# Patient Record
Sex: Female | Born: 1945 | Race: White | Hispanic: No | Marital: Married | Smoking: Former smoker
Health system: Southern US, Community
[De-identification: ages and names within clinical notes are randomized; demographics above are authoritative.]

## PROBLEM LIST (undated history)

## (undated) DIAGNOSIS — A499 Bacterial infection, unspecified: Secondary | ICD-10-CM

## (undated) DIAGNOSIS — K759 Inflammatory liver disease, unspecified: Secondary | ICD-10-CM

## (undated) DIAGNOSIS — Z8619 Personal history of other infectious and parasitic diseases: Secondary | ICD-10-CM

## (undated) DIAGNOSIS — N390 Urinary tract infection, site not specified: Secondary | ICD-10-CM

## (undated) DIAGNOSIS — R519 Headache, unspecified: Secondary | ICD-10-CM

## (undated) DIAGNOSIS — K219 Gastro-esophageal reflux disease without esophagitis: Secondary | ICD-10-CM

## (undated) DIAGNOSIS — J40 Bronchitis, not specified as acute or chronic: Secondary | ICD-10-CM

## (undated) DIAGNOSIS — A389 Scarlet fever, uncomplicated: Secondary | ICD-10-CM

## (undated) DIAGNOSIS — H269 Unspecified cataract: Secondary | ICD-10-CM

## (undated) DIAGNOSIS — Z8719 Personal history of other diseases of the digestive system: Secondary | ICD-10-CM

## (undated) DIAGNOSIS — I10 Essential (primary) hypertension: Secondary | ICD-10-CM

## (undated) DIAGNOSIS — R42 Dizziness and giddiness: Secondary | ICD-10-CM

## (undated) DIAGNOSIS — R51 Headache: Secondary | ICD-10-CM

## (undated) DIAGNOSIS — E785 Hyperlipidemia, unspecified: Secondary | ICD-10-CM

## (undated) DIAGNOSIS — C801 Malignant (primary) neoplasm, unspecified: Secondary | ICD-10-CM

## (undated) DIAGNOSIS — K5792 Diverticulitis of intestine, part unspecified, without perforation or abscess without bleeding: Secondary | ICD-10-CM

## (undated) DIAGNOSIS — N809 Endometriosis, unspecified: Secondary | ICD-10-CM

## (undated) DIAGNOSIS — M1990 Unspecified osteoarthritis, unspecified site: Secondary | ICD-10-CM

## (undated) DIAGNOSIS — H9202 Otalgia, left ear: Secondary | ICD-10-CM

## (undated) DIAGNOSIS — B029 Zoster without complications: Secondary | ICD-10-CM

## (undated) DIAGNOSIS — M25561 Pain in right knee: Secondary | ICD-10-CM

## (undated) DIAGNOSIS — R202 Paresthesia of skin: Secondary | ICD-10-CM

## (undated) DIAGNOSIS — R200 Anesthesia of skin: Secondary | ICD-10-CM

## (undated) DIAGNOSIS — G43909 Migraine, unspecified, not intractable, without status migrainosus: Secondary | ICD-10-CM

## (undated) HISTORY — DX: Personal history of other infectious and parasitic diseases: Z86.19

## (undated) HISTORY — PX: COLON SURGERY: SHX602

## (undated) HISTORY — DX: Unspecified osteoarthritis, unspecified site: M19.90

## (undated) HISTORY — DX: Malignant (primary) neoplasm, unspecified: C80.1

## (undated) HISTORY — DX: Hyperlipidemia, unspecified: E78.5

## (undated) HISTORY — PX: EYE SURGERY: SHX253

## (undated) HISTORY — DX: Diverticulitis of intestine, part unspecified, without perforation or abscess without bleeding: K57.92

## (undated) HISTORY — PX: DIAGNOSTIC LAPAROSCOPY: SUR761

## (undated) HISTORY — PX: TONSILLECTOMY: SHX5217

## (undated) HISTORY — DX: Gastro-esophageal reflux disease without esophagitis: K21.9

## (undated) HISTORY — DX: Endometriosis, unspecified: N80.9

## (undated) HISTORY — PX: APPENDECTOMY: SHX54

## (undated) HISTORY — DX: Zoster without complications: B02.9

## (undated) HISTORY — DX: Pain in right knee: M25.561

## (undated) HISTORY — DX: Dizziness and giddiness: R42

## (undated) HISTORY — PX: CATARACT EXTRACTION W/ INTRAOCULAR LENS IMPLANT: SHX1309

## (undated) HISTORY — PX: OOPHORECTOMY: SHX86

## (undated) HISTORY — PX: TONSILLECTOMY: SUR1361

## (undated) HISTORY — DX: Otalgia, left ear: H92.02

## (undated) HISTORY — DX: Essential (primary) hypertension: I10

---

## 1992-08-25 DIAGNOSIS — N809 Endometriosis, unspecified: Secondary | ICD-10-CM

## 1992-08-25 HISTORY — DX: Endometriosis, unspecified: N80.9

## 1992-08-25 HISTORY — PX: ABDOMINAL HYSTERECTOMY: SHX81

## 1994-08-25 HISTORY — PX: LUNG BIOPSY: SHX232

## 1999-06-17 ENCOUNTER — Other Ambulatory Visit: Admission: RE | Admit: 1999-06-17 | Discharge: 1999-06-17 | Payer: Self-pay | Admitting: Obstetrics & Gynecology

## 2000-11-04 ENCOUNTER — Other Ambulatory Visit: Admission: RE | Admit: 2000-11-04 | Discharge: 2000-11-04 | Payer: Self-pay | Admitting: Obstetrics & Gynecology

## 2001-12-17 ENCOUNTER — Other Ambulatory Visit: Admission: RE | Admit: 2001-12-17 | Discharge: 2001-12-17 | Payer: Self-pay | Admitting: Obstetrics & Gynecology

## 2002-10-25 ENCOUNTER — Encounter: Admission: RE | Admit: 2002-10-25 | Discharge: 2002-10-25 | Payer: Self-pay | Admitting: Family Medicine

## 2002-10-25 ENCOUNTER — Encounter: Payer: Self-pay | Admitting: Family Medicine

## 2003-01-10 ENCOUNTER — Other Ambulatory Visit: Admission: RE | Admit: 2003-01-10 | Discharge: 2003-01-10 | Payer: Self-pay | Admitting: Obstetrics & Gynecology

## 2004-02-01 ENCOUNTER — Other Ambulatory Visit: Admission: RE | Admit: 2004-02-01 | Discharge: 2004-02-01 | Payer: Self-pay | Admitting: Obstetrics & Gynecology

## 2004-08-25 LAB — HM COLONOSCOPY

## 2004-09-19 ENCOUNTER — Ambulatory Visit: Payer: Self-pay | Admitting: Internal Medicine

## 2004-10-29 ENCOUNTER — Ambulatory Visit: Payer: Self-pay | Admitting: Internal Medicine

## 2004-11-13 ENCOUNTER — Ambulatory Visit: Payer: Self-pay | Admitting: Gastroenterology

## 2004-11-23 DIAGNOSIS — K5792 Diverticulitis of intestine, part unspecified, without perforation or abscess without bleeding: Secondary | ICD-10-CM

## 2004-11-23 HISTORY — DX: Diverticulitis of intestine, part unspecified, without perforation or abscess without bleeding: K57.92

## 2004-11-25 ENCOUNTER — Ambulatory Visit: Payer: Self-pay | Admitting: Gastroenterology

## 2004-11-25 ENCOUNTER — Encounter: Payer: Self-pay | Admitting: Internal Medicine

## 2005-12-30 ENCOUNTER — Ambulatory Visit: Payer: Self-pay | Admitting: Internal Medicine

## 2006-02-10 ENCOUNTER — Ambulatory Visit: Payer: Self-pay | Admitting: Internal Medicine

## 2006-04-14 ENCOUNTER — Encounter: Admission: RE | Admit: 2006-04-14 | Discharge: 2006-04-14 | Payer: Self-pay | Admitting: Obstetrics and Gynecology

## 2006-10-20 ENCOUNTER — Ambulatory Visit: Payer: Self-pay | Admitting: Internal Medicine

## 2007-05-05 ENCOUNTER — Encounter: Admission: RE | Admit: 2007-05-05 | Discharge: 2007-05-05 | Payer: Self-pay | Admitting: Obstetrics and Gynecology

## 2007-09-13 ENCOUNTER — Ambulatory Visit: Payer: Self-pay | Admitting: Internal Medicine

## 2007-09-27 ENCOUNTER — Telehealth (INDEPENDENT_AMBULATORY_CARE_PROVIDER_SITE_OTHER): Payer: Self-pay | Admitting: *Deleted

## 2007-09-30 ENCOUNTER — Ambulatory Visit: Payer: Self-pay | Admitting: Cardiology

## 2007-10-04 ENCOUNTER — Telehealth: Payer: Self-pay | Admitting: Internal Medicine

## 2008-05-16 ENCOUNTER — Encounter: Admission: RE | Admit: 2008-05-16 | Discharge: 2008-05-16 | Payer: Self-pay | Admitting: Obstetrics and Gynecology

## 2008-05-18 ENCOUNTER — Other Ambulatory Visit: Admission: RE | Admit: 2008-05-18 | Discharge: 2008-05-18 | Payer: Self-pay | Admitting: Obstetrics and Gynecology

## 2008-12-13 ENCOUNTER — Ambulatory Visit: Payer: Self-pay | Admitting: Family Medicine

## 2009-01-11 ENCOUNTER — Ambulatory Visit: Payer: Self-pay | Admitting: Internal Medicine

## 2009-01-12 ENCOUNTER — Encounter: Payer: Self-pay | Admitting: Internal Medicine

## 2009-01-12 ENCOUNTER — Telehealth: Payer: Self-pay | Admitting: Internal Medicine

## 2009-01-12 LAB — CONVERTED CEMR LAB
Albumin: 4 g/dL (ref 3.5–5.2)
Alkaline Phosphatase: 68 units/L (ref 39–117)
Basophils Absolute: 0 10*3/uL (ref 0.0–0.1)
Basophils Relative: 0.6 % (ref 0.0–3.0)
Bilirubin, Direct: 0.1 mg/dL (ref 0.0–0.3)
Calcium: 9.2 mg/dL (ref 8.4–10.5)
Chloride: 103 meq/L (ref 96–112)
Cholesterol: 214 mg/dL — ABNORMAL HIGH (ref 0–200)
Creatinine, Ser: 0.8 mg/dL (ref 0.4–1.2)
Direct LDL: 160.8 mg/dL
Eosinophils Relative: 1.4 % (ref 0.0–5.0)
GFR calc non Af Amer: 77.04 mL/min (ref >60)
HCT: 40.4 % (ref 36.0–46.0)
MCHC: 34 g/dL (ref 30.0–36.0)
MCV: 94.6 fL (ref 78.0–100.0)
Neutro Abs: 5 10*3/uL (ref 1.4–7.7)
Neutrophils Relative %: 60.8 % (ref 43.0–77.0)
Potassium: 3.9 meq/L (ref 3.5–5.1)
RBC: 4.27 M/uL (ref 3.87–5.11)
Sodium: 141 meq/L (ref 135–145)
Total Bilirubin: 0.7 mg/dL (ref 0.3–1.2)
Total CHOL/HDL Ratio: 6
VLDL: 29.8 mg/dL (ref 0.0–40.0)
WBC: 8.2 10*3/uL (ref 4.5–10.5)

## 2009-01-17 ENCOUNTER — Ambulatory Visit: Payer: Self-pay | Admitting: Gastroenterology

## 2009-01-18 ENCOUNTER — Ambulatory Visit: Payer: Self-pay | Admitting: Internal Medicine

## 2009-01-19 ENCOUNTER — Encounter: Admission: RE | Admit: 2009-01-19 | Discharge: 2009-01-19 | Payer: Self-pay | Admitting: Internal Medicine

## 2009-01-26 ENCOUNTER — Encounter: Admission: RE | Admit: 2009-01-26 | Discharge: 2009-01-26 | Payer: Self-pay | Admitting: Internal Medicine

## 2009-01-30 ENCOUNTER — Ambulatory Visit: Payer: Self-pay | Admitting: Internal Medicine

## 2009-02-01 ENCOUNTER — Telehealth: Payer: Self-pay | Admitting: Internal Medicine

## 2009-02-20 ENCOUNTER — Encounter: Payer: Self-pay | Admitting: Internal Medicine

## 2009-02-22 ENCOUNTER — Encounter: Payer: Self-pay | Admitting: Gastroenterology

## 2009-03-06 ENCOUNTER — Encounter: Payer: Self-pay | Admitting: Internal Medicine

## 2009-04-11 ENCOUNTER — Ambulatory Visit (HOSPITAL_COMMUNITY): Admission: RE | Admit: 2009-04-11 | Discharge: 2009-04-11 | Payer: Self-pay | Admitting: Urology

## 2009-04-12 ENCOUNTER — Encounter: Payer: Self-pay | Admitting: Internal Medicine

## 2009-04-16 ENCOUNTER — Encounter: Payer: Self-pay | Admitting: Gastroenterology

## 2009-04-16 ENCOUNTER — Inpatient Hospital Stay (HOSPITAL_COMMUNITY): Admission: RE | Admit: 2009-04-16 | Discharge: 2009-04-19 | Payer: Self-pay | Admitting: Urology

## 2009-04-16 ENCOUNTER — Encounter: Payer: Self-pay | Admitting: Urology

## 2009-04-16 DIAGNOSIS — C801 Malignant (primary) neoplasm, unspecified: Secondary | ICD-10-CM

## 2009-04-16 HISTORY — PX: NEPHRECTOMY: SHX65

## 2009-04-16 HISTORY — DX: Malignant (primary) neoplasm, unspecified: C80.1

## 2009-04-18 ENCOUNTER — Encounter (INDEPENDENT_AMBULATORY_CARE_PROVIDER_SITE_OTHER): Payer: Self-pay | Admitting: *Deleted

## 2009-04-24 ENCOUNTER — Encounter (INDEPENDENT_AMBULATORY_CARE_PROVIDER_SITE_OTHER): Payer: Self-pay | Admitting: *Deleted

## 2009-04-26 ENCOUNTER — Encounter: Payer: Self-pay | Admitting: Internal Medicine

## 2009-05-03 ENCOUNTER — Encounter: Payer: Self-pay | Admitting: Internal Medicine

## 2009-05-03 ENCOUNTER — Ambulatory Visit (HOSPITAL_COMMUNITY): Admission: RE | Admit: 2009-05-03 | Discharge: 2009-05-03 | Payer: Self-pay | Admitting: Urology

## 2009-05-15 ENCOUNTER — Encounter: Payer: Self-pay | Admitting: Internal Medicine

## 2009-05-25 ENCOUNTER — Encounter: Payer: Self-pay | Admitting: Internal Medicine

## 2009-05-25 ENCOUNTER — Encounter: Payer: Self-pay | Admitting: Gastroenterology

## 2009-06-21 ENCOUNTER — Ambulatory Visit: Payer: Self-pay | Admitting: Gastroenterology

## 2009-06-21 LAB — CONVERTED CEMR LAB
A-1 Antitrypsin, Ser: 176 mg/dL (ref 83–200)
ALT: 276 units/L — ABNORMAL HIGH (ref 0–35)
ANA Titer 1: 1:640 {titer} — ABNORMAL HIGH
AST: 635 units/L — ABNORMAL HIGH (ref 0–37)
Anti Nuclear Antibody(ANA): POSITIVE — AB
Hep B S Ab: NEGATIVE
Hepatitis B Surface Ag: NEGATIVE
Total Bilirubin: 0.8 mg/dL (ref 0.3–1.2)
Total Protein: 8.9 g/dL — ABNORMAL HIGH (ref 6.0–8.3)
Transferrin: 358.2 mg/dL (ref 212.0–360.0)

## 2009-06-25 ENCOUNTER — Encounter: Payer: Self-pay | Admitting: Gastroenterology

## 2009-06-25 LAB — CONVERTED CEMR LAB
Albumin ELP: 43.7 % — ABNORMAL LOW (ref 55.8–66.1)
Alpha-1-Globulin: 6.6 % — ABNORMAL HIGH (ref 2.9–4.9)
Alpha-2-Globulin: 8.8 % (ref 7.1–11.8)
IgG (Immunoglobin G), Serum: 3250 mg/dL — ABNORMAL HIGH (ref 694–1618)

## 2009-06-28 ENCOUNTER — Ambulatory Visit: Payer: Self-pay | Admitting: Gastroenterology

## 2009-06-28 LAB — CONVERTED CEMR LAB
INR: 1.2 — ABNORMAL HIGH (ref 0.8–1.0)
Prothrombin Time: 12.8 s — ABNORMAL HIGH (ref 9.1–11.7)

## 2009-07-05 ENCOUNTER — Ambulatory Visit: Payer: Self-pay | Admitting: Gastroenterology

## 2009-07-26 ENCOUNTER — Encounter (INDEPENDENT_AMBULATORY_CARE_PROVIDER_SITE_OTHER): Payer: Self-pay | Admitting: *Deleted

## 2009-09-13 ENCOUNTER — Telehealth: Payer: Self-pay | Admitting: Gastroenterology

## 2009-09-14 ENCOUNTER — Encounter: Payer: Self-pay | Admitting: Gastroenterology

## 2009-09-18 ENCOUNTER — Telehealth: Payer: Self-pay | Admitting: Gastroenterology

## 2009-10-12 ENCOUNTER — Telehealth: Payer: Self-pay | Admitting: Gastroenterology

## 2009-10-22 ENCOUNTER — Ambulatory Visit: Payer: Self-pay | Admitting: Internal Medicine

## 2009-10-23 LAB — CONVERTED CEMR LAB
AST: 79 units/L — ABNORMAL HIGH (ref 0–37)
Basophils Relative: 0.1 % (ref 0.0–3.0)
Bilirubin, Direct: 0.2 mg/dL (ref 0.0–0.3)
Eosinophils Absolute: 0.1 10*3/uL (ref 0.0–0.7)
Eosinophils Relative: 0.7 % (ref 0.0–5.0)
HCT: 36.2 % (ref 36.0–46.0)
Hemoglobin: 12.1 g/dL (ref 12.0–15.0)
Lymphocytes Relative: 14.3 % (ref 12.0–46.0)
Lymphs Abs: 1.4 10*3/uL (ref 0.7–4.0)
MCHC: 33.4 g/dL (ref 30.0–36.0)
Monocytes Absolute: 1 10*3/uL (ref 0.1–1.0)
Monocytes Relative: 10.6 % (ref 3.0–12.0)
Neutrophils Relative %: 74.3 % (ref 43.0–77.0)
Total Protein: 8 g/dL (ref 6.0–8.3)

## 2009-11-08 ENCOUNTER — Encounter: Admission: RE | Admit: 2009-11-08 | Discharge: 2009-11-08 | Payer: Self-pay | Admitting: Obstetrics and Gynecology

## 2009-11-22 ENCOUNTER — Encounter: Admission: RE | Admit: 2009-11-22 | Discharge: 2009-11-22 | Payer: Self-pay | Admitting: Obstetrics and Gynecology

## 2009-11-23 ENCOUNTER — Ambulatory Visit: Payer: Self-pay | Admitting: Gastroenterology

## 2009-11-23 ENCOUNTER — Ambulatory Visit (HOSPITAL_COMMUNITY): Admission: RE | Admit: 2009-11-23 | Discharge: 2009-11-23 | Payer: Self-pay | Admitting: Gastroenterology

## 2009-11-27 ENCOUNTER — Telehealth: Payer: Self-pay | Admitting: Gastroenterology

## 2009-12-03 ENCOUNTER — Ambulatory Visit: Payer: Self-pay | Admitting: Gastroenterology

## 2009-12-04 ENCOUNTER — Encounter: Payer: Self-pay | Admitting: Internal Medicine

## 2009-12-04 ENCOUNTER — Ambulatory Visit (HOSPITAL_COMMUNITY): Admission: RE | Admit: 2009-12-04 | Discharge: 2009-12-04 | Payer: Self-pay | Admitting: Urology

## 2010-01-11 ENCOUNTER — Encounter: Payer: Self-pay | Admitting: Internal Medicine

## 2010-01-23 ENCOUNTER — Encounter: Payer: Self-pay | Admitting: Gastroenterology

## 2010-03-14 ENCOUNTER — Encounter: Payer: Self-pay | Admitting: Internal Medicine

## 2010-03-15 ENCOUNTER — Ambulatory Visit: Payer: Self-pay | Admitting: Internal Medicine

## 2010-04-17 ENCOUNTER — Encounter: Payer: Self-pay | Admitting: Internal Medicine

## 2010-05-27 ENCOUNTER — Telehealth: Payer: Self-pay | Admitting: Gastroenterology

## 2010-06-03 ENCOUNTER — Encounter: Payer: Self-pay | Admitting: Gastroenterology

## 2010-06-04 ENCOUNTER — Encounter: Payer: Self-pay | Admitting: Internal Medicine

## 2010-06-07 ENCOUNTER — Telehealth: Payer: Self-pay | Admitting: Gastroenterology

## 2010-06-11 ENCOUNTER — Telehealth: Payer: Self-pay | Admitting: Gastroenterology

## 2010-06-13 ENCOUNTER — Ambulatory Visit: Payer: Self-pay | Admitting: Gastroenterology

## 2010-06-13 LAB — CONVERTED CEMR LAB
Basophils Relative: 0.5 % (ref 0.0–3.0)
CO2: 29 meq/L (ref 19–32)
Calcium: 8.8 mg/dL (ref 8.4–10.5)
Creatinine, Ser: 0.7 mg/dL (ref 0.4–1.2)
HCT: 38.7 % (ref 36.0–46.0)
Hemoglobin: 13.1 g/dL (ref 12.0–15.0)
INR: 1 (ref 0.8–1.0)
Lymphocytes Relative: 28.5 % (ref 12.0–46.0)
Lymphs Abs: 2.3 10*3/uL (ref 0.7–4.0)
Monocytes Absolute: 0.9 10*3/uL (ref 0.1–1.0)
Monocytes Relative: 11.1 % (ref 3.0–12.0)
Neutro Abs: 4.7 10*3/uL (ref 1.4–7.7)
Potassium: 4.7 meq/L (ref 3.5–5.1)
RBC: 4.13 M/uL (ref 3.87–5.11)
Sodium: 140 meq/L (ref 135–145)
Total Bilirubin: 0.7 mg/dL (ref 0.3–1.2)
Total Protein, Serum Electrophoresis: 7.7 g/dL (ref 6.0–8.3)
Total Protein: 7.4 g/dL (ref 6.0–8.3)
WBC: 7.9 10*3/uL (ref 4.5–10.5)
aPTT: 31 s — ABNORMAL HIGH

## 2010-06-19 ENCOUNTER — Telehealth: Payer: Self-pay | Admitting: Gastroenterology

## 2010-06-26 ENCOUNTER — Ambulatory Visit: Payer: Self-pay | Admitting: Internal Medicine

## 2010-06-26 ENCOUNTER — Telehealth: Payer: Self-pay | Admitting: Gastroenterology

## 2010-07-30 ENCOUNTER — Telehealth: Payer: Self-pay | Admitting: Gastroenterology

## 2010-08-06 ENCOUNTER — Encounter: Payer: Self-pay | Admitting: Gastroenterology

## 2010-08-06 ENCOUNTER — Encounter: Payer: Self-pay | Admitting: Internal Medicine

## 2010-08-29 ENCOUNTER — Encounter: Payer: Self-pay | Admitting: Internal Medicine

## 2010-09-15 ENCOUNTER — Encounter: Payer: Self-pay | Admitting: Obstetrics and Gynecology

## 2010-09-15 ENCOUNTER — Encounter: Payer: Self-pay | Admitting: Gastroenterology

## 2010-09-19 ENCOUNTER — Telehealth: Payer: Self-pay | Admitting: Gastroenterology

## 2010-09-19 ENCOUNTER — Ambulatory Visit (HOSPITAL_COMMUNITY)
Admission: RE | Admit: 2010-09-19 | Discharge: 2010-09-19 | Payer: Self-pay | Source: Home / Self Care | Attending: Urology | Admitting: Urology

## 2010-09-24 ENCOUNTER — Encounter: Payer: Self-pay | Admitting: Gastroenterology

## 2010-09-26 NOTE — Progress Notes (Signed)
Summary: Pt. Rescheduled Liver Biopsy   Phone Note Call from Patient Call back at [phone]   Caller: Patient Call For: Dr. Arlyce Dice Reason for Call: Talk to Nurse Summary of Call: Pt. has an appt. for a liver biopsy at [facility] on Tuesday and has the flu. She said she may need to r/s appt. Initial call taken by: Karna Christmas,  October 12, 2009 10:42 AM  Follow-up for Phone Call        Pt. rescheudled Liver Bx. to 11-14-09 at 7:45am at [facility]. All instructions updated with her by phone. Pt. instructed to call back as needed.  Follow-up by: Laureen Ochs LPN,  October 12, 2009 11:11 AM  Additional Follow-up for Phone Call Additional follow up Details #1::        ok Additional Follow-up by: Louis Meckel MD,  October 12, 2009 3:26 PM

## 2010-09-26 NOTE — Progress Notes (Signed)
Summary: Lab work   Phone Note Call from Patient Call back at Work Phone [phone]   Caller: Patient Call For: Dr. Arlyce Dice Reason for Call: Talk to Nurse Summary of Call: Asking to speak w/CMA about Lab work Initial call taken by: Karna Christmas,  June 11, 2010 2:36 PM  Follow-up for Phone Call        Tried to return pts call, No answer Follow-up by: Merri Ray CMA Duncan Dull),  June 11, 2010 3:10 PM  Additional Follow-up for Phone Call Additional follow up Details #1::        Returned pts call, and also give her the information that Dr Arlyce Dice advised. She stated she already had all those labs drawn and would bring a copy to her appointment. Scheduled her to see Dr Arlyce Dice, tomorrow on 06/13/2010 at 10:15am. Has alot of questions about her test results.  Will talk with Dr Arlyce Dice tomorrow Additional Follow-up by: Merri Ray CMA Duncan Dull),  June 12, 2010 9:41 AM    Additional Follow-up for Phone Call Additional follow up Details #2::    Dr Arlyce Dice, Lorain Childes Follow-up by: Merri Ray CMA Duncan Dull),  June 12, 2010 9:41 AM  Additional Follow-up for Phone Call Additional follow up Details #3:: Details for Additional Follow-up Action Taken: ok Additional Follow-up by: Louis Meckel MD,  June 12, 2010 11:13 AM

## 2010-09-26 NOTE — Progress Notes (Signed)
Summary: Biopsy results   Phone Note Call from Patient Call back at Work Phone [phone]   Caller: Patient Call For: Dr. Arlyce Dice Reason for Call: Lab or Test Results Summary of Call: calling about biopsy results Initial call taken by: Karna Christmas,  November 27, 2009 4:26 PM  Follow-up for Phone Call        Pt. made aware biopsy not back yet.  Teryl Lucy RN  November 27, 2009 4:38 PM   Pt. is aware her Liver bx. report isn't complete, I did call Pathology to verify. Pt. will see Dr.Kaplan on 12-03-09 at 8:30am, report will be reviewed with pt. at that time. Pt. instructed to call back as needed.  Follow-up by: Laureen Ochs LPN,  November 28, 2009 10:13 AM

## 2010-09-26 NOTE — Progress Notes (Signed)
Summary: Nexium  Medications Added NEXIUM 40 MG CPDR (ESOMEPRAZOLE MAGNESIUM) 1 by mouth once daily       Phone Note Call from Patient Call back at [phone]  (cell)   Caller: Patient Call For: Dr. Arlyce Dice Reason for Call: Talk to Nurse Summary of Call: pt would like rx for Nexium... samples have helped... [facility] [address] Initial call taken by: Vallarie Mare,  September 18, 2009 1:16 PM  Follow-up for Phone Call        called pt to inform sent in Nexium, no answer, [facility] Follow-up by: Merri Ray CMA Duncan Dull),  September 18, 2009 1:35 PM    New/Updated Medications: NEXIUM 40 MG CPDR (ESOMEPRAZOLE MAGNESIUM) 1 by mouth once daily Prescriptions: NEXIUM 40 MG CPDR (ESOMEPRAZOLE MAGNESIUM) 1 by mouth once daily  #30 x 6   Entered by:   Merri Ray CMA (AAMA)   Authorized by:   Louis Meckel MD   Signed by:   Merri Ray CMA (AAMA) on 09/18/2009   Method used:   [facility]  [address] [phone][facility] (retail)       [address]       [place], [place]  [id number]       Ph: [phone] or [phone]       Fax: [phone]   RxID:   [id number]

## 2010-09-26 NOTE — Letter (Signed)
Summary: [facility]   Imported By: Sherian Rein 05/03/2010 13:54:29  _____________________________________________________________________  External Attachment:    Type:   Image     Comment:   External Document

## 2010-09-26 NOTE — Progress Notes (Signed)
Summary: Liver Biopsy Scheduled   Phone Note Call from Patient Call back at [phone]   Caller: Patient Call For: Dr. Arlyce Dice Reason for Call: Talk to Nurse Summary of Call: pt says Dr. Arlyce Dice instructed her to call [facility] Pound to sch a liver biopsy Initial call taken by: Vallarie Mare,  September 13, 2009 3:24 PM  Follow-up for Phone Call        Pt. has scheduled the Liver biopsy for 10-16-09 at 7:45am at [facility]. All instructions reviewed with her by phone and mailed to her. Pt. instructed to call back as needed.  Follow-up by: Laureen Ochs LPN,  September 13, 2009 3:52 PM

## 2010-09-26 NOTE — Medication Information (Signed)
Summary: Approved/medco  Approved/[facility]   Imported By: Lester Hilltop 01/29/2010 08:03:48  _____________________________________________________________________  External Attachment:    Type:   Image     Comment:   External Document

## 2010-09-26 NOTE — Progress Notes (Signed)
Summary: Questions   Phone Note Call from Patient Call back at Work Phone [phone]   Call For: Dr Arlyce Dice Reason for Call: Talk to Nurse Summary of Call: Has some questions bout her referral to [facility].  Initial call taken by: Leanor Kail [facility],  July 30, 2010 9:17 AM  Follow-up for Phone Call        Spoke with patient, she wanted to make sure that her records had been sent to [facility]. Informed patient that her records had been faxed to [facility] for her appointment. Follow-up by: Selinda Michaels RN,  July 30, 2010 9:33 AM

## 2010-09-26 NOTE — Letter (Signed)
Summary: [facility]  [facility]   Imported By: Maryln Gottron 02/06/2010 14:55:33  _____________________________________________________________________  External Attachment:    Type:   Image     Comment:   External Document

## 2010-09-26 NOTE — Assessment & Plan Note (Signed)
Summary: F/U ON LIVER BIOPSY               Brenda [name]    History of Present Illness Visit Type: Follow-up Visit Primary GI MD: Melvia Heaps MD [facility] Primary Provider: Birdie Sons MD Requesting Provider: n/a Chief Complaint: Liver biopsy History of Present Illness:   Brenda [name] has returned following percutaneous liver biopsy.  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  There is no scarring or other permanent changes. She has no complaints.   GI Review of Systems      Denies abdominal pain, acid reflux, belching, bloating, chest pain, dysphagia with liquids, dysphagia with solids, heartburn, loss of appetite, nausea, vomiting, vomiting blood, weight loss, and  weight gain.        Denies anal fissure, black tarry stools, change in bowel habit, constipation, diarrhea, diverticulosis, fecal incontinence, heme positive stool, hemorrhoids, irritable bowel syndrome, jaundice, light color stool, liver problems, rectal bleeding, and  rectal pain.    Current Medications (verified): 1)  Vivelle-Dot 0.075 Mg/24hr Pttw (Estradiol) .... As Directed 2)  Vitamin D (Ergocalciferol) 50000 Unit Caps (Ergocalciferol) .Marland Kitchen.. 1 Per Week 3)  Aspirin Adult Low Strength 81 Mg Tbec (Aspirin) .... One Tab Two Times A Day 4)  Gnp One Daily Womens 50+  Tabs (Multiple Vitamins-Minerals) .... Once Daily 5)  Oscal 500/200 D-3 500-200 Mg-Unit Tabs (Calcium-Vitamin D) .... Once Daily 6)  Vagifem 10 Mcg Tabs (Estradiol) .... As Directed 7)  Nexium 40 Mg Cpdr (Esomeprazole Magnesium) .Marland Kitchen.. 1 By Mouth Once Daily  Allergies (verified): 1)  ! Macrobid 2)  ! Codeine  Past History:  Past Medical History: Reviewed history from 01/17/2009 and no changes required. arthritis Bursitis Hyperlipidemia  Past Surgical History: Reviewed history from 06/21/2009 and no changes required. Hysterectomy Oophorectomy Tonsillectomy Nephrectomy -1/3 removed  Family History: Reviewed history from 07/05/2009  and no changes required. mother--90 living (hx of thyroid ca) father---deceased 79 yo "blood cancer" brother deceased MVA No FH of Colon Cancer:  Social History: Reviewed history from 06/21/2009 and no changes required. Former Smoker-quit 1986 Alcohol Use - yes rare Illicit Drug Use - no Patient gets regular exercise. Occupation: Retired  Review of Systems       The patient complains of allergy/sinus, arthritis/joint pain, and sore throat.  The patient denies anemia, anxiety-new, back pain, blood in urine, breast changes/lumps, change in vision, confusion, cough, coughing up blood, depression-new, fainting, fatigue, fever, headaches-new, hearing problems, heart murmur, heart rhythm changes, itching, menstrual pain, muscle pains/cramps, night sweats, nosebleeds, pregnancy symptoms, shortness of breath, skin rash, sleeping problems, swelling of feet/legs, swollen lymph glands, thirst - excessive , urination - excessive , urination changes/pain, urine leakage, vision changes, and voice change.    Vital Signs:  Patient profile:   65 year old female Height:      63 inches Weight:      157 pounds BMI:     27.91 Pulse rate:   72 / minute Pulse rhythm:   regular BP sitting:   116 / 70  (left arm) Cuff size:   regular  Vitals Entered By: June McMurray CMA Duncan Dull) (December 03, 2009 8:30 AM)   Impression & Recommendations:  Problem # 1:  NONSPECIFIC ABNORMAL RESULTS LIVR FUNCTION STUDY (ICD-794.8) She has a nonspecific mild chronic hepatitis.  Etiology has not been determined.  Most importantly, it  is not causing permanent liver damage.  No further workup is required.  Recommendations #1 repeat LFTs in 6 and 12 months.  She will  do that at Dr. Cato Mulligan office  Patient Instructions: 1)  You are due for follow up Liver Function Test in 6 Months, we will call you to remind you. 2)  Copy sent to : Birdie Sons MD 3)  The medication list was reviewed and reconciled.  All changed / newly  prescribed medications were explained.  A complete medication list was provided to the patient / caregiver.

## 2010-09-26 NOTE — Letter (Signed)
Summary: [facility]  [facility]   Imported By: Maryln Gottron 12/12/2009 13:14:42  _____________________________________________________________________  External Attachment:    Type:   Image     Comment:   External Document

## 2010-09-26 NOTE — Assessment & Plan Note (Signed)
Summary: acute per PA urology/et   Vital Signs:  Patient profile:   65 year old female Weight:      163 pounds Temp:     98.2 degrees F oral Pulse rate:   84 / minute Pulse rhythm:   regular Resp:     14 per minute BP sitting:   136 / 84  (left arm) Cuff size:   regular  Vitals Entered By: Gladis Riffle, RN (March 15, 2010 8:21 AM) CC: sent by urology PA--was on sulfa for UTI but got joint pain--changed to cephalexin and joint pain some better but began swelling feet and ankles 03/01/10 Is Patient Diabetic? No Comments urine clear yesterday, some labs done   Primary Care Provider:  Birdie Sons MD  CC:  sent by urology PA--was on sulfa for UTI but got joint pain--changed to cephalexin and joint pain some better but began swelling feet and ankles 03/01/10.  History of Present Illness: complicated interesting story as follows: 08/25/09 developed chills. temp 102.6 the following day---thought to be kidney infection. GYN called in septra. 2-3 days later developed joints and muscle aches. stopped septra, started keflex 2 days later feet swelling (no pain). Joint pains 9wrist, knees, hands are better but she continues to have hand pain to th epoint that making a fist is difficult.  saw a urologist 03/14/10, labs drawn---see results. 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 As Directed 2)  Vitamin D  (Ergocalciferol) 50000 Unit Caps (Ergocalciferol) .Marland Kitchen.. 1 Per Month 3)  Aspirin Adult Low Strength 81 Mg Tbec (Aspirin) .... Once Daily 4)  Gnp One Daily Womens 50+  Tabs (Multiple Vitamins-Minerals) .... Once Daily 5)  Oscal 500/200 D-3 500-200 Mg-Unit Tabs (Calcium-Vitamin D) .... Once Daily 6)  Vagifem 10 Mcg Tabs (Estradiol) .... As Directed 7)  Nexium 40 Mg Cpdr (Esomeprazole Magnesium) .Marland Kitchen.. 1 By Mouth Once Daily  Allergies: 1)  ! Macrobid 2)  ! Codeine  Past History:  Past Medical History: Last updated: 01/17/2009 arthritis Bursitis Hyperlipidemia  Past Surgical History: Last updated: 06/21/2009 Hysterectomy Oophorectomy Tonsillectomy Nephrectomy -1/3 removed  Family History: Last updated: 07/05/2009 mother--90 living (hx of thyroid ca) father---deceased 75 yo "blood cancer" brother deceased MVA No FH of Colon Cancer:  Social History: Last updated: 06/21/2009 Former Smoker-quit 1986 Alcohol Use - yes rare Illicit Drug Use - no Patient gets regular exercise. Occupation: Retired  Risk Factors: Exercise: yes (01/17/2009)  Risk Factors: Smoking Status: quit > 6 months (03/15/2010) Packs/Day: 1.0 (03/15/2010)  Physical Exam  General:  Well-developed,well-nourished,in no acute distress; alert,appropriate and cooperative throughout examination Head:  normocephalic and atraumatic.   Eyes:  pupils equal and pupils round.   Ears:  R ear normal and L ear normal.   Neck:  supple and full ROM.   Lungs:  normal respiratory effort and no intercostal retractions.   Heart:  normal rate and regular rhythm.   Msk:  FROM wrists,  knees, elbows Extremities:  1 + edema of feet bilaterally Neurologic:  alert & oriented X3 and cranial nerves II-XII intact.     Impression & Recommendations:  Problem # 1:  MYALGIA (ICD-729.1) interesting story of fever followed by muscle and joint aches now with some swelling of feet  i suspect all related to original febrile illness and  expect sxs to resolve sxs could be related to antibiotic reaction---but i doubt it. we will call her Wed to check on her  labs reviewed Her updated medication list for this problem includes:    Aspirin Adult Low Strength 81 Mg Tbec (Aspirin) ..... Once daily  Complete Medication List: 1)  Vivelle-dot 0.075 Mg/24hr Pttw (Estradiol) .... As directed 2)  Vitamin D (ergocalciferol) 50000 Unit Caps (Ergocalciferol) .Marland Kitchen.. 1 per month 3)  Aspirin Adult Low Strength 81 Mg Tbec (Aspirin) .... Once daily 4)  Gnp One Daily Womens 50+ Tabs (Multiple vitamins-minerals) .... Once daily 5)  Oscal 500/200 D-3 500-200 Mg-unit Tabs (Calcium-vitamin d) .... Once daily 6)  Vagifem 10 Mcg Tabs (Estradiol) .... As directed 7)  Nexium 40 Mg Cpdr (Esomeprazole magnesium) .Marland Kitchen.. 1 by mouth once daily 8)  Lidocaine Hcl 2 % Gel (Lidocaine hcl) .... Apply to hands two times a day as needed hand pain Prescriptions: LIDOCAINE HCL 2 % GEL (LIDOCAINE HCL) apply to hands two times a day as needed hand pain  #60 grams x 0   Entered and Authorized by:   Birdie Sons MD   Signed by:   Birdie Sons MD on 03/15/2010   Method used:   [facility]  [address] [phone][facility] (retail)       [address]       [place], [place]  [id number]       Ph: [phone] or [phone]       Fax: [phone]   RxID:   [id number]

## 2010-09-26 NOTE — Letter (Signed)
Summary: Lab Reminder  [facility]  [address] [place], [place] [address]   Phone: [phone]  Fax: [phone]        June 03, 2010 MRN: [id number]    [facility] [address] [place], [place]  [address]    Dear [name],   We have been unable to reach you by phone,You need to follow up with   your labwork that is now due. The orders are already in our system at   the lab which is located at [address] In our basement.  If you have any questions please contact me at [phone].       Thank You,    Dionisio Paschal AAMA

## 2010-09-26 NOTE — Letter (Signed)
Summary: EGD Instructions  [facility]  [address] [place], [place] [address]   Phone: [phone]  Fax: [phone]       Brenda [name]    29-May-1946    MRN: [id number]       Procedure Day /Date:   Tuesday February 22nd, 2011     Arrival Time:  6:45am     Procedure Time: 7:45am     Location of Procedure:   [facility] ( Outpatient Registration)    PREPARATION FOR LIVER BIOPSY   ON THE DAY OF THE PROCEDURE:     Tuesday February 22nd, 2011  1.   No solid foods, milk or milk products are allowed after midnight the night before your procedure.  2.   Do not drink anything colored red or purple.  Avoid juices with pulp.  No orange juice.  3.  You may drink clear liquids until 3:45am, which is 4 hours before your procedure.                                                                                                CLEAR LIQUIDS INCLUDE: Water Jello Ice Popsicles Tea (sugar ok, no milk/cream) Powdered fruit flavored drinks Coffee (sugar ok, no milk/cream) Gatorade Juice: apple, white grape, white cranberry  Lemonade Clear bullion, consomm, broth Carbonated beverages (any kind) Strained chicken noodle soup Hard Candy   MEDICATION INSTRUCTIONS  Unless otherwise instructed, you should take regular prescription medications with a small sip of water as early as possible the morning of your procedure.  **No aspirin or Ibuprofen products after 10-02-2009. Tylenol products are O.K.             OTHER INSTRUCTIONS  You will need a responsible adult at least 65 years of age to accompany you and drive you home.   This person must remain in the waiting room during your procedure. They can leave after that and return later in the day to take you home.  Wear loose fitting clothing that is easily removed.  Leave jewelry and other valuables at home.  However, you may wish to bring a book to read or an iPod/MP3 player to listen to music as you wait  for your procedure to start.  Remove all body piercing jewelry and leave at home.  Total time from sign-in until discharge is approximately 6-8 hours.  You should go home directly after your procedure and rest.  You can resume normal activities the day after your procedure.  The day of your procedure you should not:   Drive   Make legal decisions   Operate machinery   Drink alcohol   Return to work  You will receive specific instructions about eating, activities and medications before you leave.    The above instructions have been reviewed and explained to Brenda [name] by phone and mailed to her by Laureen Ochs LPN  September 14, 2009 12:20 PM      Appended Document: EGD Instructions Letter mailed to patient.

## 2010-09-26 NOTE — Medication Information (Signed)
Summary: Prior Auth/[employer]  Prior Auth/[employer]   Imported By: Lester Vadito 01/29/2010 08:05:24  _____________________________________________________________________  External Attachment:    Type:   Image     Comment:   External Document

## 2010-09-26 NOTE — Letter (Signed)
Summary: [facility]  [facility]   Imported By: Maryln Gottron 04/04/2010 10:43:02  _____________________________________________________________________  External Attachment:    Type:   Image     Comment:   External Document

## 2010-09-26 NOTE — Assessment & Plan Note (Signed)
Summary: ? BRONCHITIS//CCM   Vital Signs:  Patient profile:   65 year old female Temp:     98.4 degrees F oral Pulse rate:   88 / minute Pulse rhythm:   regular Resp:     14 per minute BP sitting:   116 / 78  (left arm)  Vitals Entered By: Gladis Riffle, RN (October 22, 2009 11:23 AM) CC: c/o cough--began with chills, fever, aches last Wed 2/16--got better but cough has returned with facial pain Is Patient Diabetic? No   Primary Care Provider:  Birdie Sons MD  CC:  c/o cough--began with chills, fever, and aches last Wed 2/16--got better but cough has returned with facial pain.  History of Present Illness: URI sxs cough is the main symptom for 12 days initially started with chills and then developed cough after temporary reprieve of sxs cough productive of yellow mucous cough can cause some chest pain  All other systems reviewed and were negative   Preventive Screening-Counseling & Management  Alcohol-Tobacco     Smoking Status: quit > 6 months     Packs/Day: 1.0  Current Problems (verified): 1)  Carcinoma, Renal Cell  (ICD-189.0) 2)  Dysphagia Unspecified  (ICD-787.20) 3)  Diverticulosis of Colon  (ICD-562.10) 4)  Nonspecific Abnormal Results Livr Function Study  (ICD-794.8) 5)  Preventive Health Care  (ICD-V70.0) 6)  Gerd  (ICD-530.81)  Current Medications (verified): 1)  Vivelle-Dot 0.075 Mg/24hr Pttw (Estradiol) .... As Directed 2)  Vitamin D (Ergocalciferol) 50000 Unit Caps (Ergocalciferol) .Marland Kitchen.. 1 Per Week 3)  Aspirin Adult Low Strength 81 Mg Tbec (Aspirin) .... One Tab Two Times A Day 4)  Gnp One Daily Womens 50+  Tabs (Multiple Vitamins-Minerals) .... Once Daily 5)  Oscal 500/200 D-3 500-200 Mg-Unit Tabs (Calcium-Vitamin D) .... Once Daily 6)  Vagifem 10 Mcg Tabs (Estradiol) .... As Directed 7)  Nexium 40 Mg Cpdr (Esomeprazole Magnesium) .Marland Kitchen.. 1 By Mouth Once Daily  Allergies: 1)  ! Macrobid 2)  ! Codeine  Past History:  Past Medical History: Last  updated: 01/17/2009 arthritis Bursitis Hyperlipidemia  Past Surgical History: Last updated: 06/21/2009 Hysterectomy Oophorectomy Tonsillectomy Nephrectomy -1/3 removed  Family History: Last updated: 07/05/2009 mother--90 living (hx of thyroid ca) father---deceased 86 yo "blood cancer" brother deceased MVA No FH of Colon Cancer:  Social History: Last updated: 06/21/2009 Former Smoker-quit 1986 Alcohol Use - yes rare Illicit Drug Use - no Patient gets regular exercise. Occupation: Retired  Risk Factors: Exercise: yes (01/17/2009)  Risk Factors: Smoking Status: quit > 6 months (10/22/2009) Packs/Day: 1.0 (10/22/2009)  Physical Exam  General:  Well-developed,well-nourished,in no acute distress; alert,appropriate and cooperative throughout examination Head:  normocephalic and atraumatic.   Eyes:  pupils equal and pupils round.   Neck:  supple and full ROM.   Chest Wall:  No deformities, masses, or tenderness noted. Lungs:  normal respiratory effort and no intercostal retractions.   Heart:  normal rate and regular rhythm.   Abdomen:  soft and non-tender.   Msk:  No deformity or scoliosis noted of thoracic or lumbar spine.   FROM both hips and knees , no pain to palpation Neurologic:  cranial nerves II-XII intact and gait normal.     Impression & Recommendations:  Problem # 1:  ACUTE BRONCHITIS (ICD-466.0)  trial ABX side effects discussed call for any other worsening.  The following medications were removed from the medication list:    Trimethoprim 100 Mg Tabs (Trimethoprim) .Marland Kitchen... As needed Her updated medication list for this problem includes:  Doxycycline Hyclate 100 Mg Caps (Doxycycline hyclate) .Marland Kitchen... Take 1 tab twice a day  Orders: Venipuncture ([address]) TLB-CBC Platelet - w/Differential (85025-CBCD) TLB-Hepatic/Liver Function Pnl (80076-HEPATIC) TLB-CK Total Only(Creatine Kinase/CPK) (82550-CK) Sedimentation Rate, non-automated ([address])  Problem # 2:   LEG PAIN, BILATERAL (ICD-729.5) she complains of nocturanal hip pain--- unclear etiology has seen dr Darrelyn Hillock  I don't think this is OA could be soft tissue inflammation given ongoing evaluation of elevated lfts I have cautioned her against tylenol and NSAIDs will check labs  Complete Medication List: 1)  Vivelle-dot 0.075 Mg/24hr Pttw (Estradiol) .... As directed 2)  Vitamin D (ergocalciferol) 50000 Unit Caps (Ergocalciferol) .Marland Kitchen.. 1 per week 3)  Aspirin Adult Low Strength 81 Mg Tbec (Aspirin) .... One tab two times a day 4)  Gnp One Daily Womens 50+ Tabs (Multiple vitamins-minerals) .... Once daily 5)  Oscal 500/200 D-3 500-200 Mg-unit Tabs (Calcium-vitamin d) .... Once daily 6)  Vagifem 10 Mcg Tabs (Estradiol) .... As directed 7)  Nexium 40 Mg Cpdr (Esomeprazole magnesium) .Marland Kitchen.. 1 by mouth once daily 8)  Doxycycline Hyclate 100 Mg Caps (Doxycycline hyclate) .... Take 1 tab twice a day Prescriptions: DOXYCYCLINE HYCLATE 100 MG CAPS (DOXYCYCLINE HYCLATE) Take 1 tab twice a day  #20 x 0   Entered and Authorized by:   Birdie Sons MD   Signed by:   Birdie Sons MD on 10/22/2009   Method used:   [facility]  [address] [phone][facility] (retail[facility]       [address]       [place], [place]  [id number]       Ph: [phone] or [phone]       Fax: [phone]   RxID:   [id number]   Appended Document: Orders Update     Clinical Lists Changes  Observations: Added new observation of COMMENTS2: Wynona Canes, CMA  October 22, 2009 12:54 PM  (10/22/2009 12:54)      Laboratory Results   Blood Tests   Date/Time Recieved: October 22, 2009 12:54 PM  Date/Time Reported: October 22, 2009 12:54 PM   SED rate: 58  Comments: Wynona Canes, CMA  October 22, 2009 12:54 PM     Appended Document: ? BRONCHITIS//CCM call patient. muscle lab work ok she does have one marker of inflammation that is mildly-moderately  elevated I'd like to repeat in 3 weeks ESR and CRP in 3 weeks  Appended Document: ? BRONCHITIS//CCM Patient notified. Lab appt made for 3/25

## 2010-09-26 NOTE — Progress Notes (Signed)
Summary: Lab results   Phone Note Call from Patient Call back at Work Phone [phone]   Caller: Patient Call For: Dr. Arlyce Dice Reason for Call: Lab or Test Results Summary of Call: Calling about Lab results Initial call taken by: Karna Christmas,  June 19, 2010 2:30 PM  Follow-up for Phone Call        Dr Arlyce Dice please advise on her labs Follow-up by: Merri Ray CMA Duncan Dull),  June 19, 2010 2:48 PM  Additional Follow-up for Phone Call Additional follow up Details #1::        minimal liver enzyme elevation, otherwise unremarkable Additional Follow-up by: Louis Meckel MD,  June 19, 2010 4:37 PM    Additional Follow-up for Phone Call Additional follow up Details #2::    pt aware Follow-up by: Chales Abrahams CMA Duncan Dull),  June 19, 2010 4:50 PM

## 2010-09-26 NOTE — Progress Notes (Signed)
Summary: Pt ready for referral to [facility]  Medications Added NEXIUM 40 MG CPDR (ESOMEPRAZOLE MAGNESIUM) 1 by mouth once daily       Phone Note Call from Patient Call back at Work Phone [phone]   Caller: Patient Call For: Dr. Arlyce Dice Reason for Call: Talk to Nurse Summary of Call: pt would like to sch an appt with [facility] and specifically asked to speak with Robin Initial call taken by: Vallarie Mare,  June 26, 2010 3:33 PM  Follow-up for Phone Call        Pt had decided to let us do the referral to Corey Skains at [facility]. I explained to pt I would fax over all her information and [facility] would be contacting her to scheduled her appointment, Pt also stated she needs more refills on Nexium.  Follow-up by: Merri Ray CMA Duncan Dull),  June 26, 2010 3:52 PM  Additional Follow-up for Phone Call Additional follow up Details #1::        Contacted Dr Gaynelle Arabian office this [phone] Additional Follow-up by: Merri Ray CMA Duncan Dull),  June 27, 2010 8:34 AM    Additional Follow-up for Phone Call Additional follow up Details #2::    Faxed over all records this morning Follow-up by: Merri Ray CMA Duncan Dull),  June 27, 2010 10:59 AM  Additional Follow-up for Phone Call Additional follow up Details #3:: Details for Additional Follow-up Action Taken: Appointment has been scheduled for 07/07/2010 at 9:30am with Dr Corey Skains. Pt aware Additional Follow-up by: Merri Ray CMA Duncan Dull),  July 02, 2010 9:38 AM  New/Updated Medications: NEXIUM 40 MG CPDR (ESOMEPRAZOLE MAGNESIUM) 1 by mouth once daily Prescriptions: NEXIUM 40 MG CPDR (ESOMEPRAZOLE MAGNESIUM) 1 by mouth once daily  #30 x 11   Entered by:   Merri Ray CMA (AAMA)   Authorized by:   Louis Meckel MD   Signed by:   Merri Ray CMA (AAMA) on 06/26/2010   Method used:   [facility]  [address] [phone][facility] (retail)       [address]       [place], [place]  [id number]       Ph: [phone] or [phone]       Fax: [phone]   RxID:   [id number]   Appended Document: Orders Update    Clinical Lists Changes  Orders: Added new Test order of [facility], maintenance (IT)) - Signed

## 2010-09-26 NOTE — Assessment & Plan Note (Signed)
Summary: PT TO FOLLOW UP FROM CT PER DR Rashiya Lofland   History of Present Illness Visit Type: Follow-up Visit Primary GI MD: Melvia Heaps MD [facility] Primary Provider: Birdie Sons MD Requesting Provider: n/a Chief Complaint: pt denies any problems..states she was told to have labs and to come in to see [place] History of Present Illness:   Brenda [name] has returned for followup of her liver test abnormalities.  She underwent percutaneous liver biopsy in April, 2011 because of abnormal liver tests and positive ANA of 1:640.  In October, 2010 her AST was 635 ALT 276 phosphatase 157.  Repeat  liver tests in February, 2011 demonstrated an  AST of 79 and normal ALT and alkaline phosphatase .  Her serum IgG level was normal.  Biopsy demonstrated a minimally active chronic hepatitis  (grade 1);,no steatosis was seen and trichrome stain was negative.  She continues to feel well and has no GI complaints.  She has a history of a renal cell carcinoma and has been followed by serial CT scans because of a stable lung nodule.  Incidentally noted on at least 2 scans was a lobular contour of the liver suggesting cirrhosis,  and hepatic steatosis.   GI Review of Systems      Denies abdominal pain, acid reflux, belching, bloating, chest pain, dysphagia with liquids, dysphagia with solids, heartburn, loss of appetite, nausea, vomiting, vomiting blood, weight loss, and  weight gain.        Denies anal fissure, black tarry stools, change in bowel habit, constipation, diarrhea, diverticulosis, fecal incontinence, heme positive stool, hemorrhoids, irritable bowel syndrome, jaundice, light color stool, liver problems, rectal bleeding, and  rectal pain.    Current Medications (verified): 1)  Vivelle-Dot 0.075 Mg/24hr Pttw (Estradiol) .... As Directed 2)  Vitamin D (Ergocalciferol) 50000 Unit Caps (Ergocalciferol) .Marland Kitchen.. 1 Per Month 3)  Aspirin Adult Low Strength 81 Mg Tbec (Aspirin) .... Once Daily 4)  Gnp One Daily Womens 50+   Tabs (Multiple Vitamins-Minerals) .... Once Daily 5)  Oscal 500/200 D-3 500-200 Mg-Unit Tabs (Calcium-Vitamin D) .... Once Daily 6)  Vagifem 10 Mcg Tabs (Estradiol) .... As Directed 7)  Nexium 40 Mg Cpdr (Esomeprazole Magnesium) .Marland Kitchen.. 1 By Mouth Once Daily  Allergies (verified): 1)  ! Macrobid 2)  ! Codeine  Past History:  Past Medical History: arthritis Bursitis Hyperlipidemia Hiatal Hernia GERD  Past Surgical History: Reviewed history from 06/21/2009 and no changes required. Hysterectomy Oophorectomy Tonsillectomy Nephrectomy -1/3 removed  Family History: Reviewed history from 07/05/2009 and no changes required. mother--90 living (hx of thyroid ca) father---deceased 74 yo "blood cancer" brother deceased MVA No FH of Colon Cancer:  Social History: Reviewed history from 06/21/2009 and no changes required. Former Smoker-quit 1986 Alcohol Use - yes rare Illicit Drug Use - no Patient gets regular exercise. Occupation: Retired  Review of Systems  The patient denies allergy/sinus, anemia, anxiety-new, arthritis/joint pain, back pain, blood in urine, breast changes/lumps, confusion, cough, coughing up blood, depression-new, fainting, fatigue, fever, headaches-new, hearing problems, heart murmur, heart rhythm changes, itching, menstrual pain, muscle pains/cramps, night sweats, nosebleeds, pregnancy symptoms, shortness of breath, skin rash, sleeping problems, sore throat, swelling of feet/legs, swollen lymph glands, thirst - excessive, urination - excessive, urination changes/pain, urine leakage, vision changes, and voice change.    Vital Signs:  Patient profile:   65 year old female Height:      63 inches Weight:      163 pounds BMI:     28.98 Pulse rate:   82 /  minute Pulse rhythm:   regular BP sitting:   140 / 82  (left arm) Cuff size:   regular  Vitals Entered By: Francee Piccolo CMA Duncan Dull) (June 13, 2010 10:26 AM)  Physical Exam  Additional Exam:  On  physical exam she is well-developed well-nourished female  skin: anicteric HEENT: normocephalic; PEERLA; no nasal or pharyngeal abnormalities neck: supple nodes: no cervical lymphadenopathy chest: clear to ausculatation and percussion heart: no murmurs, gallops, or rubs abd: soft, nontender; BS normoactive; no abdominal masses, tenderness, organomegaly rectal: deferred ext: no cynanosis, clubbing, edema skeletal: no deformities neuro: oriented x 3; no focal abnormalities    Impression & Recommendations:  Problem # 1:  UNSPECIFIED CHRONIC HEPATITIS (ICD-571.40) 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  While pacified by her benign-appearing liver biopsy I am concerned that the persistent lobular appearance of the  liver reflects early cirrhosis.  Her liver biopsy may have been a sampling error. Although repeat liver tests have been requested they have not yet been obtained.  She could have active hepatitis from autoimmune hepatitis or, perhaps, [place].  Recommendations #1 repeat LFTs, serum protein electrophoresis #2 I will refer her to Dr. Iva Boop for a second opinion regarding her liver disease.  Repeat liver biopsy may be an option. Orders: TLB-CBC Platelet - w/Differential (85025-CBCD) TLB-CMP (Comprehensive Metabolic Pnl) (80053-COMP) TLB-PT (Protime) (85610-PTP) TLB-PTT (85730-PTTL) T-Serum Protein Electrophoresis ([address])  Problem # 2:  CARCINOMA, RENAL CELL (ICD-189.0) status post partial nephrectomy.  She has an ill-defined lung lesion that could represent a regressing inflammatory or metastatic nodule.  Patient Instructions: 1)  Copy sent to : Birdie Sons MD, Garnetta Buddy MD, Iva Boop MD 2)  Go to the basement for labs 3)  You want to discuss with PCP about you visit with Dr Arlyce Dice 4)  Call us when you are ready for the referral with Dr Corey Skains at [facility] 5)  The medication list was reviewed and  reconciled.  All changed / newly prescribed medications were explained.  A complete medication list was provided to the patient / caregiver.

## 2010-09-26 NOTE — Progress Notes (Signed)
Summary: Fax CT report   ---- Converted from flag ---- ---- 06/06/2010 11:54 AM, Louis Meckel MD wrote: SEnd CT report to Dr. Cato Mulligan ------------------------------  Done    sent report down to be scanned

## 2010-09-26 NOTE — Consult Note (Addendum)
Summary: [facility]  [facility]   Imported By: Maryln Gottron 08/30/2010 15:52:33  _____________________________________________________________________  External Attachment:    Type:   Image     Comment:   External Document

## 2010-09-26 NOTE — Letter (Signed)
Summary: [facility]  [facility]   Imported By: Maryln Gottron 09/09/2010 15:07:26  _____________________________________________________________________  External Attachment:    Type:   Image     Comment:   External Document

## 2010-09-26 NOTE — Progress Notes (Signed)
Summary: Lab reminder   Phone Note Outgoing Call Call back at [facility] Phone [phone]   Call placed by: Merri Ray CMA Duncan Dull),  May 27, 2010 8:24 AM Summary of Call: Called pt to inform that labs are due. L/M for pt and to call if she has any questions, Initial call taken by: Merri Ray CMA (AAMA),  May 27, 2010 8:25 AM

## 2010-09-26 NOTE — Procedures (Signed)
Summary: Liver Biopsy  Patient: Brenda [name] Note: All result statuses are Final unless otherwise noted.  Tests: (1) Liver Biopsy (LIV)   LIV Liver Biopsy          DONE     [facility]     [address] [place], [place]  [address]           OPERATIVE PROCEDURE REPORT           PATIENT:  [name], [name]  MR#:  [id number]     BIRTHDATE:  Apr 03, 1946  GENDER:  female           PHYSICIAN:  Barbette Hair. Arlyce Dice, MD     ASSISTANT:           PROCEDURE DATE:  11/23/2009     PROCEDURE:  Percutaneous Liver Biopsy with ultrasound           INDICATIONS:  suspicion of cirrhosis           MEDICATIONS:   Fentanyl 25 mcg IV, Versed 2 mg IV           DESCRIPTION OF PROCEDURE:  After the risks benefits and     alternatives of the procedure were thoroughly explained, informed     consent was obtained.  The patient was placed in the supine     position with the right side of the abdomen exposed.  The span of     the liver was verified under ultrasound.  The area was sterilely     prepped.  Under Betadine prep and local Xylocaine 1% asesthesia, a     percutaneous liver biopsy was performed using the left lateral     intercostal approach.  The Microvasive ASAP 15 gauge biopsy needle     was introduced and a a single-passage liver biopsy specimen was     obtained in the usual fashion.    The specimen was submitted for     histopathology.           COMPLICATIONS:  None           IMPRESSION:     1) S/p percutaneous liver biopsy     RECOMMENDATIONS:     1) await biopsy results     2) Call office to schedule visit in 1-2 weeks           ______________________________     Barbette Hair. Arlyce Dice, MD           CC:  Birdie Sons, M.D.           n.     eSIGNED:   Barbette Hair. Dougles Kimmey at 11/23/2009 09:28 AM           [place]ham Jordan, [id number]  Note: An exclamation mark (!) indicates a result that was not dispersed into the flowsheet. Document Creation Date: 11/23/2009 9:29  AM _______________________________________________________________________  (1) Order result status: Final Collection or observation date-time: 11/23/2009 09:25 Requested date-time:  Receipt date-time:  Reported date-time:  Referring Physician:   Ordering Physician: Melvia Heaps [phone]) Specimen Source:  Source: Launa Grill Order Number: [id number] Lab site:

## 2010-09-26 NOTE — Assessment & Plan Note (Signed)
Summary: discuss ct scan and lab results re: liver/cjr   Vital Signs:  Patient profile:   65 year old female Weight:      163 pounds Temp:     98.0 degrees F oral Pulse rate:   84 / minute BP sitting:   112 / 72  (left arm) Cuff size:   regular  Vitals Entered By: Alfred Levins, CMA (June 26, 2010 11:40 AM) CC: discuss CT and lab results   CC:  discuss CT and lab results.  Current Medications (verified): 1)  Vivelle-Dot 0.075 Mg/24hr Pttw (Estradiol) .... As Directed 2)  Vitamin D (Ergocalciferol) 50000 Unit Caps (Ergocalciferol) .Marland Kitchen.. 1 Per Month 3)  Aspirin Adult Low Strength 81 Mg Tbec (Aspirin) .... Once Daily 4)  Gnp One Daily Womens 50+  Tabs (Multiple Vitamins-Minerals) .... Once Daily 5)  Oscal 500/200 D-3 500-200 Mg-Unit Tabs (Calcium-Vitamin D) .... Once Daily 6)  Vagifem 10 Mcg Tabs (Estradiol) .... As Directed 7)  Nexium 40 Mg Cpdr (Esomeprazole Magnesium) .Marland Kitchen.. 1 By Mouth Once Daily  Allergies (verified): 1)  ! Macrobid 2)  ! Codeine  Past History:  Past Medical History: Last updated: 06/13/2010 arthritis Bursitis Hyperlipidemia Hiatal Hernia GERD  Past Surgical History: Last updated: 06/21/2009 Hysterectomy Oophorectomy Tonsillectomy Nephrectomy -1/3 removed  Family History: Last updated: 07/05/2009 mother--90 living (hx of thyroid ca) father---deceased 10 yo "blood cancer" brother deceased MVA No FH of Colon Cancer:  Social History: Last updated: 06/21/2009 Former Smoker-quit 1986 Alcohol Use - yes rare Illicit Drug Use - no Patient gets regular exercise. Occupation: Retired  Risk Factors: Exercise: yes (01/17/2009)  Risk Factors: Smoking Status: quit > 6 months (03/15/2010) Packs/Day: 1.0 (03/15/2010)  Physical Exam  General:  well-developed well-nourished female in no acute distress. HEENT exam atraumatic, normocephalic symmetric her muscles are intact. Neck is supple. Chest clear to auscultation cardiac exam S1-S2 are regular.  Abdominal exam active bowel sounds, soft, nontender. I'm unable to palpate the liver or spleen. Extremities there is no clubbing cyanosis or edema. I exam there's no icterus skin exam there is no jaundice   Impression & Recommendations:  Problem # 1:  UNSPECIFIED CHRONIC HEPATITIS (ICD-571.40) I have reviewed Dr. Marzetta Board note. I agree with referral. I've encouraged the patient to followup with referral. Long-term untreated hepatitis can result in severe liver disease and even death. Patient understands that.  Problem # 2:  CARCINOMA, RENAL CELL (ICD-189.0) no known recurrence.  Problem # 3:  MYALGIA (ICD-729.1) chronic problem. Could be fibromyalgia. Could be related to her autoimmune disease if that is indeed what she has. No specific treatment at this time. Her updated medication list for this problem includes:    Aspirin Adult Low Strength 81 Mg Tbec (Aspirin) ..... Once daily  Complete Medication List: 1)  Vivelle-dot 0.075 Mg/24hr Pttw (Estradiol) .... As directed 2)  Vitamin D (ergocalciferol) 50000 Unit Caps (Ergocalciferol) .Marland Kitchen.. 1 per month 3)  Aspirin Adult Low Strength 81 Mg Tbec (Aspirin) .... Once daily 4)  Gnp One Daily Womens 50+ Tabs (Multiple vitamins-minerals) .... Once daily 5)  Oscal 500/200 D-3 500-200 Mg-unit Tabs (Calcium-vitamin d) .... Once daily 6)  Vagifem 10 Mcg Tabs (Estradiol) .... As directed 7)  Nexium 40 Mg Cpdr (Esomeprazole magnesium) .Marland Kitchen.. 1 by mouth once daily   Orders Added: 1)  Est. Patient Level IV [[address]]

## 2010-10-02 NOTE — Letter (Signed)
Summary: Appt Reminder 2  [facility]  [address] [place], [place] [address]   Phone: [phone]  Fax: [phone]        September 24, 2010 MRN: [id number]    [facility] [address] [place], [place]  [address]    Dear Ms. Alvan Dame,   You have a return appointment with Dr. Arlyce Dice on 10/22/10 at 2:30pm.  Please remember to bring a complete list of the medicines you are taking, your insurance card and your co-pay.  If you have to cancel or reschedule this appointment, please call before 5:00 pm the evening before to avoid a cancellation fee.  If you have any questions or concerns, please call [phone].    Sincerely,    Selinda Michaels RN  Appended Document: Appt Reminder 2 Letter is mailed to the patient's home address

## 2010-10-02 NOTE — Progress Notes (Signed)
Summary: Results from Consult at [facility]   Phone Note Call from Patient Call back at Work Phone [phone]   Call For: Dr Arlyce Dice Reason for Call: Talk to Nurse Summary of Call: Upset she has not heard back from her [facility] test she had done. Initial call taken by: Leanor Kail [facility],  September 19, 2010 11:44 AM  Follow-up for Phone Call        Left message to call back Follow-up by: Selinda Michaels RN,  September 19, 2010 2:41 PM  Additional Follow-up for Phone Call Additional follow up Details #1::        Spoke with patient and she states that the physician from [facility] called her Saturday evening. She states that she is just supposed to have lab work done with Dr. Arlyce Dice in May and periodically. Offered to make a return OV with Dr. Arlyce Dice and she states that she only needs labs in May. Dr. Arlyce Dice is this correct. Additional Follow-up by: Selinda Michaels RN,  September 23, 2010 11:27 AM    Additional Follow-up for Phone Call Additional follow up Details #2::    I think she needs an OV  to discuss findings and for plans for Rx Follow-up by: Louis Meckel MD,  September 24, 2010 8:44 AM  Additional Follow-up for Phone Call Additional follow up Details #3:: Details for Additional Follow-up Action Taken: Patient given appointment for 10/22/10 @ 2:30pm. Patient verbalized understanding appointment date and time. Additional Follow-up by: Selinda Michaels RN,  September 24, 2010 4:27 PM

## 2010-10-04 ENCOUNTER — Encounter: Payer: Self-pay | Admitting: Internal Medicine

## 2010-10-04 ENCOUNTER — Ambulatory Visit (INDEPENDENT_AMBULATORY_CARE_PROVIDER_SITE_OTHER): Payer: BC Managed Care – PPO | Admitting: Internal Medicine

## 2010-10-04 VITALS — BP 130/80 | HR 89 | Temp 97.9°F | Ht 63.25 in | Wt 164.0 lb

## 2010-10-04 DIAGNOSIS — B029 Zoster without complications: Secondary | ICD-10-CM

## 2010-10-04 MED ORDER — TRIAMCINOLONE ACETONIDE 0.025 % EX OINT: TOPICAL_OINTMENT | Freq: Two times a day (BID) | CUTANEOUS | Status: DC

## 2010-10-04 NOTE — Assessment & Plan Note (Signed)
Rash distribution and neuropathic features c/w shingles. No secondary infxn evident. Outside recommended window for tx with prednisone/anti-viral. Begin topical steroid. Followup if no improvement or worsening.

## 2010-10-04 NOTE — Progress Notes (Signed)
  Subjective:    Patient ID: Brenda [name], female    DOB: Oct 23, 1945, 65 y.o.   MRN: [id number]  HPI Pt presents to clinic for evaluation of rash. Notes 3 wks ago developed erythematous rash along left ant abdomen now resolved. Now with one week h/o rash (erythematous papules) located anterior abdomen just left of midline. Does not cross the midline. Feels "nerve" discomfort along the rash and laterally. Discomfort worse with touch. No spread of rash and no other areas of body involved. Attempted no medications for this problem. No other exacerbating or alleviating factors. No known immunosuppression.  Reviewed PMH, medications, and allergies.    Review of Systems  Constitutional: Negative for fever, chills and fatigue.  Skin: Positive for color change and rash. Negative for pallor and wound.       Objective:   Physical Exam  Constitutional: She appears well-developed and well-nourished. No distress.  HENT:  Head: Normocephalic and atraumatic.  Skin: Skin is warm and dry. Rash noted. She is not diaphoretic. There is erythema.          Erythematous papules ?vesicles. Non draining. Tender to touch.          Assessment & Plan:

## 2010-10-22 ENCOUNTER — Ambulatory Visit (INDEPENDENT_AMBULATORY_CARE_PROVIDER_SITE_OTHER): Payer: BC Managed Care – PPO | Admitting: Gastroenterology

## 2010-10-22 ENCOUNTER — Encounter: Payer: Self-pay | Admitting: Gastroenterology

## 2010-10-22 DIAGNOSIS — K7460 Unspecified cirrhosis of liver: Secondary | ICD-10-CM

## 2010-10-31 NOTE — Assessment & Plan Note (Signed)
Summary: Follow up [facility] appt/sheri    History of Present Illness Visit Type: Follow-up Visit Primary GI MD: Melvia Heaps MD [facility] Primary Provider: Birdie Sons MD Requesting Provider: n/a Chief Complaint: F/u from visit with [facility].  Pt states that she is feeling good and denies any GI complaints  History of Present Illness:   Brenda [name]  returns for followup of her liver abnormality. She was seen at [facility] by Dr. 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  In the past she apparently was jaundiced after taking macrodantin, which raises a question of a drug-induced hepatitis. At any rate, she is feeling well and has no GI complaints. Last endoscopy in November, 2010  did not demonstrate varices. A distal esophageal stricture was dilated.   GI Review of Systems      Denies abdominal pain, acid reflux, belching, bloating, chest pain, dysphagia with liquids, dysphagia with solids, heartburn, loss of appetite, nausea, vomiting, vomiting blood, weight loss, and  weight gain.      Reports liver problems.     Denies anal fissure, black tarry stools, change in bowel habit, constipation, diarrhea, diverticulosis, fecal incontinence, heme positive stool, hemorrhoids, irritable bowel syndrome, jaundice, light color stool, rectal bleeding, and  rectal pain.    Current Medications (verified): 1)  Vivelle-Dot 0.075 Mg/24hr Pttw (Estradiol) .... As Directed 2)  Vitamin D (Ergocalciferol) 50000 Unit Caps (Ergocalciferol) .Marland Kitchen.. 1 Per Month 3)  Aspirin Adult Low Strength 81 Mg Tbec (Aspirin) .... Once Daily 4)  Gnp One Daily Womens 50+  Tabs (Multiple Vitamins-Minerals) .... Once Daily 5)  Oscal 500/200 D-3 500-200 Mg-Unit Tabs (Calcium-Vitamin D) .... Once Daily 6)  Vagifem 10 Mcg Tabs (Estradiol) .... As Directed 7)  Nexium 40 Mg Cpdr (Esomeprazole Magnesium) .Marland Kitchen.. 1 By Mouth Once Daily 8)  Milk Thistle 500 Mg Caps (Milk Thistle) .... Two-Three Capsule By  Mouth Once Daily  Allergies (verified): 1)  ! Macrobid 2)  ! Codeine  Past History:  Past Medical History: Arthritis Bursitis Hyperlipidemia Hiatal Hernia GERD Unspecified Chronic Hepatitis  Past Surgical History: Reviewed history from 06/21/2009 and no changes required. Hysterectomy Oophorectomy Tonsillectomy Nephrectomy -1/3 removed  Family History: Reviewed history from 07/05/2009 and no changes required. mother--90 living (hx of thyroid ca) father---deceased 10 yo "blood cancer" brother deceased MVA No FH of Colon Cancer:  Social History: Reviewed history from 06/21/2009 and no changes required. Former Smoker-quit 1986 Alcohol Use - yes rare Illicit Drug Use - no Patient gets regular exercise. Occupation: Retired  Review of Systems  The patient denies allergy/sinus, anemia, anxiety-new, arthritis/joint pain, back pain, blood in urine, breast changes/lumps, change in vision, confusion, cough, coughing up blood, depression-new, fainting, fatigue, fever, headaches-new, hearing problems, heart murmur, heart rhythm changes, itching, menstrual pain, muscle pains/cramps, night sweats, nosebleeds, pregnancy symptoms, shortness of breath, skin rash, sleeping problems, sore throat, swelling of feet/legs, swollen lymph glands, thirst - excessive , urination - excessive , urination changes/pain, urine leakage, vision changes, and voice change.    Vital Signs:  Patient profile:   65 year old female Height:      63 inches Weight:      164 pounds BMI:     29.16 BSA:     1.78 Pulse rate:   88 / minute Pulse rhythm:   regular BP sitting:   120 / 76  (left arm) Cuff size:   regular  Vitals Entered By: Ok Anis CMA (October 22, 2010 2:45 PM)  Impression & Recommendations:  Problem # 1:  UNSPECIFIED CHRONIC HEPATITIS (ICD-571.40)  She has early cirrhosis but functionally is  child class  A.   No specific therapy will be undertaken per hepatology note from [facility].    Recommendations #1 surveillance endoscopy yearly for varices  Patient Instructions: 1)  Copy sent to : Birdie Sons MD 2)  Please contact the office when you are ready to schedule your Upper Endoscopy 3)  The medication list was reviewed and reconciled.  All changed / newly prescribed medications were explained.  A complete medication list was provided to the patient / caregiver.

## 2010-11-30 LAB — BASIC METABOLIC PANEL
CO2: 28 mEq/L (ref 19–32)
Calcium: 8.1 mg/dL — ABNORMAL LOW (ref 8.4–10.5)
Calcium: 8.4 mg/dL (ref 8.4–10.5)
Chloride: 103 mEq/L (ref 96–112)
Creatinine, Ser: 0.85 mg/dL (ref 0.4–1.2)
GFR calc Af Amer: >60 mL/min (ref >60)
GFR calc Af Amer: >60 mL/min (ref >60)
GFR calc Af Amer: >60 mL/min (ref >60)
GFR calc non Af Amer: >60 mL/min (ref >60)
GFR calc non Af Amer: >60 mL/min (ref >60)
GFR calc non Af Amer: >60 mL/min (ref >60)
Glucose, Bld: 137 mg/dL — ABNORMAL HIGH (ref 70–99)
Glucose, Bld: 163 mg/dL — ABNORMAL HIGH (ref 70–99)
Potassium: 4 mEq/L (ref 3.5–5.1)
Sodium: 133 mEq/L — ABNORMAL LOW (ref 135–145)
Sodium: 136 mEq/L (ref 135–145)
Sodium: 136 mEq/L (ref 135–145)

## 2010-11-30 LAB — HEMOGLOBIN AND HEMATOCRIT, BLOOD
HCT: 38.5 % (ref 36.0–46.0)
Hemoglobin: 13 g/dL (ref 12.0–15.0)

## 2010-11-30 LAB — CBC
HCT: 30.5 % — ABNORMAL LOW (ref 36.0–46.0)
HCT: 31 % — ABNORMAL LOW (ref 36.0–46.0)
Hemoglobin: 10.2 g/dL — ABNORMAL LOW (ref 12.0–15.0)
Hemoglobin: 10.3 g/dL — ABNORMAL LOW (ref 12.0–15.0)
Hemoglobin: 11.4 g/dL — ABNORMAL LOW (ref 12.0–15.0)
MCHC: 33.4 g/dL (ref 30.0–36.0)
MCHC: 33.5 g/dL (ref 30.0–36.0)
MCHC: 33.5 g/dL (ref 30.0–36.0)
MCV: 93 fL (ref 78.0–100.0)
MCV: 93.5 fL (ref 78.0–100.0)
MCV: 93.9 fL (ref 78.0–100.0)
Platelets: 153 10*3/uL (ref 150–400)
RBC: 3.24 MIL/uL — ABNORMAL LOW (ref 3.87–5.11)
RBC: 3.31 MIL/uL — ABNORMAL LOW (ref 3.87–5.11)
RDW: 15.5 % (ref 11.5–15.5)
RDW: 15.7 % — ABNORMAL HIGH (ref 11.5–15.5)
WBC: 11.3 10*3/uL — ABNORMAL HIGH (ref 4.0–10.5)
WBC: 7.3 10*3/uL (ref 4.0–10.5)

## 2010-11-30 LAB — DIFFERENTIAL
Basophils Absolute: 0 10*3/uL (ref 0.0–0.1)
Basophils Relative: 0 % (ref 0–1)
Lymphocytes Relative: 9 % — ABNORMAL LOW (ref 12–46)
Monocytes Absolute: 1.7 10*3/uL — ABNORMAL HIGH (ref 0.1–1.0)
Neutro Abs: 15.7 10*3/uL — ABNORMAL HIGH (ref 1.7–7.7)
Neutrophils Relative %: 82 % — ABNORMAL HIGH (ref 43–77)

## 2010-11-30 LAB — TYPE AND SCREEN: Antibody Screen: NEGATIVE

## 2010-11-30 LAB — COMPREHENSIVE METABOLIC PANEL
Albumin: 3.5 g/dL (ref 3.5–5.2)
BUN: 9 mg/dL (ref 6–23)
Creatinine, Ser: 0.68 mg/dL (ref 0.4–1.2)
Total Bilirubin: 1.2 mg/dL (ref 0.3–1.2)
Total Protein: 8.6 g/dL — ABNORMAL HIGH (ref 6.0–8.3)

## 2010-12-12 ENCOUNTER — Telehealth: Payer: Self-pay | Admitting: *Deleted

## 2010-12-12 NOTE — Telephone Encounter (Signed)
CALLED PT TO INFORM THAT REPEAT LFT'S ARE DUE AT THIS TIME, L/M FOR PT TO R/C

## 2010-12-18 ENCOUNTER — Encounter: Payer: Self-pay | Admitting: *Deleted

## 2010-12-23 ENCOUNTER — Telehealth: Payer: Self-pay | Admitting: Gastroenterology

## 2010-12-23 DIAGNOSIS — K759 Inflammatory liver disease, unspecified: Secondary | ICD-10-CM

## 2010-12-23 NOTE — Telephone Encounter (Signed)
Pt instructed that she was supposed to have LFT's drawn. Order placed in Epic, pt states she will have labs drawn tomorrow.

## 2010-12-24 ENCOUNTER — Other Ambulatory Visit (INDEPENDENT_AMBULATORY_CARE_PROVIDER_SITE_OTHER): Payer: BC Managed Care – PPO

## 2010-12-24 DIAGNOSIS — B029 Zoster without complications: Secondary | ICD-10-CM

## 2010-12-24 LAB — HEPATIC FUNCTION PANEL
ALT: 15 U/L (ref 0–35)
AST: 32 U/L (ref 0–37)
Total Bilirubin: 0.3 mg/dL (ref 0.3–1.2)
Total Protein: 6.9 g/dL (ref 6.0–8.3)

## 2011-01-07 NOTE — Discharge Summary (Signed)
[name], [name]               ACCOUNT NO.:  [id number]   MEDICAL RECORD NO.:  [id number]          PATIENT TYPE:  INP   LOCATION:  1419                         FACILITY:  [facility]   PHYSICIAN:  Bertram Millard. Dahlstedt, M.D.DATE OF BIRTH:  [birth year]   DATE OF ADMISSION:  04/16/2009  DATE OF DISCHARGE:  04/19/2009                               DISCHARGE SUMMARY   ADMISSION DIAGNOSIS:  Right renal mass.   DISCHARGE DIAGNOSIS:  Renal cell carcinoma, clear cell type, nuclear  grade 3, PT1A, PNX, PMX.   PROCEDURES:  Laparoscopic right partial nephrectomy.   HISTORY AND PHYSICAL:  For full details, please see admission history  and physical.  Briefly, Brenda [name] is 65 year old female who was  incidentally found to have a right renal mass recently after having MRI  for elevated liver function tests.  There is a history of hepatitis.  MRI findings were normal except for a 5 cm right lower pole renal mass.  No other abnormalities intra-abdominally were noted.  The patient has  undergone metastatic evaluation, which proved negative.  She does  continue to have persistent elevation in her LFTs, however.  After  discussion regarding mane, treatment, she elected to proceed with  surgical therapy and a right laparoscopic assisted partial nephrectomy.   HOSPITAL COURSE:  On April 16, 2009, she was taken to the operating  room where she underwent the above named procedure, which she tolerated  well, without complications.  Postoperatively, she was monitored  overnight and remained hemodynamically stable.  Her postoperative  hematocrit was 36.5 and again, recheck on postoperative day #1, was  found to be stable at 33.9.  Her hematocrit did somewhat decrease on  postoperative day #2 to 30.5, although by postoperative day #3, upon  discharge, had stabilized at 31.2.  Her serum creatinine was found to be  stable throughout the complete hospitalization as noted of 0.83 and  found on discharge to be  0.72.  She was able to begin a clear liquid  diet on postoperative day #1, which she tolerated without difficulty.  She was also able to be transitioned to a regular diet over the next 24-  48 hours.  She began ambulating after bed-rest for 24 hours, on  postoperative day #1, with no difficulty.  She was also transitioned to  oral pain medications at that time.  On postoperative day #1, her blood  pressures were found to be slightly lower than usual, therefore,  bolus  of fluid were given and her fluids were increased overnight.  She did  maintain excellent urine output throughout that time.  On postoperative  day #2, her blood pressure had normalized and was maintained throughout  the rest of hospitalization.  On postoperative day #2, she maintained  excellent urine output and had minimal output from her perinephric  drain, therefore, it was sent for creatinine level and found to be  consistent with serum at 0.8.  On postoperative day #3, she was  tolerating her regular diet, ambulating without difficulty and her pain  was manageable.  She was, therefore, able to be discharged home in  excellent condition.   DISPOSITION:  Home.   DISCHARGE MEDICATIONS:  She was instructed to resume her regular home  medications consisting of:  1. Omeprazole.  2. Meloxicam.  3. Vivelle.  4. Diazepam.  5. Trimethoprim.  6. Vitamin D3.  7. Multivitamin.  8. Calcium plus vitamin D.  9. Aspirin.  In addition, she was provided a prescription for Vicodin to use as  needed for pain and told to use Colace as a stool softener.  She was  also provided a prescription for Ambien to use as needed at bedtime for  rest.   DISCHARGE INSTRUCTIONS:  She was instructed to be ambulatory but  specifically told to refrain from any heavy lifting, strenuous activity  or driving.  She was instructed to resume her regular diet.   DISCHARGE FOLLOWUP:  She will return in 10-14 days for further  postoperative  evaluation.   PATHOLOGY:  Her pathology did return as a renal cell carcinoma, clear  cell type, nuclear grade 3, PT1A, PNX, PMX      Brenda Chimes, NP      Bertram Millard. Dahlstedt, M.D.  Electronically Signed    MA/MEDQ  D:  04/24/2009  T:  04/24/2009  Job:  [id number]

## 2011-01-07 NOTE — Op Note (Signed)
Brenda [name], Brenda [name]               ACCOUNT NO.:  [id number]   MEDICAL RECORD NO.:  [id number]          PATIENT TYPE:  INP   LOCATION:  0003                         FACILITY:  [facility]   PHYSICIAN:  Bertram Millard. Dahlstedt, M.D.DATE OF BIRTH:  17-May-1946   DATE OF PROCEDURE:  04/16/2009  DATE OF DISCHARGE:                               OPERATIVE REPORT   PREOPERATIVE DIAGNOSIS:  Right renal mass.   POSTOPERATIVE DIAGNOSIS:  Right renal mass.   PRINCIPAL PROCEDURE:  Laparoscopic right partial nephrectomy.   SURGEON:  Bertram Millard. Dahlstedt, M.D.   FIRST ASSISTANT:  Heloise Purpura, MD.   SECOND ASSISTANT:  Delia Chimes, NP.   ANESTHESIA:  General endotracheal.   SPECIMEN:  Right renal mass, frozen section x3.   DRAINS:  Right upper quadrant 10 mm flat Blake drain.   ESTIMATED BLOOD LOSS:  250 mL.   BLOOD REPLACEMENT:  None.   BRIEF HISTORY:  This 65 year old female presents at this time for  attempted right laparoscopic nephrectomy.  She was originally diagnosed  with a right renal mass recently, having had a MRI for elevating LFTs.  There is a history of hepatitis.  MRI findings were normal, except for a  5 cm right lower pole renal mass.  No other abnormalities intra-  abdominally were noted.  The patient has undergone metastatic survey  which was negative.  She has persistent elevation in her LFTs, however.  She was referred to me within the office by Courtney Paris, M.D.  I have talked with the patient twice regarding her right renal mass.  It  is quite exophytic.  Seeing that there is no evidence of metastatic  disease, right partial nephrectomy is the procedure of choice.  She  would like to perform this laparoscopically.  The risks and  complications of the procedure have been discussed with the patient at  length.  She understands these and desires to proceed.   DESCRIPTION OF PROCEDURE:  The patient was identified in the holding  area and the surgical side marked.   She received preoperative IV  antibiotics.  She was taken to the operating room where general  endotracheal anesthetic was established.  She was placed in the right  flank up position, the table flexed, and a beanbag was used to secure in  place.  All extremities were padded appropriately, an axillary roll was  placed.  Her bladder was decompressed with Foley catheter.  Her entire  abdomen and right flank were prepped and draped.  A 15 mm incision was  made underneath her umbilicus and carried down to fascia with  electrocautery.  The fascia was divided in the midline.  I then gained  access to the  peritoneum.  A Hasson cannula was placed, and sutured in  place with 2-0 Vicryl sutures.  Pneumoperitoneum was established.  Three  other ports were placed - a 5 mm port in the upper midline just beneath  the xiphisternum, and a 5 mm port for liver retraction at the right  anterior axillary line underneath the costal margin.  A 12 mm port was  placed in  the right lower quadrant, approximately 6- 7 cm lateral to the  umbilicus.  It was evident that the liver was quite enlarged.  We then,  used the right upper quadrant port to retract the liver.  The colon was  mobilized using sharp dissection.  Additionally, the duodenum was  mobilized/reflected medially.  We then identified the vena cava.  Dissection was carried distally in the vena cava.  The gonadal vein and  ureter were identified, and dissection was carried out posterior to  these, we then easily gained access to the psoas muscle.  Dissection was  carried out posteriorly and superiorly along the psoas.  Dissection  superiorly along the vena cava was quite tedious, as there was  significant fibrous tissue, and the right renal vein was then identified  finally.  The single renal artery was lateral and superior to the renal  vein, which made dissection somewhat difficult.  Additionally, the liver  being enlarged made dissection somewhat  difficult through the upper  midline incision/trocar.  However, the renal artery was skeletonized.  At this point, we dissected down to the renal mass.  The perinephric fat  was quite thick and fibrous.  We finally dissected down to the capsule  circumferentially to the lower part of the kidney.  Care was taken to  make sure that the ureter and gonadal vein were well medial to this  dissection.  Once the lower margin of the normal kidney was identified  circumferentially, we then gave 12.5 mg of mannitol intravenously.  A  bulldog was then placed on the artery.  Scissors were then used to  separate the renal mass from the lower pole of the kidney.  There was  just a mild amount of bleeding during this dissection.  Once the mass  had been detached from the kidney, on the posterior margin, we sent a  biopsy for frozen.  This revealed that the margin was positive.  Two  more deeper margins, one the Lapra-Ty, and the deeper margin from that  were sent.  The deeper margin returned no evidence of cancer.  The renal  bed, where the mass had been resected, was then cauterized with the  argon beam coagulator.  We then placed a 2-0 Vicryl through the edges of  the kidney, incorporating a very large Surgicel pledget.  Before it was  tied into place and cinched down, FloSeal was placed underneath the  pledgets.  At this point, the suture was cinched down and Lapra-Ty was  used to secure this.  After the deep margin frozen section had been  established is negative, we then took the bulldog off of the renal  artery.  Total clamp time was just over an hour.  The 12.5 grams of  mannitol was given at this point again.  Inspection of the dissected  area revealed no bleeding.  A 10 mm flat Blake drain was placed into the  perinephric space and brought out through the right upper quadrant  trocar site.  A zero Vicryl suture was placed in the right lower  quadrant to close the port site using a suture passer.   This was then  tied.  The specimen was brought out through LapSac that  was placed  through the Hasson cannula.  The midline incision was opened up somewhat  to allow removal of this.  The midline incision was then closed with a  running zero Vicryl suture.  Prior to removal of all the trocars, the  trocar  sites were inspected.  There was no bleeding from these.  The  pneumoperitoneum was released.  Staples were used to reapproximate the  skin edges.  Dry sterile dressings were then placed.   The patient tolerated the procedure well.  She is awakened and taken to  PACU in stable condition.  Estimated blood loss 250 mL.      Bertram Millard. Dahlstedt, M.D.  Electronically Signed     SMD/MEDQ  D:  04/16/2009  T:  04/16/2009  Job:  [id number]   cc:   Valetta Mole. Swords, MD  [address] [place]  [place] [address]

## 2011-01-10 ENCOUNTER — Ambulatory Visit (HOSPITAL_COMMUNITY)
Admission: RE | Admit: 2011-01-10 | Discharge: 2011-01-10 | Disposition: A | Discharge status: Home / Self Care | Payer: BC Managed Care – PPO | Source: Ambulatory Visit | Attending: Urology | Admitting: Urology

## 2011-01-10 ENCOUNTER — Other Ambulatory Visit: Payer: Self-pay | Admitting: Urology

## 2011-01-10 DIAGNOSIS — C649 Malignant neoplasm of unspecified kidney, except renal pelvis: Secondary | ICD-10-CM

## 2011-01-14 ENCOUNTER — Other Ambulatory Visit: Payer: Self-pay | Admitting: Obstetrics and Gynecology

## 2011-01-14 DIAGNOSIS — Z1231 Encounter for screening mammogram for malignant neoplasm of breast: Secondary | ICD-10-CM

## 2011-01-17 ENCOUNTER — Ambulatory Visit
Admission: RE | Admit: 2011-01-17 | Discharge: 2011-01-17 | Disposition: A | Discharge status: Home / Self Care | Payer: BC Managed Care – PPO | Source: Ambulatory Visit | Attending: Obstetrics and Gynecology | Admitting: Obstetrics and Gynecology

## 2011-01-17 DIAGNOSIS — Z1231 Encounter for screening mammogram for malignant neoplasm of breast: Secondary | ICD-10-CM

## 2011-03-18 ENCOUNTER — Telehealth: Payer: Self-pay | Admitting: *Deleted

## 2011-03-24 ENCOUNTER — Telehealth: Payer: Self-pay | Admitting: *Deleted

## 2011-03-25 NOTE — Telephone Encounter (Signed)
Faxed paperwork  For authorization

## 2011-03-28 NOTE — Telephone Encounter (Signed)
Pt approved for her medication through [facility]

## 2011-03-28 NOTE — Telephone Encounter (Signed)
Pt approved for her medication

## 2011-05-19 ENCOUNTER — Telehealth: Payer: Self-pay | Admitting: *Deleted

## 2011-05-19 NOTE — Telephone Encounter (Signed)
Message copied by Marlowe Kays on Mon May 19, 2011  3:15 PM ------      Message from: Melvia Heaps MD D      Created: Wed May 14, 2011  3:59 PM       Please schedule patient for an office visit

## 2011-05-21 NOTE — Telephone Encounter (Signed)
Pt scheduled to see dr Arlyce Dice on 06/05/2011 at 9:45am.

## 2011-05-30 ENCOUNTER — Telehealth: Payer: Self-pay | Admitting: Internal Medicine

## 2011-05-30 ENCOUNTER — Ambulatory Visit: Payer: BC Managed Care – PPO | Admitting: *Deleted

## 2011-05-30 MED ORDER — ZOSTER VACCINE LIVE 19400 UNT/0.65ML ~~LOC~~ SOLR: 0.6500 mL | Freq: Once | SUBCUTANEOUS | Status: DC

## 2011-05-30 NOTE — Telephone Encounter (Signed)
rx sent in electronically 

## 2011-05-30 NOTE — Telephone Encounter (Signed)
Patient cancelled her Shingles vax appt with Arline Asp today. Her insurance company suggested going to [facility] at [address] to have it done. Patient would like for [facility] to sent a rx for the Shingles vax to her pharmacy today. Thanks.

## 2011-06-05 ENCOUNTER — Ambulatory Visit (INDEPENDENT_AMBULATORY_CARE_PROVIDER_SITE_OTHER): Payer: Medicare Other | Admitting: Gastroenterology

## 2011-06-05 ENCOUNTER — Other Ambulatory Visit (INDEPENDENT_AMBULATORY_CARE_PROVIDER_SITE_OTHER): Payer: Medicare Other

## 2011-06-05 ENCOUNTER — Encounter: Payer: Self-pay | Admitting: Gastroenterology

## 2011-06-05 VITALS — BP 120/74 | HR 72 | Ht 63.0 in | Wt 166.6 lb

## 2011-06-05 DIAGNOSIS — C649 Malignant neoplasm of unspecified kidney, except renal pelvis: Secondary | ICD-10-CM

## 2011-06-05 DIAGNOSIS — R9389 Abnormal findings on diagnostic imaging of other specified body structures: Secondary | ICD-10-CM

## 2011-06-05 DIAGNOSIS — K739 Chronic hepatitis, unspecified: Secondary | ICD-10-CM

## 2011-06-05 DIAGNOSIS — R933 Abnormal findings on diagnostic imaging of other parts of digestive tract: Secondary | ICD-10-CM

## 2011-06-05 LAB — PROTIME-INR
INR: 1 ratio (ref 0.8–1.0)
Prothrombin Time: 11.1 s (ref 10.2–12.4)

## 2011-06-05 LAB — HEPATIC FUNCTION PANEL
AST: 28 U/L (ref 0–37)
Albumin: 4.3 g/dL (ref 3.5–5.2)
Total Bilirubin: 0.6 mg/dL (ref 0.3–1.2)

## 2011-06-05 LAB — AFP TUMOR MARKER: AFP-Tumor Marker: 6.5 ng/mL (ref 0.0–8.0)

## 2011-06-05 NOTE — Progress Notes (Signed)
History of Present Illness:  Brenda [name] has returned for evaluation of an abnormal CT. She has a history of renal cell carcinoma and underwent a routine CT scan. This exam, which I reviewed, demonstrated a 14 mm nodule in the right lobe of the liver that is suspicious for a hepatoma. The patient feels well and has no GI complaints including pain, jaundice or pruritus.    Review of Systems: Pertinent positive and negative review of systems were noted in the above HPI section. All other review of systems were otherwise negative.    Current Medications, Allergies, Past Medical History, Past Surgical History, Family History and Social History were reviewed in [facility] electronic medical record  Vital signs were reviewed in today's medical record. Physical Exam: General: Well developed , well nourished, no acute distress Head: Normocephalic and atraumatic Eyes:  sclerae anicteric, EOMI Ears: Normal auditory acuity Mouth: No deformity or lesions Lungs: Clear throughout to auscultation Heart: Regular rate and rhythm; no murmurs, rubs or bruits Abdomen: Soft, non tender and non distended. No masses, hepatosplenomegaly or hernias noted. Normal Bowel sounds Rectal:deferred Musculoskeletal: Symmetrical with no gross deformities  Pulses:  Normal pulses noted Extremities: No clubbing, cyanosis, edema or deformities noted Neurological: Alert oriented x 4, grossly nonfocal Psychological:  Alert and cooperative. Normal mood and affect

## 2011-06-05 NOTE — Assessment & Plan Note (Addendum)
CT scan raises the question of a hepatoma in the right lobe of the liver.  Recommendations and #1 check LFTs and alpha-fetoprotein #2 to consider percutaneous biopsy. I will discuss this with radiology  CT scan was reviewed with Dr. Miles Costain who is not convinced that the lesion described is suspicious for a hepatoma. Alpha-fetoprotein is normal. His recommendation was to repeat the scan or get an MRI in 6 months. This will be scheduled.

## 2011-06-05 NOTE — Assessment & Plan Note (Signed)
She has stable hepatic function but early cirrhosis by CT criteria  Recommendations  #1 patient is due for surveillance endoscopy for varices; however this until workup of her liver nodule is completed.

## 2011-06-05 NOTE — Patient Instructions (Signed)
You will go to the basement for labs today  

## 2011-06-16 ENCOUNTER — Telehealth: Payer: Self-pay | Admitting: *Deleted

## 2011-06-16 DIAGNOSIS — R9389 Abnormal findings on diagnostic imaging of other specified body structures: Secondary | ICD-10-CM

## 2011-06-16 NOTE — Telephone Encounter (Signed)
Call pt to inform MRI is due in another 6 months

## 2011-06-16 NOTE — Telephone Encounter (Signed)
Message copied by Marlowe Kays on Mon Jun 16, 2011 10:51 AM ------      Message from: Melvia Heaps D      Created: Fri Jun 06, 2011  3:27 PM       Please schedule a MRI of the abdomen in 6 months. Tell the patient that the CT scan was reviewed by myself with the radiologist who felt that the area suspicion is actually very low and that the recommendation is to simply get a repeat scan in 6 months.

## 2011-06-20 NOTE — Telephone Encounter (Signed)
LM for pt to returncall

## 2011-06-23 ENCOUNTER — Telehealth: Payer: Self-pay | Admitting: Gastroenterology

## 2011-06-23 NOTE — Telephone Encounter (Signed)
Spoke with pt and she is aware that she needs an mri of abdomen in 6 months.

## 2011-07-04 NOTE — Telephone Encounter (Signed)
Orders for pt to have MRI are in the system  No answer

## 2011-07-16 ENCOUNTER — Telehealth: Payer: Self-pay | Admitting: Internal Medicine

## 2011-07-16 MED ORDER — LISINOPRIL 10 MG PO TABS: 10.0000 mg | ORAL_TABLET | Freq: Every day | ORAL | Status: DC

## 2011-07-16 NOTE — Telephone Encounter (Signed)
Pt has sch a mcr cpx for 09/09/11. Pt is having elevated bp readings, of 154/78, 161/82, 154/80. Pt doesn't know if she should be concerned or not. Pls call back asap.

## 2011-07-16 NOTE — Telephone Encounter (Signed)
BPs seem to be elevated Start lisinopril 10 mg po qd. See me as scheduled.

## 2011-07-23 ENCOUNTER — Telehealth: Payer: Self-pay | Admitting: Internal Medicine

## 2011-07-23 NOTE — Telephone Encounter (Signed)
Called pt and sch her for work in cpx for 08/20/11 at 9:45am as noted.

## 2011-07-23 NOTE — Telephone Encounter (Signed)
We will work in

## 2011-07-23 NOTE — Telephone Encounter (Signed)
Pt called and said that her insurance requires that she gets her mcr cpx before end of year, in order for insurance to cover. Pt had sch cpx for janaury, but has to be this year. Pls advise.

## 2011-08-20 ENCOUNTER — Ambulatory Visit (INDEPENDENT_AMBULATORY_CARE_PROVIDER_SITE_OTHER): Payer: Medicare Other | Admitting: Internal Medicine

## 2011-08-20 ENCOUNTER — Encounter: Payer: Self-pay | Admitting: Internal Medicine

## 2011-08-20 VITALS — BP 142/82 | HR 80 | Temp 98.4°F | Ht 63.5 in | Wt 167.0 lb

## 2011-08-20 DIAGNOSIS — Z23 Encounter for immunization: Secondary | ICD-10-CM

## 2011-08-20 DIAGNOSIS — I10 Essential (primary) hypertension: Secondary | ICD-10-CM

## 2011-08-20 DIAGNOSIS — C649 Malignant neoplasm of unspecified kidney, except renal pelvis: Secondary | ICD-10-CM

## 2011-08-20 DIAGNOSIS — Z0000 Encounter for general adult medical examination without abnormal findings: Secondary | ICD-10-CM

## 2011-08-20 DIAGNOSIS — K739 Chronic hepatitis, unspecified: Secondary | ICD-10-CM

## 2011-08-20 LAB — BASIC METABOLIC PANEL
BUN: 18 mg/dL (ref 6–23)
Creatinine, Ser: 0.9 mg/dL (ref 0.4–1.2)
GFR: 65.03 mL/min (ref >60.00)
Potassium: 3.8 mEq/L (ref 3.5–5.1)

## 2011-08-20 LAB — LIPID PANEL
Cholesterol: 248 mg/dL — ABNORMAL HIGH (ref 0–200)
Triglycerides: 180 mg/dL — ABNORMAL HIGH (ref 0.0–149.0)

## 2011-08-20 LAB — CBC WITH DIFFERENTIAL/PLATELET
Basophils Absolute: 0 10*3/uL (ref 0.0–0.1)
Eosinophils Absolute: 0.1 10*3/uL (ref 0.0–0.7)
Lymphocytes Relative: 27.3 % (ref 12.0–46.0)
MCHC: 33.8 g/dL (ref 30.0–36.0)
Neutrophils Relative %: 61.4 % (ref 43.0–77.0)
Platelets: 206 10*3/uL (ref 150.0–400.0)
RDW: 14.2 % (ref 11.5–14.6)

## 2011-08-20 LAB — HEPATIC FUNCTION PANEL
AST: 21 U/L (ref 0–37)
Albumin: 4 g/dL (ref 3.5–5.2)
Alkaline Phosphatase: 57 U/L (ref 39–117)
Total Protein: 7.7 g/dL (ref 6.0–8.3)

## 2011-08-20 LAB — TSH: TSH: 0.64 u[IU]/mL (ref 0.35–5.50)

## 2011-08-20 NOTE — Assessment & Plan Note (Signed)
No recurrence Sees urology every 6 months

## 2011-08-20 NOTE — Progress Notes (Signed)
  Subjective:    Patient ID: Brenda [name], female    DOB: April 21, 1946, 65 y.o.   MRN: [id number]  HPI Here for medicare exam   Review of Systems     Objective:   Physical Exam        Assessment & Plan:   Subjective:    Brenda [name] is a 65 y.o. female who presents for a welcome to Medicare exam.   Cardiac risk factors: advanced age (older than 49 for men, 33 for women) and hypertension.  Activities of Daily Living  In your present state of health, do you have any difficulty performing the following activities?:  Preparing food and eating?: No Bathing yourself: No  Current exercise habits: The patient does not participate in regular exercise at present.   Dietary issues discussed: no problems   Depression Screen (Note: if answer to either of the following is "Yes", then a more complete depression screening is indicated)  Q1: Over the past two weeks, have you felt down, depressed or hopeless?no  The following portions of the patient's history were reviewed and updated as appropriate: allergies, current medications, past family history, past medical history, past social history, past surgical history and problem list. Review of Systems   patient denies chest pain, shortness of breath, orthopnea. Denies lower extremity edema, abdominal pain, change in appetite, change in bowel movements. Patient denies rashes, musculoskeletal complaints. No other specific complaints in a complete review of systems.    Objective:       Blood pressure 142/82, pulse 80, temperature 98.4 F (36.9 C), temperature source Oral, height 5' 3.5" (1.613 m), weight 167 lb (75.751 kg). Body mass index is 29.12 kg/(m^2).  Well-developed well-nourished female in no acute distress. HEENT exam atraumatic, normocephalic, extraocular muscles are intact. Neck is supple. No jugular venous distention no thyromegaly. Chest clear to auscultation without increased work of breathing. Cardiac exam S1 and S2  are regular. Abdominal exam active bowel sounds, soft, nontender. Extremities no edema. Neurologic exam she is alert without any motor sensory deficits. Gait is normal.   Assessment:    Well visit- health maint UTD     Plan:     During the course of the visit the patient was educated and counseled about appropriate screening and preventive services including:   Pneumococcal vaccine   Influenza vaccine  Td vaccine  Diabetes screening  Patient Instructions (the written plan) was given to the patient.

## 2011-09-04 ENCOUNTER — Telehealth: Payer: Self-pay | Admitting: *Deleted

## 2011-09-04 NOTE — Telephone Encounter (Signed)
Pt is requesting lab results from 08/20/11.

## 2011-09-09 ENCOUNTER — Telehealth: Payer: Self-pay | Admitting: *Deleted

## 2011-09-09 ENCOUNTER — Encounter: Payer: Medicare Other | Admitting: Internal Medicine

## 2011-09-09 NOTE — Telephone Encounter (Signed)
Do you want to increase losartan from 10 to 20 mg?  Pt states this is what you told her but I don't see it on med list

## 2011-09-10 MED ORDER — LISINOPRIL 20 MG PO TABS: 20.0000 mg | ORAL_TABLET | Freq: Every day | ORAL | Status: DC

## 2011-09-10 NOTE — Telephone Encounter (Signed)
Increase lisinopril to 20 mg po qd

## 2011-09-10 NOTE — Telephone Encounter (Signed)
Notified pt. 

## 2011-11-18 ENCOUNTER — Other Ambulatory Visit: Payer: Self-pay | Admitting: Gastroenterology

## 2011-11-18 ENCOUNTER — Telehealth: Payer: Self-pay

## 2011-11-18 DIAGNOSIS — K769 Liver disease, unspecified: Secondary | ICD-10-CM

## 2011-11-18 NOTE — Telephone Encounter (Signed)
Pt scheduled for CT of abdomen and pelvis at [facility]  11/25/11 arrival time 2pm, scan at 3pm. Pt to have labs drawn prior to CT scan. Order in the computer for labs. Pt to pick up contrast and instructions from [facility]. Pt aware of appt date and time.

## 2011-11-18 NOTE — Telephone Encounter (Signed)
Message copied by Chrystie Nose on Tue Nov 18, 2011 10:13 AM ------      Message from: [place], [place] R      Created: Mon Jun 23, 2011  3:21 PM      Regarding: mri       Pt needs MRI of abdomen in ........around April 2013.

## 2011-11-18 NOTE — Telephone Encounter (Signed)
Pt scheduled for repeat MRI of abdomen. Pt states she has a hard time lying on her back due to such bad indigestion and she cannot stand being in that "tube." Pt wants to know if there is anything else she can have done instead of the MRI. Dr. Arlyce Dice please advise.

## 2011-11-18 NOTE — Telephone Encounter (Signed)
OK to repeat CT scan instead

## 2011-11-25 ENCOUNTER — Telehealth: Payer: Self-pay | Admitting: *Deleted

## 2011-11-25 ENCOUNTER — Ambulatory Visit (HOSPITAL_COMMUNITY)
Admission: RE | Admit: 2011-11-25 | Discharge: 2011-11-25 | Disposition: A | Discharge status: Home / Self Care | Payer: Medicare Other | Source: Ambulatory Visit | Attending: Gastroenterology | Admitting: Gastroenterology

## 2011-11-25 DIAGNOSIS — Z85528 Personal history of other malignant neoplasm of kidney: Secondary | ICD-10-CM | POA: Diagnosis not present

## 2011-11-25 DIAGNOSIS — K5730 Diverticulosis of large intestine without perforation or abscess without bleeding: Secondary | ICD-10-CM | POA: Insufficient documentation

## 2011-11-25 DIAGNOSIS — K449 Diaphragmatic hernia without obstruction or gangrene: Secondary | ICD-10-CM | POA: Insufficient documentation

## 2011-11-25 DIAGNOSIS — N281 Cyst of kidney, acquired: Secondary | ICD-10-CM | POA: Diagnosis not present

## 2011-11-25 DIAGNOSIS — K7689 Other specified diseases of liver: Secondary | ICD-10-CM | POA: Insufficient documentation

## 2011-11-25 DIAGNOSIS — R911 Solitary pulmonary nodule: Secondary | ICD-10-CM | POA: Diagnosis not present

## 2011-11-25 DIAGNOSIS — K769 Liver disease, unspecified: Secondary | ICD-10-CM

## 2011-11-25 LAB — CREATININE, SERUM
GFR calc Af Amer: 75 mL/min — ABNORMAL LOW (ref >90)
GFR calc non Af Amer: 65 mL/min — ABNORMAL LOW (ref >90)

## 2011-11-25 MED ORDER — IOHEXOL 300 MG/ML  SOLN
100.0000 mL | Freq: Once | INTRAMUSCULAR | Status: AC | PRN
  Administered 2011-11-25: 100 mL via INTRAVENOUS

## 2011-11-25 NOTE — Telephone Encounter (Signed)
Received a call from radiology that labs are held and she cannot release it.

## 2011-11-27 ENCOUNTER — Ambulatory Visit (HOSPITAL_COMMUNITY)
Admission: RE | Admit: 2011-11-27 | Discharge: 2011-11-27 | Disposition: A | Discharge status: Home / Self Care | Payer: Medicare Other | Source: Ambulatory Visit | Attending: Diagnostic Radiology | Admitting: Diagnostic Radiology

## 2011-11-27 ENCOUNTER — Other Ambulatory Visit (HOSPITAL_COMMUNITY): Payer: Self-pay | Admitting: Diagnostic Radiology

## 2011-11-27 DIAGNOSIS — K769 Liver disease, unspecified: Secondary | ICD-10-CM

## 2011-11-27 DIAGNOSIS — K449 Diaphragmatic hernia without obstruction or gangrene: Secondary | ICD-10-CM | POA: Diagnosis not present

## 2011-11-27 DIAGNOSIS — K7689 Other specified diseases of liver: Secondary | ICD-10-CM | POA: Diagnosis not present

## 2011-11-27 DIAGNOSIS — K7460 Unspecified cirrhosis of liver: Secondary | ICD-10-CM | POA: Diagnosis not present

## 2011-11-27 MED ORDER — IOHEXOL 300 MG/ML  SOLN
80.0000 mL | Freq: Once | INTRAMUSCULAR | Status: AC | PRN
  Administered 2011-11-27: 80 mL via INTRAVENOUS

## 2011-11-28 ENCOUNTER — Other Ambulatory Visit (HOSPITAL_COMMUNITY): Payer: BC Managed Care – PPO

## 2011-12-09 ENCOUNTER — Telehealth: Payer: Self-pay | Admitting: Gastroenterology

## 2011-12-09 NOTE — Telephone Encounter (Signed)
Pt is calling requesting her CT scan results. Please advise.

## 2011-12-10 NOTE — Telephone Encounter (Signed)
Spoke with pt and she is aware.

## 2011-12-10 NOTE — Telephone Encounter (Signed)
CT looks great.  Lesion no longer seen.  No f/u required.

## 2011-12-12 ENCOUNTER — Telehealth: Payer: Self-pay | Admitting: Gastroenterology

## 2011-12-12 NOTE — Telephone Encounter (Signed)
Pt states that ever since she drank the contrast for the CT scan she has had diarrhea. States she has to take Imodium when she leaves the house to keep from having diarrhea. Pt scheduled to see Willette Cluster 12/16/11@2 :30pm. Pt aware of appt date and time.

## 2011-12-12 NOTE — Telephone Encounter (Deleted)
Dr. Brodie will you accept this pt? 

## 2011-12-16 ENCOUNTER — Ambulatory Visit: Payer: BC Managed Care – PPO | Admitting: Nurse Practitioner

## 2011-12-24 ENCOUNTER — Telehealth: Payer: Self-pay | Admitting: *Deleted

## 2011-12-24 NOTE — Telephone Encounter (Signed)
Message copied by Marlowe Kays on Wed Dec 24, 2011  8:32 AM ------      Message from: Marlowe Kays      Created: Mon Sep 29, 2011  2:18 PM      Regarding: FW: MRI 6 months                   ----- Message -----         From: Merri Ray, CMA         Sent: 07/04/2011   2:29 PM           To: Merri Ray, CMA      Subject: MRI 6 months                                             Make sure pt has a follow up MRI in 6  months

## 2011-12-30 ENCOUNTER — Encounter: Payer: Self-pay | Admitting: Gastroenterology

## 2011-12-30 NOTE — Telephone Encounter (Signed)
Dr Arlyce Dice, I had a reminder for this patietn to have a MRI in 6 months She had a CT awhile ago. You stated no further workup.... So no MRI no longer needed ?.Marland KitchenMarland KitchenMarland KitchenMarland Kitchenjust double checking

## 2011-12-30 NOTE — Telephone Encounter (Signed)
No need for further imaging.  Please inform pt that scan looks fine and does not require any followup.

## 2011-12-30 NOTE — Progress Notes (Signed)
Repeat CT does not demonstrate and enhancing lesion.  No evidence for hepatoma.  No further w/u required.

## 2011-12-30 NOTE — Telephone Encounter (Signed)
This information has already been given to patient by Bonita Quin

## 2012-02-19 ENCOUNTER — Other Ambulatory Visit: Payer: Self-pay | Admitting: Obstetrics and Gynecology

## 2012-02-19 DIAGNOSIS — Z1231 Encounter for screening mammogram for malignant neoplasm of breast: Secondary | ICD-10-CM

## 2012-02-20 ENCOUNTER — Ambulatory Visit: Payer: BC Managed Care – PPO

## 2012-02-20 ENCOUNTER — Ambulatory Visit
Admission: RE | Admit: 2012-02-20 | Discharge: 2012-02-20 | Disposition: A | Discharge status: Home / Self Care | Payer: Medicare Other | Source: Ambulatory Visit | Attending: Obstetrics and Gynecology | Admitting: Obstetrics and Gynecology

## 2012-02-20 DIAGNOSIS — Z1231 Encounter for screening mammogram for malignant neoplasm of breast: Secondary | ICD-10-CM | POA: Diagnosis not present

## 2012-02-27 ENCOUNTER — Ambulatory Visit: Payer: BC Managed Care – PPO

## 2012-03-16 ENCOUNTER — Encounter: Payer: Self-pay | Admitting: Family

## 2012-03-16 ENCOUNTER — Ambulatory Visit (INDEPENDENT_AMBULATORY_CARE_PROVIDER_SITE_OTHER): Payer: Medicare Other | Admitting: Family

## 2012-03-16 VITALS — BP 116/70 | HR 86 | Temp 98.6°F | Wt 165.0 lb

## 2012-03-16 DIAGNOSIS — R42 Dizziness and giddiness: Secondary | ICD-10-CM | POA: Diagnosis not present

## 2012-03-16 DIAGNOSIS — R5383 Other fatigue: Secondary | ICD-10-CM | POA: Diagnosis not present

## 2012-03-16 DIAGNOSIS — R5381 Other malaise: Secondary | ICD-10-CM

## 2012-03-16 LAB — CBC WITH DIFFERENTIAL/PLATELET
Basophils Absolute: 0 10*3/uL (ref 0.0–0.1)
Basophils Relative: 0.7 % (ref 0.0–3.0)
Eosinophils Relative: 1.8 % (ref 0.0–5.0)
Hemoglobin: 11 g/dL — ABNORMAL LOW (ref 12.0–15.0)
Lymphocytes Relative: 27.8 % (ref 12.0–46.0)
Monocytes Relative: 12.9 % — ABNORMAL HIGH (ref 3.0–12.0)
Neutro Abs: 3.1 10*3/uL (ref 1.4–7.7)
RBC: 3.76 Mil/uL — ABNORMAL LOW (ref 3.87–5.11)
RDW: 17.6 % — ABNORMAL HIGH (ref 11.5–14.6)
WBC: 5.5 10*3/uL (ref 4.5–10.5)

## 2012-03-16 MED ORDER — MECLIZINE HCL 12.5 MG PO TABS: 12.5000 mg | ORAL_TABLET | Freq: Three times a day (TID) | ORAL | Status: AC | PRN

## 2012-03-16 NOTE — Patient Instructions (Addendum)
Vertigo Vertigo means you feel like you or your surroundings are moving when they are not. Vertigo can be dangerous if it occurs when you are at work, driving, or performing difficult activities.  CAUSES  Vertigo occurs when there is a conflict of signals sent to your brain from the visual and sensory systems in your body. There are many different causes of vertigo, including:  Infections, especially in the inner ear.   A bad reaction to a drug or misuse of alcohol and medicines.   Withdrawal from drugs or alcohol.   Rapidly changing positions, such as lying down or rolling over in bed.   A migraine headache.   Decreased blood flow to the brain.   Increased pressure in the brain from a head injury, infection, tumor, or bleeding.  SYMPTOMS  You may feel as though the world is spinning around or you are falling to the ground. Because your balance is upset, vertigo can cause nausea and vomiting. You may have involuntary eye movements (nystagmus). DIAGNOSIS  Vertigo is usually diagnosed by physical exam. If the cause of your vertigo is unknown, your caregiver may perform imaging tests, such as an MRI scan (magnetic resonance imaging). TREATMENT  Most cases of vertigo resolve on their own, without treatment. Depending on the cause, your caregiver may prescribe certain medicines. If your vertigo is related to body position issues, your caregiver may recommend movements or procedures to correct the problem. In rare cases, if your vertigo is caused by certain inner ear problems, you may need surgery. HOME CARE INSTRUCTIONS   Follow your caregiver's instructions.   Avoid driving.   Avoid operating heavy machinery.   Avoid performing any tasks that would be dangerous to you or others during a vertigo episode.   Tell your caregiver if you notice that certain medicines seem to be causing your vertigo. Some of the medicines used to treat vertigo episodes can actually make them worse in some  people.  SEEK IMMEDIATE MEDICAL CARE IF:   Your medicines do not relieve your vertigo or are making it worse.   You develop problems with talking, walking, weakness, or using your arms, hands, or legs.   You develop severe headaches.   Your nausea or vomiting continues or gets worse.   You develop visual changes.   A family member notices behavioral changes.   Your condition gets worse.  MAKE SURE YOU:  Understand these instructions.   Will watch your condition.   Will get help right away if you are not doing well or get worse.  Document Released: 05/21/2005 Document Revised: 07/31/2011 Document Reviewed: 02/27/2011 ExitCare Patient Information 2012 [employer]. 

## 2012-03-17 ENCOUNTER — Encounter: Payer: Self-pay | Admitting: Family

## 2012-03-17 NOTE — Progress Notes (Signed)
Subjective:    Patient ID: Brenda [name], female    DOB: 07-07-1946, 66 y.o.   MRN: [id number]  HPI 66 year old white female, nonsmoker, patient of Dr. Cato [name] is in today with complaints of feeling dizzy and fatigue x3 days. Reports pressure in her for a. Denies any sneezing, coughing, or congestion. She's had similar symptoms of vertigo in the past related to an iron deficiency. Denies any   blurred vision, double vision, shortness of breath, chest pain.   Review of Systems  Constitutional: Positive for fatigue. Negative for fever.  HENT: Negative.   Respiratory: Negative.   Cardiovascular: Negative.   Gastrointestinal: Negative.   Musculoskeletal: Negative.   Skin: Negative.   Neurological: Positive for dizziness and light-headedness. Negative for weakness.  Hematological: Negative.   Psychiatric/Behavioral: Negative.    Past Medical History  Diagnosis Date  . Arthritis   . GERD (gastroesophageal reflux disease)   . Hyperlipidemia   . Hiatal hernia   . Bursitis     History   Social History  . Marital Status: Married    Spouse Name: N/A    Number of Children: 0  . Years of Education: N/A   Occupational History  . Retired    Social History Main Topics  . Smoking status: Former Smoker    Quit date: 08/25/1984  . Smokeless tobacco: Never Used  . Alcohol Use: Yes     Rare  . Drug Use: No  . Sexually Active: Not on file   Other Topics Concern  . Not on file   Social History Narrative  . No narrative on file    Past Surgical History  Procedure Date  . Abdominal hysterectomy   . Oophorectomy   . Tonsillectomy   . Nephrectomy     Family History  Problem Relation Age of Onset  . Leukemia Father   . Colon cancer Neg Hx   . Thyroid cancer Mother     Allergies  Allergen Reactions  . Codeine     REACTION: nausea---CNS side effects  . Nitrofurantoin     REACTION: sick--? hepatitis    Current Outpatient Prescriptions on File Prior to Visit    Medication Sig Dispense Refill  . aspirin 81 MG tablet Take 81 mg by mouth daily.        . calcium-vitamin D (OSCAL WITH D 500-200) 500-200 MG-UNIT per tablet Take 1 tablet by mouth daily.        . ergocalciferol (VITAMIN D2) 50000 UNITS capsule Take 50,000 Units by mouth every 30 (thirty) days.        Marland Kitchen estradiol (VAGIFEM) 25 MCG vaginal tablet Place 25 mcg vaginally 2 (two) times a week.        . estradiol (VIVELLE-DOT) 0.075 MG/24HR Place 1 patch onto the skin as directed.        . Multiple Vitamin (MULTIVITAMIN) tablet Take 1 tablet by mouth daily.        Marland Kitchen omeprazole (PRILOSEC) 40 MG capsule Take 40 mg by mouth daily.        Marland Kitchen PATADAY 0.2 % SOLN Place 1 drop into both eyes as needed.      Marland Kitchen lisinopril (PRINIVIL,ZESTRIL) 20 MG tablet Take 1 tablet (20 mg total) by mouth daily.  90 tablet  3  . trimethoprim (TRIMPEX) 100 MG tablet Take 1 tablet by mouth daily.        BP 116/70  Pulse 86  Temp 98.6 F (37 C) (Oral)  Wt 165 lb (74.844 kg)  SpO2 98%chart    Objective:   Physical Exam  Constitutional: She is oriented to person, place, and time. She appears well-developed and well-nourished.  HENT:  Right Ear: External ear normal.  Left Ear: External ear normal.  Nose: Nose normal.  Mouth/Throat: Oropharynx is clear and moist.  Neck: Normal range of motion. Neck supple.  Cardiovascular: Normal rate, regular rhythm and normal heart sounds.   Pulmonary/Chest: Effort normal and breath sounds normal.  Musculoskeletal: Normal range of motion.  Neurological: She is alert and oriented to person, place, and time. She has normal reflexes. She displays normal reflexes. A cranial nerve deficit is present. Coordination normal.  Skin: Skin is warm and dry.  Psychiatric: She has a normal mood and affect.          Assessment & Plan:   Assessment: Fatigue, vertigo, iron deficiency anemia  Plan: Lab sent to include BMP and CBC will notify patient pending results. Antivert faxed to the  pharmacy. Call the office symptoms worsen or persist. Recheck a schedule, and when necessary.

## 2012-05-18 DIAGNOSIS — Z23 Encounter for immunization: Secondary | ICD-10-CM | POA: Diagnosis not present

## 2012-06-03 DIAGNOSIS — Z01419 Encounter for gynecological examination (general) (routine) without abnormal findings: Secondary | ICD-10-CM | POA: Diagnosis not present

## 2012-06-03 DIAGNOSIS — Z124 Encounter for screening for malignant neoplasm of cervix: Secondary | ICD-10-CM | POA: Diagnosis not present

## 2012-06-03 DIAGNOSIS — Z0000 Encounter for general adult medical examination without abnormal findings: Secondary | ICD-10-CM | POA: Diagnosis not present

## 2012-06-11 DIAGNOSIS — C649 Malignant neoplasm of unspecified kidney, except renal pelvis: Secondary | ICD-10-CM | POA: Diagnosis not present

## 2012-06-11 DIAGNOSIS — N393 Stress incontinence (female) (male): Secondary | ICD-10-CM | POA: Diagnosis not present

## 2012-06-16 ENCOUNTER — Telehealth: Payer: Self-pay | Admitting: Internal Medicine

## 2012-06-16 ENCOUNTER — Encounter: Payer: Self-pay | Admitting: Family Medicine

## 2012-06-16 ENCOUNTER — Ambulatory Visit (INDEPENDENT_AMBULATORY_CARE_PROVIDER_SITE_OTHER): Payer: Medicare Other | Admitting: Family Medicine

## 2012-06-16 VITALS — BP 108/70 | HR 74 | Temp 98.0°F | Wt 162.0 lb

## 2012-06-16 DIAGNOSIS — R197 Diarrhea, unspecified: Secondary | ICD-10-CM

## 2012-06-16 NOTE — Telephone Encounter (Signed)
Caller: Ethlyn/Patient; Patient Name: Brenda [name]; PCP: Birdie Sons (Adults only); Best Callback Phone Number: [phone]  Patient states she developed intermittent diarrhea, onset 06/09/12. States loose, black stools X 9 06/15/12. States loose stool X 1 06/16/12. Afebrile. Denies nausea or vomiting. Urinating normally for patient. States mild, intermittent dizziness. Triage per Gastrointestinal Bleeding Protocol. No emergent sx identified. Care advice given per guidelines related to positive triage assessment for " New change in bowel movements to black, tarry stool." Appt. scheduled within 4 hours, per guidelines. Appt. scheduled for 06/16/12 1600 with Dr. Gershon Crane.

## 2012-06-17 LAB — CBC WITH DIFFERENTIAL/PLATELET
Basophils Absolute: 0.1 10*3/uL (ref 0.0–0.1)
Eosinophils Absolute: 0.2 10*3/uL (ref 0.0–0.7)
HCT: 39.9 % (ref 36.0–46.0)
Hemoglobin: 12.9 g/dL (ref 12.0–15.0)
Lymphs Abs: 2.5 10*3/uL (ref 0.7–4.0)
MCHC: 32.3 g/dL (ref 30.0–36.0)
Monocytes Absolute: 0.8 10*3/uL (ref 0.1–1.0)
Neutro Abs: 7 10*3/uL (ref 1.4–7.7)
RDW: 15.2 % — ABNORMAL HIGH (ref 11.5–14.6)

## 2012-06-17 NOTE — Progress Notes (Signed)
  Subjective:    Patient ID: Brenda [name], female    DOB: 18-Sep-1945, 66 y.o.   MRN: [id number]  HPI Here for about 3 weeks of dark loose stools. She may have 2-4 loose stools for a day or two, and then go a day or two with no stools. Every stool is loose but not watery. They are dark brown but not black. She has no nausea or vomiting, no fever, no change in appetite. She has mild generalized cramps from time to time but no real pain. No heartburn or indigestion. She has well water at her house, but her husband feels fine. No recent travel and no recent antibiotic use. She has known diverticulosis from a colonoscopy in 2006 but has never had diverticulitis.    Review of Systems  Constitutional: Negative.   Respiratory: Negative.   Cardiovascular: Negative.   Gastrointestinal: Positive for diarrhea. Negative for nausea, vomiting, abdominal pain, constipation, abdominal distention, anal bleeding and rectal pain.  Genitourinary: Negative.        Objective:   Physical Exam  Constitutional: She appears well-developed and well-nourished.  Abdominal: Soft. Bowel sounds are normal. She exhibits no distension and no mass. There is no tenderness. There is no rebound and no guarding.  Genitourinary:       On rectal exam, there is no tenderness or mass. No stool is present in the vault. There is a small amount of mucus present which is heme negative.           Assessment & Plan:  This may represent diverticular bleeding. Get labs today including a CBC. Refer to GI.

## 2012-06-18 LAB — BASIC METABOLIC PANEL
BUN: 11 mg/dL (ref 6–23)
CO2: 26 mEq/L (ref 19–32)
Chloride: 102 mEq/L (ref 96–112)
Creatinine, Ser: 0.9 mg/dL (ref 0.4–1.2)

## 2012-06-18 LAB — HEPATIC FUNCTION PANEL
Alkaline Phosphatase: 54 U/L (ref 39–117)
Bilirubin, Direct: 0.1 mg/dL (ref 0.0–0.3)
Total Protein: 8.1 g/dL (ref 6.0–8.3)

## 2012-06-18 LAB — AMYLASE: Amylase: 38 U/L (ref 27–131)

## 2012-06-23 NOTE — Progress Notes (Signed)
Quick Note:  I left voice message with results. ______ 

## 2012-11-18 ENCOUNTER — Telehealth: Payer: Self-pay | Admitting: Nurse Practitioner

## 2012-11-18 NOTE — Telephone Encounter (Signed)
PATIENT THINKS SHE HAS A YEAST AND OR BLADDER INFECTION. PATIENT WOULD LIKE APPOINTMENT TOMORROW AFTER /NR

## 2012-11-19 ENCOUNTER — Encounter: Payer: Self-pay | Admitting: Nurse Practitioner

## 2012-11-19 ENCOUNTER — Ambulatory Visit: Payer: Self-pay | Admitting: Nurse Practitioner

## 2012-11-19 NOTE — Telephone Encounter (Signed)
Spoke to pt who thinks she may just be irritated due to switching to another kind of pad. Does not think it is yeast after all. Pt plans to go back to old brand of pad and see if it clears up over the weekend. Pt to call back Monday if OV needed. aa

## 2012-11-23 ENCOUNTER — Telehealth: Payer: Self-pay | Admitting: Nurse Practitioner

## 2012-11-23 ENCOUNTER — Ambulatory Visit (INDEPENDENT_AMBULATORY_CARE_PROVIDER_SITE_OTHER): Payer: Medicare Other | Admitting: Certified Nurse Midwife

## 2012-11-23 ENCOUNTER — Encounter: Payer: Self-pay | Admitting: Certified Nurse Midwife

## 2012-11-23 VITALS — BP 120/64 | HR 68 | Resp 16

## 2012-11-23 DIAGNOSIS — N952 Postmenopausal atrophic vaginitis: Secondary | ICD-10-CM | POA: Diagnosis not present

## 2012-11-23 DIAGNOSIS — N390 Urinary tract infection, site not specified: Secondary | ICD-10-CM

## 2012-11-23 DIAGNOSIS — N760 Acute vaginitis: Secondary | ICD-10-CM

## 2012-11-23 DIAGNOSIS — B3731 Acute candidiasis of vulva and vagina: Secondary | ICD-10-CM | POA: Diagnosis not present

## 2012-11-23 DIAGNOSIS — B373 Candidiasis of vulva and vagina: Secondary | ICD-10-CM

## 2012-11-23 DIAGNOSIS — B372 Candidiasis of skin and nail: Secondary | ICD-10-CM

## 2012-11-23 DIAGNOSIS — Z87440 Personal history of urinary (tract) infections: Secondary | ICD-10-CM

## 2012-11-23 LAB — POCT URINALYSIS DIPSTICK
Ketones, UA: NEGATIVE
Protein, UA: NEGATIVE
pH, UA: 5

## 2012-11-23 MED ORDER — TERCONAZOLE 0.4 % VA CREA: 1.0000 | TOPICAL_CREAM | Freq: Every day | VAGINAL | Status: DC

## 2012-11-23 NOTE — Progress Notes (Signed)
67 y.o.MarriedCaucasian female [id number] with a 6 day(s) history of the following:local irritation Sexually active: yes Last sexual activity:13days ago. Pt also reports the following associated symptoms: none Patient hastried over the counter treatment with transient relief.  Also using Silvadene and 0.1% Triamcinolone cream with some relief.  Spent one week sitting in hospital with mother's illness, with pad use in case of occasional leaking.  No new personal products. Has been going without underwear when possible  O: Healthy female  WN WD Orientation x 3    Exam:  Ext:Red tender with scaling, no lesions                ZOX:[id number]: white and thin, pH 4.0, wet prep done, positive yeast vaginal and positive yeast externally                Cx:  absent                Uterus:absent                Adnexa: absent  No masses  A: Yeast Vaginitis Yeast Dermatitis Atrophic Vaginitis uses Vagifem 2 x weekly History of UTI   WJ:[id number] local care discussed. Transport planner distributed. Vaginal antifungal see orders. Rx Terazol 7 Cream every hs x7 no refills with instructions Continue Vagifem use Aveeno sitz bath prn comfort. Avoid pad use as much as possible Urine culture  Rv prn     Reviewed, TL

## 2012-11-23 NOTE — Telephone Encounter (Signed)
Spoke with pt who reports dark smelly urine and discomfort. Pt thought it was just a reaction to a new brand of pad, but after going all weekend without a pad, she is not feeling better. Sched OV with DL at 1610. aa

## 2012-11-23 NOTE — Patient Instructions (Signed)
Vaginitis  Vaginitis in a soreness, swelling and redness (inflammation) of the vagina and vulva. This is not a sexually transmitted infection.   CAUSES   Yeast vaginitis is caused by yeast (candida) that is normally found in your vagina. With a yeast infection, the candida has over grown in number to a point that upsets the chemical balance.  SYMPTOMS    White thick vaginal discharge.   Swelling, itching, redness and irritation of the vagina and possibly the lips of the vagina (vulva).   Burning or painful urination.   Painful intercourse.  HOME CARE INSTRUCTIONS    Finish all medication as prescribed.   Do not have sex until treatment is completed or instructed by your healthcare giver.   Take warm sitz baths.   Do not douche.   Do not use tampons, especially scented ones.   Wear cotton underwear.   Avoid tight pants and panty hose.   Tell your sexual partner that you have a yeast infection. They should go to their caregiver if they have symptoms such as mild rash or itching.   Your sexual partner should be treated if your infection is difficult to eliminate.   Practice safer sex. Use condoms.   Some vaginal medications cause latex condoms to fail. Ask your caregiver this.  SEEK MEDICAL CARE IF:    You develop a fever.   The infection is getting worse after 2 days of treatment.   The infection is not getting better after 3 days of treatment.   You develop blisters in or around your vagina.   You develop vaginal bleeding, and it is not your menstrual period.   You have pain when you urinate.   You develop intestinal problems.   You have pain with sexual intercourse.  Document Released: 09/18/2004 Document Revised: 07/31/2011 Document Reviewed: 04/26/2009  Exit[employer] Patient Information 2012 [employer].

## 2012-11-23 NOTE — Telephone Encounter (Signed)
PT MAY HAVE A YEAST INFECTION OR UTI. PLEASE CALL TO SCHEDULE AN APPT.

## 2012-11-24 DIAGNOSIS — N390 Urinary tract infection, site not specified: Secondary | ICD-10-CM | POA: Diagnosis not present

## 2012-11-26 ENCOUNTER — Other Ambulatory Visit: Payer: Self-pay | Admitting: Certified Nurse Midwife

## 2012-11-26 DIAGNOSIS — N390 Urinary tract infection, site not specified: Secondary | ICD-10-CM

## 2012-11-26 MED ORDER — CIPROFLOXACIN HCL 500 MG PO TABS: 500.0000 mg | ORAL_TABLET | Freq: Two times a day (BID) | ORAL | Status: AC

## 2012-11-29 NOTE — Telephone Encounter (Signed)
Pt states she still has a yeast infection

## 2012-11-30 ENCOUNTER — Ambulatory Visit (INDEPENDENT_AMBULATORY_CARE_PROVIDER_SITE_OTHER): Payer: Medicare Other | Admitting: Nurse Practitioner

## 2012-11-30 ENCOUNTER — Telehealth: Payer: Self-pay | Admitting: Nurse Practitioner

## 2012-11-30 ENCOUNTER — Encounter: Payer: Self-pay | Admitting: Nurse Practitioner

## 2012-11-30 VITALS — BP 110/70 | HR 66 | Wt 170.0 lb

## 2012-11-30 DIAGNOSIS — B373 Candidiasis of vulva and vagina: Secondary | ICD-10-CM

## 2012-11-30 DIAGNOSIS — B3731 Acute candidiasis of vulva and vagina: Secondary | ICD-10-CM | POA: Diagnosis not present

## 2012-11-30 LAB — POCT WET PREP (WET MOUNT)

## 2012-11-30 MED ORDER — TERCONAZOLE 0.4 % VA CREA: 1.0000 | TOPICAL_CREAM | Freq: Every day | VAGINAL | Status: AC

## 2012-11-30 MED ORDER — FLUCONAZOLE 150 MG PO TABS: 150.0000 mg | ORAL_TABLET | Freq: Once | ORAL | Status: DC

## 2012-11-30 NOTE — Progress Notes (Signed)
Subjective:     Patient ID: Brenda [name], female   DOB: 12/07/1945, 67 y.o.   MRN: [id number]  HPI Patient with symptoms times two weeks of yeast vaginitis.  She has burning and itching externally and internally.  Saw Sara Chu CNM on 4/1 and diagnosed with yeast.  She ws treated with Terazol vag cream hs X 7.  She had mild urinary symptoms  and urine culture came back as > 100,000 colonies of Klebsiella. She then started 5 day treatment of Cipro used for a UTI- has 1 more day med's.    Currently yeast symptoms are worse with itching, burning, and feeling raw. Not sexually since onset of symptoms.   Most recently has sat at the hospital with her mom during as illness, she is at home now. Going to [facility]. To visit husbands family on Thursday.  Review of Systems  Constitutional: Negative for fever, chills and fatigue.  Respiratory: Negative.   Cardiovascular: Negative.   Gastrointestinal: Negative.  Negative for nausea, vomiting, diarrhea and constipation.  Genitourinary: Positive for urgency, frequency, vaginal discharge and vaginal pain. Negative for difficulty urinating.       Some increase in urgency and frequency related to an increase in po fluids.  Musculoskeletal: Positive for myalgias.       Bilateral hip and knee pain with onset same time of infection.  Psychiatric/Behavioral: Negative.        Objective:   Physical Exam  Constitutional: She is oriented to person, place, and time. She appears well-developed and well-nourished.  Pulmonary/Chest: Effort normal.  Abdominal: Soft. She exhibits no distension and no mass. There is no tenderness. There is no rebound and no guarding.  Genitourinary:  External vulva with redness, white thick vaginal discharge.  Neurological: She is alert and oriented to person, place, and time.  Skin: Skin is warm and dry.  Psychiatric: She has a normal mood and affect. Her behavior is normal. Thought content normal.       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 Recent UTI still on med's    Plan:    Will start on Diflucan 150 mg today and repeat in 48 hours  Refilled Terazol Vag. Cream to have on hand while traveling.

## 2012-11-30 NOTE — Patient Instructions (Addendum)
Monilial Vaginitis Vaginitis in a soreness, swelling and redness (inflammation) of the vagina and vulva. Monilial vaginitis is not a sexually transmitted infection. CAUSES  Yeast vaginitis is caused by yeast (candida) that is normally found in your vagina. With a yeast infection, the candida has overgrown in number to a point that upsets the chemical balance. SYMPTOMS   White, thick vaginal discharge.  Swelling, itching, redness and irritation of the vagina and possibly the lips of the vagina (vulva).  Burning or painful urination.  Painful intercourse. DIAGNOSIS  Things that may contribute to monilial vaginitis are:  Postmenopausal and virginal states.  Pregnancy.  Infections.  Being tired, sick or stressed, especially if you had monilial vaginitis in the past.  Diabetes. Good control will help lower the chance.  Birth control pills.  Tight fitting garments.  Using bubble bath, feminine sprays, douches or deodorant tampons.  Taking certain medications that kill germs (antibiotics).  Sporadic recurrence can occur if you become ill. TREATMENT  Your caregiver will give you medication.  There are several kinds of anti monilial vaginal creams and suppositories specific for monilial vaginitis. For recurrent yeast infections, use a suppository or cream in the vagina 2 times a week, or as directed.  Anti-monilial or steroid cream for the itching or irritation of the vulva may also be used. Get your caregiver's permission.  Painting the vagina with methylene blue solution may help if the monilial cream does not work.  Eating yogurt may help prevent monilial vaginitis. HOME CARE INSTRUCTIONS   Finish all medication as prescribed.  Do not have sex until treatment is completed or after your caregiver tells you it is okay.  Take warm sitz baths.  Do not douche.  Do not use tampons, especially scented ones.  Wear cotton underwear.  Avoid tight pants and panty  hose.  Tell your sexual partner that you have a yeast infection. They should go to their caregiver if they have symptoms such as mild rash or itching.  Your sexual partner should be treated as well if your infection is difficult to eliminate.  Practice safer sex. Use condoms.  Some vaginal medications cause latex condoms to fail. Vaginal medications that harm condoms are:  Cleocin cream.  Butoconazole (Femstat).  Terconazole (Terazol) vaginal suppository.  Miconazole (Monistat) (may be purchased over the counter). SEEK MEDICAL CARE IF:   You have a temperature by mouth above 102 F (38.9 C).  The infection is getting worse after 2 days of treatment.  The infection is not getting better after 3 days of treatment.  You develop blisters in or around your vagina.  You develop vaginal bleeding, and it is not your menstrual period.  You have pain when you urinate.  You develop intestinal problems.  You have pain with sexual intercourse. Document Released: 05/21/2005 Document Revised: 11/03/2011 Document Reviewed: 02/02/2009 ExitCare Patient Information 2013 [employer].  

## 2012-11-30 NOTE — Progress Notes (Deleted)
67 y.o. G0P0000 Married{Race/ethnicity:17218}F here for annual exam.    Patient's last menstrual period was 08/25/1992.          Sexually active: {yes no:314532}  The current method of family planning is {contraception:315051}.    Exercising: {yes no:314532}  {types:19826} Smoker:  {YES NO:22349}  Health Maintenance: Pap:  *** MMG:  *** Colonoscopy:  *** BMD:   *** TDaP:  *** Labs: ***   reports that she quit smoking about 28 years ago. She has never used smokeless tobacco. She reports that she does not drink alcohol or use illicit drugs.  Past Medical History  Diagnosis Date  . Arthritis   . GERD (gastroesophageal reflux disease)   . Hyperlipidemia   . Hiatal hernia   . Bursitis   . Diverticulitis 11/2004  . Endometriosis   . Cancer     kidney cancer    Past Surgical History  Procedure Laterality Date  . Oophorectomy    . Tonsillectomy    . Nephrectomy  04/16/09    Partial removal due to cancer  . Lung biopsy  1996    Negative  . Abdominal hysterectomy      TAH/BSO due to endometriosis    Current Outpatient Prescriptions  Medication Sig Dispense Refill  . aspirin 81 MG tablet Take 81 mg by mouth daily.        . calcium-vitamin D (OSCAL WITH D 500-200) 500-200 MG-UNIT per tablet Take 1 tablet by mouth daily.        . ciprofloxacin (CIPRO) 500 MG tablet Take 1 tablet (500 mg total) by mouth 2 (two) times daily.  10 tablet  0  . ergocalciferol (VITAMIN D2) 50000 UNITS capsule Take 50,000 Units by mouth every 30 (thirty) days.        Marland Kitchen estradiol (VAGIFEM) 25 MCG vaginal tablet Place 25 mcg vaginally 2 (two) times a week.        . estradiol (VIVELLE-DOT) 0.075 MG/24HR Place 1 patch onto the skin 2 (two) times a week.       . Multiple Vitamin (MULTIVITAMIN) tablet Take 1 tablet by mouth daily.        Marland Kitchen omeprazole (PRILOSEC) 40 MG capsule Take 40 mg by mouth 2 (two) times daily.       Marland Kitchen PATADAY 0.2 % SOLN Place 1 drop into both eyes as needed.      . silver sulfADIAZINE  (SILVADENE) 1 % cream Apply topically as needed.      Marland Kitchen terconazole (TERAZOL 7) 0.4 % vaginal cream Place 1 applicator vaginally at bedtime. One applicator full QHS for seven days of therapy  45 g  0  . triamcinolone cream (KENALOG) 0.1 % Apply topically as needed.       No current facility-administered medications for this visit.    Family History  Problem Relation Age of Onset  . Cancer Father   . Colon cancer Neg Hx   . Thyroid cancer Mother     ROS:  Pertinent items are noted in HPI.  Otherwise, a comprehensive ROS was negative.  Exam:   LMP 08/25/1992  Weight change: @WEIGHTCHANGE @ Height:      Ht Readings from Last 3 Encounters:  08/20/11 5' 3.5" (1.613 m)  06/05/11 5\' 3"  (1.6 m)  10/22/10 5\' 3"  (1.6 m)    General appearance: alert, cooperative and appears stated age Head: Normocephalic, without obvious abnormality, atraumatic Neck: no adenopathy, supple, symmetrical, trachea midline and thyroid {EXAM; THYROID:18604} Lungs: clear to auscultation bilaterally Breasts: {Exam; breast:13139::"normal  appearance, no masses or tenderness"} Heart: regular rate and rhythm Abdomen: soft, non-tender; bowel sounds normal; no masses,  no organomegaly Extremities: extremities normal, atraumatic, no cyanosis or edema Skin: Skin color, texture, turgor normal. No rashes or lesions Lymph nodes: Cervical, supraclavicular, and axillary nodes normal. No abnormal inguinal nodes palpated Neurologic: Grossly normal   Pelvic: External genitalia:  no lesions              Urethra:  normal appearing urethra with no masses, tenderness or lesions              Bartholins and Skenes: normal                 Vagina: normal appearing vagina with normal color and discharge, no lesions              Cervix: {exam; cervix:14595}              Pap taken: {yes no:314532} Bimanual Exam:  Uterus:  {exam; uterus:12215}              Adnexa: {exam; adnexa:12223}               Rectovaginal: Confirms                Anus:  normal sphincter tone, no lesions  A:  Well Woman with normal exam  P:   {plan; gyn:5269::"mammogram","pap smear","return annually or prn"}  An After Visit Summary was printed and given to the patient.

## 2012-11-30 NOTE — Telephone Encounter (Signed)
Cancel.

## 2012-12-01 NOTE — Progress Notes (Signed)
Encounter reviewed by Dr. Deshaun Weisinger Silva.  

## 2012-12-13 ENCOUNTER — Encounter: Payer: Self-pay | Admitting: Certified Nurse Midwife

## 2012-12-14 ENCOUNTER — Ambulatory Visit (INDEPENDENT_AMBULATORY_CARE_PROVIDER_SITE_OTHER): Payer: Medicare Other | Admitting: Certified Nurse Midwife

## 2012-12-14 ENCOUNTER — Encounter: Payer: Self-pay | Admitting: Certified Nurse Midwife

## 2012-12-14 VITALS — BP 114/60 | Temp 97.5°F | Resp 16

## 2012-12-14 DIAGNOSIS — B373 Candidiasis of vulva and vagina: Secondary | ICD-10-CM

## 2012-12-14 DIAGNOSIS — B3731 Acute candidiasis of vulva and vagina: Secondary | ICD-10-CM | POA: Diagnosis not present

## 2012-12-14 DIAGNOSIS — N390 Urinary tract infection, site not specified: Secondary | ICD-10-CM

## 2012-12-14 DIAGNOSIS — B372 Candidiasis of skin and nail: Secondary | ICD-10-CM

## 2012-12-14 LAB — POCT URINALYSIS DIPSTICK
Bilirubin, UA: NEGATIVE
Blood, UA: NEGATIVE
Glucose, UA: NEGATIVE
Nitrite, UA: NEGATIVE

## 2012-12-14 NOTE — Progress Notes (Signed)
67 y.o. Married Caucasian female G0P0000 here for follow up of yeast & uti treated with cipro, diflucan & terconazole initiated on April 8,2014. Completed all medication as directed.  Denies any symptoms of urinary frequency or urgency or pain."  Still just doesn't feel right" was seen a week later here for yeast vaginitis with symptoms improving. Patient complaining as urine touches skin and when she feels the need to urinate.  Occasional vaginal itching noted now. Denies fever, chills, backache, headache, vaginal discharge or odor.  No new personal products. Denies vaginal dryness or bleeding. Use Vagifem twice weekly for vaginal drynes.    O: Healthy WD,WN female Affect:Normal, orientation x 3  Skin:warm and dry Abdomen:soft, nontender, negative suprapubic pain Pelvic exam:EXTERNAL GENITALIA: Slight increase pink color of skin with scant exudate wet prep taken BUS: negative Urethra/Bladder: non tender Vagina:Moist white discharge Perineal area: slight increase in pink color of skin  A:UTI questionable resolved (urine dipstick essentially negative) Yeast dermatitis   P: Discussed findings of exam and UTI probably resolved.  Instructed to start on Azo Cranberry OTC for next 3 days to see if burning sensation stops, Increase water intake so urine color is clear. Lab urine microscopic, urine culture Reviewed findings of external yeast patient states she has Rx Mycolog given before and has almost full tube, Instructed to use bid x 5 days Aveeno sitz bath prn comfort. If not resolving in one week advise  Rv prn      Reviewed, TL

## 2012-12-15 LAB — URINALYSIS, MICROSCOPIC ONLY: Squamous Epithelial / LPF: NONE SEEN

## 2012-12-16 LAB — URINE CULTURE
Colony Count: NO GROWTH
Organism ID, Bacteria: NO GROWTH

## 2012-12-22 ENCOUNTER — Other Ambulatory Visit: Payer: Medicare Other

## 2012-12-22 ENCOUNTER — Ambulatory Visit (INDEPENDENT_AMBULATORY_CARE_PROVIDER_SITE_OTHER): Payer: Medicare Other | Admitting: Gastroenterology

## 2012-12-22 ENCOUNTER — Encounter: Payer: Self-pay | Admitting: Gastroenterology

## 2012-12-22 VITALS — BP 120/70 | HR 84 | Ht 63.39 in | Wt 166.0 lb

## 2012-12-22 DIAGNOSIS — R197 Diarrhea, unspecified: Secondary | ICD-10-CM | POA: Diagnosis not present

## 2012-12-22 NOTE — Assessment & Plan Note (Addendum)
Five-week history of intermittent severe diarrhea. Symptoms began while she accompanied her mother during her hospitalization and she subsequently took antibiotics. Pseudomembranous colitis should be ruled out. Enteric infection is a concern as well.  Recommendations #1 check GI pathogen panel and stool for C. difficile toxin #2 sigmoidoscopy if #1 is not diagnostic

## 2012-12-22 NOTE — Patient Instructions (Addendum)
Go to the basement for labs today Take Flora Q OTC once daily

## 2012-12-22 NOTE — Progress Notes (Signed)
History of Present Illness: Mrs. Sievert has returned for evaluation of diarrhea. Over the past 5 weeks she's been suffering from intermittent diarrhea consisting of 10-15 bowel movements during the day and night that will occur 1-3 times a week. In between she is feeling well. Approximately 5 weeks ago she spent 4-5 nights in the hospital accompaning her mother. She was subsequently placed on Cipro for UTI. Diarrhea preceded antibiotic therapy. She denies rectal bleeding or melena. Last colonoscopy in 2006 demonstrated diverticulosis.    Review of Systems: She's had a vaginal yeast infection has been treated several times. She still has some discharge. Pertinent positive and negative review of systems were noted in the above HPI section. All other review of systems were otherwise negative.    Current Medications, Allergies, Past Medical History, Past Surgical History, Family History and Social History were reviewed in [facility] electronic medical record  Vital signs were reviewed in today's medical record. Physical Exam: General: Well developed , well nourished, no acute distress Skin: anicteric Head: Normocephalic and atraumatic Eyes:  sclerae anicteric, EOMI Ears: Normal auditory acuity Mouth: No deformity or lesions Lungs: Clear throughout to auscultation Heart: Regular rate and rhythm; no murmurs, rubs or bruits Abdomen: Soft, non tender and non distended. No masses, hepatosplenomegaly or hernias noted. Normal Bowel sounds Rectal:deferred Musculoskeletal: Symmetrical with no gross deformities  Pulses:  Normal pulses noted Extremities: No clubbing, cyanosis, edema or deformities noted Neurological: Alert oriented x 4, grossly nonfocal Psychological:  Alert and cooperative. Normal mood and affect

## 2012-12-23 ENCOUNTER — Other Ambulatory Visit: Payer: Medicare Other

## 2012-12-23 DIAGNOSIS — R197 Diarrhea, unspecified: Secondary | ICD-10-CM | POA: Diagnosis not present

## 2012-12-23 LAB — GASTROINTESTINAL PATHOGEN PANEL PCR
Campylobacter, PCR: NEGATIVE
Cryptosporidium, PCR: NEGATIVE
E coli (ETEC) LT/ST PCR: NEGATIVE
Giardia lamblia, PCR: NEGATIVE
Norovirus, PCR: NEGATIVE
Rotavirus A, PCR: NEGATIVE

## 2012-12-23 LAB — GLIADIN ANTIBODIES, SERUM: Gliadin IgA: 8.6 U/mL (ref <20)

## 2012-12-23 NOTE — Progress Notes (Signed)
Quick Note:  Please inform the patient that lab work was normal and to continue current plan of action ______ 

## 2012-12-24 LAB — RETICULIN ANTIBODIES, IGA W TITER: Reticulin Ab, IgA: NEGATIVE

## 2012-12-29 ENCOUNTER — Telehealth: Payer: Self-pay

## 2012-12-29 NOTE — Telephone Encounter (Signed)
Pt aware. Pt scheduled for previsit and flex-sig in the [facility]. Pt aware of appt dates and times.

## 2012-12-29 NOTE — Telephone Encounter (Signed)
Message copied by Chrystie Nose on Wed Dec 29, 2012  9:52 AM ------      Message from: Melvia Heaps D      Created: Tue Dec 28, 2012  1:18 PM       Results noted. Please schedule sigmoidoscopy       ----- Message -----         From: Lily Lovings, RN         Sent: 12/28/2012   9:59 AM           To: Louis Meckel, MD            The result is in epic, it is included in the panel.      ----- Message -----         From: Louis Meckel, MD         Sent: 12/27/2012   8:40 AM           To: Lily Lovings, RN            Linda,      No stool for C. dificile toxin report yet.  Was it sent?             ------

## 2013-01-04 ENCOUNTER — Ambulatory Visit (AMBULATORY_SURGERY_CENTER): Payer: Medicare Other

## 2013-01-04 VITALS — Ht 63.0 in | Wt 167.2 lb

## 2013-01-04 DIAGNOSIS — R198 Other specified symptoms and signs involving the digestive system and abdomen: Secondary | ICD-10-CM

## 2013-01-04 DIAGNOSIS — R194 Change in bowel habit: Secondary | ICD-10-CM

## 2013-01-04 DIAGNOSIS — R109 Unspecified abdominal pain: Secondary | ICD-10-CM

## 2013-01-04 DIAGNOSIS — R197 Diarrhea, unspecified: Secondary | ICD-10-CM

## 2013-01-06 ENCOUNTER — Ambulatory Visit (AMBULATORY_SURGERY_CENTER): Payer: Medicare Other | Admitting: Gastroenterology

## 2013-01-06 ENCOUNTER — Encounter: Payer: Self-pay | Admitting: Gastroenterology

## 2013-01-06 VITALS — BP 109/70 | HR 65 | Temp 97.2°F | Resp 14 | Ht 63.0 in | Wt 167.0 lb

## 2013-01-06 DIAGNOSIS — K5730 Diverticulosis of large intestine without perforation or abscess without bleeding: Secondary | ICD-10-CM | POA: Diagnosis not present

## 2013-01-06 DIAGNOSIS — R197 Diarrhea, unspecified: Secondary | ICD-10-CM

## 2013-01-06 MED ORDER — SODIUM CHLORIDE 0.9 % IV SOLN: 500.0000 mL | INTRAVENOUS | Status: DC

## 2013-01-06 NOTE — Patient Instructions (Addendum)

## 2013-01-06 NOTE — Progress Notes (Signed)
Patient did not experience any of the following events: a burn prior to discharge; a fall within the facility; wrong site/side/patient/procedure/implant event; or a hospital transfer or hospital admission upon discharge from the facility. (G8907) Patient did not have preoperative order for IV antibiotic SSI prophylaxis. (G8918)  

## 2013-01-06 NOTE — Op Note (Signed)
[facility] [address] [place], [address]   FLEXIBLE SIGMOIDOSCOPY PROCEDURE REPORT  PATIENT: Brenda [name], Brenda [name]  MR#: [id number] BIRTHDATE: 09/03/1945 , 66  yrs. old GENDER: Female ENDOSCOPIST: Louis Meckel, MD REFERRED BY: PROCEDURE DATE:  01/06/2013 PROCEDURE:   Sigmoidoscopy, diagnostic ASA CLASS:   Class II INDICATIONS:change in bowel habits. MEDICATIONS: MAC sedation, administered by CRNA and propofol (Diprivan) 150mg  IV  DESCRIPTION OF PROCEDURE:   After the risks benefits and alternatives of the procedure were thoroughly explained, informed consent was obtained.  revealed no abnormalities of the rectum. The endoscope was introduced through the anus  and advanced to the descending colon , limited by No adverse events experienced.   The quality of the prep was    .  The instrument was then slowly withdrawn as the mucosa was fully examined.       1.  COLON FINDINGS: Moderate diverticulosis was noted in the sigmoid colon and descending colon. 2.  The colon mucosa was otherwise normal. Retroflexed views revealed no abnormalities.    The scope was then withdrawn from the patient and the procedure terminated.  COMPLICATIONS: There were no complications.  ENDOSCOPIC IMPRESSION: 1.   Moderate diverticulosis was noted in the sigmoid colon and descending colon 2.   The colon mucosa was otherwise normal  RECOMMENDATIONS: 1.  fiber rich diet 2.  follow-up: office PRN   REPEAT EXAM:   _______________________________ eSignedLouis Meckel, MD 01/06/2013 9:27 AM   WN:UUVOZ Romilda Garret, MD

## 2013-01-07 ENCOUNTER — Telehealth: Payer: Self-pay | Admitting: *Deleted

## 2013-01-07 NOTE — Telephone Encounter (Signed)
  Follow up Call-  Call back number 01/06/2013  Post procedure Call Back phone  # [phone]  Permission to leave phone message Yes     Patient questions:  Do you have a fever, pain , or abdominal swelling? no Pain Score  0 *  Have you tolerated food without any problems? yes  Have you been able to return to your normal activities? yes  Do you have any questions about your discharge instructions: Diet   no Medications  no Follow up visit  no  Do you have questions or concerns about your Care? no  Actions: * If pain score is 4 or above: No action needed, pain <4.

## 2013-01-10 ENCOUNTER — Other Ambulatory Visit: Payer: Medicare Other | Admitting: Gastroenterology

## 2013-01-13 DIAGNOSIS — H2510 Age-related nuclear cataract, unspecified eye: Secondary | ICD-10-CM | POA: Diagnosis not present

## 2013-01-13 DIAGNOSIS — H04129 Dry eye syndrome of unspecified lacrimal gland: Secondary | ICD-10-CM | POA: Diagnosis not present

## 2013-05-12 DIAGNOSIS — H04129 Dry eye syndrome of unspecified lacrimal gland: Secondary | ICD-10-CM | POA: Diagnosis not present

## 2013-05-26 DIAGNOSIS — Z23 Encounter for immunization: Secondary | ICD-10-CM | POA: Diagnosis not present

## 2013-06-04 ENCOUNTER — Other Ambulatory Visit: Payer: Self-pay | Admitting: Nurse Practitioner

## 2013-06-06 NOTE — Telephone Encounter (Signed)
AEX 06/09/13 with Ms.Patty (Looking in chart patient usually gets 3 months supply at a time) #24/0rfs sent through to pharmacy.

## 2013-06-07 DIAGNOSIS — C649 Malignant neoplasm of unspecified kidney, except renal pelvis: Secondary | ICD-10-CM | POA: Diagnosis not present

## 2013-06-08 DIAGNOSIS — C649 Malignant neoplasm of unspecified kidney, except renal pelvis: Secondary | ICD-10-CM | POA: Diagnosis not present

## 2013-06-08 DIAGNOSIS — K7460 Unspecified cirrhosis of liver: Secondary | ICD-10-CM | POA: Diagnosis not present

## 2013-06-09 ENCOUNTER — Ambulatory Visit (INDEPENDENT_AMBULATORY_CARE_PROVIDER_SITE_OTHER): Payer: Medicare Other | Admitting: Nurse Practitioner

## 2013-06-09 ENCOUNTER — Encounter: Payer: Self-pay | Admitting: Nurse Practitioner

## 2013-06-09 VITALS — BP 130/70 | HR 68 | Resp 16 | Ht 63.0 in | Wt 164.0 lb

## 2013-06-09 DIAGNOSIS — Z01419 Encounter for gynecological examination (general) (routine) without abnormal findings: Secondary | ICD-10-CM

## 2013-06-09 DIAGNOSIS — E559 Vitamin D deficiency, unspecified: Secondary | ICD-10-CM | POA: Diagnosis not present

## 2013-06-09 MED ORDER — ESTRADIOL 0.025 MG/24HR TD PTTW: 1.0000 | MEDICATED_PATCH | TRANSDERMAL | Status: DC

## 2013-06-09 NOTE — Patient Instructions (Signed)

## 2013-06-09 NOTE — Progress Notes (Signed)
Patient ID: Brenda [name], female   DOB: 1945/11/06, 67 y.o.   MRN: [id number] 67 y.o. G0P0 Married Caucasian Fe here for annual exam.  Mother is now going to be 74 yrs old in 2 weeks.  Has sitters for her.  She is doing well and wants to continue on ERT.  she still has vaso symptoms, but is willing to try a lower dose of ERT.  She just had a follow up MRI done of abdomen to recheck on her renal cancer several years ago.  Patient's last menstrual period was 08/25/1992.          Sexually active: yes  The current method of family planning is status post hysterectomy.    Exercising: no  The patient does not participate in regular exercise at present. Smoker:  No, former smoker  Health Maintenance: Pap: 05/18/08, WNL MMG: 02/20/12, Bi-Rads 1 negative Colonoscopy: 11/25/04, normal, repeat 10 years, Flex Sig 01/06/13 BMD: 11/22/09, normal TDaP: 08/21/11 Labs: PCP   reports that she quit smoking about 28 years ago. Her smoking use included Cigarettes. She smoked 0.00 packs per day. She has never used smokeless tobacco. She reports that she does not drink alcohol or use illicit drugs.  Past Medical History  Diagnosis Date  . Arthritis   . GERD (gastroesophageal reflux disease)   . Hyperlipidemia   . Hiatal hernia   . Bursitis   . Diverticulitis 11/2004  . Diarrhea   . UTI (urinary tract infection)   . Endometriosis 1994  . Cancer 04/16/09    kidney cancer    Past Surgical History  Procedure Laterality Date  . Oophorectomy    . Tonsillectomy    . Nephrectomy  04/16/09    Partial removal due to cancer  . Lung biopsy  1996    Negative  . Abdominal hysterectomy  1994    TAH/BSO due to endometriosis    Current Outpatient Prescriptions  Medication Sig Dispense Refill  . aspirin 81 MG tablet Take 81 mg by mouth daily.        . calcium-vitamin D (OSCAL WITH D 500-200) 500-200 MG-UNIT per tablet Take 1 tablet by mouth daily.        . ergocalciferol (VITAMIN D2) 50000 UNITS capsule Take 50,000  Units by mouth every 30 (thirty) days.        Marland Kitchen estradiol (MINIVELLE) 0.05 MG/24HR patch Place 1 patch onto the skin 2 (two) times a week.      . Multiple Vitamin (MULTIVITAMIN) tablet Take 1 tablet by mouth daily.        Marland Kitchen omeprazole (PRILOSEC) 40 MG capsule Take 40 mg by mouth 2 (two) times daily.       Marland Kitchen PATADAY 0.2 % SOLN Place 1 drop into both eyes as needed.      . RESTASIS 0.05 % ophthalmic emulsion Place 1 drop into both eyes 2 (two) times daily.      Marland Kitchen trimethoprim (TRIMPEX) 100 MG tablet Take 1 tablet by mouth once. After intercourse      . VAGIFEM 10 MCG TABS vaginal tablet USE TWICE WEEKLY AS DIRECTED  24 tablet  0  . estradiol (VIVELLE-DOT) 0.025 MG/24HR Place 1 patch onto the skin 2 (two) times a week.  24 patch  3   No current facility-administered medications for this visit.    Family History  Problem Relation Age of Onset  . Cancer Father   . Colon cancer Neg Hx   . Thyroid cancer Mother  ROS:  Pertinent items are noted in HPI.  Otherwise, a comprehensive ROS was negative.  Exam:   BP 130/70  Pulse 68  Resp 16  Ht 5\' 3"  (1.6 m)  Wt 164 lb (74.39 kg)  BMI 29.06 kg/m2  LMP 08/25/1992 Height: 5\' 3"  (160 cm)  Ht Readings from Last 3 Encounters:  06/09/13 5\' 3"  (1.6 m)  01/06/13 5\' 3"  (1.6 m)  01/04/13 5\' 3"  (1.6 m)    General appearance: alert, cooperative and appears stated age Head: Normocephalic, without obvious abnormality, atraumatic Neck: no adenopathy, supple, symmetrical, trachea midline and thyroid normal to inspection and palpation Lungs: clear to auscultation bilaterally Breasts: normal appearance, no masses or tenderness Heart: regular rate and rhythm Abdomen: soft, non-tender; no masses,  no organomegaly Extremities: extremities normal, atraumatic, no cyanosis or edema Skin: Skin color, texture, turgor normal. No rashes or lesions Lymph nodes: Cervical, supraclavicular, and axillary nodes normal. No abnormal inguinal nodes  palpated Neurologic: Grossly normal   Pelvic: External genitalia:  no lesions or erythema today              Urethra:  normal appearing urethra with no masses, tenderness or lesions              Bartholin's and Skene's: normal                 Vagina: normal appearing vagina with normal color and discharge, no lesions              Cervix: absent              Pap taken: no Bimanual Exam:  Uterus:  uterus absent              Adnexa: no mass, fullness, tenderness               Rectovaginal: Confirms               Anus:  normal sphincter tone, no lesions  A:  Well Woman with normal exam  S/P TAH/ BSO 1994 on HRT  S/P right partial nephrectomy secondary to cancer 03/2009  Chronic vulvitis  Atrophic vaginitis - better on Vagifem  Post coital UTI  Vit D deficiency  P:   Pap smear as per guidelines Not indicated  Mammogram due this year - but wants to wait till later since just had MRI done of abdomen and pelvis.  Does not need a refill on ERT at this time - but still trying to decrease her dose of Minivelle.  Will try Vivelle dot 0.025 mg and alternate with dose of Minivelle she already has on hand and then go on down to the lower dose as tolerated.  Does not need a refill on Vagifem or Vit D at this time will call when needed.  Discussed risk of ERT including CVA, DVT, cancer, etc  Recheck Vit D today and follow  Refill Triamcinolone 0.1 % and Silvadene cream 3:1 compound use bid prn. 60 gm for a year.  Counseled on breast self exam, use and side effects of HRT, adequate intake of calcium and vitamin D, diet and exercise return annually or prn  An After Visit Summary was printed and given to the patient.

## 2013-06-10 LAB — VITAMIN D 25 HYDROXY (VIT D DEFICIENCY, FRACTURES): Vit D, 25-Hydroxy: 51 ng/mL (ref 30–89)

## 2013-06-10 MED ORDER — NONFORMULARY OR COMPOUNDED ITEM: Status: DC

## 2013-06-14 NOTE — Progress Notes (Signed)
Encounter reviewed by Dr. Brook Silva.  

## 2013-06-17 DIAGNOSIS — N393 Stress incontinence (female) (male): Secondary | ICD-10-CM | POA: Diagnosis not present

## 2013-06-17 DIAGNOSIS — C649 Malignant neoplasm of unspecified kidney, except renal pelvis: Secondary | ICD-10-CM | POA: Diagnosis not present

## 2013-06-29 ENCOUNTER — Encounter: Payer: Self-pay | Admitting: Gastroenterology

## 2013-06-29 NOTE — Progress Notes (Signed)
Patient ID: Brenda [name], female   DOB: 1946-08-18, 67 y.o.   MRN: [id number] I received a copy of an office note from urology.  In his note there is a CT scan from June 08, 2013 that demonstrates a nodular liver consistent with cirrhosis.  We'll schedule patient for an office visit

## 2013-07-12 ENCOUNTER — Telehealth: Payer: Self-pay | Admitting: *Deleted

## 2013-07-12 NOTE — Telephone Encounter (Signed)
L/m for pt to contact the office to schedule an appointment

## 2013-07-12 NOTE — Telephone Encounter (Signed)
Message copied by Marlowe Kays on Tue Jul 12, 2013  3:01 PM ------      Message from: Melvia Heaps D      Created: Wed Jun 29, 2013  9:16 AM       Patient needs an office visit ------

## 2013-08-31 ENCOUNTER — Other Ambulatory Visit: Payer: Self-pay | Admitting: Nurse Practitioner

## 2013-09-01 NOTE — Telephone Encounter (Signed)
eScribe request for refill on VAGIFEM Last filled - 06/06/13 X 3 MONTH SUPPLY Last AEX - 06/09/13 Next AEX - 06/15/13 OK to fill until next AEX per last AEX note.  RX sent.

## 2013-11-04 ENCOUNTER — Other Ambulatory Visit: Payer: Self-pay | Admitting: *Deleted

## 2013-11-04 NOTE — Telephone Encounter (Signed)
eScribe request from [facility] for refill on Minivelle Last AEX - 06/09/13 Next AEX - 06/15/14  Pt was not given a refill at last AEX as she had medication remaining.  Last AEX note states she may decrease to lower dose when refill needed.  Please advise refills.  Thanks.

## 2013-11-06 NOTE — Telephone Encounter (Signed)
Will need to clarify with Patty she was to decrease dose to .25 mg Vivelle dot

## 2013-11-08 NOTE — Telephone Encounter (Signed)
She needs to be on Vivelle dot 0.025 mg twice weekly now.  She was on Minivelle 0.5 and alternating with Vivelle to gradually lower her dose.  Should be down to Vivelle 0.025 now. Ok to refill if she needs one.

## 2013-11-16 ENCOUNTER — Telehealth: Payer: Self-pay | Admitting: Nurse Practitioner

## 2013-11-16 NOTE — Telephone Encounter (Signed)
Patient said Brenda [name] told her to call around this time to talk with her about her estradiol patch. Offered pt appt next tuesday but decined Said she didn't not have to come in for appt that hey could talk about it over the phone.

## 2013-11-17 NOTE — Telephone Encounter (Signed)
Message copied by Jasmine Awe on Thu Nov 17, 2013  3:24 PM ------      Message from: Regina Eck      Created: Thu Nov 17, 2013  2:08 PM       Please let patient know Chong Sicilian out of town and will talk with her when returns ------

## 2013-11-17 NOTE — Telephone Encounter (Signed)
Left message to call Kaitlyn at [phone]. 

## 2013-11-18 MED ORDER — ESTRADIOL 0.05 MG/24HR TD PTTW: 1.0000 | MEDICATED_PATCH | TRANSDERMAL | Status: DC

## 2013-11-18 NOTE — Telephone Encounter (Signed)
Spoke with patient. Patient ran out of refills for estradiol 0.5mg . States she was supposed to call in to speak with Patty about continuing with current treatment of switching back and forth between estradiol 0.5 mg and 0.025 mg. Advised Patty out on vacation until next week. Patient requesting refill for estradiol 0.5 mg until she can speak with Patty next week. Order placed for one month supply with no refills. Advised would route to [facility] for phone call when she returned. Patient verbalizes understanding and is agreeable.  Routing to provider for final review. Patient agreeable to disposition. Will close encounter

## 2013-11-22 ENCOUNTER — Telehealth: Payer: Self-pay | Admitting: Nurse Practitioner

## 2013-11-22 DIAGNOSIS — Z79890 Hormone replacement therapy (postmenopausal): Secondary | ICD-10-CM

## 2013-11-22 MED ORDER — ESTRADIOL 0.05 MG/24HR TD PTTW: 1.0000 | MEDICATED_PATCH | TRANSDERMAL | Status: DC

## 2013-11-22 NOTE — Telephone Encounter (Signed)
Called patient back and she has tried the lower dose of Vivelle 0.025 and alternating with Minivelle 0.05 at every other dose.  Since she was last here and is having bad leg cramps.  She states this happened the last time she tried to reduce and had to go back on the higher dose.  She is advised to return to [place] 0.05 twice weekly and retry for 2 weeks. She then will give me a call and will try to see if that has helped.  If not may run another CMP here ar at PCP.  Last CMP in 10/13 by PCP was normal.

## 2013-12-22 ENCOUNTER — Telehealth: Payer: Self-pay | Admitting: Nurse Practitioner

## 2013-12-22 NOTE — Telephone Encounter (Signed)
Patient calling to give update on .05 patch for Estradiol and reports her leg cramps are getting better. Patient requests a new RX for .05 patch. She uses a .25 patch the first part of the week and .05 second part of the week per Patty.  [id number]

## 2013-12-22 NOTE — Telephone Encounter (Signed)
Reviewed Minivelle 0.05mg  prescription for reorder. [facility] still has [facility]. Spoke with patient. [facility] would need a refill next time she came in for [place] 0.05mg . Advised would call [facility] and check that refills are available and give patient a call back. Patient agreeable. Per [facility] tech [facility] there are 6 refills left on prescription. Spoke with patient advised 6 refills still remain. Patient agreeable. Will call back with any further needs or questions.  Routing to provider for final review. Patient agreeable to disposition. Will close encounter.

## 2013-12-22 NOTE — Telephone Encounter (Signed)
Spoke with patient. Patient states that she has been alternating between the [place] 0.05mg  and the Vivelle dot 0.025mg  as Brenda Cage, FNP recommended the last time they spoke. Patient states she was having leg cramps when she tried to decrease to Vivelle dot 0.025mg  all the time which is why she started alternating prescriptions again. Would like to continue alternating prescriptions stating "I am doing much better. I am not having leg cramps anymore and I am sleeping better at night." Advised would send message to Brenda Cage, FNP to let her know and will send in prescriptions [facility] Minivelle 0.[facility]mg  [facility]. Patient agreeable and verbalizes understanding.

## 2013-12-30 ENCOUNTER — Other Ambulatory Visit: Payer: Self-pay | Admitting: *Deleted

## 2013-12-30 MED ORDER — TRIMETHOPRIM 100 MG PO TABS: 100.0000 mg | ORAL_TABLET | Freq: Once | ORAL | Status: DC

## 2013-12-30 NOTE — Telephone Encounter (Signed)
Last AEX 06/09/13 Last refill 06/03/2012  Next appt 06/15/14  Will refill until appt.

## 2014-01-18 DIAGNOSIS — L820 Inflamed seborrheic keratosis: Secondary | ICD-10-CM | POA: Diagnosis not present

## 2014-01-18 DIAGNOSIS — D235 Other benign neoplasm of skin of trunk: Secondary | ICD-10-CM | POA: Diagnosis not present

## 2014-01-18 DIAGNOSIS — L723 Sebaceous cyst: Secondary | ICD-10-CM | POA: Diagnosis not present

## 2014-03-02 DIAGNOSIS — H04129 Dry eye syndrome of unspecified lacrimal gland: Secondary | ICD-10-CM | POA: Diagnosis not present

## 2014-03-02 DIAGNOSIS — H2510 Age-related nuclear cataract, unspecified eye: Secondary | ICD-10-CM | POA: Diagnosis not present

## 2014-06-07 DIAGNOSIS — Z23 Encounter for immunization: Secondary | ICD-10-CM | POA: Diagnosis not present

## 2014-06-13 DIAGNOSIS — Z23 Encounter for immunization: Secondary | ICD-10-CM | POA: Diagnosis not present

## 2014-06-15 ENCOUNTER — Encounter: Payer: Self-pay | Admitting: Nurse Practitioner

## 2014-06-15 ENCOUNTER — Ambulatory Visit (INDEPENDENT_AMBULATORY_CARE_PROVIDER_SITE_OTHER): Payer: Medicare Other | Admitting: Nurse Practitioner

## 2014-06-15 VITALS — BP 130/84 | HR 76 | Ht 63.25 in | Wt 157.0 lb

## 2014-06-15 DIAGNOSIS — Z01419 Encounter for gynecological examination (general) (routine) without abnormal findings: Secondary | ICD-10-CM

## 2014-06-15 DIAGNOSIS — N952 Postmenopausal atrophic vaginitis: Secondary | ICD-10-CM | POA: Diagnosis not present

## 2014-06-15 DIAGNOSIS — Z124 Encounter for screening for malignant neoplasm of cervix: Secondary | ICD-10-CM

## 2014-06-15 DIAGNOSIS — Z79890 Hormone replacement therapy (postmenopausal): Secondary | ICD-10-CM | POA: Diagnosis not present

## 2014-06-15 MED ORDER — TRIMETHOPRIM 100 MG PO TABS: 100.0000 mg | ORAL_TABLET | Freq: Once | ORAL | Status: DC

## 2014-06-15 MED ORDER — ESTRADIOL 10 MCG VA TABS: ORAL_TABLET | VAGINAL | Status: DC

## 2014-06-15 MED ORDER — ESTRADIOL 0.025 MG/24HR TD PTTW: 1.0000 | MEDICATED_PATCH | TRANSDERMAL | Status: DC

## 2014-06-15 MED ORDER — NONFORMULARY OR COMPOUNDED ITEM: Status: DC

## 2014-06-15 NOTE — Patient Instructions (Signed)

## 2014-06-15 NOTE — Progress Notes (Signed)
Patient ID: Brenda [name], female   DOB: 06-30-1946, 68 y.o.   MRN: [id number] 68 y.o. G0P0 Married Caucasian Fe here for annual exam.  Now has tapered down on HRT about 3 months ago to the 0.025mg .  No increase in vaso symptoms or mood.  She is willing to try and taper more this year  Patient's last menstrual period was 08/25/1992.          Sexually active: yes  The current method of family planning is status post hysterectomy.  Exercising: no The patient does not participate in regular exercise at present.  Smoker: No, former smoker   Health Maintenance:  Pap: 05/18/08, WNL  MMG: 02/20/12, Bi-Rads 1 negative will schedule Colonoscopy: 11/25/04, normal, repeat 10 years, Flex Sig 01/06/13  BMD: 11/22/09, normal  TDaP: 08/21/11  Labs:  PCP   reports that she quit smoking about 29 years ago. Her smoking use included Cigarettes. She smoked 0.00 packs per day. She has never used smokeless tobacco. She reports that she does not drink alcohol or use illicit drugs.  Past Medical History  Diagnosis Date  . Arthritis   . GERD (gastroesophageal reflux disease)   . Hyperlipidemia   . Hiatal hernia   . Bursitis   . Diverticulitis 11/2004  . Diarrhea   . UTI (urinary tract infection)   . Endometriosis 1994  . Cancer 04/16/09    kidney cancer    Past Surgical History  Procedure Laterality Date  . Oophorectomy    . Tonsillectomy    . Nephrectomy  04/16/09    Partial removal due to cancer  . Lung biopsy  1996    Negative  . Abdominal hysterectomy  1994    TAH/BSO due to endometriosis    Current Outpatient Prescriptions  Medication Sig Dispense Refill  . aspirin 81 MG tablet Take 81 mg by mouth daily.        . calcium-vitamin D (OSCAL WITH D 500-200) 500-200 MG-UNIT per tablet Take 1 tablet by mouth daily.        . ergocalciferol (VITAMIN D2) 50000 UNITS capsule Take 50,000 Units by mouth every 30 (thirty) days.        . Estradiol (VAGIFEM) 10 MCG TABS vaginal tablet USE TWICE WEEKLY AS  DIRECTED  24 tablet  3  . estradiol (VIVELLE-DOT) 0.025 MG/24HR Place 1 patch onto the skin 2 (two) times a week.  8 patch  0  . Multiple Vitamin (MULTIVITAMIN) tablet Take 1 tablet by mouth daily.        . NONFORMULARY OR COMPOUNDED ITEM Triamcinolone 0.1 % and Silvadene Cream 3:1 ratio, use BID 60 gm  1 each  3  . omeprazole (PRILOSEC) 40 MG capsule Take 40 mg by mouth 2 (two) times daily.       Marland Kitchen PATADAY 0.2 % SOLN Place 1 drop into both eyes as needed.      . RESTASIS 0.05 % ophthalmic emulsion Place 1 drop into both eyes 2 (two) times daily.      Marland Kitchen trimethoprim (TRIMPEX) 100 MG tablet Take 1 tablet (100 mg total) by mouth once. After intercourse  30 tablet  2   No current facility-administered medications for this visit.    Family History  Problem Relation Age of Onset  . Cancer Father   . Colon cancer Neg Hx   . Thyroid cancer Mother     ROS:  Pertinent items are noted in HPI.  Otherwise, a comprehensive ROS was negative.  Exam:  BP 130/84  Pulse 76  Ht 5' 3.25" (1.607 m)  Wt 157 lb (71.215 kg)  BMI 27.58 kg/m2  LMP 08/25/1992 Height: 5' 3.25" (160.7 cm)  Ht Readings from Last 3 Encounters:  06/15/14 5' 3.25" (1.607 m)  06/09/13 5\' 3"  (1.6 m)  01/06/13 5\' 3"  (1.6 m)    General appearance: alert, cooperative and appears stated age Head: Normocephalic, without obvious abnormality, atraumatic Neck: no adenopathy, supple, symmetrical, trachea midline and thyroid normal to inspection and palpation Lungs: clear to auscultation bilaterally Breasts: normal appearance, no masses or tenderness Heart: regular rate and rhythm Abdomen: soft, non-tender; no masses,  no organomegaly Extremities: extremities normal, atraumatic, no cyanosis or edema Skin: Skin color, texture, turgor normal. No rashes or lesions - crusty raised area on right lower leg that looks suspicious Lymph nodes: Cervical, supraclavicular, and axillary nodes normal. No abnormal inguinal nodes  palpated Neurologic: Grossly normal   Pelvic: External genitalia:  no lesions              Urethra:  normal appearing urethra with no masses, tenderness or lesions              Bartholin's and Skene's: normal                 Vagina: normal appearing vagina with normal color and discharge, no lesions              Cervix: absent              Pap taken: No. Bimanual Exam:  Uterus:  uterus absent              Adnexa: no mass, fullness, tenderness               Rectovaginal: Confirms               Anus:  normal sphincter tone, no lesions  A:  Well Woman with normal exam  S/P TAH/ BSO 1994 on HRT since  S/P right partial nephrectomy secondary to cancer 03/2009   Chronic vulvitis   Atrophic vaginitis - better on Vagifem   Post coital UTI   Vit D deficiency  P:   Reviewed health and wellness pertinent to exam  Pap smear not taken today  Mammogram is due now - and she will schedule  She will call and get a derm apt for raised abnormal area right lower leg.  Refill on Vivelle dot 0.025 mg twice weekly and Vagifem for a year.  She will continue to try a taper  Counseled with risk of DVT, CVA, cancer, etc.  Refill on Trimex to use prn PC.  Refill on Triamcinolone and Silvadene prn for vulvitis  Counseled on breast self exam, mammography screening, use and side effects of HRT, adequate intake of calcium and vitamin D, diet and exercise, Kegel's exercises return annually or prn  An After Visit Summary was printed and given to the patient.

## 2014-06-16 ENCOUNTER — Other Ambulatory Visit: Payer: Self-pay

## 2014-06-16 DIAGNOSIS — Z1231 Encounter for screening mammogram for malignant neoplasm of breast: Secondary | ICD-10-CM

## 2014-06-16 DIAGNOSIS — Z1239 Encounter for other screening for malignant neoplasm of breast: Secondary | ICD-10-CM

## 2014-06-18 NOTE — Progress Notes (Signed)
Encounter reviewed by Dr. Josefa Half. Has appointment for mammogram on 06/19/14. I would consider re-evaluation of vulvitis.

## 2014-06-19 ENCOUNTER — Ambulatory Visit
Admission: RE | Admit: 2014-06-19 | Discharge: 2014-06-19 | Disposition: A | Discharge status: Home / Self Care | Payer: Medicare Other | Source: Ambulatory Visit

## 2014-06-19 DIAGNOSIS — Z1231 Encounter for screening mammogram for malignant neoplasm of breast: Secondary | ICD-10-CM | POA: Diagnosis not present

## 2014-06-22 ENCOUNTER — Telehealth: Payer: Self-pay | Admitting: Family Medicine

## 2014-06-22 NOTE — Telephone Encounter (Signed)
Lm on vm for pt to call back. Did not inform her of decision.

## 2014-06-22 NOTE — Telephone Encounter (Signed)
Sorry but I am not accepting new patients at this time

## 2014-06-22 NOTE — Telephone Encounter (Signed)
Pt states she called and was told she could be your pt, and you were accepting pts,  but there is no record of it and and you have not accepting pts in a long time. There is no record of this. Will you accept pt?

## 2014-06-23 NOTE — Telephone Encounter (Signed)
Pt called back and I advised her of Dr Sarajane Jews decision. She said she would call back

## 2014-06-28 ENCOUNTER — Other Ambulatory Visit: Payer: Self-pay | Admitting: Nurse Practitioner

## 2014-06-28 NOTE — Telephone Encounter (Signed)
Last refilled/AEX: 06/15/14 #8/0rfs by Kem Boroughs -According to AEX note patient was due for mammogram Mammogram was done: 06/19/14 Bi-Rads 1: Negative AEX Scheduled: 06/22/15 with Ms. Gayla Doss to send for the remainder of the year?  (Rx sent to Ms. Debbie given Ms. Chong Sicilian is out of office today)

## 2014-07-03 DIAGNOSIS — C44722 Squamous cell carcinoma of skin of right lower limb, including hip: Secondary | ICD-10-CM | POA: Diagnosis not present

## 2014-07-04 ENCOUNTER — Telehealth: Payer: Self-pay | Admitting: Nurse Practitioner

## 2014-07-04 NOTE — Telephone Encounter (Signed)
Patient was called at request of [name] to further investigate her symptoms of 'chronic vulvitis' for which she was diagnosed in 2006.  She has used Triamcinolone and silvadene for years and has helped.  It was explained to patient that we wanted  to R/O LSA.  We then would have an official diagnosis and that treatment was a little different.  Currently she is having no flare but will consider having this done at next flare.  She just went to dermatologist and had area on leg removed that turned out to be a skin cancer.  Feels she does not want to do anything right now.

## 2014-07-04 NOTE — Telephone Encounter (Signed)
I have received your note. It is fine for the patient not to have the biopsy at this time.  If symptoms recur, we will have her come back then for the biopsy.

## 2014-08-07 DIAGNOSIS — Z85828 Personal history of other malignant neoplasm of skin: Secondary | ICD-10-CM | POA: Diagnosis not present

## 2014-08-07 DIAGNOSIS — Z08 Encounter for follow-up examination after completed treatment for malignant neoplasm: Secondary | ICD-10-CM | POA: Diagnosis not present

## 2014-11-08 ENCOUNTER — Encounter: Payer: Self-pay | Admitting: Gastroenterology

## 2014-11-21 ENCOUNTER — Encounter: Payer: Self-pay | Admitting: Internal Medicine

## 2014-11-21 ENCOUNTER — Ambulatory Visit (INDEPENDENT_AMBULATORY_CARE_PROVIDER_SITE_OTHER): Payer: Medicare Other | Admitting: Internal Medicine

## 2014-11-21 VITALS — BP 138/100 | HR 75 | Temp 97.7°F | Wt 163.0 lb

## 2014-11-21 DIAGNOSIS — J0100 Acute maxillary sinusitis, unspecified: Secondary | ICD-10-CM

## 2014-11-21 DIAGNOSIS — G4489 Other headache syndrome: Secondary | ICD-10-CM | POA: Diagnosis not present

## 2014-11-21 MED ORDER — CEFUROXIME AXETIL 500 MG PO TABS: 500.0000 mg | ORAL_TABLET | Freq: Two times a day (BID) | ORAL | Status: DC

## 2014-11-21 MED ORDER — IBUPROFEN 600 MG PO TABS: 600.0000 mg | ORAL_TABLET | Freq: Three times a day (TID) | ORAL | Status: DC | PRN

## 2014-11-21 NOTE — Progress Notes (Signed)
Pre visit review using our clinic review tool, if applicable. No additional management support is needed unless otherwise documented below in the visit note. 

## 2014-11-21 NOTE — Assessment & Plan Note (Signed)
3/16 due to sinusitis

## 2014-11-21 NOTE — Progress Notes (Signed)
   Subjective:    Patient ID: EDWINA GROSSBERG, female    DOB: 1946-03-08, 69 y.o.   MRN: [id number]  HPI  C/o HA x 3 wks C/o sinus d/c x 3 wks  Review of Systems  Constitutional: Positive for fatigue. Negative for chills, activity change, appetite change and unexpected weight change.  HENT: Positive for rhinorrhea and sinus pressure. Negative for congestion, facial swelling, mouth sores and sore throat.   Eyes: Negative for visual disturbance.  Respiratory: Negative for cough and chest tightness.   Gastrointestinal: Negative for nausea and abdominal pain.  Genitourinary: Negative for frequency, difficulty urinating and vaginal pain.  Musculoskeletal: Negative for back pain and gait problem.  Skin: Negative for pallor and rash.  Neurological: Positive for headaches. Negative for dizziness, tremors, weakness and numbness.  Psychiatric/Behavioral: Negative for confusion and sleep disturbance.       Objective:   Physical Exam  Constitutional: She appears well-developed. No distress.  HENT:  Head: Normocephalic.  Right Ear: External ear normal.  Left Ear: External ear normal.  Nose: Nose normal.  Mouth/Throat: Oropharynx is clear and moist.  Eyes: Conjunctivae are normal. Pupils are equal, round, and reactive to light. Right eye exhibits no discharge. Left eye exhibits no discharge.  Neck: Normal range of motion. Neck supple. No JVD present. No tracheal deviation present. No thyromegaly present.  Cardiovascular: Normal rate, regular rhythm and normal heart sounds.   Pulmonary/Chest: No stridor. No respiratory distress. She has no wheezes.  Abdominal: Soft. Bowel sounds are normal. She exhibits no distension and no mass. There is no tenderness. There is no rebound and no guarding.  Musculoskeletal: She exhibits no edema or tenderness.  Lymphadenopathy:    She has no cervical adenopathy.  Neurological: She displays normal reflexes. No cranial nerve deficit. She exhibits normal muscle  tone. Coordination normal.  Skin: No rash noted. No erythema.  Psychiatric: She has a normal mood and affect. Her behavior is normal. Judgment and thought content normal.          Assessment & Plan:

## 2014-11-21 NOTE — Assessment & Plan Note (Addendum)
Ceftin x 10 d Probiotic

## 2014-12-06 ENCOUNTER — Telehealth: Payer: Self-pay | Admitting: Nurse Practitioner

## 2014-12-06 ENCOUNTER — Telehealth: Payer: Self-pay | Admitting: Internal Medicine

## 2014-12-06 MED ORDER — FLUCONAZOLE 150 MG PO TABS: 150.0000 mg | ORAL_TABLET | Freq: Once | ORAL | Status: DC

## 2014-12-06 NOTE — Telephone Encounter (Signed)
ok Diflucan - emailed Thx

## 2014-12-06 NOTE — Telephone Encounter (Signed)
Call to patient, states she thinks she has a yeast infection due to antibiotics. Requests medication previously given by Edman Circle FNP in 2014. Complains of vaginal pain and burning. Denies discharge or itching.Advised recommend she come in for office visit for evaluation and appointments offered today, tomorrow and Friday. Patient scheduled for 12-08-14 at 1pm , patient declined earlier appointment. Last AEX 06-15-14 with Edman Circle. Routing to Dr Quincy Simmonds while Chong Sicilian out of office.  Routing to provider for final review. Patient agreeable to disposition. Will close encounter.

## 2014-12-06 NOTE — Telephone Encounter (Signed)
I would suggest over the counter Monistat or Gyne-Lotrimin for treatment of potential yeast  This can give very quick resolution of symptoms.  I would be very happy to see the patient for an appointment today if she would like a prescription treatment for vaginitis.

## 2014-12-06 NOTE — Telephone Encounter (Signed)
Call back to patient. Again offered office earlier office visit but patient declines due to her own schedule restrictions. Discussed over the counter options for treatment. Patient is hesitant to try in case it partially treats the area and then we are not able to identify during office visit. Advised she may try hydrocortisone ointment externally for discomfort until appointment and if decides she would like to come in sooner, she may call back for appointment tomorrow. Previously advised patient that she could contact the MD that treated her with antibiotics to see if they would call in medication for yeast, patient states she has called them but they have not yet answered.  Routing to provider for final review. Patient agreeable to disposition. Will close encounter

## 2014-12-06 NOTE — Telephone Encounter (Signed)
Pt calls stated Dr. Camila Li gave her antibiotic last ov and now she has yeast infection. Pt was wondering if she can get Dr[facility] Camila Li [facility]. Please help

## 2014-12-06 NOTE — Telephone Encounter (Signed)
Pt states shes been on an antibiotic for 10 days and now has a yeast infection. Requests appointment asap. Asks if appointment not available - would we be able to send same rx she used last time she had a yeast infection?

## 2014-12-07 NOTE — Telephone Encounter (Signed)
Left voice mail that rx has been sent in to [facility]

## 2014-12-08 ENCOUNTER — Encounter: Payer: Self-pay | Admitting: Family Medicine

## 2014-12-08 ENCOUNTER — Ambulatory Visit: Payer: Medicare Other | Admitting: Obstetrics and Gynecology

## 2014-12-08 ENCOUNTER — Ambulatory Visit (INDEPENDENT_AMBULATORY_CARE_PROVIDER_SITE_OTHER): Payer: Medicare Other | Admitting: Family Medicine

## 2014-12-08 ENCOUNTER — Telehealth: Payer: Self-pay | Admitting: Obstetrics and Gynecology

## 2014-12-08 VITALS — BP 118/82 | HR 75 | Temp 97.8°F | Ht 63.25 in | Wt 163.6 lb

## 2014-12-08 DIAGNOSIS — K222 Esophageal obstruction: Secondary | ICD-10-CM | POA: Diagnosis not present

## 2014-12-08 DIAGNOSIS — Z7689 Persons encountering health services in other specified circumstances: Secondary | ICD-10-CM

## 2014-12-08 DIAGNOSIS — R42 Dizziness and giddiness: Secondary | ICD-10-CM | POA: Diagnosis not present

## 2014-12-08 DIAGNOSIS — K7460 Unspecified cirrhosis of liver: Secondary | ICD-10-CM | POA: Diagnosis not present

## 2014-12-08 DIAGNOSIS — Z7189 Other specified counseling: Secondary | ICD-10-CM

## 2014-12-08 DIAGNOSIS — K219 Gastro-esophageal reflux disease without esophagitis: Secondary | ICD-10-CM

## 2014-12-08 NOTE — Telephone Encounter (Signed)
Patient canceled her appointment for today for yeast infection. She went to her primary doctor's office yesterday when we could not see her. She was given some medication and now does not need this appointment.

## 2014-12-08 NOTE — Patient Instructions (Addendum)
BEFORE YOU LEAVE: -please schedule medicare wellness visit in 3 month  -We placed a referral for you as discussed to the neurologist per your request and to [facility] gastroenterology per your request. It usually takes about 1-2 weeks to process and schedule this referral. If you have not heard from [place] regarding this appointment in 2 weeks please contact our office.  We recommend the following healthy lifestyle measures: - eat a healthy diet consisting of lots of vegetables, fruits, beans, nuts, seeds, healthy meats such as white chicken and fish and whole grains.  - avoid fried foods, fast food, processed foods, sodas, red meet and other fattening foods.  - get a least 150 minutes of aerobic exercise per week.

## 2014-12-08 NOTE — Progress Notes (Signed)
HPI:  Brenda [name] is here to establish care.  Last PCP and physical: sees Brenda [name] for female exams and HRT  - they are weaning off of these.  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 [name] in the past, but she felt he was overly concernd, then was referred to [facility] and there was an issue with her having medicare -reports eval for her hepatitis at [facility] in 2013 remotely in the past and told has cirrhosis and told not to drink or take tylenol -she also wants a referral to [facility] again for these issues  Hx renal cancer: -reports s/p surgical resection in 2013 and reports told does not need to do anything further -reports she was told needed no further survaillence of follow up for this  Dizziness: -started at age 69 - reports eval in the past and resolved mostly for a long time then recurred for at least the last 3 years -brief episodes of vertigo lasting several days - 3-4 episodes per year for the last few years associated with headaches -symptoms: vertigo with movements, balance feels off, frontal headache and pressure with episodes - symptoms resolve between episodes -denies: HA, changes, persistent symptoms, symptoms currently, vision changes, weakness, hearing loss, fevers -she wants to see a neurologist as this is very upsetting to her   ROS negative for unless reported above: fevers, unintentional weight loss, hearing or vision loss, chest pain, palpitations, struggling to breath, hemoptysis, melena, hematochezia, hematuria, falls, loc, si, thoughts of self harm  Past Medical  History  Diagnosis Date  . Arthritis   . GERD (gastroesophageal reflux disease)   . Hyperlipidemia   . Hiatal hernia   . Bursitis   . Diverticulitis 11/2004  . Diarrhea   . UTI (urinary tract infection)   . Endometriosis 1994  . Cancer 04/16/09    kidney cancer    Past Surgical History  Procedure Laterality Date  . Oophorectomy    . Tonsillectomy    . Nephrectomy  04/16/09    Partial removal due to cancer  . Lung biopsy  1996    Negative  . Abdominal hysterectomy  1994    TAH/BSO due to endometriosis    Family History  Problem Relation Age of Onset  . Cancer Father   . Colon cancer Neg Hx   . Thyroid cancer Mother     History   Social History  . Marital Status: Married    Spouse Name: N/A  . Number of Children: 0  . Years of Education: N/A   Occupational History  . Retired    Social History Main Topics  . Smoking status: Former Smoker    Types: Cigarettes    Quit date: 08/25/1984  . Smokeless tobacco: Never Used  . Alcohol Use: No     Comment: Rare  . Drug Use: No  . Sexual Activity:    Partners: Male    Birth Control/ Protection: Surgical     Comment: TAH   Other Topics Concern  . None   Social History Narrative     Current outpatient prescriptions:  .  calcium-vitamin D (  OSCAL WITH D 500-200) 500-200 MG-UNIT per tablet, Take 1 tablet by mouth daily.  , Disp: , Rfl:  .  ergocalciferol (VITAMIN D2) 50000 UNITS capsule, Take 50,000 Units by mouth every 30 (thirty) days.  , Disp: , Rfl:  .  Estradiol (VAGIFEM) 10 MCG TABS vaginal tablet, USE TWICE WEEKLY AS DIRECTED, Disp: 24 tablet, Rfl: 3 .  estradiol (VIVELLE-DOT) 0.025 MG/24HR, PLACE 1 PATCH ONTO THE SKIN 2 (TWO) TIMES A WEEK., Disp: 24 patch, Rfl: 2 .  ibuprofen (ADVIL,MOTRIN) 600 MG tablet, Take 1 tablet (600 mg total) by mouth every 8 (eight) hours as needed., Disp: 30 tablet, Rfl: 0 .  Multiple Vitamin (MULTIVITAMIN) tablet, Take 1 tablet by mouth daily.  , Disp: , Rfl:  .  NONFORMULARY OR  COMPOUNDED ITEM, Triamcinolone 0.1 % and Silvadene Cream 3:1 ratio, use BID 60 gm, Disp: 1 each, Rfl: 3 .  omeprazole (PRILOSEC) 40 MG capsule, Take 40 mg by mouth 2 (two) times daily. , Disp: , Rfl:  .  PATADAY 0.2 % SOLN, Place 1 drop into both eyes as needed., Disp: , Rfl:   EXAM:  Filed Vitals:   12/08/14 1121  BP: 118/82  Pulse: 75  Temp: 97.8 F (36.6 C)    Body mass index is 28.74 kg/(m^2).  GENERAL: vitals reviewed and listed above, alert, oriented, appears well hydrated and in no acute distress  HEENT: atraumatic, conjunttiva clear, no obvious abnormalities on inspection of external nose and ears  NECK: no obvious masses on inspection  LUNGS: clear to auscultation bilaterally, no wheezes, rales or rhonchi, good air movement  CV: HRRR, no peripheral edema  MS: moves all extremities without noticeable abnormality  PSYCH: pleasant and cooperative, no obvious depression or anxiety  ASSESSMENT AND PLAN:  Discussed the following assessment and plan:  Hepatic cirrhosis, unspecified hepatic cirrhosis type - Plan: Ambulatory referral to Gastroenterology  Gastroesophageal reflux disease, esophagitis presence not specified - Plan: Ambulatory referral to Gastroenterology  Esophageal stricture - Plan: Ambulatory referral to Gastroenterology  Vertigo - Plan: Ambulatory referral to Neurology  Encounter to establish care  -We reviewed the PMH, PSH, FH, SH, Meds and Allergies. -We provided refills for any medications we will prescribe as needed. -We addressed current concerns per orders and patient instructions. -We have asked for records for pertinent exams, studies, vaccines and notes from previous providers. -We have advised patient to follow up per instructions below. -we spent a long time reviewing her GI hx and she wants to see a different gastroenterologist and prefers to see [facility]  - a referral was placed per her request. Advised healthy low fat diet, regular  exercise and avoidance of hepatotoxic medications -advised my assistant to obtain records from [facility] regarding her hx of renal ca -she reported her vertigo symptoms at the end of a long visit and we discussed likely and potential etiologies with atypical migraine,  BPPV more likely given long duration of intermittent episodes - she wants to see a neurologist for this as she feels this has been going on to long and is really aggravated by this and did not want to pursue eval or treatment here - referral placed -needs labs and has scheduled wellness visit for this  -Patient advised to return or notify a doctor immediately if symptoms worsen or persist or new concerns arise.  Patient Instructions  BEFORE YOU LEAVE: -please schedule medicare wellness visit in 3 month  -We placed a referral for you as discussed to the neurologist per  your request and to [facility] gastroenterology per your request. It usually takes about 1-2 weeks to process and schedule this referral. If you have not heard from [place] regarding this appointment in 2 weeks please contact our office.  We recommend the following healthy lifestyle measures: - eat a healthy diet consisting of lots of vegetables, fruits, beans, nuts, seeds, healthy meats such as white chicken and fish and whole grains.  - avoid fried foods, fast food, processed foods, sodas, red meet and other fattening foods.  - get a least 150 minutes of aerobic exercise per week.       Colin Benton R.

## 2014-12-08 NOTE — Telephone Encounter (Signed)
Thank you for the update!

## 2014-12-08 NOTE — Progress Notes (Signed)
Pre visit review using our clinic review tool, if applicable. No additional management support is needed unless otherwise documented below in the visit note. 

## 2014-12-14 ENCOUNTER — Telehealth: Payer: Self-pay | Admitting: Gastroenterology

## 2014-12-14 NOTE — Telephone Encounter (Signed)
I cannot tell. Would you review her chart and let me know?

## 2014-12-15 NOTE — Telephone Encounter (Signed)
She is due for a colonoscopy

## 2014-12-19 NOTE — Telephone Encounter (Signed)
Patient is advised she is due a colonoscopy. She declines to schedule. Her husband has recently had cataract surgery and she is unable to commit to a time and date.

## 2014-12-21 ENCOUNTER — Encounter: Payer: Self-pay | Admitting: Gastroenterology

## 2014-12-22 ENCOUNTER — Ambulatory Visit (INDEPENDENT_AMBULATORY_CARE_PROVIDER_SITE_OTHER): Payer: Medicare Other | Admitting: Neurology

## 2014-12-22 ENCOUNTER — Encounter: Payer: Self-pay | Admitting: Neurology

## 2014-12-22 VITALS — BP 130/68 | HR 68 | Resp 16 | Ht 63.0 in | Wt 162.3 lb

## 2014-12-22 DIAGNOSIS — R42 Dizziness and giddiness: Secondary | ICD-10-CM | POA: Diagnosis not present

## 2014-12-22 NOTE — Patient Instructions (Signed)
I don't think the vertigo is caused by the "rocks" in your head.  It seems like possible migraine (vestibular migraine or vertigo associated migraine).  Of course that is a diagnosis of exclusion.  I don't suspect anything serious since you have had it episodically for many years.  However, an MRI of the brain is an option.  I would probably start a migraine preventative medication to see if it helps reduce frequency or intensity of the episodes.  Options include blood pressure medication such as atenolol or propranolol, or antidepressants such as nortriptyline or Effexor, or a seizure medication such as topamax.  The topamax and antidepressants must be used with caution if you have hepatic impairment, but they are not absolute contraindications.  Call me if you would like to try one.

## 2014-12-22 NOTE — Progress Notes (Signed)
NEUROLOGY CONSULTATION NOTE  Brenda [name] MRN: [id number] DOB: [birth year]  Referring provider: Dr. Maudie Mercury Primary care provider: Dr. 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  Records and labs reviewed.  She began experiencing the vertigo when she was 69 years old.  They are episodic, occuring about 4 times a year.  It usually occurs when there is change in weather.  She describes it as continuous spinning sensation usually lasting 2 to 7 days.  It is accompanied by an intense bi-frontal 7/10 pressure-like pain.  She usually takes Advil or Aleve for the pain, which is ineffective.  She is limited to what she can take due to the hepatitis.  She reports associated phonophobia.  There is no associated nausea, tinnitus, hearing loss, aural fullness, visual disturbance, photophobia or focal numbness or weakness.  It is worse when she is laying down and more tolerable when she is sitting up.  It is not exacerbated or triggered by quick movements of the head.  She cannot walk straight.  Her head tends to tilt to the right.  It is debilitating and she is housebound during a spell.  She has taken meclizine which causes extreme fatigue.  In between episodes, she is fine, although she says that her left leg will sometimes cross over her right leg when she walks.    Her last episode was in March.  It was more intense and lasted longer, about 2 weeks.  However, she had a sinus infection at the time, which may have contributed to it.  She reports no personal history of headache or migraine.  She denies family history of migraine.  History of medication-induced hepatitis.  No recent LFTs, but last panel from 2013 was normal.  PAST MEDICAL HISTORY: Past Medical History  Diagnosis  Date  . GERD (gastroesophageal reflux disease)     hx hiatal hernia, hx esophageal stricture s/p dilation  . Hyperlipidemia   . Diverticulitis 11/2004  . Endometriosis 1994  . Cancer 04/16/09    kidney cancer - tx by [facility] per her report released from f/u  . Vertigo     associated with headache, intermittent, chronic  . Hx of hepatitis     with cirrhosis - per her report from [place] and eval in 2013 at [place]  . Shingles   . Arthritis     PAST SURGICAL HISTORY: Past Surgical History  Procedure Laterality Date  . Oophorectomy    . Tonsillectomy    . Nephrectomy  04/16/09    Partial removal due to cancer  . Lung biopsy  1996    Negative  . Abdominal hysterectomy  1994    TAH/BSO due to endometriosis    MEDICATIONS: Current Outpatient Prescriptions on File Prior to Visit  Medication Sig Dispense Refill  . calcium-vitamin D (OSCAL WITH D 500-200) 500-200 MG-UNIT per tablet Take 1 tablet by mouth daily.      . Estradiol (VAGIFEM) 10 MCG TABS vaginal tablet USE TWICE WEEKLY AS DIRECTED 24 tablet 3  . estradiol (VIVELLE-DOT) 0.025 MG/24HR PLACE 1 PATCH ONTO THE SKIN 2 (TWO) TIMES A WEEK. 24 patch 2  . ibuprofen (ADVIL,MOTRIN) 600 MG tablet Take 1 tablet (600 mg total) by mouth every 8 (eight) hours as needed. 30 tablet 0  . Multiple Vitamin (  MULTIVITAMIN) tablet Take 1 tablet by mouth daily.      . NONFORMULARY OR COMPOUNDED ITEM Triamcinolone 0.1 % and Silvadene Cream 3:1 ratio, use BID 60 gm 1 each 3  . omeprazole (PRILOSEC) 40 MG capsule Take 40 mg by mouth 2 (two) times daily.     Marland Kitchen PATADAY 0.2 % SOLN Place 1 drop into both eyes as needed.    . ergocalciferol (VITAMIN D2) 50000 UNITS capsule Take 50,000 Units by mouth every 30 (thirty) days.       No current facility-administered medications on file prior to visit.    ALLERGIES: Allergies  Allergen Reactions  . Codeine     REACTION: nausea---CNS side effects  . Nitrofurantoin     REACTION: sick--? hepatitis    . Septra [Sulfamethoxazole-Trimethoprim]     FAMILY HISTORY: Family History  Problem Relation Age of Onset  . Cancer Father   . Colon cancer Neg Hx   . Thyroid cancer Mother   . Cancer Mother     thyroid/ renal cell ca     SOCIAL HISTORY: History   Social History  . Marital Status: Married    Spouse Name: N/A  . Number of Children: 0  . Years of Education: N/A   Occupational History  . Retired    Social History Main Topics  . Smoking status: Former Smoker    Types: Cigarettes    Quit date: 08/25/1984  . Smokeless tobacco: Never Used  . Alcohol Use: No     Comment: Rare  . Drug Use: No  . Sexual Activity:    Partners: Male    Birth Control/ Protection: Surgical     Comment: TAH   Other Topics Concern  . Not on file   Social History Narrative    REVIEW OF SYSTEMS: Constitutional: No fevers, chills, or sweats, no generalized fatigue, change in appetite Eyes: No visual changes, double vision, eye pain Ear, nose and throat: No hearing loss, ear pain, nasal congestion, sore throat Cardiovascular: No chest pain, palpitations Respiratory:  No shortness of breath at rest or with exertion, wheezes GastrointestinaI: No nausea, vomiting, diarrhea, abdominal pain, fecal incontinence Genitourinary:  No dysuria, urinary retention or frequency Musculoskeletal:  No neck pain, back pain Integumentary: No rash, pruritus, skin lesions Neurological: as above Psychiatric: No depression, insomnia, anxiety Endocrine: No palpitations, fatigue, diaphoresis, mood swings, change in appetite, change in weight, increased thirst Hematologic/Lymphatic:  No anemia, purpura, petechiae. Allergic/Immunologic: no itchy/runny eyes, nasal congestion, recent allergic reactions, rashes  PHYSICAL EXAM: Filed Vitals:   12/22/14 0953  BP: 130/68  Pulse: 68  Resp: 16   General: No acute distress Head:  Normocephalic/atraumatic Eyes:  fundi unremarkable, without vessel changes, exudates,  hemorrhages or papilledema. Neck: supple, no paraspinal tenderness, full range of motion Back: No paraspinal tenderness Heart: regular rate and rhythm Lungs: Clear to auscultation bilaterally. Vascular: No carotid bruits. Neurological Exam: Mental status: alert and oriented to person, place, and time, recent and remote memory intact, fund of knowledge intact, attention and concentration intact, speech fluent and not dysarthric, language intact. Cranial nerves: CN I: not tested CN II: pupils equal, round and reactive to light, visual fields intact, fundi unremarkable, without vessel changes, exudates, hemorrhages or papilledema. CN III, IV, VI:  full range of motion, no nystagmus, no ptosis CN V: facial sensation intact CN VII: upper and lower face symmetric CN VIII: hearing intact CN IX, X: gag intact, uvula midline CN XI: sternocleidomastoid and trapezius muscles intact CN XII: tongue midline  Bulk & Tone: normal, no fasciculations. Motor:  5/5 throughout Sensation:  Temperature and vibration intact Deep Tendon Reflexes:  1+ throughout except absent in ankles.  Toes downgoing. Finger to nose testing:  No dysmetria. Heel to shin:   No dysmetria. Gait:  Normal station.  She walks with slight limp as her right leg is slightly shorter than her left leg.  She has trouble walking in tandem.  . Romberg with sway.  IMPRESSION: Recurrent vertigo.  Given the duration of episodes, it is not consistent with benign paroxysmal positional vertigo.  She has no clear history of migraine, but with the accompanied headache, vestibular migraine is possible.    PLAN: Since these are the same habitual spells that are episodic for many years, I don't think MRI of the brain is absolutely warranted.  I would favor treating it as a possible migraine.  We discussed various medications, such as beta blockers, topiramate and antidepressants.  She is hesitant about starting a medication and she will research the  medications and get back to me if she would like to proceed.  Otherwise, there isn't anything to specifically do when she has the spells.  She is hesitant about pain relievers due to her hepatitis.  Thank you for allowing me to take part in the care of this patient.  Metta Clines, DO  CC:  Colin Benton, DO

## 2014-12-25 ENCOUNTER — Telehealth: Payer: Self-pay | Admitting: Neurology

## 2014-12-25 NOTE — Telephone Encounter (Signed)
Pt states that she talked to Dr Tomi Likens about going on blood pressure medication and she would like to try it please call her at [phone]

## 2014-12-25 NOTE — Telephone Encounter (Signed)
Please advise on below  

## 2014-12-25 NOTE — Telephone Encounter (Signed)
We can start propranolol 40mg  twice daily

## 2014-12-26 ENCOUNTER — Other Ambulatory Visit: Payer: Self-pay | Admitting: *Deleted

## 2014-12-26 MED ORDER — PROPRANOLOL HCL 20 MG PO TABS: ORAL_TABLET | ORAL | Status: DC

## 2014-12-26 NOTE — Telephone Encounter (Signed)
Propranolol 40 mg 1 po bid   Sent to pharmacy patient is aware

## 2015-01-10 ENCOUNTER — Other Ambulatory Visit: Payer: Self-pay | Admitting: *Deleted

## 2015-01-10 DIAGNOSIS — H8113 Benign paroxysmal vertigo, bilateral: Secondary | ICD-10-CM

## 2015-01-10 MED ORDER — PROPRANOLOL HCL 20 MG PO TABS: ORAL_TABLET | ORAL | Status: DC

## 2015-01-12 ENCOUNTER — Ambulatory Visit: Payer: BC Managed Care – PPO | Admitting: Neurology

## 2015-01-15 ENCOUNTER — Telehealth: Payer: Self-pay | Admitting: Neurology

## 2015-01-15 NOTE — Telephone Encounter (Signed)
I returned patients call and ask her to call back to office

## 2015-01-15 NOTE — Telephone Encounter (Signed)
[facility] returned your call/Dawn

## 2015-01-23 ENCOUNTER — Telehealth: Payer: Self-pay | Admitting: *Deleted

## 2015-01-23 NOTE — Telephone Encounter (Signed)
Patient returning your call  Call back number [phone]

## 2015-01-24 NOTE — Telephone Encounter (Signed)
Pt returned your call/please call her back on her cell#[phone]/Dawn

## 2015-01-24 NOTE — Telephone Encounter (Signed)
I have spoken with this patient as well as the Pharmacy the pharmacy made a mistake when filling this RX they filled it for the  inderall  20 mg instead of the 40 mg 1 PO BID they will make a correction the patient understands that she needs to be taking a total of 80 mg

## 2015-01-24 NOTE — Telephone Encounter (Signed)
RX correction has been made by Cristie Hem at pharmacy

## 2015-02-16 DIAGNOSIS — R194 Change in bowel habit: Secondary | ICD-10-CM | POA: Diagnosis not present

## 2015-02-16 DIAGNOSIS — R195 Other fecal abnormalities: Secondary | ICD-10-CM | POA: Diagnosis not present

## 2015-03-07 ENCOUNTER — Other Ambulatory Visit: Payer: Self-pay | Admitting: Gastroenterology

## 2015-03-07 DIAGNOSIS — K5730 Diverticulosis of large intestine without perforation or abscess without bleeding: Secondary | ICD-10-CM | POA: Diagnosis not present

## 2015-03-07 DIAGNOSIS — R197 Diarrhea, unspecified: Secondary | ICD-10-CM | POA: Diagnosis not present

## 2015-03-07 DIAGNOSIS — D120 Benign neoplasm of cecum: Secondary | ICD-10-CM | POA: Diagnosis not present

## 2015-03-07 DIAGNOSIS — D123 Benign neoplasm of transverse colon: Secondary | ICD-10-CM | POA: Diagnosis not present

## 2015-03-07 DIAGNOSIS — D121 Benign neoplasm of appendix: Secondary | ICD-10-CM | POA: Diagnosis not present

## 2015-03-07 LAB — HM COLONOSCOPY

## 2015-03-10 ENCOUNTER — Encounter: Payer: Self-pay | Admitting: Family Medicine

## 2015-03-10 ENCOUNTER — Ambulatory Visit (INDEPENDENT_AMBULATORY_CARE_PROVIDER_SITE_OTHER): Payer: Medicare Other | Admitting: Family Medicine

## 2015-03-10 VITALS — BP 122/80 | HR 62 | Temp 97.9°F | Resp 18 | Ht 63.0 in | Wt 160.0 lb

## 2015-03-10 DIAGNOSIS — H61892 Other specified disorders of left external ear: Secondary | ICD-10-CM

## 2015-03-10 DIAGNOSIS — H9202 Otalgia, left ear: Secondary | ICD-10-CM

## 2015-03-10 MED ORDER — TRIAMCINOLONE ACETONIDE 0.5 % EX CREA: 1.0000 "application " | TOPICAL_CREAM | Freq: Two times a day (BID) | CUTANEOUS | Status: DC

## 2015-03-10 NOTE — Patient Instructions (Signed)
Good to see you Ice may help Continue to monitor symptoms such as headache or visual changes and seek medical attention immediately if they worsen. Stop the Neosporin Try the triamcinolone 2 times daily and see if this is beneficial. Follow-up with her primary care physician in the next 1-2 weeks.

## 2015-03-10 NOTE — Progress Notes (Signed)
Pre visit review using our clinic review tool, if applicable. No additional management support is needed unless otherwise documented below in the visit note. 

## 2015-03-10 NOTE — Progress Notes (Signed)
  [facility] [address], [place] [address] Phone: [phone] Subjective:    CC: Possible infection in ear lobe  [id number] Brenda [name] is a 69 y.o. female coming in with complaint of complaint of redness and pain in the earlobe. Patient states This started approximately 2 months ago. Patient states that she noticed it while she was sitting outside. Patient states that there was some mild redness. Since then intimately patient has severe sharp pain on the upper inner side of the auricle of the ear. Patient states that he can get red and swollen as well. Patient denies any radiation to the face, any visual changes, any headache that is associated with it, or any neck pain or chest pain. Patient denies any injury to the area. Patient states that if touched multiple times and seems to be more uncomfortable. Patient states that she's been able to sleep comfortably. Denies any hearing loss or ringing in the ears. Patient states that when the pain does occur to be 7 out of 10. Naproxen does seem to be helpful.    Past medical history, social, surgical and family history all reviewed in electronic medical record.   Review of Systems: No headache, visual changes, nausea, vomiting, diarrhea, constipation, dizziness, abdominal pain, skin rash, fevers, chills, night sweats, weight loss, swollen lymph nodes, body aches, joint swelling, muscle aches, chest pain, shortness of breath, mood changes.   Objective Blood pressure 122/80, pulse 62, temperature 97.9 F (36.6 C), temperature source Oral, resp. rate 18, height 5\' 3"  (1.6 m), weight 160 lb (72.576 kg), last menstrual period 08/25/1992, SpO2 98 %.  General: No apparent distress alert and oriented x3 mood and affect normal, dressed appropriately.  HEENT: Pupils equal, extraocular movements intact, tympanic membrane normal with no erythema and no bulging noted. No retraction noted. Patient's ear has no redness  at this time were no abnormality noted. Patient is little uncomfortable during exam does not pain no lateralization of sound. Respiratory: Patient's speak in full sentences and does not appear short of breath  Cardiovascular: No lower extremity edema, non tender, no erythema  Skin: Warm dry intact with no signs of infection or rash on extremities or on axial skeleton.  Abdomen: Soft nontender  Neuro: Cranial nerves II through XII are intact, neurovascularly intact in all extremities with 2+ DTRs and 2+ pulses.  Lymph: No lymphadenopathy of posterior or anterior cervical chain or axillae bilaterally.  Gait mild antalgic gait  MSK:  Non tender with full range of motion and good stability and symmetric strength and tone of shoulders, elbows, wrist, hip, knee and ankles bilaterally.     Impression and Recommendations:     This case required medical decision making of moderate complexity.

## 2015-03-10 NOTE — Assessment & Plan Note (Signed)
No significant irritation noted. We discussed icing regimen just to be safe. We discussed triamcinolone. Does appear some mild irritation likely secondary to the Neosporin. Patient will continue to monitor. We discussed signs and symptoms of temporal arteritis chain went to seek medical attention. I do not feel labs are necessary at this time with patient not having significant headache or severe pain all the time. Patient denies any chest pain or anything else that is alarming that would warrant further workup. Patient will try these changes and follow up with primary care provider

## 2015-03-12 ENCOUNTER — Ambulatory Visit: Payer: BC Managed Care – PPO | Admitting: Family Medicine

## 2015-03-26 ENCOUNTER — Encounter: Payer: Self-pay | Admitting: Family Medicine

## 2015-03-26 ENCOUNTER — Ambulatory Visit (INDEPENDENT_AMBULATORY_CARE_PROVIDER_SITE_OTHER): Payer: Medicare Other | Admitting: Family Medicine

## 2015-03-26 VITALS — BP 120/76 | HR 65 | Temp 97.9°F | Ht 63.0 in | Wt 158.8 lb

## 2015-03-26 DIAGNOSIS — H9202 Otalgia, left ear: Secondary | ICD-10-CM | POA: Diagnosis not present

## 2015-03-26 DIAGNOSIS — E785 Hyperlipidemia, unspecified: Secondary | ICD-10-CM

## 2015-03-26 DIAGNOSIS — I10 Essential (primary) hypertension: Secondary | ICD-10-CM | POA: Diagnosis not present

## 2015-03-26 DIAGNOSIS — K759 Inflammatory liver disease, unspecified: Secondary | ICD-10-CM

## 2015-03-26 DIAGNOSIS — M25561 Pain in right knee: Secondary | ICD-10-CM | POA: Diagnosis not present

## 2015-03-26 DIAGNOSIS — Z0000 Encounter for general adult medical examination without abnormal findings: Secondary | ICD-10-CM | POA: Diagnosis not present

## 2015-03-26 DIAGNOSIS — K219 Gastro-esophageal reflux disease without esophagitis: Secondary | ICD-10-CM | POA: Diagnosis not present

## 2015-03-26 LAB — COMPREHENSIVE METABOLIC PANEL
ALK PHOS: 75 U/L (ref 39–117)
ALT: 15 U/L (ref 0–35)
AST: 34 U/L (ref 0–37)
Albumin: 4.1 g/dL (ref 3.5–5.2)
BUN: 10 mg/dL (ref 6–23)
CO2: 29 mEq/L (ref 19–32)
Calcium: 9.6 mg/dL (ref 8.4–10.5)
Chloride: 100 mEq/L (ref 96–112)
Creatinine, Ser: 0.75 mg/dL (ref 0.40–1.20)
GFR: 81.43 mL/min (ref >60.00)
Glucose, Bld: 87 mg/dL (ref 70–99)
Potassium: 4.2 mEq/L (ref 3.5–5.1)
SODIUM: 137 meq/L (ref 135–145)
Total Bilirubin: 0.5 mg/dL (ref 0.2–1.2)
Total Protein: 8.2 g/dL (ref 6.0–8.3)

## 2015-03-26 LAB — LIPID PANEL
CHOL/HDL RATIO: 5
CHOLESTEROL: 204 mg/dL — AB (ref 0–200)
HDL: 44.5 mg/dL (ref >39.00)
LDL Cholesterol: 134 mg/dL — ABNORMAL HIGH (ref 0–99)
NONHDL: 159.45
TRIGLYCERIDES: 127 mg/dL (ref 0.0–149.0)
VLDL: 25.4 mg/dL (ref 0.0–40.0)

## 2015-03-26 NOTE — Progress Notes (Signed)
Pre visit review using our clinic review tool, if applicable. No additional management support is needed unless otherwise documented below in the visit note. 

## 2015-03-26 NOTE — Patient Instructions (Signed)
BEFORE YOU LEAVE: -labs -knee  exercises  -Please lets [place] know if knee pain persists in 3-4 weeks  -follow up yearly and as needed  -We have ordered labs or studies at this visit. It can take up to 1-2 weeks for results and processing. We will contact you with instructions IF your results are abnormal. Normal results will be released to your [facility]. If you have not heard from [place] or can not find your results in [facility] in 2 weeks please contact our office.  We recommend the following healthy lifestyle measures: - eat a healthy diet consisting of lots of vegetables, fruits, beans, nuts, seeds, healthy meats such as white chicken and fish and whole grains.  - avoid fried foods, fast food, processed foods, sodas, red meet and other fattening foods.  - get a least 150 minutes of aerobic exercise per week.

## 2015-03-26 NOTE — Progress Notes (Signed)
Medicare Annual Preventive Care Visit  (initial annual wellness or annual wellness exam)  Concerns and/or follow up today:  R knee pain: -reports hx of OA in both knees -stepped in [place] a few weeks ago -has had some mild pain in this knee, worse with getting up and up and down the steps -did not swell, was able to bear weight weight on it -has not needed any medications, has used icy how - this helps  L ear pain, along ear helix, occ: -intermittent pain and redness of L ear for several weeks -[facility] gave her some cream that helps -may wants to see ENT -hearing loss, drainage, fevers  GERD/Drug induced hepatitis: -seeing GI, [facility] -reports had colonoscopy recent, one polyp that is precancerous that she is seeing a surgeon for next week -meds: PPI -denies: abd pain, bowel problems, melena, hematochezia  Vestibular Migraines/HTN: -meds: propranolol 20mg  bid by her neurologist, Dr. Tomi Likens -reports: reports doing well on this and better, but still occ symptoms   ROS: negative for report of fevers, unintentional weight loss, vision changes, vision loss, hearing loss or change, chest pain, sob, hemoptysis, melena, hematochezia, hematuria, genital discharge or lesions, falls, bleeding or bruising, loc, thoughts of suicide or self harm, memory loss  1.) Patient-completed health risk assessment  - completed and reviewed, see scanned documentation  2.) Review of Medical History: -PMH, PSH, Family History and current specialty and care providers reviewed and updated and listed below  - see scanned in document in chart and below  Past Medical History  Diagnosis Date  . GERD (gastroesophageal reflux disease)     hx hiatal hernia, hx esophageal stricture s/p dilation  . Hyperlipidemia   . Diverticulitis 11/2004  . Endometriosis 1994  . Cancer 04/16/09    kidney cancer - tx by [facility] per her report released from f/u  . Vertigo     associated with headache, intermittent, chronic   . Hx of hepatitis     with cirrhosis - per her report from [facility] and eval in 2013 at [facility]  . Shingles   . Arthritis   . Right knee pain   . Left ear pain     Past Surgical History  Procedure Laterality Date  . Oophorectomy    . Tonsillectomy    . Nephrectomy  04/16/09    Partial removal due to cancer  . Lung biopsy  1996    Negative  . Abdominal hysterectomy  1994    TAH/BSO due to endometriosis    History   Social History  . Marital Status: Married    Spouse Name: N/A  . Number of Children: 0  . Years of Education: N/A   Occupational History  . Retired    Social History Main Topics  . Smoking status: Former Smoker    Types: Cigarettes    Quit date: 08/25/1984  . Smokeless tobacco: Never Used  . Alcohol Use: No     Comment: Rare  . Drug Use: No  . Sexual Activity:    Partners: Male    Birth Control/ Protection: Surgical     Comment: TAH   Other Topics Concern  . Not on file   Social History Narrative    The patient has a family history of  3.) Review of functional ability and level of safety:  Any difficulty hearing?  NO  History of falling? NO  Any trouble with IADLs - using a phone, using transportation, grocery shopping, preparing meals, doing housework, doing laundry, taking  medications and managing money? NO  Advance Directives? YES  See summary of recommendations in Patient Instructions below.  4.) Physical Exam Filed Vitals:   03/26/15 0942  BP: 120/76  Pulse: 65  Temp: 97.9 F (36.6 C)   Estimated body mass index is 28.14 kg/(m^2) as calculated from the following:   Height as of this encounter: 5\' 3"  (1.6 m).   Weight as of this encounter: 158 lb 12.8 oz (72.031 kg).  EKG (optional): deferred  General: alert, appear well hydrated and in no acute distress  HEENT: visual acuity grossly intact, normal apperance of ear and nose bilat, normal appearance of ear canals and TMs, clear nasal congestion, mild post oropharyngeal  erythema with PND, no tonsillar edema or exudate, no sinus TTP  CV: HRRR  Lungs: CTA bilaterally  Psych: pleasant and cooperative, no obvious depression or anxiety  Cog function grossly intact  MS: Knee: -normal gait -normal inspection of knees and LE other then spider veins and some varicose veins - not in area of concern TTP is in area of distal attachment of IT band on the R, no bony or jt line TTP, neg lachman, neg drawer, neg val/var stress, ng mcmurry, no pat crepitus  See patient instructions for recommendations.  Education and counseling regarding the above review of health provided with a plan for the following: -see scanned patient completed form for further details -fall prevention strategies discussed  -healthy lifestyle discussed -importance and resources for completing advanced directives discussed -see patient instructions below for any other recommendations provided  4)The following written screening schedule of preventive measures were reviewed with assessment and plan made per below, orders and patient instructions:      AAA screening: NA     Alcohol screening: done     Obesity Screening and counseling: done     STI screening (Hep C if born 1945-65): declined     Tobacco Screening: done       Pneumococcal (PPSV23 -one dose after 64, one before if risk factors), influenza yearly and hepatitis B vaccines (if high risk - end stage renal disease, IV drugs, homosexual men, live in home for mentally retarded, hemophilia receiving factors) ASSESSMENT/PLAN: done      Screening mammograph (yearly if >40) ASSESSMENT/PLAN: done      Screening Pap smear/pelvic exam (q2 years) ASSESSMENT/PLAN: done with her gynecologist      Prostate cancer screening ASSESSMENT/PLAN: n/a      Colorectal cancer screening (FOBT yearly or flex sig q4y or colonoscopy q10y or barium enema q4y) ASSESSMENT/PLAN: done      Diabetes outpatient self-management training  services ASSESSMENT/PLAN: doing today      Bone mass measurements(covered q2y if indicated - estrogen def, osteoporosis, hyperparathyroid, vertebral abnormalities, osteoporosis or steroids) ASSESSMENT/PLAN: does with gyn      Screening for glaucoma(q1y if high risk - diabetes, FH, AA and > 89 or hispanic and > 65) ASSESSMENT/PLAN: sees optho yearly      Medical nutritional therapy for individuals with diabetes or renal disease ASSESSMENT/PLAN: done today      Cardiovascular screening blood tests (lipids q5y) ASSESSMENT/PLAN: done today      Diabetes screening tests ASSESSMENT/PLAN: done today   7.) Summary: -risk factors and conditions per above assessment were discussed and treatment, recommendations and referrals were offered per documentation above and orders and patient instructions.  Medicare annual wellness visit, subsequent  Right knee pain  - new problem  - suspect IT band strain, opted for HEP with follow up  in 3-4 weeks if persists or worsens -discussed tx for pain which she prefers to avoid  Ear pain, left -new problem -no findings on exam -suspect cutaneous nerve flushing -she opted to discuss with neuro and possibly see ENT if persists  Gastroesophageal reflux disease, esophagitis presence not specified Hepatitis -sees GI, seeing surgeon regarding colon polyp -she wants to check liver enzyme, added to panel today  Hyperlipemia - Plan: Lipid Panel -lifestyle recs, adjust medications if needed pending labs  Essential hypertension - Plan: CMP -cont curren tmedications  Patient Instructions  BEFORE YOU LEAVE: -labs -knee  exercises  -Please lets [place] know if knee pain persists in 3-4 weeks  -follow up yearly and as needed  -We have ordered labs or studies at this visit. It can take up to 1-2 weeks for results and processing. We will contact you with instructions IF your results are abnormal. Normal results will be released to your [facility]. If you have not  heard from [place] or can not find your results in [facility] in 2 weeks please contact our office.  We recommend the following healthy lifestyle measures: - eat a healthy diet consisting of lots of vegetables, fruits, beans, nuts, seeds, healthy meats such as white chicken and fish and whole grains.  - avoid fried foods, fast food, processed foods, sodas, red meet and other fattening foods.  - get a least 150 minutes of aerobic exercise per week.    The following written screening schedule of preventive measures were reviewed with assessment and plan made per below, orders and patient instructions:      AAA screening: NA     Alcohol screening: done     Obesity Screening and counseling: done     STI screening (Hep C if born [birth year]): declined     Tobacco Screening: done       Pneumococcal (PPSV23 -one dose after 64, one before if risk factors), influenza yearly and hepatitis B vaccines (if high risk - end stage renal disease, IV drugs, homosexual men, live in home for mentally retarded, hemophilia receiving factors) ASSESSMENT/PLAN: done      Screening mammograph (yearly if >40) ASSESSMENT/PLAN: done      Screening Pap smear/pelvic exam (q2 years) ASSESSMENT/PLAN: done with her gynecologist      Prostate cancer screening ASSESSMENT/PLAN: n/a      Colorectal cancer screening (FOBT yearly or flex sig q4y or colonoscopy q10y or barium enema q4y) ASSESSMENT/PLAN: done      Diabetes outpatient self-management training services ASSESSMENT/PLAN: doing today      Bone mass measurements(covered q2y if indicated - estrogen def, osteoporosis, hyperparathyroid, vertebral abnormalities, osteoporosis or steroids) ASSESSMENT/PLAN: does with gyn      Screening for glaucoma(q1y if high risk - diabetes, FH, AA and > 50 or hispanic and > 65) ASSESSMENT/PLAN: sees optho yearly      Medical nutritional therapy for individuals with diabetes or renal disease ASSESSMENT/PLAN: done today       Cardiovascular screening blood tests (lipids q5y) ASSESSMENT/PLAN: done today      Diabetes screening tests ASSESSMENT/PLAN: done today

## 2015-04-02 ENCOUNTER — Ambulatory Visit: Payer: Self-pay | Admitting: General Surgery

## 2015-04-02 DIAGNOSIS — K635 Polyp of colon: Secondary | ICD-10-CM | POA: Diagnosis not present

## 2015-04-02 NOTE — H&P (Signed)
History of Present Illness Brenda Ok MD; 04/02/2015 10:09 AM) Patient words: appendix polyp.  The patient is a 69 year old female who presents with a colonic polyp. Patient is a 69 year old female who is referred by [name] for evaluation of a appendiceal polyp. Patient underwent a routine surveillance colonoscopy which revealed a sessile serrated polyp at the appendiceal orifice. There is no high-grade dysplasia or malignancy identified. Patient also had a transverse colon polyp which was sessile serrated polyp in revealed no high-grade dysplasia or malignancy. Patient does have history of otherwise diarrhea. She states this began approximately a year ago.  patient also has undergone a partial right-sided nephrectomy for renal cell carcinoma by Dr. Marella Chimes stated in 2010.   Other Problems Marjean Donna, CMA; 04/02/2015 9:05 AM) Arthritis Cancer Diverticulosis Gastroesophageal Reflux Disease Hepatitis Migraine Headache Oophorectomy  Past Surgical History Marjean Donna, CMA; 04/02/2015 9:05 AM) Hysterectomy (not due to cancer) - Complete Nephrectomy Right. Tonsillectomy  Diagnostic Studies History Marjean Donna, CMA; 04/02/2015 9:05 AM) Colonoscopy within last year Mammogram 1-3 years ago  Allergies Davy Pique Bynum, CMA; 04/02/2015 9:07 AM) Codeine Phosphate *ANALGESICS - OPIOID* Septra *ANTI-INFECTIVE AGENTS - MISC.* Nitrofurantoin *URINARY ANTI-INFECTIVES*  Medication History (Sonya Bynum, CMA; 04/02/2015 9:09 AM) Oscal 500/200 D-3 (500-200MG -UNIT Tablet, Oral) Active. Vitamin D2 (2000UNIT Tablet, Oral) Active. Estradiol (10MCG Tablet, Vaginal) Active. Multivitamin Adult (Oral) Active. Pataday (0.2% Solution, Ophthalmic) Active. Propranolol HCl (20MG  Tablet, Oral) Active. Triamcinolone Acetonide (0.5% Cream, External) Active. Trimpex (100MG  Tablet, Oral) Active. Medications Reconciled  Social History Marjean Donna, CMA; 04/02/2015 9:05 AM) Alcohol use  Occasional alcohol use. No drug use Tobacco use Former smoker.  Family History Marjean Donna, CMA; 04/02/2015 9:05 AM) Arthritis Mother. Heart Disease Father. Melanoma Father. Thyroid problems Mother.  Pregnancy / Birth History Marjean Donna, [place]; 04/02/2015 9:05 AM) Age at menarche 36 years. Contraceptive History Oral contraceptives. Gravida 0 Para 0  Review of Systems (Sonya Bynum CMA; 04/02/2015 9:05 AM) General Not Present- Appetite Loss, Chills, Fatigue, Fever, Night Sweats, Weight Gain and Weight Loss. Skin Not Present- Change in Wart/Mole, Dryness, Hives, Jaundice, New Lesions, Non-Healing Wounds, Rash and Ulcer. HEENT Present- Seasonal Allergies and Wears glasses/contact lenses. Not Present- Earache, Hearing Loss, Hoarseness, Nose Bleed, Oral Ulcers, Ringing in the Ears, Sinus Pain, Sore Throat, Visual Disturbances and Yellow Eyes. Respiratory Not Present- Bloody sputum, Chronic Cough, Difficulty Breathing, Snoring and Wheezing. Breast Not Present- Breast Mass, Breast Pain, Nipple Discharge and Skin Changes. Cardiovascular Present- Leg Cramps and Swelling of Extremities. Not Present- Chest Pain, Difficulty Breathing Lying Down, Palpitations, Rapid Heart Rate and Shortness of Breath. Gastrointestinal Present- Chronic diarrhea and Indigestion. Not Present- Abdominal Pain, Bloating, Bloody Stool, Change in Bowel Habits, Constipation, Difficulty Swallowing, Excessive gas, Gets full quickly at meals, Hemorrhoids, Nausea, Rectal Pain and Vomiting. Female Genitourinary Not Present- Frequency, Nocturia, Painful Urination, Pelvic Pain and Urgency. Musculoskeletal Present- Joint Pain. Not Present- Back Pain, Joint Stiffness, Muscle Pain, Muscle Weakness and Swelling of Extremities. Neurological Not Present- Decreased Memory, Fainting, Headaches, Numbness, Seizures, Tingling, Tremor, Trouble walking and Weakness. Psychiatric Not Present- Anxiety, Bipolar, Change in Sleep Pattern,  Depression, Fearful and Frequent crying. Endocrine Present- Hot flashes. Not Present- Cold Intolerance, Excessive Hunger, Hair Changes, Heat Intolerance and New Diabetes. Hematology Not Present- Easy Bruising, Excessive bleeding, Gland problems, HIV and Persistent Infections.   Vitals (Sonya Bynum CMA; 04/02/2015 9:06 AM) 04/02/2015 9:05 AM Weight: 162 lb Height: 63in Body Surface Area: 1.81 m Body Mass Index: 28.7 kg/m Temp.: 2F(Temporal)  Pulse: 78 (Regular)  BP:  124/82 (Sitting, Left Arm, Standard)    Physical Exam Brenda Ok MD; 04/02/2015 10:07 AM) General Mental Status-Alert. General Appearance-Consistent with stated age. Hydration-Well hydrated. Voice-Normal.  Head and Neck Head-normocephalic, atraumatic with no lesions or palpable masses. Trachea-midline. Thyroid Gland Characteristics - normal size and consistency.  Chest and Lung Exam Chest and lung exam reveals -quiet, even and easy respiratory effort with no use of accessory muscles and on auscultation, normal breath sounds, no adventitious sounds and normal vocal resonance. Inspection Chest Wall - Normal. Back - normal.  Cardiovascular Cardiovascular examination reveals -normal heart sounds, regular rate and rhythm with no murmurs and normal pedal pulses bilaterally.  Abdomen Inspection Inspection of the abdomen reveals - No Hernias. Skin - Scar - Note: Lower midline inferior umbilical incision. Palpation/Percussion Palpation and Percussion of the abdomen reveal - Soft, Non Tender, No Rebound tenderness, No Rigidity (guarding) and No hepatosplenomegaly. Auscultation Auscultation of the abdomen reveals - Bowel sounds normal.    Assessment & Plan Brenda Ok MD; 04/02/2015 10:12 AM) POLYP, COLONIC (211.3  K63.5) Impression: 69 year old female with appendiceal orifice polyp, precancerous.  1. Will proceed to operative for laparoscopic partial Cectomy 2. Discussed with the  patient risks and benefits the procedure to include but not limited to: Infection, bleeding, damage to structures, possible staple line leak and possible abscess, possible need for further surgery. The patient voiced understanding and wishes to proceed.

## 2015-04-06 ENCOUNTER — Encounter (HOSPITAL_COMMUNITY)
Admission: RE | Admit: 2015-04-06 | Discharge: 2015-04-06 | Disposition: A | Discharge status: Home / Self Care | Payer: Medicare Other | Source: Ambulatory Visit | Attending: General Surgery | Admitting: General Surgery

## 2015-04-06 ENCOUNTER — Encounter (HOSPITAL_COMMUNITY): Payer: Self-pay

## 2015-04-06 ENCOUNTER — Other Ambulatory Visit: Payer: Self-pay

## 2015-04-06 DIAGNOSIS — K389 Disease of appendix, unspecified: Secondary | ICD-10-CM | POA: Insufficient documentation

## 2015-04-06 DIAGNOSIS — Z01812 Encounter for preprocedural laboratory examination: Secondary | ICD-10-CM | POA: Diagnosis not present

## 2015-04-06 DIAGNOSIS — Z01810 Encounter for preprocedural cardiovascular examination: Secondary | ICD-10-CM | POA: Insufficient documentation

## 2015-04-06 DIAGNOSIS — Z0183 Encounter for blood typing: Secondary | ICD-10-CM | POA: Diagnosis not present

## 2015-04-06 HISTORY — DX: Headache: R51

## 2015-04-06 HISTORY — DX: Personal history of other infectious and parasitic diseases: Z86.19

## 2015-04-06 HISTORY — DX: Headache, unspecified: R51.9

## 2015-04-06 HISTORY — DX: Urinary tract infection, site not specified: A49.9

## 2015-04-06 HISTORY — DX: Bronchitis, not specified as acute or chronic: J40

## 2015-04-06 HISTORY — DX: Inflammatory liver disease, unspecified: K75.9

## 2015-04-06 HISTORY — DX: Personal history of other diseases of the digestive system: Z87.19

## 2015-04-06 HISTORY — DX: Unspecified cataract: H26.9

## 2015-04-06 HISTORY — DX: Scarlet fever, uncomplicated: A38.9

## 2015-04-06 HISTORY — DX: Anesthesia of skin: R20.0

## 2015-04-06 HISTORY — DX: Migraine, unspecified, not intractable, without status migrainosus: G43.909

## 2015-04-06 HISTORY — DX: Anesthesia of skin: R20.2

## 2015-04-06 HISTORY — DX: Urinary tract infection, site not specified: N39.0

## 2015-04-06 LAB — CBC
HEMATOCRIT: 40.4 % (ref 36.0–46.0)
Hemoglobin: 13.4 g/dL (ref 12.0–15.0)
MCH: 31.8 pg (ref 26.0–34.0)
MCHC: 33.2 g/dL (ref 30.0–36.0)
MCV: 96 fL (ref 78.0–100.0)
Platelets: 346 10*3/uL (ref 150–400)
RBC: 4.21 MIL/uL (ref 3.87–5.11)
RDW: 13.7 % (ref 11.5–15.5)
WBC: 10.8 10*3/uL — ABNORMAL HIGH (ref 4.0–10.5)

## 2015-04-06 NOTE — Progress Notes (Signed)
Pt has allergy to metal staples - causes swelling and infection

## 2015-04-06 NOTE — Progress Notes (Signed)
CMP results per epic 03/26/2015

## 2015-04-06 NOTE — Patient Instructions (Signed)
Brenda [name]  04/06/2015   Your procedure is scheduled on: Tuesday April 10, 2015   Report to [facility] Main  Entrance take [place]  elevators to 3rd floor to  [place] at 9:30 AM.  Call this number if you have problems the morning of surgery [phone]   Remember: ONLY 1 PERSON MAY GO WITH YOU TO SHORT STAY TO GET  READY MORNING OF [place].  Do not eat food or drink liquids :After Midnight.     Take these medicines the morning of surgery with A SIP OF WATER: Omeprazole (Prilosec); Propranolol (Inderal); May use eye drops if needed (bring with you day of surgery)                               You may not have any metal on your body including hair pins and              piercings  Do not wear jewelry, make-up, lotions, powders or perfumes, deodorant             Do not wear nail polish.  Do not shave  48 hours prior to surgery.               Do not bring valuables to the hospital. [place].  Contacts, dentures or bridgework may not be worn into surgery.  Leave suitcase in the car. After surgery it may be brought to your room.                  Please read over the following fact sheets you were given:BLOOD TRANSFUSION INFORMATION SHEET _____________________________________________________________________             [facility] - Preparing for Surgery Before surgery, you can play an important role.  Because skin is not sterile, your skin needs to be as free of germs as possible.  You can reduce the number of germs on your skin by washing with CHG (chlorahexidine gluconate) soap before surgery.  CHG is an antiseptic cleaner which kills germs and bonds with the skin to continue killing germs even after washing. Please DO NOT use if you have an allergy to CHG or antibacterial soaps.  If your skin becomes reddened/irritated stop using the CHG and inform your nurse when you arrive at Short Stay. Do not  shave (including legs and underarms) for at least 48 hours prior to the first CHG shower.  You may shave your face/neck. Please follow these instructions carefully:  1.  Shower with CHG Soap the night before surgery and the  morning of Surgery.  2.  If you choose to wash your hair, wash your hair first as usual with your  normal  shampoo.  3.  After you shampoo, rinse your hair and body thoroughly to remove the  shampoo.                           4.  Use CHG as you would any other liquid soap.  You can apply chg directly  to the skin and wash                       Gently with  a scrungie or clean washcloth.  5.  Apply the CHG Soap to your body ONLY FROM THE NECK DOWN.   Do not use on face/ open                           Wound or open sores. Avoid contact with eyes, ears mouth and genitals (private parts).                       Wash face,  Genitals (private parts) with your normal soap.             6.  Wash thoroughly, paying special attention to the area where your surgery  will be performed.  7.  Thoroughly rinse your body with warm water from the neck down.  8.  DO NOT shower/wash with your normal soap after using and rinsing off  the CHG Soap.                9.  Pat yourself dry with a clean towel.            10.  Wear clean pajamas.            11.  Place clean sheets on your bed the night of your first shower and do not  sleep with pets. Day of Surgery : Do not apply any lotions/deodorants the morning of surgery.  Please wear clean clothes to the hospital/surgery center.  FAILURE TO FOLLOW THESE INSTRUCTIONS MAY RESULT IN THE CANCELLATION OF YOUR SURGERY PATIENT SIGNATURE_________________________________  NURSE SIGNATURE__________________________________  ________________________________________________________________________  WHAT IS A BLOOD TRANSFUSION? Blood Transfusion Information  A transfusion is the replacement of blood or some of its parts. Blood is made up of multiple cells  which provide different functions.  Red blood cells carry oxygen and are used for blood loss replacement.  White blood cells fight against infection.  Platelets control bleeding.  Plasma helps clot blood.  Other blood products are available for specialized needs, such as hemophilia or other clotting disorders. BEFORE THE TRANSFUSION  Who gives blood for transfusions?   Healthy volunteers who are fully evaluated to make sure their blood is safe. This is blood bank blood. Transfusion therapy is the safest it has ever been in the practice of medicine. Before blood is taken from a donor, a complete history is taken to make sure that person has no history of diseases nor engages in risky social behavior (examples are intravenous drug use or sexual activity with multiple partners). The donor's travel history is screened to minimize risk of transmitting infections, such as malaria. The donated blood is tested for signs of infectious diseases, such as HIV and hepatitis. The blood is then tested to be sure it is compatible with you in order to minimize the chance of a transfusion reaction. If you or a relative donates blood, this is often done in anticipation of surgery and is not appropriate for emergency situations. It takes many days to process the donated blood. RISKS AND COMPLICATIONS Although transfusion therapy is very safe and saves many lives, the main dangers of transfusion include:   Getting an infectious disease.  Developing a transfusion reaction. This is an allergic reaction to something in the blood you were given. Every precaution is taken to prevent this. The decision to have a blood transfusion has been considered carefully by your caregiver before blood is given. Blood is not given unless the benefits outweigh the risks.  AFTER THE TRANSFUSION  Right after receiving a blood transfusion, you will usually feel much better and more energetic. This is especially true if your red blood  cells have gotten low (anemic). The transfusion raises the level of the red blood cells which carry oxygen, and this usually causes an energy increase.  The nurse administering the transfusion will monitor you carefully for complications. HOME CARE INSTRUCTIONS  No special instructions are needed after a transfusion. You may find your energy is better. Speak with your caregiver about any limitations on activity for underlying diseases you may have. SEEK MEDICAL CARE IF:   Your condition is not improving after your transfusion.  You develop redness or irritation at the intravenous (IV) site. SEEK IMMEDIATE MEDICAL CARE IF:  Any of the following symptoms occur over the next 12 hours:  Shaking chills.  You have a temperature by mouth above 102 F (38.9 C), not controlled by medicine.  Chest, back, or muscle pain.  People around you feel you are not acting correctly or are confused.  Shortness of breath or difficulty breathing.  Dizziness and fainting.  You get a rash or develop hives.  You have a decrease in urine output.  Your urine turns a dark color or changes to pink, red, or brown. Any of the following symptoms occur over the next 10 days:  You have a temperature by mouth above 102 F (38.9 C), not controlled by medicine.  Shortness of breath.  Weakness after normal activity.  The white part of the eye turns yellow (jaundice).  You have a decrease in the amount of urine or are urinating less often.  Your urine turns a dark color or changes to pink, red, or brown. Document Released: 08/08/2000 Document Revised: 11/03/2011 Document Reviewed: 03/27/2008 [facility] Patient Information 2014 [place], [place].  _______________________________________________________________________

## 2015-04-09 DIAGNOSIS — H25013 Cortical age-related cataract, bilateral: Secondary | ICD-10-CM | POA: Diagnosis not present

## 2015-04-09 DIAGNOSIS — H04123 Dry eye syndrome of bilateral lacrimal glands: Secondary | ICD-10-CM | POA: Diagnosis not present

## 2015-04-09 DIAGNOSIS — H2513 Age-related nuclear cataract, bilateral: Secondary | ICD-10-CM | POA: Diagnosis not present

## 2015-04-10 ENCOUNTER — Encounter (HOSPITAL_COMMUNITY)
Admission: RE | Disposition: A | Discharge status: Home / Self Care | Payer: Self-pay | Source: Ambulatory Visit | Attending: General Surgery

## 2015-04-10 ENCOUNTER — Encounter (HOSPITAL_COMMUNITY): Payer: Self-pay | Admitting: Anesthesiology

## 2015-04-10 ENCOUNTER — Inpatient Hospital Stay (HOSPITAL_COMMUNITY): Payer: Medicare Other | Admitting: Anesthesiology

## 2015-04-10 ENCOUNTER — Observation Stay (HOSPITAL_COMMUNITY)
Admission: RE | Admit: 2015-04-10 | Discharge: 2015-04-11 | Disposition: A | Discharge status: Home / Self Care | Payer: Medicare Other | Source: Ambulatory Visit | Attending: General Surgery | Admitting: General Surgery

## 2015-04-10 DIAGNOSIS — K759 Inflammatory liver disease, unspecified: Secondary | ICD-10-CM | POA: Insufficient documentation

## 2015-04-10 DIAGNOSIS — K219 Gastro-esophageal reflux disease without esophagitis: Secondary | ICD-10-CM | POA: Insufficient documentation

## 2015-04-10 DIAGNOSIS — D120 Benign neoplasm of cecum: Secondary | ICD-10-CM | POA: Diagnosis not present

## 2015-04-10 DIAGNOSIS — Z79899 Other long term (current) drug therapy: Secondary | ICD-10-CM | POA: Diagnosis not present

## 2015-04-10 DIAGNOSIS — M1990 Unspecified osteoarthritis, unspecified site: Secondary | ICD-10-CM | POA: Insufficient documentation

## 2015-04-10 DIAGNOSIS — K7460 Unspecified cirrhosis of liver: Secondary | ICD-10-CM | POA: Diagnosis not present

## 2015-04-10 DIAGNOSIS — Z792 Long term (current) use of antibiotics: Secondary | ICD-10-CM | POA: Insufficient documentation

## 2015-04-10 DIAGNOSIS — Z85528 Personal history of other malignant neoplasm of kidney: Secondary | ICD-10-CM | POA: Diagnosis not present

## 2015-04-10 DIAGNOSIS — Z9049 Acquired absence of other specified parts of digestive tract: Secondary | ICD-10-CM

## 2015-04-10 DIAGNOSIS — K449 Diaphragmatic hernia without obstruction or gangrene: Secondary | ICD-10-CM | POA: Insufficient documentation

## 2015-04-10 DIAGNOSIS — Z905 Acquired absence of kidney: Secondary | ICD-10-CM | POA: Diagnosis not present

## 2015-04-10 DIAGNOSIS — G629 Polyneuropathy, unspecified: Secondary | ICD-10-CM | POA: Insufficient documentation

## 2015-04-10 DIAGNOSIS — K5790 Diverticulosis of intestine, part unspecified, without perforation or abscess without bleeding: Secondary | ICD-10-CM | POA: Insufficient documentation

## 2015-04-10 DIAGNOSIS — D121 Benign neoplasm of appendix: Secondary | ICD-10-CM | POA: Diagnosis not present

## 2015-04-10 DIAGNOSIS — Z87891 Personal history of nicotine dependence: Secondary | ICD-10-CM | POA: Diagnosis not present

## 2015-04-10 DIAGNOSIS — Z7952 Long term (current) use of systemic steroids: Secondary | ICD-10-CM | POA: Insufficient documentation

## 2015-04-10 DIAGNOSIS — K635 Polyp of colon: Secondary | ICD-10-CM | POA: Diagnosis present

## 2015-04-10 HISTORY — PX: LAPAROSCOPIC PARTIAL COLECTOMY: SHX5907

## 2015-04-10 LAB — TYPE AND SCREEN
ABO/RH(D): O POS
ANTIBODY SCREEN: NEGATIVE

## 2015-04-10 SURGERY — LAPAROSCOPIC PARTIAL COLECTOMY
Anesthesia: General

## 2015-04-10 MED ORDER — METOCLOPRAMIDE HCL 5 MG/ML IJ SOLN
INTRAMUSCULAR | Status: AC
  Filled 2015-04-10: qty 2

## 2015-04-10 MED ORDER — ONDANSETRON HCL 4 MG/2ML IJ SOLN: 4.0000 mg | Freq: Four times a day (QID) | INTRAMUSCULAR | Status: DC | PRN

## 2015-04-10 MED ORDER — OXYCODONE-ACETAMINOPHEN 5-325 MG PO TABS: 1.0000 | ORAL_TABLET | ORAL | Status: DC | PRN

## 2015-04-10 MED ORDER — LACTATED RINGERS IV SOLN: INTRAVENOUS | Status: DC

## 2015-04-10 MED ORDER — PROPOFOL 10 MG/ML IV BOLUS
INTRAVENOUS | Status: DC | PRN
  Administered 2015-04-10: 150 mg via INTRAVENOUS

## 2015-04-10 MED ORDER — FENTANYL CITRATE (PF) 250 MCG/5ML IJ SOLN
INTRAMUSCULAR | Status: AC
  Filled 2015-04-10: qty 25

## 2015-04-10 MED ORDER — FENTANYL CITRATE (PF) 100 MCG/2ML IJ SOLN
INTRAMUSCULAR | Status: DC | PRN
  Administered 2015-04-10 (×2): 50 ug via INTRAVENOUS
  Administered 2015-04-10: 100 ug via INTRAVENOUS

## 2015-04-10 MED ORDER — TRIMETHOPRIM 100 MG PO TABS
100.0000 mg | ORAL_TABLET | Freq: Every day | ORAL | Status: DC | PRN
  Filled 2015-04-10: qty 1

## 2015-04-10 MED ORDER — LACTATED RINGERS IV SOLN
INTRAVENOUS | Status: DC | PRN
  Administered 2015-04-10 (×2): via INTRAVENOUS

## 2015-04-10 MED ORDER — DIPHENHYDRAMINE HCL 25 MG PO CAPS
25.0000 mg | ORAL_CAPSULE | Freq: Four times a day (QID) | ORAL | Status: DC | PRN
  Administered 2015-04-10: 25 mg via ORAL
  Filled 2015-04-10: qty 1

## 2015-04-10 MED ORDER — DEXAMETHASONE SODIUM PHOSPHATE 10 MG/ML IJ SOLN
INTRAMUSCULAR | Status: AC
  Filled 2015-04-10: qty 1

## 2015-04-10 MED ORDER — HYDROMORPHONE HCL 1 MG/ML IJ SOLN: 0.5000 mg | INTRAMUSCULAR | Status: DC | PRN

## 2015-04-10 MED ORDER — PANTOPRAZOLE SODIUM 40 MG PO TBEC
40.0000 mg | DELAYED_RELEASE_TABLET | Freq: Every day | ORAL | Status: DC
  Administered 2015-04-11: 40 mg via ORAL
  Filled 2015-04-10 (×2): qty 1

## 2015-04-10 MED ORDER — DEXTROSE 5 % IV SOLN
2.0000 g | INTRAVENOUS | Status: AC
  Administered 2015-04-10: 2 g via INTRAVENOUS
  Filled 2015-04-10: qty 2

## 2015-04-10 MED ORDER — HYDROCODONE-ACETAMINOPHEN 5-325 MG PO TABS: 1.0000 | ORAL_TABLET | ORAL | Status: DC | PRN

## 2015-04-10 MED ORDER — METOCLOPRAMIDE HCL 5 MG/ML IJ SOLN
INTRAMUSCULAR | Status: DC | PRN
  Administered 2015-04-10: 10 mg via INTRAVENOUS

## 2015-04-10 MED ORDER — ROCURONIUM BROMIDE 100 MG/10ML IV SOLN
INTRAVENOUS | Status: DC | PRN
Start: 2015-04-10 — End: 2015-04-10
  Administered 2015-04-10: 20 mg via INTRAVENOUS

## 2015-04-10 MED ORDER — PROPOFOL 10 MG/ML IV BOLUS
INTRAVENOUS | Status: AC
  Filled 2015-04-10: qty 20

## 2015-04-10 MED ORDER — EPHEDRINE SULFATE 50 MG/ML IJ SOLN
INTRAMUSCULAR | Status: AC
  Filled 2015-04-10: qty 3

## 2015-04-10 MED ORDER — GLYCOPYRROLATE 0.2 MG/ML IJ SOLN
INTRAMUSCULAR | Status: DC | PRN
  Administered 2015-04-10: 0.6 mg via INTRAVENOUS

## 2015-04-10 MED ORDER — SIMETHICONE 80 MG PO CHEW
40.0000 mg | CHEWABLE_TABLET | Freq: Four times a day (QID) | ORAL | Status: DC | PRN
  Filled 2015-04-10: qty 1

## 2015-04-10 MED ORDER — NEOSTIGMINE METHYLSULFATE 10 MG/10ML IV SOLN
INTRAVENOUS | Status: DC | PRN
  Administered 2015-04-10: 4 mg via INTRAVENOUS

## 2015-04-10 MED ORDER — LACTATED RINGERS IV SOLN
INTRAVENOUS | Status: DC | PRN
  Administered 2015-04-10: 1000 mL

## 2015-04-10 MED ORDER — EPHEDRINE SULFATE 50 MG/ML IJ SOLN
INTRAMUSCULAR | Status: DC | PRN
  Administered 2015-04-10: 10 mg via INTRAVENOUS

## 2015-04-10 MED ORDER — LIDOCAINE HCL (CARDIAC) 20 MG/ML IV SOLN
INTRAVENOUS | Status: AC
  Filled 2015-04-10: qty 10

## 2015-04-10 MED ORDER — ARTIFICIAL TEARS OP OINT
TOPICAL_OINTMENT | OPHTHALMIC | Status: AC
  Filled 2015-04-10: qty 3.5

## 2015-04-10 MED ORDER — BUPIVACAINE-EPINEPHRINE (PF) 0.25% -1:200000 IJ SOLN
INTRAMUSCULAR | Status: AC
  Filled 2015-04-10: qty 30

## 2015-04-10 MED ORDER — SODIUM CHLORIDE 0.9 % IJ SOLN
INTRAMUSCULAR | Status: AC
  Filled 2015-04-10: qty 30

## 2015-04-10 MED ORDER — ONDANSETRON HCL 4 MG/2ML IJ SOLN
INTRAMUSCULAR | Status: AC
  Filled 2015-04-10: qty 2

## 2015-04-10 MED ORDER — PROPRANOLOL HCL 40 MG PO TABS
40.0000 mg | ORAL_TABLET | Freq: Two times a day (BID) | ORAL | Status: DC
  Administered 2015-04-10 – 2015-04-11 (×2): 40 mg via ORAL
  Filled 2015-04-10 (×4): qty 1

## 2015-04-10 MED ORDER — CEFOTETAN DISODIUM-DEXTROSE 2-2.08 GM-% IV SOLR
INTRAVENOUS | Status: AC
  Filled 2015-04-10: qty 50

## 2015-04-10 MED ORDER — LIDOCAINE HCL (CARDIAC) 20 MG/ML IV SOLN
INTRAVENOUS | Status: DC | PRN
  Administered 2015-04-10: 50 mg via INTRAVENOUS

## 2015-04-10 MED ORDER — HYDROMORPHONE HCL 1 MG/ML IJ SOLN
INTRAMUSCULAR | Status: AC
  Filled 2015-04-10: qty 1

## 2015-04-10 MED ORDER — SODIUM CHLORIDE 0.9 % IR SOLN
Status: DC | PRN
  Administered 2015-04-10: 1000 mL

## 2015-04-10 MED ORDER — CISATRACURIUM BESYLATE 20 MG/10ML IV SOLN
INTRAVENOUS | Status: AC
  Filled 2015-04-10: qty 10

## 2015-04-10 MED ORDER — ENOXAPARIN SODIUM 40 MG/0.4ML ~~LOC~~ SOLN
40.0000 mg | SUBCUTANEOUS | Status: DC
  Administered 2015-04-10: 40 mg via SUBCUTANEOUS
  Filled 2015-04-10 (×2): qty 0.4

## 2015-04-10 MED ORDER — ONDANSETRON HCL 4 MG/2ML IJ SOLN
INTRAMUSCULAR | Status: DC | PRN
  Administered 2015-04-10: 4 mg via INTRAVENOUS

## 2015-04-10 MED ORDER — GLYCOPYRROLATE 0.2 MG/ML IJ SOLN
INTRAMUSCULAR | Status: AC
  Filled 2015-04-10: qty 2

## 2015-04-10 MED ORDER — BUPIVACAINE-EPINEPHRINE 0.25% -1:200000 IJ SOLN
INTRAMUSCULAR | Status: DC | PRN
Start: 2015-04-10 — End: 2015-04-10
  Administered 2015-04-10: 6 mL

## 2015-04-10 MED ORDER — DEXTROSE-NACL 5-0.9 % IV SOLN
INTRAVENOUS | Status: DC
  Administered 2015-04-10: 18:00:00 via INTRAVENOUS

## 2015-04-10 MED ORDER — NEOSTIGMINE METHYLSULFATE 10 MG/10ML IV SOLN
INTRAVENOUS | Status: AC
  Filled 2015-04-10: qty 1

## 2015-04-10 MED ORDER — SUCCINYLCHOLINE CHLORIDE 20 MG/ML IJ SOLN
INTRAMUSCULAR | Status: DC | PRN
  Administered 2015-04-10: 100 mg via INTRAVENOUS

## 2015-04-10 MED ORDER — CITRIC ACID-SODIUM CITRATE 334-500 MG/5ML PO SOLN
ORAL | Status: AC
  Filled 2015-04-10: qty 15

## 2015-04-10 MED ORDER — ONDANSETRON 4 MG PO TBDP: 4.0000 mg | ORAL_TABLET | Freq: Four times a day (QID) | ORAL | Status: DC | PRN

## 2015-04-10 MED ORDER — HYDROMORPHONE HCL 1 MG/ML IJ SOLN
0.2500 mg | INTRAMUSCULAR | Status: DC | PRN
  Administered 2015-04-10 (×4): 0.5 mg via INTRAVENOUS

## 2015-04-10 MED ORDER — DEXAMETHASONE SODIUM PHOSPHATE 10 MG/ML IJ SOLN
INTRAMUSCULAR | Status: DC | PRN
  Administered 2015-04-10: 10 mg via INTRAVENOUS

## 2015-04-10 MED ORDER — ROCURONIUM BROMIDE 100 MG/10ML IV SOLN
INTRAVENOUS | Status: AC
  Filled 2015-04-10: qty 1

## 2015-04-10 MED ORDER — CITRIC ACID-SODIUM CITRATE 334-500 MG/5ML PO SOLN
ORAL | Status: DC | PRN
  Administered 2015-04-10: 15 mL via ORAL

## 2015-04-10 MED ORDER — ALVIMOPAN 12 MG PO CAPS
12.0000 mg | ORAL_CAPSULE | Freq: Once | ORAL | Status: AC
  Administered 2015-04-10: 12 mg via ORAL
  Filled 2015-04-10: qty 1

## 2015-04-10 SURGICAL SUPPLY — 67 items
APL SKNCLS STERI-STRIP NONHPOA (GAUZE/BANDAGES/DRESSINGS) ×1
APPLIER CLIP 5 13 M/L LIGAMAX5 (MISCELLANEOUS)
APPLIER CLIP ROT 10 11.4 M/L (STAPLE) ×3
APR CLP MED LRG 11.4X10 (STAPLE) ×1
APR CLP MED LRG 5 ANG JAW (MISCELLANEOUS)
BAG SPEC RTRVL 10 TROC 200 (ENDOMECHANICALS) ×1
BENZOIN TINCTURE PRP APPL 2/3 (GAUZE/BANDAGES/DRESSINGS) ×2 IMPLANT
BLADE EXTENDED COATED 6.5IN (ELECTRODE) ×3 IMPLANT
BLADE HEX COATED 2.75 (ELECTRODE) ×3 IMPLANT
CABLE HIGH FREQUENCY MONO STRZ (ELECTRODE) ×2 IMPLANT
CELLS DAT CNTRL 66122 CELL SVR (MISCELLANEOUS) IMPLANT
CLIP APPLIE 5 13 M/L LIGAMAX5 (MISCELLANEOUS) IMPLANT
CLIP APPLIE ROT 10 11.4 M/L (STAPLE) ×1 IMPLANT
COVER SURGICAL LIGHT HANDLE (MISCELLANEOUS) ×3 IMPLANT
CUTTER FLEX LINEAR 45M (STAPLE) ×2 IMPLANT
DECANTER SPIKE VIAL GLASS SM (MISCELLANEOUS) ×3 IMPLANT
DEVICE TROCAR PUNCTURE CLOSURE (ENDOMECHANICALS) ×2 IMPLANT
DRAPE LAPAROSCOPIC ABDOMINAL (DRAPES) ×3 IMPLANT
ELECT REM PT RETURN 9FT ADLT (ELECTROSURGICAL) ×3
ELECTRODE REM PT RTRN 9FT ADLT (ELECTROSURGICAL) ×1 IMPLANT
ENSEAL DEVICE STD TIP 35CM (ENDOMECHANICALS) IMPLANT
GAUZE SPONGE 4X4 12PLY STRL (GAUZE/BANDAGES/DRESSINGS) ×3 IMPLANT
GLOVE BIO SURGEON STRL SZ7.5 (GLOVE) ×6 IMPLANT
GLOVE BIOGEL PI IND STRL 7.0 (GLOVE) ×1 IMPLANT
GLOVE BIOGEL PI INDICATOR 7.0 (GLOVE) ×2
GOWN STRL REUS W/ TWL XL LVL3 (GOWN DISPOSABLE) ×1 IMPLANT
GOWN STRL REUS W/TWL LRG LVL3 (GOWN DISPOSABLE) ×3 IMPLANT
GOWN STRL REUS W/TWL XL LVL3 (GOWN DISPOSABLE) ×6 IMPLANT
LEGGING LITHOTOMY PAIR STRL (DRAPES) ×3 IMPLANT
LIGASURE IMPACT 36 18CM CVD LR (INSTRUMENTS) ×3 IMPLANT
LIQUID BAND (GAUZE/BANDAGES/DRESSINGS) IMPLANT
NS IRRIG 1000ML POUR BTL (IV SOLUTION) ×3 IMPLANT
PACK COLON (CUSTOM PROCEDURE TRAY) ×3 IMPLANT
POUCH RETRIEVAL ECOSAC 10 (ENDOMECHANICALS) IMPLANT
POUCH RETRIEVAL ECOSAC 10MM (ENDOMECHANICALS) ×2
RELOAD STAPLE 45 3.5 BLU ETS (ENDOMECHANICALS) IMPLANT
RELOAD STAPLE TA45 3.5 REG BLU (ENDOMECHANICALS) ×6 IMPLANT
RETRACTOR WND ALEXIS 18 MED (MISCELLANEOUS) IMPLANT
RTRCTR WOUND ALEXIS 18CM MED (MISCELLANEOUS)
SCISSORS LAP 5X35 DISP (ENDOMECHANICALS) ×3 IMPLANT
SET IRRIG TUBING LAPAROSCOPIC (IRRIGATION / IRRIGATOR) ×3 IMPLANT
SHEARS HARMONIC ACE PLUS 36CM (ENDOMECHANICALS) ×3 IMPLANT
SLEEVE XCEL OPT CAN 5 100 (ENDOMECHANICALS) ×2 IMPLANT
SOLUTION ANTI FOG 6CC (MISCELLANEOUS) ×3 IMPLANT
SPONGE LAP 18X18 X RAY DECT (DISPOSABLE) ×6 IMPLANT
STAPLER VISISTAT 35W (STAPLE) ×3 IMPLANT
SUCTION POOLE TIP (SUCTIONS) ×3 IMPLANT
SUT PDS AB 1 CTX 36 (SUTURE) ×6 IMPLANT
SUT PROLENE 2 0 SH DA (SUTURE) ×3 IMPLANT
SUT SILK 2 0 (SUTURE) ×3
SUT SILK 2 0 SH CR/8 (SUTURE) ×3 IMPLANT
SUT SILK 2-0 18XBRD TIE 12 (SUTURE) ×1 IMPLANT
SUT SILK 3 0 (SUTURE) ×3
SUT SILK 3 0 SH CR/8 (SUTURE) ×3 IMPLANT
SUT SILK 3-0 18XBRD TIE 12 (SUTURE) ×1 IMPLANT
SUT VICRYL 0 UR6 27IN ABS (SUTURE) ×2 IMPLANT
TOWEL OR 17X26 10 PK STRL BLUE (TOWEL DISPOSABLE) ×6 IMPLANT
TRAY FOLEY W/METER SILVER 14FR (SET/KITS/TRAYS/PACK) ×4 IMPLANT
TRAY FOLEY W/METER SILVER 16FR (SET/KITS/TRAYS/PACK) ×2 IMPLANT
TROCAR BLADELESS OPT 12M 100M (ENDOMECHANICALS) ×2 IMPLANT
TROCAR BLADELESS OPT 5 75 (ENDOMECHANICALS) ×3 IMPLANT
TROCAR XCEL BLUNT TIP 100MML (ENDOMECHANICALS) ×3 IMPLANT
TROCAR XCEL NON-BLD 11X100MML (ENDOMECHANICALS) ×3 IMPLANT
TROCAR XCEL UNIV SLVE 11M 100M (ENDOMECHANICALS) ×3 IMPLANT
TUBING FILTER THERMOFLATOR (ELECTROSURGICAL) ×3 IMPLANT
TUBING INSUFFLATION 10FT LAP (TUBING) ×3 IMPLANT
YANKAUER SUCT BULB TIP NO VENT (SUCTIONS) ×3 IMPLANT

## 2015-04-10 NOTE — Progress Notes (Signed)
Pt temperature low approximately 94 degrees Farenheit. MD notified. Warm blankets placed on pt. Encouraged incentive spirometer. Will continue to monitor.

## 2015-04-10 NOTE — Anesthesia Postprocedure Evaluation (Signed)
  Anesthesia Post-op Note  Patient: Brenda [name]  Procedure(s) Performed: Procedure(s) (LRB): LAPAROSCOPIC PARTIAL CECTOMY (N/A)  Patient Location: PACU  Anesthesia Type: General  Level of Consciousness: awake and alert   Airway and Oxygen Therapy: Patient Spontanous Breathing  Post-op Pain: mild  Post-op Assessment: Post-op Vital signs reviewed, Patient's Cardiovascular Status Stable, Respiratory Function Stable, Patent Airway and No signs of Nausea or vomiting  Last Vitals:  Filed Vitals:   04/10/15 1400  BP: 171/71  Pulse: 78  Temp:   Resp: 18    Post-op Vital Signs: stable   Complications: No apparent anesthesia complications

## 2015-04-10 NOTE — H&P (View-Only) (Signed)
History of Present Illness Ralene Ok MD; 04/02/2015 10:09 AM) Patient words: appendix polyp.  The patient is a 69 year old female who presents with a colonic polyp. Patient is a 69 year old female who is referred by [name] for evaluation of a appendiceal polyp. Patient underwent a routine surveillance colonoscopy which revealed a sessile serrated polyp at the appendiceal orifice. There is no high-grade dysplasia or malignancy identified. Patient also had a transverse colon polyp which was sessile serrated polyp in revealed no high-grade dysplasia or malignancy. Patient does have history of otherwise diarrhea. She states this began approximately a year ago.  patient also has undergone a partial right-sided nephrectomy for renal cell carcinoma by Dr. Marella Chimes stated in 2010.   Other Problems Marjean Donna, CMA; 04/02/2015 9:05 AM) Arthritis Cancer Diverticulosis Gastroesophageal Reflux Disease Hepatitis Migraine Headache Oophorectomy  Past Surgical History Marjean Donna, CMA; 04/02/2015 9:05 AM) Hysterectomy (not due to cancer) - Complete Nephrectomy Right. Tonsillectomy  Diagnostic Studies History Marjean Donna, CMA; 04/02/2015 9:05 AM) Colonoscopy within last year Mammogram 1-3 years ago  Allergies Davy Pique Bynum, CMA; 04/02/2015 9:07 AM) Codeine Phosphate *ANALGESICS - OPIOID* Septra *ANTI-INFECTIVE AGENTS - MISC.* Nitrofurantoin *URINARY ANTI-INFECTIVES*  Medication History (Sonya Bynum, CMA; 04/02/2015 9:09 AM) Oscal 500/200 D-3 (500-200MG -UNIT Tablet, Oral) Active. Vitamin D2 (2000UNIT Tablet, Oral) Active. Estradiol (10MCG Tablet, Vaginal) Active. Multivitamin Adult (Oral) Active. Pataday (0.2% Solution, Ophthalmic) Active. Propranolol HCl (20MG  Tablet, Oral) Active. Triamcinolone Acetonide (0.5% Cream, External) Active. Trimpex (100MG  Tablet, Oral) Active. Medications Reconciled  Social History Marjean Donna, CMA; 04/02/2015 9:05 AM) Alcohol use  Occasional alcohol use. No drug use Tobacco use Former smoker.  Family History Marjean Donna, CMA; 04/02/2015 9:05 AM) Arthritis Mother. Heart Disease Father. Melanoma Father. Thyroid problems Mother.  Pregnancy / Birth History Marjean Donna, [place]; 04/02/2015 9:05 AM) Age at menarche 40 years. Contraceptive History Oral contraceptives. Gravida 0 Para 0  Review of Systems (Sonya Bynum CMA; 04/02/2015 9:05 AM) General Not Present- Appetite Loss, Chills, Fatigue, Fever, Night Sweats, Weight Gain and Weight Loss. Skin Not Present- Change in Wart/Mole, Dryness, Hives, Jaundice, New Lesions, Non-Healing Wounds, Rash and Ulcer. HEENT Present- Seasonal Allergies and Wears glasses/contact lenses. Not Present- Earache, Hearing Loss, Hoarseness, Nose Bleed, Oral Ulcers, Ringing in the Ears, Sinus Pain, Sore Throat, Visual Disturbances and Yellow Eyes. Respiratory Not Present- Bloody sputum, Chronic Cough, Difficulty Breathing, Snoring and Wheezing. Breast Not Present- Breast Mass, Breast Pain, Nipple Discharge and Skin Changes. Cardiovascular Present- Leg Cramps and Swelling of Extremities. Not Present- Chest Pain, Difficulty Breathing Lying Down, Palpitations, Rapid Heart Rate and Shortness of Breath. Gastrointestinal Present- Chronic diarrhea and Indigestion. Not Present- Abdominal Pain, Bloating, Bloody Stool, Change in Bowel Habits, Constipation, Difficulty Swallowing, Excessive gas, Gets full quickly at meals, Hemorrhoids, Nausea, Rectal Pain and Vomiting. Female Genitourinary Not Present- Frequency, Nocturia, Painful Urination, Pelvic Pain and Urgency. Musculoskeletal Present- Joint Pain. Not Present- Back Pain, Joint Stiffness, Muscle Pain, Muscle Weakness and Swelling of Extremities. Neurological Not Present- Decreased Memory, Fainting, Headaches, Numbness, Seizures, Tingling, Tremor, Trouble walking and Weakness. Psychiatric Not Present- Anxiety, Bipolar, Change in Sleep Pattern,  Depression, Fearful and Frequent crying. Endocrine Present- Hot flashes. Not Present- Cold Intolerance, Excessive Hunger, Hair Changes, Heat Intolerance and New Diabetes. Hematology Not Present- Easy Bruising, Excessive bleeding, Gland problems, HIV and Persistent Infections.   Vitals (Sonya Bynum CMA; 04/02/2015 9:06 AM) 04/02/2015 9:05 AM Weight: 162 lb Height: 63in Body Surface Area: 1.81 m Body Mass Index: 28.7 kg/m Temp.: 63F(Temporal)  Pulse: 78 (Regular)  BP:  124/82 (Sitting, Left Arm, Standard)    Physical Exam Ralene Ok MD; 04/02/2015 10:07 AM) General Mental Status-Alert. General Appearance-Consistent with stated age. Hydration-Well hydrated. Voice-Normal.  Head and Neck Head-normocephalic, atraumatic with no lesions or palpable masses. Trachea-midline. Thyroid Gland Characteristics - normal size and consistency.  Chest and Lung Exam Chest and lung exam reveals -quiet, even and easy respiratory effort with no use of accessory muscles and on auscultation, normal breath sounds, no adventitious sounds and normal vocal resonance. Inspection Chest Wall - Normal. Back - normal.  Cardiovascular Cardiovascular examination reveals -normal heart sounds, regular rate and rhythm with no murmurs and normal pedal pulses bilaterally.  Abdomen Inspection Inspection of the abdomen reveals - No Hernias. Skin - Scar - Note: Lower midline inferior umbilical incision. Palpation/Percussion Palpation and Percussion of the abdomen reveal - Soft, Non Tender, No Rebound tenderness, No Rigidity (guarding) and No hepatosplenomegaly. Auscultation Auscultation of the abdomen reveals - Bowel sounds normal.    Assessment & Plan Ralene Ok MD; 04/02/2015 10:12 AM) POLYP, COLONIC (211.3  K63.5) Impression: 69 year old female with appendiceal orifice polyp, precancerous.  1. Will proceed to operative for laparoscopic partial Cectomy 2. Discussed with the  patient risks and benefits the procedure to include but not limited to: Infection, bleeding, damage to structures, possible staple line leak and possible abscess, possible need for further surgery. The patient voiced understanding and wishes to proceed.

## 2015-04-10 NOTE — Discharge Instructions (Signed)
PATIENT INSTRUCTIONS S/p Cectomy  FOLLOW-UP:  Please make an appointment with your physician in 2 week(s).  Call your physician immediately if you have any fevers greater than 102.5, drainage from your wound that is not clear or looks infected, persistent bleeding, increasing abdominal pain, problems urinating, or persistent nausea/vomiting.    WOUND CARE INSTRUCTIONS:  Keep a dry clean dressing on the wound if there is drainage. The initial bandage may be removed after 24 hours.  Once the wound has quit draining you may leave it open to air.  If clothing rubs against the wound or causes irritation and the wound is not draining you may cover it with a dry dressing during the daytime.  Try to keep the wound dry and avoid ointments on the wound unless directed to do so.  If the wound becomes bright red and painful or starts to drain infected material that is not clear, please contact your physician immediately. You should also call immediately if you start to drain large amounts of fluid from the wound, requiring you to change the bandage frequently.   If the wound though is mildly pink and has a thick firm ridge underneath it, this is normal, and is referred to as a healing ridge.  This will resolve over the next 4-6 weeks.  DIET:  You may eat any foods that you can tolerate.  It is a good idea to eat a high fiber diet and take in plenty of fluids to prevent constipation.  If you do become constipated you may want to take a mild laxative or take ducolax tablets on a daily basis until your bowel habits are regular.  Constipation can be very uncomfortable, along with straining, after recent abdominal surgery.  ACTIVITY:  You are encouraged to cough and deep breath or use your incentive spirometer if you were given one, every 15-30 minutes when awake.  This will help prevent respiratory complications and low grade fevers post-operatively.  You may want to hug a pillow when coughing and sneezing to add  additional support to the surgical area which will decrease pain during these times.  You are encouraged to walk and engage in light activity for the next two weeks.  You should not lift more than 20 pounds during this time frame as it could put you at increased risk for a post-operative hernia.  Twenty pounds is roughly equivalent to a plastic bag of groceries.    MEDICATIONS:  Try to take narcotic medications and anti-inflammatory medications, such as tylenol, ibuprofen, naprosyn, etc., with food.  This will minimize stomach upset from the medication.  Should you develop nausea and vomiting from the pain medication, or develop a rash, please discontinue the medication and contact your physician.  You should not drive, make important decisions, or operate machinery when taking narcotic pain medication.  QUESTIONS:  Please feel free to call your physician or the hospital operator if you have any questions, and they will be glad to assist you.

## 2015-04-10 NOTE — Anesthesia Preprocedure Evaluation (Signed)
Anesthesia Evaluation  Patient identified by MRN, date of birth, ID band Patient awake    Reviewed: Allergy & Precautions, H&P , NPO status , Patient's Chart, lab work & pertinent test results, reviewed documented beta blocker date and time   Airway Mallampati: II  TM Distance: >3 FB Neck ROM: full    Dental no notable dental hx. (+) Dental Advisory Given, Teeth Intact   Pulmonary neg pulmonary ROS, former smoker,  breath sounds clear to auscultation  Pulmonary exam normal       Cardiovascular Exercise Tolerance: Good negative cardio ROS Normal cardiovascular examRhythm:regular Rate:Normal     Neuro/Psych Recurrent vertigo. neuropathy negative neurological ROS  negative psych ROS   GI/Hepatic negative GI ROS, hiatal hernia, GERD-  Medicated and Poorly Controlled,(+) Cirrhosis -      , Hepatitis -, UnspecifiedEsophageal stricure   Endo/Other  negative endocrine ROS  Renal/GU negative Renal ROS  negative genitourinary   Musculoskeletal   Abdominal   Peds  Hematology negative hematology ROS (+)   Anesthesia Other Findings   Reproductive/Obstetrics negative OB ROS                             Anesthesia Physical Anesthesia Plan  ASA: III  Anesthesia Plan: General   Post-op Pain Management:    Induction: Intravenous, Rapid sequence and Cricoid pressure planned  Airway Management Planned: Oral ETT  Additional Equipment:   Intra-op Plan:   Post-operative Plan: Extubation in OR  Informed Consent: I have reviewed the patients History and Physical, chart, labs and discussed the procedure including the risks, benefits and alternatives for the proposed anesthesia with the patient or authorized representative who has indicated his/her understanding and acceptance.   Dental Advisory Given  Plan Discussed with: CRNA and Surgeon  Anesthesia Plan Comments:         Anesthesia Quick  Evaluation

## 2015-04-10 NOTE — Anesthesia Procedure Notes (Signed)
Procedure Name: Intubation Date/Time: 04/10/2015 12:06 PM Performed by: Yasuo Phimmasone, Virgel Gess Pre-anesthesia Checklist: Patient identified, Emergency Drugs available, Suction available, Patient being monitored and Timeout performed Patient Re-evaluated:Patient Re-evaluated prior to inductionOxygen Delivery Method: Circle system utilized Preoxygenation: Pre-oxygenation with 100% oxygen Intubation Type: IV induction, Cricoid Pressure applied and Rapid sequence Laryngoscope Size: Mac and 4 Grade View: Grade II Tube type: Oral Tube size: 7.5 mm Number of attempts: 1 Airway Equipment and Method: Stylet Placement Confirmation: ETT inserted through vocal cords under direct vision,  positive ETCO2,  CO2 detector and breath sounds checked- equal and bilateral Secured at: 21 cm Tube secured with: Tape Dental Injury: Teeth and Oropharynx as per pre-operative assessment  Comments: Intubation atraumatic by EMS.

## 2015-04-10 NOTE — Transfer of Care (Signed)
Immediate Anesthesia Transfer of Care Note  Patient: Brenda [name]  Procedure(s) Performed: Procedure(s): [place] (N/A)  Patient Location: PACU  Anesthesia Type:General  Level of Consciousness:  sedated, patient cooperative and responds to stimulation  Airway & Oxygen Therapy:Patient Spontanous Breathing and Patient connected to face mask oxgen  Post-op Assessment:  Report given to PACU RN and Post -op Vital signs reviewed and stable  Post vital signs:  Reviewed and stable  Last Vitals:  Filed Vitals:   04/10/15 1335  BP: 163/79  Pulse:   Temp: 36.3 C  Resp: 17    Complications: No apparent anesthesia complications

## 2015-04-10 NOTE — Op Note (Addendum)
04/10/2015  1:11 PM  PATIENT:  Brenda [name]  69 y.o. female  PRE-OPERATIVE DIAGNOSIS:  Cecal polyp  POST-OPERATIVE DIAGNOSIS:  Cecal polyp  PROCEDURE:  Procedure(s): LAPAROSCOPIC PARTIAL CECTOMY (N/A)  SURGEON:  Surgeon(s) and Role:    * Ralene Ok, MD - Primary  ANESTHESIA:   local and general  EBL:5cc   Total I/O In: 1000 [I.V.:1000] Out: 325 [Urine:300; Blood:25]  BLOOD ADMINISTERED:none  DRAINS: none   LOCAL MEDICATIONS USED:  BUPIVICAINE   SPECIMEN:  Source of Specimen:  Cecum and appendix  DISPOSITION OF SPECIMEN:  PATHOLOGY  COUNTS:  YES  TOURNIQUET:  * No tourniquets in log *  DICTATION: .Dragon Dictation  Indications for procedure: Patient is a 69 year old female who underwent surveillance colonoscopy which revealed a carpet-like lesion at the appendiceal orifice. Biopsies were obtained which revealed no high-grade dysplasia or malignancy. Patient was referred for complete excision of sessile polyp.  Details of procedure: After the patient was consented she was taken back to the operating room and placed in supine position with bilateral SCDs in place. General endotracheal anesthesia was administered. A Foley catheter was placed. The patient was prepped and draped in the usual sterile fashion. A timeout was called all facts were verified.  A Veress needle technique was used to insufflate the abdomen to 15 mmHg in the left subcostal margin. Subsequent to this a 5 mm trocar and camera then placed intra-abdominally. There was no injury to any intra-abdominal organs. At this time a 5 mm trochars placed in the left lower quadrant under direct visualization.  At this time there appeared to be some omental adhesions to the midline infra umbilical incision site. These were taken down with Harmonic scalpel. The patient was then positioned in Trendelenburg position. The cecum and the appendix were visualized. The mesoappendix was ligated using the Harmonic scalpel.  The pericecal fat was then circumferentially dissected away from the cecum. The left lower quadrant 5 trocar was upsized to a 12 mm trocar for the stapler device. At this time a Ethicon endostapler, 8 was then placed across the cecum clear of the terminal ileum and ileocecal valve. This was fired approximately 1 cm from the appendiceal orifice. A second firing of the 45 Ethicon stapler was used to transect the cecum completely.  An Endo Catch bag was in placed into the abdomen and the specimen placed into the bag. I visualized the staple line for hemostasis which was excellent, the staple line was intact. At this time the 12 mm trocar and the specimen and bag were then removed from the abdominal cavity. At this time the 12 mm trocar site was reapproximated using a Endo Close device and a 0 Vicryl times one. At this time the insufflation was evacuated. All port sites were removed. The port sites were then reapproximated using 4-0 Monocryl subcuticular fashion. The skin was dressed with surgical gauze and tape. The patient was awakened from general anesthesia and taken to the recovery room stable condition.   PLAN OF CARE: Admit to inpatient   PATIENT DISPOSITION:  PACU - hemodynamically stable.   Delay start of Pharmacological VTE agent (>24hrs) due to surgical blood loss or risk of bleeding: yes

## 2015-04-10 NOTE — Interval H&P Note (Signed)
History and Physical Interval Note:  04/10/2015 11:56 AM  Brenda [name]  has presented today for surgery, with the diagnosis of appendiceal polyp  The various methods of treatment have been discussed with the patient and family. After consideration of risks, benefits and other options for treatment, the patient has consented to  Procedure(s): [place] (N/A) as a surgical intervention .  The patient's history has been reviewed, patient examined, no change in status, stable for surgery.  I have reviewed the patient's chart and labs.  Questions were answered to the patient's satisfaction.     Rosario Jacks., Anne Hahn

## 2015-04-11 ENCOUNTER — Encounter (HOSPITAL_COMMUNITY): Payer: Self-pay | Admitting: General Surgery

## 2015-04-11 DIAGNOSIS — K5790 Diverticulosis of intestine, part unspecified, without perforation or abscess without bleeding: Secondary | ICD-10-CM | POA: Diagnosis not present

## 2015-04-11 DIAGNOSIS — M1990 Unspecified osteoarthritis, unspecified site: Secondary | ICD-10-CM | POA: Diagnosis not present

## 2015-04-11 DIAGNOSIS — Z905 Acquired absence of kidney: Secondary | ICD-10-CM | POA: Diagnosis not present

## 2015-04-11 DIAGNOSIS — K219 Gastro-esophageal reflux disease without esophagitis: Secondary | ICD-10-CM | POA: Diagnosis not present

## 2015-04-11 DIAGNOSIS — D120 Benign neoplasm of cecum: Secondary | ICD-10-CM | POA: Diagnosis not present

## 2015-04-11 DIAGNOSIS — Z85528 Personal history of other malignant neoplasm of kidney: Secondary | ICD-10-CM | POA: Diagnosis not present

## 2015-04-11 MED ORDER — HYDROCODONE-ACETAMINOPHEN 5-325 MG PO TABS: 1.0000 | ORAL_TABLET | ORAL | Status: DC | PRN

## 2015-04-12 ENCOUNTER — Telehealth: Payer: Self-pay | Admitting: *Deleted

## 2015-04-12 NOTE — Telephone Encounter (Signed)
Patient declines and office visit for transitional care management because she will follow up with her general surgeon.

## 2015-04-13 NOTE — Discharge Summary (Signed)
Physician Discharge Summary  Patient ID: Brenda [name] MRN: [id number] DOB/AGE: 04/09/1946 69 y.o.  Admit date: 04/10/2015 Discharge date: 04/13/2015  Admission Diagnoses: s/p lap cecal resection  Discharge Diagnoses:  Active Problems:   S/P partial resection of colon   Discharged Condition: good  Hospital Course: 69 y/o/ F with carpenting cecal polyp.  She was admitted postop to the floor.  She was started on a CLD And adv to a normal diet which she was tolerating well.  She had great pain control.  She was deemed stable for DC and Dc'd home.  Consults: None  Significant Diagnostic Studies: none  Treatments: surgery: as above  Discharge Exam: Blood pressure 104/47, pulse 64, temperature 97.7 F (36.5 C), temperature source Oral, resp. rate 18, height 5\' 3"  (1.6 m), weight 73.029 kg (161 lb), last menstrual period 08/25/1992, SpO2 98 %. General appearance: alert and cooperative Cardio: regular rate and rhythm, S1, S2 normal, no murmur, click, rub or gallop GI: soft, non-tender; bowel sounds normal; no masses,  no organomegaly  Disposition: 01-Home or Self Care     Medication List    TAKE these medications        calcium-vitamin D 500-200 MG-UNIT per tablet  Commonly known as:  OSCAL WITH D  Take 1 tablet by mouth daily.     estradiol 0.025 MG/24HR  Commonly known as:  VIVELLE-DOT  PLACE 1 PATCH ONTO THE SKIN 2 (TWO) TIMES A WEEK.     Estradiol 10 MCG Tabs vaginal tablet  Commonly known as:  VAGIFEM  USE TWICE WEEKLY AS DIRECTED     glucosamine-chondroitin 500-400 MG tablet  Take 2 tablets by mouth daily.     HYDROcodone-acetaminophen 5-325 MG per tablet  Commonly known as:  NORCO/VICODIN  Take 1-2 tablets by mouth every 4 (four) hours as needed for moderate pain.     ibuprofen 600 MG tablet  Commonly known as:  ADVIL,MOTRIN  Take 1 tablet (600 mg total) by mouth every 8 (eight) hours as needed.     multivitamin tablet  Take 1 tablet by mouth daily.     NONFORMULARY OR COMPOUNDED ITEM  Triamcinolone 0.1 % and Silvadene Cream 3:1 ratio, use BID 60 gm     omeprazole 40 MG capsule  Commonly known as:  PRILOSEC  Take 40 mg by mouth daily.     oxyCODONE-acetaminophen 5-325 MG per tablet  Commonly known as:  ROXICET  Take 1-2 tablets by mouth every 4 (four) hours as needed.     PATADAY 0.2 % Soln  Generic drug:  Olopatadine HCl  Place 1 drop into both eyes daily as needed (allergies).     PROBIOTIC DAILY PO  Take 2 tablets by mouth 2 (two) times daily.     propranolol 40 MG tablet  Commonly known as:  INDERAL  Take 40 mg by mouth 2 (two) times daily.     triamcinolone cream 0.5 %  Commonly known as:  KENALOG  Apply 1 application topically 2 (two) times daily. To affected areas.     trimethoprim 100 MG tablet  Commonly known as:  TRIMPEX  Take 100 mg by mouth daily as needed (prevent yeast infections).           Follow-up Information    Follow up with Reyes Ivan, MD. Schedule an appointment as soon as possible for a visit in 2 weeks.   Specialty:  General Surgery   Why:  For wound re-check   Contact information:   [address] [place] [address] [phone]       Signed: Rosario Jacks., Anne Hahn 04/13/2015, 7:22 AM

## 2015-04-23 DIAGNOSIS — L259 Unspecified contact dermatitis, unspecified cause: Secondary | ICD-10-CM | POA: Diagnosis not present

## 2015-04-23 DIAGNOSIS — L304 Erythema intertrigo: Secondary | ICD-10-CM | POA: Diagnosis not present

## 2015-04-25 DIAGNOSIS — H25011 Cortical age-related cataract, right eye: Secondary | ICD-10-CM | POA: Diagnosis not present

## 2015-04-25 DIAGNOSIS — H2511 Age-related nuclear cataract, right eye: Secondary | ICD-10-CM | POA: Diagnosis not present

## 2015-05-07 ENCOUNTER — Encounter: Payer: Self-pay | Admitting: Nurse Practitioner

## 2015-05-07 ENCOUNTER — Ambulatory Visit (INDEPENDENT_AMBULATORY_CARE_PROVIDER_SITE_OTHER): Payer: Medicare Other | Admitting: Nurse Practitioner

## 2015-05-07 VITALS — BP 124/66 | HR 72 | Ht 63.0 in | Wt 159.0 lb

## 2015-05-07 DIAGNOSIS — R300 Dysuria: Secondary | ICD-10-CM | POA: Diagnosis not present

## 2015-05-07 LAB — POCT URINALYSIS DIPSTICK
Bilirubin, UA: NEGATIVE
Glucose, UA: NEGATIVE
KETONES UA: NEGATIVE
NITRITE UA: NEGATIVE
PH UA: 6
PROTEIN UA: NEGATIVE
Urobilinogen, UA: NEGATIVE

## 2015-05-07 MED ORDER — FLUCONAZOLE 150 MG PO TABS: 150.0000 mg | ORAL_TABLET | Freq: Once | ORAL | Status: DC

## 2015-05-07 MED ORDER — CIPROFLOXACIN HCL 500 MG PO TABS: 500.0000 mg | ORAL_TABLET | Freq: Two times a day (BID) | ORAL | Status: DC

## 2015-05-07 NOTE — Patient Instructions (Signed)

## 2015-05-07 NOTE — Progress Notes (Signed)
S:  69 y.o.Married white female G0 presents with complaint of UTI.   Symptoms began on  04/30/15 and has gradually worsened with hematuria on Saturday. With symptoms of blood in urine, urinary frequency, urinary urgency.  Pertinent negatives include The patient is having no constitutional symptoms, denying fever, chills, anorexia, or weight loss.  Sexually active yes.  Symptoms related to post coital Yes  She is post menopausal with some vaginal dryness.   Same partner without change.   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  She had to do a prep prior to surgery and had a lot of diarrhea.  After surgery she did well.  Does not recall if she had a catheter for surgery.  After her recheck visit and did very well her husband took her to a nice resort and they were SA for 2 days in a row.  She did take Trimethoprim but that did not help as she still got an infection.  She does not need to wear a bandage over the surgical site but there is a pin pricking piece of suture that is sticking out and is painful.  She is also having problems with constipation despite taking Metamucil.  Denies vaginal discharge.   ROS: no weight loss, fever, night sweats and feels well  O: alert, oriented to person, place, and time, normal mood, behavior, speech, dress, motor activity, and thought processes   healthy,  alert and  not in acute distress  Abdomen: mild tenderness over the suprapubic, surgical site on right abdomen does reveal a small piece of suture that is sharp.  After cleansing the area the suture would not pull out with gently pressure so the top of it was cut off.  There is no redness or signs of infection.  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 Follow up if symptoms not improving, and as needed.   Medication Therapy:( with several medication allergies) - she was given Cipro 500 mg BID    Lab: awaiting C&S, and micro   Have advised her to try Miralax 1 cap full daily to help with constipation  RV

## 2015-05-08 LAB — URINALYSIS, MICROSCOPIC ONLY
CASTS: NONE SEEN [LPF]
Crystals: NONE SEEN [HPF]
Squamous Epithelial / LPF: NONE SEEN [HPF] (ref <=5)
WBC, UA: >60 WBC/HPF — AB (ref <=5)
YEAST: NONE SEEN [HPF]

## 2015-05-09 ENCOUNTER — Telehealth: Payer: Self-pay | Admitting: *Deleted

## 2015-05-09 DIAGNOSIS — H25011 Cortical age-related cataract, right eye: Secondary | ICD-10-CM | POA: Diagnosis not present

## 2015-05-09 DIAGNOSIS — H2511 Age-related nuclear cataract, right eye: Secondary | ICD-10-CM | POA: Diagnosis not present

## 2015-05-09 LAB — URINE CULTURE

## 2015-05-09 NOTE — Progress Notes (Signed)
Encounter reviewed by Dr. Brook Amundson C. Silva.  

## 2015-05-09 NOTE — Telephone Encounter (Signed)
-----   Message from Regina Eck, CNM sent at 05/09/2015  3:00 PM EDT ----- Notify patient that her culture was positive for E. Coli and she is on appropriate medication.Complete medication if symptoms persist needs to advise. Micro urine was also positive for WBC and bacteria Patient status

## 2015-05-09 NOTE — Telephone Encounter (Signed)
I have attempted to contact this patient by phone with the following results: left message to return call to [place] at [phone] answering machine (home per [facility]).  No personal information given.  [phone] (Home) *Preferred*

## 2015-05-10 NOTE — Telephone Encounter (Signed)
Pt notified in result note.  Closing encounter. 

## 2015-05-17 DIAGNOSIS — Z23 Encounter for immunization: Secondary | ICD-10-CM | POA: Diagnosis not present

## 2015-05-21 DIAGNOSIS — H2512 Age-related nuclear cataract, left eye: Secondary | ICD-10-CM | POA: Diagnosis not present

## 2015-05-21 DIAGNOSIS — H25012 Cortical age-related cataract, left eye: Secondary | ICD-10-CM | POA: Diagnosis not present

## 2015-05-23 ENCOUNTER — Telehealth: Payer: Self-pay | Admitting: Family Medicine

## 2015-05-23 NOTE — Telephone Encounter (Signed)
Pt is having cataract surgery on oct 5-16 by dr Gershon Crane and he suggested she take something the night before to help her relax. [facility] [address]

## 2015-05-24 NOTE — Telephone Encounter (Signed)
Patient informed of the message below.

## 2015-05-24 NOTE — Telephone Encounter (Signed)
Left a message for the pt to return my call.  

## 2015-05-24 NOTE — Telephone Encounter (Signed)
Advise her opthomologist rx this if it is felt it is needed. Thanks.

## 2015-05-25 ENCOUNTER — Ambulatory Visit (INDEPENDENT_AMBULATORY_CARE_PROVIDER_SITE_OTHER): Payer: Medicare Other | Admitting: Family Medicine

## 2015-05-25 ENCOUNTER — Encounter: Payer: Self-pay | Admitting: Family Medicine

## 2015-05-25 VITALS — BP 136/80 | HR 92 | Temp 97.5°F | Ht 63.0 in | Wt 158.6 lb

## 2015-05-25 DIAGNOSIS — K219 Gastro-esophageal reflux disease without esophagitis: Secondary | ICD-10-CM

## 2015-05-25 DIAGNOSIS — L255 Unspecified contact dermatitis due to plants, except food: Secondary | ICD-10-CM | POA: Diagnosis not present

## 2015-05-25 MED ORDER — PREDNISONE 10 MG PO TABS: ORAL_TABLET | ORAL | Status: DC

## 2015-05-25 NOTE — Progress Notes (Signed)
HPI:  Acute visit for:  "? Poison Ivy" -itchy spreading vesiculopapular rash on arms, trunk and neck that started after pulling weeds out of property -denies: SOB, lesions on mucus membranes, fevers, chill, malaise  GERD: -prior to last two surgeries had belching and reflux more then usual the night prior to surgery -feels is related to anxiety, though doesn't feel anxious during or prior to these episodes -wonders if she should take an anxiety medication for this -denies: GAD, panic, SOB, CP, vomiting, worsening gerd otherwise   ROS: See pertinent positives and negatives per HPI.  Past Medical History  Diagnosis Date  . GERD (gastroesophageal reflux disease)     hx hiatal hernia, hx esophageal stricture s/p dilation  . Hyperlipidemia   . Diverticulitis 11/2004  . Endometriosis 1994  . Vertigo     associated with headache, intermittent, chronic  . Hx of hepatitis     with cirrhosis - per her report from [place] and eval in 2013 at [facility]  . Shingles   . Arthritis   . Right knee pain   . Left ear pain   . Numbness and tingling     feet bilat has had for 40 years  . Cataracts, bilateral   . Headache   . Migraines     pt states vesticular migraines has dizziness in relation   . Bronchitis     hx of when smoked   . History of chicken pox   . Hepatitis   . History of measles   . Scarlet fever   . History of mumps as a child   . Urinary tract bacterial infections     hx of  . History of hiatal hernia   . Cancer 04/16/09    kidney cancer - tx by [facility] per her report released from f/u    Past Surgical History  Procedure Laterality Date  . Oophorectomy    . Tonsillectomy    . Nephrectomy  04/16/09    Partial removal due to cancer  . Lung biopsy  1996    Negative  . Abdominal hysterectomy  1994    TAH/BSO due to endometriosis  . Tonsillectomy    . Diagnostic laparoscopy    . Laparoscopic partial colectomy N/A 04/10/2015    Procedure: LAPAROSCOPIC  PARTIAL CECTOMY;  Surgeon: Ralene Ok, MD;  Location: [facility];  Service: General;  Laterality: N/A;    Family History  Problem Relation Age of Onset  . Cancer Father   . Colon cancer Neg Hx   . Thyroid cancer Mother   . Cancer Mother     thyroid/ renal cell ca     Social History   Social History  . Marital Status: Married    Spouse Name: N/A  . Number of Children: 0  . Years of Education: N/A   Occupational History  . Retired    Social History Main Topics  . Smoking status: Former Smoker -- 1.50 packs/day for 20 years    Types: Cigarettes    Quit date: 08/25/1990  . Smokeless tobacco: Never Used  . Alcohol Use: No     Comment: Rare  . Drug Use: No  . Sexual Activity:    Partners: Male    Birth Control/ Protection: Surgical     Comment: TAH   Other Topics Concern  . None   Social History Narrative     Current outpatient prescriptions:  .  calcium-vitamin D (OSCAL WITH D 500-200) 500-200 MG-UNIT per tablet, Take 1  tablet by mouth daily.  , Disp: , Rfl:  .  ciprofloxacin (CIPRO) 500 MG tablet, Take 1 tablet (500 mg total) by mouth 2 (two) times daily., Disp: 14 tablet, Rfl: 0 .  Estradiol (VAGIFEM) 10 MCG TABS vaginal tablet, USE TWICE WEEKLY AS DIRECTED (Patient taking differently: USE TWICE WEEKLY AS DIRECTED on Tuesday and Saturday), Disp: 24 tablet, Rfl: 3 .  estradiol (VIVELLE-DOT) 0.025 MG/24HR, PLACE 1 PATCH ONTO THE SKIN 2 (TWO) TIMES A WEEK. (Patient taking differently: PLACE 1 PATCH ONTO THE SKIN 2 (TWO) TIMES A WEEK. Wednesday and Sunday), Disp: 24 patch, Rfl: 2 .  fluconazole (DIFLUCAN) 150 MG tablet, Take 1 tablet (150 mg total) by mouth once. Take one tablet.  Repeat in 48 hours if symptoms are not completely resolved., Disp: 2 tablet, Rfl: 0 .  glucosamine-chondroitin 500-400 MG tablet, Take 2 tablets by mouth daily., Disp: , Rfl:  .  HYDROcodone-acetaminophen (NORCO/VICODIN) 5-325 MG per tablet, Take 1-2 tablets by mouth every 4 (four) hours as  needed for moderate pain., Disp: 30 tablet, Rfl: 0 .  ibuprofen (ADVIL,MOTRIN) 600 MG tablet, Take 1 tablet (600 mg total) by mouth every 8 (eight) hours as needed. (Patient taking differently: Take 600 mg by mouth every 8 (eight) hours as needed for moderate pain. ), Disp: 30 tablet, Rfl: 0 .  Multiple Vitamin (MULTIVITAMIN) tablet, Take 1 tablet by mouth daily.  , Disp: , Rfl:  .  NONFORMULARY OR COMPOUNDED ITEM, Triamcinolone 0.1 % and Silvadene Cream 3:1 ratio, use BID 60 gm (Patient taking differently: Apply 1 application topically as needed (irritation). Triamcinolone 0.1 % and Silvadene Cream 3:1 ratio, use BID 60 gm), Disp: 1 each, Rfl: 3 .  omeprazole (PRILOSEC) 40 MG capsule, Take 40 mg by mouth daily. , Disp: , Rfl:  .  oxyCODONE-acetaminophen (ROXICET) 5-325 MG per tablet, Take 1-2 tablets by mouth every 4 (four) hours as needed., Disp: 30 tablet, Rfl: 0 .  PATADAY 0.2 % SOLN, Place 1 drop into both eyes daily as needed (allergies). , Disp: , Rfl:  .  Probiotic Product (PROBIOTIC DAILY PO), Take 2 tablets by mouth 2 (two) times daily., Disp: , Rfl:  .  propranolol (INDERAL) 40 MG tablet, Take 40 mg by mouth 2 (two) times daily., Disp: , Rfl: 3 .  triamcinolone cream (KENALOG) 0.5 %, Apply 1 application topically 2 (two) times daily. To affected areas., Disp: 30 g, Rfl: 3 .  trimethoprim (TRIMPEX) 100 MG tablet, Take 100 mg by mouth daily as needed (prevent yeast infections). , Disp: , Rfl:  .  predniSONE (DELTASONE) 10 MG tablet, 5 tablets (50mg ) daily for 3 days, then 4 tablets (40 mg) daily for 3 days, then 3 tablets (30mg ) daily for 3 days, then 2 tablets (20 mg) daily for 3 days, then 1 tablet (10 mg) daily for 3 days., Disp: 45 tablet, Rfl: 0  EXAM:  Filed Vitals:   05/25/15 1421  BP: 136/80  Pulse: 92  Temp: 97.5 F (36.4 C)    Body mass index is 28.1 kg/(m^2).  GENERAL: vitals reviewed and listed above, alert, oriented, appears well hydrated and in no acute  distress  HEENT: atraumatic, conjunttiva clear, no obvious abnormalities on inspection of external nose and ears  NECK: no obvious masses on inspection  LUNGS: clear to auscultation bilaterally, no wheezes, rales or rhonchi, good air movement  CV: HRRR, no peripheral edema  MS: moves all extremities without noticeable abnormality  SKIN: linear patches of papulovesicular lesions on arms,  trunk and neck  PSYCH: pleasant and cooperative, no obvious depression or anxiety  ASSESSMENT AND PLAN:  Discussed the following assessment and plan:  Toxicodendron dermatitis -discussed risks/benefits various tx and opted for steroid taper given widespread lesions -return and f/u precautions  Gastroesophageal reflux disease, esophagitis presence not specified -advised of OTC options for rare flares, advised against benzo at this time  -Patient advised to return or notify a doctor immediately if symptoms worsen or persist or new concerns arise.  Patient Instructions  Poison [facility] ivy is a inflammation of the skin (contact dermatitis) caused by touching the allergens on the leaves of the ivy plant following previous exposure to the plant. The rash usually appears 48 hours after exposure. The rash is usually bumps (papules) or blisters (vesicles) in a linear pattern. Depending on your own sensitivity, the rash may simply cause redness and itching, or it may also progress to blisters which may break open. These must be well cared for to prevent secondary bacterial (germ) infection, followed by scarring. Keep any open areas dry, clean, dressed, and covered with an antibacterial ointment if needed. The eyes may also get puffy. The puffiness is worst in the morning and gets better as the day progresses. This dermatitis usually heals without scarring, within 2 to 3 weeks without treatment. HOME CARE INSTRUCTIONS  Thoroughly wash with soap and water as soon as you have been exposed to poison ivy. You  have about one half hour to remove the plant resin before it will cause the rash. This washing will destroy the oil or antigen on the skin that is causing, or will cause, the rash. Be sure to wash under your fingernails as any plant resin there will continue to spread the rash. Do not rub skin vigorously when washing affected area. Poison ivy cannot spread if no oil from the plant remains on your body. A rash that has progressed to weeping sores will not spread the rash unless you have not washed thoroughly. It is also important to wash any clothes you have been wearing as these may carry active allergens. The rash will return if you wear the unwashed clothing, even several days later. Avoidance of the plant in the future is the best measure. Poison ivy plant can be recognized by the number of leaves. Generally, poison ivy has three leaves with flowering branches on a single stem. Diphenhydramine may be purchased over the counter and used as needed for itching. Do not drive with this medication if it makes you drowsy.Ask your caregiver about medication for children. SEEK MEDICAL CARE IF:  Open sores develop.  Redness spreads beyond area of rash.  You notice purulent (pus-like) discharge.  You have increased pain.  Other signs of infection develop (such as fever). Document Released: 08/08/2000 Document Revised: 11/03/2011 Document Reviewed: 01/19/2009 [facility] Patient Information 2015 [place], [place]. This information is not intended to replace advice given to you by your health care provider. Make sure you discuss any questions you have with your health care provider.      Colin Benton R.

## 2015-05-25 NOTE — Progress Notes (Signed)
Pre visit review using our clinic review tool, if applicable. No additional management support is needed unless otherwise documented below in the visit note. 

## 2015-05-25 NOTE — Patient Instructions (Signed)

## 2015-06-08 ENCOUNTER — Other Ambulatory Visit: Payer: Self-pay | Admitting: *Deleted

## 2015-06-08 ENCOUNTER — Telehealth: Payer: Self-pay | Admitting: Neurology

## 2015-06-08 MED ORDER — PROPRANOLOL HCL 40 MG PO TABS: 40.0000 mg | ORAL_TABLET | Freq: Two times a day (BID) | ORAL | Status: DC

## 2015-06-08 NOTE — Telephone Encounter (Signed)
Rx sent 

## 2015-06-08 NOTE — Telephone Encounter (Signed)
Pt called requesting a refill for/ Pranolal//call back @ [phone]

## 2015-06-13 DIAGNOSIS — H2512 Age-related nuclear cataract, left eye: Secondary | ICD-10-CM | POA: Diagnosis not present

## 2015-06-13 DIAGNOSIS — H25012 Cortical age-related cataract, left eye: Secondary | ICD-10-CM | POA: Diagnosis not present

## 2015-06-22 ENCOUNTER — Ambulatory Visit (INDEPENDENT_AMBULATORY_CARE_PROVIDER_SITE_OTHER): Payer: Medicare Other | Admitting: Nurse Practitioner

## 2015-06-22 ENCOUNTER — Encounter: Payer: Self-pay | Admitting: Nurse Practitioner

## 2015-06-22 VITALS — BP 130/70 | HR 66 | Resp 12 | Ht 63.0 in | Wt 163.0 lb

## 2015-06-22 DIAGNOSIS — Z0000 Encounter for general adult medical examination without abnormal findings: Secondary | ICD-10-CM | POA: Diagnosis not present

## 2015-06-22 DIAGNOSIS — N952 Postmenopausal atrophic vaginitis: Secondary | ICD-10-CM

## 2015-06-22 DIAGNOSIS — Z124 Encounter for screening for malignant neoplasm of cervix: Secondary | ICD-10-CM

## 2015-06-22 DIAGNOSIS — Z01419 Encounter for gynecological examination (general) (routine) without abnormal findings: Secondary | ICD-10-CM

## 2015-06-22 DIAGNOSIS — Z79890 Hormone replacement therapy (postmenopausal): Secondary | ICD-10-CM | POA: Diagnosis not present

## 2015-06-22 DIAGNOSIS — E2839 Other primary ovarian failure: Secondary | ICD-10-CM

## 2015-06-22 LAB — POCT URINALYSIS DIPSTICK
Bilirubin, UA: NEGATIVE
Blood, UA: NEGATIVE
Glucose, UA: NEGATIVE
Ketones, UA: NEGATIVE
LEUKOCYTES UA: NEGATIVE
Nitrite, UA: NEGATIVE
PH UA: 6
PROTEIN UA: NEGATIVE
UROBILINOGEN UA: NEGATIVE

## 2015-06-22 MED ORDER — ESTRADIOL 0.025 MG/24HR TD PTTW: MEDICATED_PATCH | TRANSDERMAL | Status: DC

## 2015-06-22 MED ORDER — ESTRADIOL 10 MCG VA TABS: ORAL_TABLET | VAGINAL | Status: DC

## 2015-06-22 MED ORDER — TRIMETHOPRIM 100 MG PO TABS: 100.0000 mg | ORAL_TABLET | Freq: Every day | ORAL | Status: DC | PRN

## 2015-06-22 NOTE — Patient Instructions (Signed)

## 2015-06-22 NOTE — Progress Notes (Signed)
69 y.o. G0P0 Married  Caucasian Fe here for annual exam.   Doing well since partial colectomy secondary to precancer cecal polyp 04/10/15.   Patient's last menstrual period was 08/25/1992.          Sexually active: Yes.    The current method of family planning is status post hysterectomy.    Exercising: No.  The patient does not participate in regular exercise at present. Smoker:  no  Health Maintenance:  Pap:  04/2008 Normal  MMG:  06/20/14 BIRADS1:Neg Colonoscopy:  03/07/15 precancerous polyps. Repeat 3 years  BMD:   11/22/09 Normal  TDaP:  2012 spine 0.6; left hip neck 0.5 Labs: PCP UA:   reports that she quit smoking about 24 years ago. Her smoking use included Cigarettes. She has a 30 pack-year smoking history. She has never used smokeless tobacco. She reports that she does not drink alcohol or use illicit drugs.  Past Medical History  Diagnosis Date  . GERD (gastroesophageal reflux disease)     hx hiatal hernia, hx esophageal stricture s/p dilation  . Hyperlipidemia   . Diverticulitis 11/2004  . Endometriosis 1994  . Vertigo     associated with headache, intermittent, chronic  . Hx of hepatitis     with cirrhosis - per her report from [place] and eval in 2013 at [place]  . Shingles   . Arthritis   . Right knee pain   . Left ear pain   . Numbness and tingling     feet bilat has had for 40 years  . Cataracts, bilateral   . Headache   . Migraines     pt states vesticular migraines has dizziness in relation   . Bronchitis     hx of when smoked   . History of chicken pox   . Hepatitis   . History of measles   . Scarlet fever   . History of mumps as a child   . Urinary tract bacterial infections     hx of  . History of hiatal hernia   . Cancer ([place]) 04/16/09    kidney cancer - tx by [facility] per her report released from f/u    Past Surgical History  Procedure Laterality Date  . Oophorectomy    . Tonsillectomy    . Nephrectomy  04/16/09    Partial removal  due to cancer  . Lung biopsy  1996    Negative  . Abdominal hysterectomy  1994    TAH/BSO due to endometriosis  . Tonsillectomy    . Diagnostic laparoscopy    . Laparoscopic partial colectomy N/A 04/10/2015    Procedure: LAPAROSCOPIC PARTIAL CECTOMY;  Surgeon: Ralene Ok, MD;  Location: [facility];  Service: General;  Laterality: N/A;  . Cataract extraction w/ intraocular lens implant Bilateral 05/2015, 04/2015    Current Outpatient Prescriptions  Medication Sig Dispense Refill  . Estradiol (VAGIFEM) 10 MCG TABS vaginal tablet USE TWICE WEEKLY AS DIRECTED 24 tablet 3  . estradiol (VIVELLE-DOT) 0.025 MG/24HR PLACE 1 PATCH ONTO THE SKIN 2 (TWO) TIMES A WEEK. 24 patch 3  . glucosamine-chondroitin 500-400 MG tablet Take 2 tablets by mouth daily.    . Multiple Vitamin (MULTIVITAMIN) tablet Take 1 tablet by mouth daily.      . NONFORMULARY OR COMPOUNDED ITEM Triamcinolone 0.1 % and Silvadene Cream 3:1 ratio, use BID 60 gm (Patient taking differently: Apply 1 application topically as needed (irritation). Triamcinolone 0.1 % and Silvadene Cream 3:1 ratio, use BID 60 gm) 1  each 3  . ofloxacin (OCUFLOX) 0.3 % ophthalmic solution     . omeprazole (PRILOSEC) 40 MG capsule Take 40 mg by mouth daily.     Marland Kitchen PATADAY 0.2 % SOLN Place 1 drop into both eyes daily as needed (allergies).     . Probiotic Product (PROBIOTIC DAILY PO) Take 2 tablets by mouth 2 (two) times daily.    Marland Kitchen PROLENSA 0.07 % SOLN     . propranolol (INDERAL) 40 MG tablet Take 1 tablet (40 mg total) by mouth 2 (two) times daily. 60 tablet 3  . RESTASIS 0.05 % ophthalmic emulsion     . triamcinolone cream (KENALOG) 0.5 % Apply 1 application topically 2 (two) times daily. To affected areas. 30 g 3  . trimethoprim (TRIMPEX) 100 MG tablet Take 1 tablet (100 mg total) by mouth daily as needed (prevent yeast infections). 30 tablet 5   No current facility-administered medications for this visit.    Family History  Problem Relation Age of  Onset  . Cancer Father   . Colon cancer Neg Hx   . Thyroid cancer Mother   . Cancer Mother     thyroid/ renal cell ca     ROS:  Pertinent items are noted in HPI.  Otherwise, a comprehensive ROS was negative.  Exam:   BP 130/70 mmHg  Pulse 66  Resp 12  Ht 5\' 3"  (1.6 m)  Wt 163 lb (73.936 kg)  BMI 28.88 kg/m2  LMP 08/25/1992 Height: 5\' 3"  (160 cm) Ht Readings from Last 3 Encounters:  06/22/15 5\' 3"  (1.6 m)  05/25/15 5\' 3"  (1.6 m)  05/07/15 5\' 3"  (1.6 m)    General appearance: alert, cooperative and appears stated age Head: Normocephalic, without obvious abnormality, atraumatic Neck: no adenopathy, supple, symmetrical, trachea midline and thyroid normal to inspection and palpation Lungs: clear to auscultation bilaterally Breasts: normal appearance, no masses or tenderness Heart: regular rate and rhythm Abdomen: soft, non-tender; no masses,  no organomegaly Extremities: extremities normal, atraumatic, no cyanosis or edema Skin: Skin color, texture, turgor normal. No rashes or lesions Lymph nodes: Cervical, supraclavicular, and axillary nodes normal. No abnormal inguinal nodes palpated Neurologic: Grossly normal   Pelvic: External genitalia:  no lesions              Urethra:  normal appearing urethra with no masses, tenderness or lesions              Bartholin's and Skene's: normal                 Vagina: normal appearing vagina with normal color and discharge, no lesions              Cervix: absent              Pap taken: No. Bimanual Exam:  Uterus:  uterus absent              Adnexa: no mass, fullness, tenderness               Rectovaginal: Confirms               Anus:  normal sphincter tone, no lesions  Chaperone present: no  A:  Well Woman with normal exam  S/P TAH/ BSO 1994 on HRT since S/P right partial nephrectomy secondary to cancer 03/2009  Chronic vulvitis  Atrophic vaginitis - better on Vagifem  Post  coital UTI  Vit D deficiency  S/P partial Cectomy 04/10/15 for precancer polyp  History of  hepatitis/ cirrhosis 2013   P:   Reviewed health and wellness pertinent to exam  Pap smear as above  Mammogram is due 11/16  Refill on Vagifem for a year  Counseled on risk of CVA, DVT, cancer  She is advised to see GI about further evaluation of Hepatitis/ cirrhosis  Counseled on breast self exam, mammography screening, adequate intake of calcium and vitamin D, diet and exercise, Kegel's exercises return annually or prn  An After Visit Summary was printed and given to the patient.

## 2015-06-24 NOTE — Progress Notes (Signed)
Encounter reviewed by Dr. Kynzlie Hilleary Amundson C. Silva.  

## 2015-08-13 ENCOUNTER — Other Ambulatory Visit: Payer: Self-pay

## 2015-08-13 MED ORDER — PROPRANOLOL HCL 40 MG PO TABS: 40.0000 mg | ORAL_TABLET | Freq: Two times a day (BID) | ORAL | Status: DC

## 2015-08-13 NOTE — Telephone Encounter (Signed)
Last OV: 12/09/14  Next OV: 0/0/00

## 2015-11-13 ENCOUNTER — Telehealth: Payer: Self-pay | Admitting: Neurology

## 2015-11-13 MED ORDER — PROPRANOLOL HCL 40 MG PO TABS: 40.0000 mg | ORAL_TABLET | Freq: Two times a day (BID) | ORAL | Status: DC

## 2015-11-13 NOTE — Telephone Encounter (Signed)
Last OV: 12/22/14 Next OV: 11/23/15

## 2015-11-13 NOTE — Telephone Encounter (Signed)
Pt needs a refill on her medication propranolol pt cb# [phone]

## 2015-11-23 ENCOUNTER — Ambulatory Visit (INDEPENDENT_AMBULATORY_CARE_PROVIDER_SITE_OTHER): Payer: Medicare Other | Admitting: Neurology

## 2015-11-23 ENCOUNTER — Encounter: Payer: Self-pay | Admitting: Neurology

## 2015-11-23 VITALS — BP 132/70 | HR 68 | Ht 63.0 in | Wt 165.8 lb

## 2015-11-23 DIAGNOSIS — R42 Dizziness and giddiness: Secondary | ICD-10-CM

## 2015-11-23 NOTE — Patient Instructions (Signed)
1.  Continue propranolol 40mg  twice daily 2.  If you have another episode of vertigo, you can contact us and we can try a prednisone (steroid) taper to try and break it. 3.  Follow up in one year but call sooner with any issues

## 2015-11-23 NOTE — Progress Notes (Signed)
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    UPDATE: To see if these recurrent vertigo spells were possibly migraine, she was started on a migraine preventative.  She is taking propranolol 40mg  twice daily.  She reports some improvement.  She has had 3 spells over the past 11 months.  They range from 1 to 6 days.  The headache was more mild and dizziness was less intense.  HISTORY: She began experiencing the vertigo when she was 70 years old.  They are episodic, occuring about 4 times a year.  It usually occurs when there is change in weather.  She describes it as continuous spinning sensation usually lasting 2 to 7 days.  It is accompanied by an intense bi-frontal 7/10 pressure-like pain.  She usually takes Advil or Aleve for the pain, which is ineffective.  She is limited to what she can take due to the hepatitis.  She reports associated phonophobia.  There is no associated nausea, tinnitus, hearing loss, aural fullness, visual disturbance, photophobia or focal numbness or weakness.  It is worse when she is laying down and more tolerable when she is sitting up.  It is not exacerbated or triggered by quick movements of the head.  She cannot walk straight.  Her head tends to tilt to the right.  It is debilitating and she is housebound during a spell.  She has taken meclizine which causes extreme fatigue.  In between episodes, she is fine, although she says that her left leg will sometimes cross over her right leg when she walks.    Her last episode was in March.  It was more intense and lasted longer, about 2 weeks.  However, she had a sinus infection at the time, which may have contributed to it.  She reports no personal history of  headache or migraine.  She denies family history of migraine.  PAST MEDICAL HISTORY: Past Medical History  Diagnosis Date  . GERD (gastroesophageal reflux disease)     hx hiatal hernia, hx esophageal stricture s/p dilation  . Hyperlipidemia   . Diverticulitis 11/2004  . Endometriosis 1994  . Vertigo     associated with headache, intermittent, chronic  . Hx of hepatitis     with cirrhosis - per her report from [facility] and eval in 2013 at [place]  . Shingles   . Arthritis   . Right knee pain   . Left ear pain   . Numbness and tingling     feet bilat has had for 40 years  . Cataracts, bilateral   . Headache   . Migraines     pt states vesticular migraines has dizziness in relation   . Bronchitis     hx of when smoked   . History of chicken pox   . Hepatitis   . History of measles   . Scarlet fever   . History of mumps as a child   . Urinary tract bacterial infections     hx of  . History of hiatal hernia   . Cancer ([place]) 04/16/09    kidney cancer - tx by [facility] per her report released from f/u    MEDICATIONS: Current Outpatient Prescriptions on File Prior to Visit  Medication Sig Dispense Refill  .  Estradiol (VAGIFEM) 10 MCG TABS vaginal tablet USE TWICE WEEKLY AS DIRECTED 24 tablet 3  . estradiol (VIVELLE-DOT) 0.025 MG/24HR PLACE 1 PATCH ONTO THE SKIN 2 (TWO) TIMES A WEEK. 24 patch 3  . glucosamine-chondroitin 500-400 MG tablet Take 2 tablets by mouth daily.    . Multiple Vitamin (MULTIVITAMIN) tablet Take 1 tablet by mouth daily.      Marland Kitchen ofloxacin (OCUFLOX) 0.3 % ophthalmic solution     . omeprazole (PRILOSEC) 40 MG capsule Take 40 mg by mouth daily.     Marland Kitchen PATADAY 0.2 % SOLN Place 1 drop into both eyes daily as needed (allergies).     . Probiotic Product (PROBIOTIC DAILY PO) Take 2 tablets by mouth 2 (two) times daily.    . propranolol (INDERAL) 40 MG tablet Take 1 tablet (40 mg total) by mouth 2 (two) times daily. 180 tablet 0   No current  facility-administered medications on file prior to visit.    ALLERGIES: Allergies  Allergen Reactions  . Codeine     REACTION: nausea---CNS side effects  . [employer] [employer] [Attends Briefs Large]   . Nitrofurantoin     REACTION: sick--? hepatitis  . Other     Metal staples caused swelling and infection   . Septra [Sulfamethoxazole-Trimethoprim]     FAMILY HISTORY: Family History  Problem Relation Age of Onset  . Cancer Father   . Colon cancer Neg Hx   . Thyroid cancer Mother   . Cancer Mother     thyroid/ renal cell ca     SOCIAL HISTORY: Social History   Social History  . Marital Status: Married    Spouse Name: N/A  . Number of Children: 0  . Years of Education: N/A   Occupational History  . Retired    Social History Main Topics  . Smoking status: Former Smoker -- 1.50 packs/day for 20 years    Types: Cigarettes    Quit date: 08/25/1990  . Smokeless tobacco: Never Used  . Alcohol Use: No     Comment: Rare  . Drug Use: No  . Sexual Activity:    Partners: Male    Birth Control/ Protection: Surgical, Post-menopausal     Comment: TAH   Other Topics Concern  . Not on file   Social History Narrative    REVIEW OF SYSTEMS: Constitutional: No fevers, chills, or sweats, no generalized fatigue, change in appetite Eyes: No visual changes, double vision, eye pain Ear, nose and throat: No hearing loss, ear pain, nasal congestion, sore throat Cardiovascular: No chest pain, palpitations Respiratory:  No shortness of breath at rest or with exertion, wheezes GastrointestinaI: No nausea, vomiting, diarrhea, abdominal pain, fecal incontinence Genitourinary:  No dysuria, urinary retention or frequency Musculoskeletal:  No neck pain, back pain Integumentary: No rash, pruritus, skin lesions Neurological: as above Psychiatric: No depression, insomnia, anxiety Endocrine: No palpitations, fatigue, diaphoresis, mood swings, change in appetite, change in weight,  increased thirst Hematologic/Lymphatic:  No anemia, purpura, petechiae. Allergic/Immunologic: no itchy/runny eyes, nasal congestion, recent allergic reactions, rashes  PHYSICAL EXAM: Filed Vitals:   11/23/15 0927  BP: 132/70  Pulse: 68   General: No acute distress.  Patient appears well-groomed.   Head:  Normocephalic/atraumatic Eyes:  Fundoscopic exam unremarkable without vessel changes, exudates, hemorrhages or papilledema. Neck: supple, no paraspinal tenderness, full range of motion Heart:  Regular rate and rhythm Lungs:  Clear to auscultation bilaterally Back: No paraspinal tenderness Neurological Exam: alert and oriented to person, place, and time. Attention span  and concentration intact, recent and remote memory intact, fund of knowledge intact.  Speech fluent and not dysarthric, language intact.  CN II-XII intact. Fundoscopic exam unremarkable without vessel changes, exudates, hemorrhages or papilledema.  Bulk and tone normal, muscle strength 5/5 throughout.  Sensation to light touch, temperature and vibration intact.  Deep tendon reflexes 1+ throughout.  Finger to nose and heel to shin testing intact.  Walks with slight limp. Romberg negative.  IMPRESSION: Recurrent vertigo.  Etiology unclear but possibly migraine-related  PLAN: Continue propranolol 40mg  twice daily Consider prednisone taper for acute attack Follow up in one year or as needed.  15 minutes spent face to face with patient, over 50% spent discussing management and diagnosis.  Metta Clines, DO  CC:  Colin Benton, DO

## 2015-12-06 DIAGNOSIS — M1711 Unilateral primary osteoarthritis, right knee: Secondary | ICD-10-CM | POA: Diagnosis not present

## 2015-12-06 DIAGNOSIS — M25561 Pain in right knee: Secondary | ICD-10-CM | POA: Diagnosis not present

## 2015-12-21 DIAGNOSIS — M25561 Pain in right knee: Secondary | ICD-10-CM | POA: Diagnosis not present

## 2015-12-21 DIAGNOSIS — G8929 Other chronic pain: Secondary | ICD-10-CM | POA: Diagnosis not present

## 2015-12-26 DIAGNOSIS — M25561 Pain in right knee: Secondary | ICD-10-CM | POA: Diagnosis not present

## 2015-12-26 DIAGNOSIS — G8929 Other chronic pain: Secondary | ICD-10-CM | POA: Diagnosis not present

## 2015-12-28 DIAGNOSIS — M25561 Pain in right knee: Secondary | ICD-10-CM | POA: Diagnosis not present

## 2015-12-28 DIAGNOSIS — G8929 Other chronic pain: Secondary | ICD-10-CM | POA: Diagnosis not present

## 2016-01-01 DIAGNOSIS — Z85828 Personal history of other malignant neoplasm of skin: Secondary | ICD-10-CM | POA: Diagnosis not present

## 2016-01-01 DIAGNOSIS — Z08 Encounter for follow-up examination after completed treatment for malignant neoplasm: Secondary | ICD-10-CM | POA: Diagnosis not present

## 2016-01-01 DIAGNOSIS — L509 Urticaria, unspecified: Secondary | ICD-10-CM | POA: Diagnosis not present

## 2016-01-07 DIAGNOSIS — M25561 Pain in right knee: Secondary | ICD-10-CM | POA: Diagnosis not present

## 2016-01-07 DIAGNOSIS — G8929 Other chronic pain: Secondary | ICD-10-CM | POA: Diagnosis not present

## 2016-01-11 DIAGNOSIS — M25561 Pain in right knee: Secondary | ICD-10-CM | POA: Diagnosis not present

## 2016-01-11 DIAGNOSIS — G8929 Other chronic pain: Secondary | ICD-10-CM | POA: Diagnosis not present

## 2016-01-14 DIAGNOSIS — M25561 Pain in right knee: Secondary | ICD-10-CM | POA: Diagnosis not present

## 2016-01-14 DIAGNOSIS — G8929 Other chronic pain: Secondary | ICD-10-CM | POA: Diagnosis not present

## 2016-01-16 DIAGNOSIS — L509 Urticaria, unspecified: Secondary | ICD-10-CM | POA: Diagnosis not present

## 2016-01-17 DIAGNOSIS — M1711 Unilateral primary osteoarthritis, right knee: Secondary | ICD-10-CM | POA: Diagnosis not present

## 2016-01-17 DIAGNOSIS — M25561 Pain in right knee: Secondary | ICD-10-CM | POA: Diagnosis not present

## 2016-01-18 DIAGNOSIS — M25561 Pain in right knee: Secondary | ICD-10-CM | POA: Diagnosis not present

## 2016-01-18 DIAGNOSIS — G8929 Other chronic pain: Secondary | ICD-10-CM | POA: Diagnosis not present

## 2016-01-25 DIAGNOSIS — M25561 Pain in right knee: Secondary | ICD-10-CM | POA: Diagnosis not present

## 2016-01-25 DIAGNOSIS — G8929 Other chronic pain: Secondary | ICD-10-CM | POA: Diagnosis not present

## 2016-02-01 DIAGNOSIS — M25561 Pain in right knee: Secondary | ICD-10-CM | POA: Diagnosis not present

## 2016-02-01 DIAGNOSIS — G8929 Other chronic pain: Secondary | ICD-10-CM | POA: Diagnosis not present

## 2016-02-14 ENCOUNTER — Telehealth: Payer: Self-pay

## 2016-02-14 NOTE — Telephone Encounter (Signed)
Increase it to 60mg  bid

## 2016-02-14 NOTE — Telephone Encounter (Signed)
Pt called in for refill on propranolol. Pt currently on 40 mg BID, pt stated at last visit on 11/23/15 she was advised she could increase that to 60 mg bid. Pt stated at that time she asked to hold off for a few months to see how she did on the 40 mg bid. Pt states she is no better, she is in need of a refill but would like it increased to the 60 mg  Bid. Please advise.

## 2016-02-15 MED ORDER — PROPRANOLOL HCL 60 MG PO TABS: 60.0000 mg | ORAL_TABLET | Freq: Two times a day (BID) | ORAL | Status: DC

## 2016-02-15 NOTE — Telephone Encounter (Signed)
RX sent in as 60 mg BID.

## 2016-02-25 DIAGNOSIS — M25561 Pain in right knee: Secondary | ICD-10-CM | POA: Diagnosis not present

## 2016-02-25 DIAGNOSIS — G8929 Other chronic pain: Secondary | ICD-10-CM | POA: Diagnosis not present

## 2016-03-17 ENCOUNTER — Other Ambulatory Visit: Payer: Self-pay

## 2016-03-17 MED ORDER — PROPRANOLOL HCL 60 MG PO TABS: 60.0000 mg | ORAL_TABLET | Freq: Two times a day (BID) | ORAL | 2 refills | Status: DC

## 2016-03-17 NOTE — Telephone Encounter (Signed)
  Increase it to 60mg  bid

## 2016-05-20 DIAGNOSIS — Z23 Encounter for immunization: Secondary | ICD-10-CM | POA: Diagnosis not present

## 2016-05-23 ENCOUNTER — Other Ambulatory Visit: Payer: Self-pay | Admitting: Gastroenterology

## 2016-05-23 DIAGNOSIS — K219 Gastro-esophageal reflux disease without esophagitis: Secondary | ICD-10-CM | POA: Diagnosis not present

## 2016-05-23 DIAGNOSIS — R945 Abnormal results of liver function studies: Secondary | ICD-10-CM

## 2016-05-23 DIAGNOSIS — Z86010 Personal history of colonic polyps: Secondary | ICD-10-CM | POA: Diagnosis not present

## 2016-05-23 DIAGNOSIS — R1310 Dysphagia, unspecified: Secondary | ICD-10-CM | POA: Diagnosis not present

## 2016-05-23 DIAGNOSIS — IMO0001 Reserved for inherently not codable concepts without codable children: Secondary | ICD-10-CM

## 2016-05-23 DIAGNOSIS — R1110 Vomiting, unspecified: Secondary | ICD-10-CM

## 2016-05-23 DIAGNOSIS — R748 Abnormal levels of other serum enzymes: Secondary | ICD-10-CM | POA: Diagnosis not present

## 2016-05-23 DIAGNOSIS — R7989 Other specified abnormal findings of blood chemistry: Secondary | ICD-10-CM

## 2016-05-23 LAB — HM COLONOSCOPY

## 2016-05-30 ENCOUNTER — Other Ambulatory Visit: Payer: Self-pay | Admitting: Nurse Practitioner

## 2016-05-30 DIAGNOSIS — Z1231 Encounter for screening mammogram for malignant neoplasm of breast: Secondary | ICD-10-CM

## 2016-06-06 ENCOUNTER — Ambulatory Visit
Admission: RE | Admit: 2016-06-06 | Discharge: 2016-06-06 | Disposition: A | Discharge status: Home / Self Care | Payer: BC Managed Care – PPO | Source: Ambulatory Visit | Attending: Gastroenterology | Admitting: Gastroenterology

## 2016-06-06 DIAGNOSIS — R945 Abnormal results of liver function studies: Principal | ICD-10-CM

## 2016-06-06 DIAGNOSIS — R7989 Other specified abnormal findings of blood chemistry: Secondary | ICD-10-CM

## 2016-06-06 DIAGNOSIS — R1310 Dysphagia, unspecified: Secondary | ICD-10-CM

## 2016-06-06 DIAGNOSIS — IMO0001 Reserved for inherently not codable concepts without codable children: Secondary | ICD-10-CM

## 2016-06-06 DIAGNOSIS — K219 Gastro-esophageal reflux disease without esophagitis: Secondary | ICD-10-CM

## 2016-06-06 DIAGNOSIS — K224 Dyskinesia of esophagus: Secondary | ICD-10-CM | POA: Diagnosis not present

## 2016-06-06 DIAGNOSIS — R1110 Vomiting, unspecified: Secondary | ICD-10-CM

## 2016-06-06 DIAGNOSIS — K7460 Unspecified cirrhosis of liver: Secondary | ICD-10-CM | POA: Diagnosis not present

## 2016-06-13 ENCOUNTER — Ambulatory Visit
Admission: RE | Admit: 2016-06-13 | Discharge: 2016-06-13 | Disposition: A | Discharge status: Home / Self Care | Payer: Medicare Other | Source: Ambulatory Visit | Attending: Nurse Practitioner | Admitting: Nurse Practitioner

## 2016-06-13 DIAGNOSIS — Z1231 Encounter for screening mammogram for malignant neoplasm of breast: Secondary | ICD-10-CM

## 2016-06-23 ENCOUNTER — Encounter: Payer: Self-pay | Admitting: Nurse Practitioner

## 2016-06-23 ENCOUNTER — Ambulatory Visit (INDEPENDENT_AMBULATORY_CARE_PROVIDER_SITE_OTHER): Payer: Medicare Other | Admitting: Nurse Practitioner

## 2016-06-23 VITALS — BP 128/78 | HR 60 | Resp 16 | Ht 63.0 in | Wt 161.0 lb

## 2016-06-23 DIAGNOSIS — Z01419 Encounter for gynecological examination (general) (routine) without abnormal findings: Secondary | ICD-10-CM | POA: Diagnosis not present

## 2016-06-23 DIAGNOSIS — N952 Postmenopausal atrophic vaginitis: Secondary | ICD-10-CM | POA: Diagnosis not present

## 2016-06-23 DIAGNOSIS — Z01411 Encounter for gynecological examination (general) (routine) with abnormal findings: Secondary | ICD-10-CM

## 2016-06-23 DIAGNOSIS — N763 Subacute and chronic vulvitis: Secondary | ICD-10-CM

## 2016-06-23 DIAGNOSIS — Z0000 Encounter for general adult medical examination without abnormal findings: Secondary | ICD-10-CM | POA: Diagnosis not present

## 2016-06-23 LAB — POCT URINALYSIS DIPSTICK
BILIRUBIN UA: NEGATIVE
Glucose, UA: NEGATIVE
Ketones, UA: NEGATIVE
LEUKOCYTES UA: NEGATIVE
NITRITE UA: NEGATIVE
PH UA: 6
PROTEIN UA: NEGATIVE
RBC UA: NEGATIVE
UROBILINOGEN UA: NEGATIVE

## 2016-06-23 MED ORDER — CLOBETASOL PROPIONATE 0.05 % EX OINT: 1.0000 "application " | TOPICAL_OINTMENT | Freq: Two times a day (BID) | CUTANEOUS | 0 refills | Status: DC

## 2016-06-23 NOTE — Progress Notes (Signed)
Patient ID: Brenda [name], female   DOB: April 15, 1946, 70 y.o.   MRN: [id number]  70 y.o. [id number] Married  Caucasian Fe here for annual exam.   This pat has weaned down to using Vivelle dot only once a month.  She can feel a difference with her skin in general and does not like this.  Noted an area inside left vulva that is burning at times.  She has been using on OTC lotion that is not healing the area.  She has been bothered with increase in migraine HA's and lasting longer and has associated dizzy spells.  She is on Propanolol 40 mg ? (med list says 60 mg)  twice a day for HA prevention.   She did call and will return to Neurologist in December.  GERD is worse  - now off Prilosec due to increase in leg cramps.  She called GI and is now on Dexilant.  [place] of esophagus with no restriction but had bad reflux.  Patient's last menstrual period was 08/25/1992.          Sexually active: No.  The current method of family planning is status post hysterectomy.    Exercising: No.  The patient does not participate in regular exercise at present. Smoker:  no  Health Maintenance: Pap:  04/2008, Normal  MMG: 06/13/16, Bi-Rads 1: Negative Colonoscopy:  03/07/15 precancerous polyps. Repeat 3 years  BMD:  11/22/09 T Score, 0.6 Spine / 0.5 Left Femur Neck TDaP: 08/21/11 Shingles: 06/26/11 Pneumonia: 08/26/07, 08/20/11 Hep C: 01/18/09 HIV: Not indicated due to age Labs: Another office   Urine today: Negative   reports that she quit smoking about 25 years ago. Her smoking use included Cigarettes. She has a 30.00 pack-year smoking history. She has never used smokeless tobacco. She reports that she does not drink alcohol or use drugs.  Past Medical History:  Diagnosis Date  . Arthritis   . Bronchitis    hx of when smoked   . Cancer ([place]) 04/16/09   kidney cancer - tx by [facility] per her report released from f/u  . Cataracts, bilateral   . Diverticulitis 11/2004  . Endometriosis 1994  . GERD  (gastroesophageal reflux disease)    hx hiatal hernia, hx esophageal stricture s/p dilation  . Headache   . Hepatitis   . History of chicken pox   . History of hiatal hernia   . History of measles   . History of mumps as a child   . Hx of hepatitis    with cirrhosis - per her report from [place] and eval in 2013 at [place]  . Hyperlipidemia   . Left ear pain   . Migraines    pt states vesticular migraines has dizziness in relation   . Numbness and tingling    feet bilat has had for 40 years  . Right knee pain   . Scarlet fever   . Shingles   . Urinary tract bacterial infections    hx of  . Vertigo    associated with headache, intermittent, chronic    Past Surgical History:  Procedure Laterality Date  . ABDOMINAL HYSTERECTOMY  1994   TAH/BSO due to endometriosis  . CATARACT EXTRACTION W/ INTRAOCULAR LENS IMPLANT Bilateral 05/2015, 04/2015  . DIAGNOSTIC LAPAROSCOPY    . LAPAROSCOPIC PARTIAL COLECTOMY N/A 04/10/2015   Procedure: LAPAROSCOPIC PARTIAL CECTOMY;  Surgeon: Ralene Ok, MD;  Location: [facility];  Service: General;  Laterality: N/A;  . [place]  Negative  . NEPHRECTOMY  04/16/09   Partial removal due to cancer  . OOPHORECTOMY    . TONSILLECTOMY    . TONSILLECTOMY      Current Outpatient Prescriptions  Medication Sig Dispense Refill  . cycloSPORINE (RESTASIS) 0.05 % ophthalmic emulsion Apply to eye 2 (two) times daily.    Marland Kitchen DEXILANT 60 MG capsule Take 60 mg by mouth daily.    . Estradiol (VAGIFEM) 10 MCG TABS vaginal tablet USE TWICE WEEKLY AS DIRECTED 24 tablet 3  . estradiol (VIVELLE-DOT) 0.025 MG/24HR PLACE 1 PATCH ONTO THE SKIN 2 (TWO) TIMES A WEEK. 24 patch 3  . glucosamine-chondroitin 500-400 MG tablet Take 2 tablets by mouth daily.    . Multiple Vitamin (MULTIVITAMIN) tablet Take 1 tablet by mouth daily.      Marland Kitchen ofloxacin (OCUFLOX) 0.3 % ophthalmic solution     . PATADAY 0.2 % SOLN Place 1 drop into both eyes daily as needed (allergies).     .  Probiotic Product (PROBIOTIC DAILY PO) Take 2 tablets by mouth 2 (two) times daily.    . propranolol (INDERAL) 60 MG tablet Take 1 tablet (60 mg total) by mouth 2 (two) times daily. 180 tablet 2   No current facility-administered medications for this visit.     Family History  Problem Relation Age of Onset  . Cancer Father   . Colon cancer Neg Hx   . Thyroid cancer Mother   . Cancer Mother     thyroid/ renal cell ca     ROS:  Pertinent items are noted in HPI.  Otherwise, a comprehensive ROS was negative.  Exam:   BP 128/78 (BP Location: Right Arm, Patient Position: Sitting, Cuff Size: Normal)   Pulse 60   Resp 16   Ht 5\' 3"  (1.6 m)   Wt 161 lb (73 kg)   LMP 08/25/1992   BMI 28.52 kg/m  Height: 5\' 3"  (160 cm) Ht Readings from Last 3 Encounters:  06/23/16 5\' 3"  (1.6 m)  11/23/15 5\' 3"  (1.6 m)  06/22/15 5\' 3"  (1.6 m)    General appearance: alert, cooperative and appears stated age Head: Normocephalic, without obvious abnormality, atraumatic Neck: no adenopathy, supple, symmetrical, trachea midline and thyroid normal to inspection and palpation Lungs: clear to auscultation bilaterally Breasts: normal appearance, no masses or tenderness Heart: regular rate and rhythm Abdomen: soft, non-tender; no masses,  no organomegaly Extremities: extremities normal, atraumatic, no cyanosis or edema Skin: Skin color, texture, turgor normal. No rashes or lesions Lymph nodes: Cervical, supraclavicular, and axillary nodes normal. No abnormal inguinal nodes palpated Neurologic: Grossly normal   Pelvic: External genitalia:  no lesions but there is a red area like a scratch on the inner left labia.  No  Signs of LSA or lesion,              Urethra:  normal appearing urethra with no masses, tenderness or lesions              Bartholin's and Skene's: normal                 Vagina: normal appearing vagina with normal color and discharge, no lesions              Cervix: absent              Pap  taken: No. 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   She is given a refill on Temovate which she has had in the past for vulvitis - use BID for 5-7 days.   Counseled on breast self exam, mammography screening, adequate intake of calcium and vitamin D, diet and exercise return annually or prn  After Visit Summary was printed and given to the patient.

## 2016-06-23 NOTE — Patient Instructions (Signed)

## 2016-06-24 NOTE — Progress Notes (Signed)
Encounter reviewed by Dr. Brook Amundson C. Silva.  

## 2016-06-30 ENCOUNTER — Telehealth: Payer: Self-pay | Admitting: Neurology

## 2016-06-30 MED ORDER — PREDNISONE 10 MG PO TABS: ORAL_TABLET | ORAL | 0 refills | Status: DC

## 2016-06-30 MED ORDER — TOPIRAMATE 25 MG PO TABS: ORAL_TABLET | ORAL | 1 refills | Status: DC

## 2016-06-30 NOTE — Addendum Note (Signed)
Addended by: Gerda Diss A on: 06/30/2016 03:16 PM   Modules accepted: Orders

## 2016-06-30 NOTE — Telephone Encounter (Signed)
Recommend prednisone 10mg  taper.  Start 60mg  and decrease by 10mg  every day until done.    We can increase propranolol to 80mg  twice daily (as long as she is not feeling lightheaded)

## 2016-06-30 NOTE — Telephone Encounter (Signed)
Decrease prednisone by 10mg  every THREE days until finished.  Don't change propranolol for now.  Start topiramate 25mg  at bedtime for 7 days, then increase to 50mg  at bedtime. Possible side effects include: impaired thinking, sedation, paresthesias (numbness and tingling) and weight loss.  It may cause dehydration and there is a small risk for kidney stones, so make sure to stay hydrated with water during the day.  There is also a very small risk for glaucoma, so if you notice any change in your vision while taking this medication, see an ophthalmologist.     Check baseline CMP

## 2016-06-30 NOTE — Telephone Encounter (Signed)
Patient wants to talk to someone she is having vertigo really bad it has been for hte last 3 weeks please call [phone]

## 2016-06-30 NOTE — Telephone Encounter (Signed)
Spoke with patient. Complaints of headache/dizziness x 3 weeks. Headache pain 8/10 in A.M. By 3-4 p.m. Pain level reduced to 6/10. Never lower than 6/10. Pt also complains of constant pressure, "like there are two fists in my head pushing out as hard as they can". Pt feels very "wobbly", denies any falls. Pt still taking propranolol 60 mg bid. Pt also took some Advil Sinus congestion this weekend w/o any relief. Please advise. Pt scheduled f/u for 07/29/16. Pt was added to waitlist, apparently called last week and was upset she could not get in to be seen.

## 2016-06-30 NOTE — Telephone Encounter (Signed)
RX sent to pharmacy. Pt states she is having some lightheadness. Propranolol 80 mg not sent to pharmacy.

## 2016-06-30 NOTE — Telephone Encounter (Signed)
Message relayed to patient. Verbalized understanding and denied questions. RXs sent in.

## 2016-07-03 ENCOUNTER — Encounter: Payer: Self-pay | Admitting: Family Medicine

## 2016-07-07 ENCOUNTER — Telehealth: Payer: Self-pay

## 2016-07-07 DIAGNOSIS — R42 Dizziness and giddiness: Secondary | ICD-10-CM

## 2016-07-07 NOTE — Telephone Encounter (Signed)
She should restart topiramate.  She can start at 50mg  at bedtime.   In case the dizziness is related to possible migraine, we can prescribe her sumatriptan.  If she feels she can hold down a pill, we can prescribe her 100mg  tablet (take 1 tablet and may repeat dose after 2 hours if needed but not to exceed 2 tablets in 24 hours).  If she feels she cannot hold down a pill, then I recommend the injection (6mg  injection subcutaneous, may repeat once after 1 hour if needed and not to exceed 2 injections in 24 hours).

## 2016-07-07 NOTE — Telephone Encounter (Signed)
She should just take it and see.

## 2016-07-07 NOTE — Telephone Encounter (Addendum)
Pt called with complaints of continuing dizziness. States that medication is not helping. On 06/30/16 pt was started on a 21 day prednisone taper, and Topamax 25 mg. Pt stated she is continuing on prednisone however, the Topamax she has stopped because she was only given 7 tablets from pharmacy. Called pharmacy, spoke to [place], pharmacist. Who stated they split up the prescription we sent in to make it "easier and less confusing for pt". They gave her only the 1 week's supply for 25 mg QHS, and told her to come back in one week. Pharmacist stated they wrote for her to come back and pick up 50 mg QHS in 1 week on the bag. Advised pharmacist that was really not appreciated or appropriate as now pt is very confused. Pt stated that she cannot handle this dizziness and really needs some help. Please advise.    Technical sales engineer - [place] [phone]

## 2016-07-07 NOTE — Telephone Encounter (Signed)
Pt wishes to hold off for the time being. Going to contact PCP, possibly see about second opinion.

## 2016-07-07 NOTE — Telephone Encounter (Signed)
Vestibular Rehab also offered.

## 2016-07-07 NOTE — Telephone Encounter (Signed)
Relayed message to pt. Pt wanted to know how to take medication as dizziness is constant. There is no "onset". Please advise.

## 2016-07-08 NOTE — Telephone Encounter (Signed)
Pt called back to request vestibular rehab. Referral placed to [facility]

## 2016-07-08 NOTE — Addendum Note (Signed)
Addended by: Gerda Diss A on: 07/08/2016 02:28 PM   Modules accepted: Orders

## 2016-07-14 ENCOUNTER — Other Ambulatory Visit: Payer: Self-pay | Admitting: Nurse Practitioner

## 2016-07-14 NOTE — Telephone Encounter (Signed)
Medication refill request: Vagifem  Last AEX:  06-23-16  Next AEX: 06-26-17  Last MMG (if hormonal medication request): 06-16-16 WNL  Refill authorized: please advise

## 2016-07-21 ENCOUNTER — Ambulatory Visit (INDEPENDENT_AMBULATORY_CARE_PROVIDER_SITE_OTHER): Payer: Medicare Other | Admitting: Family Medicine

## 2016-07-21 ENCOUNTER — Encounter: Payer: Self-pay | Admitting: Family Medicine

## 2016-07-21 VITALS — BP 92/62 | HR 68 | Temp 97.9°F | Ht 63.0 in | Wt 157.7 lb

## 2016-07-21 DIAGNOSIS — R51 Headache: Secondary | ICD-10-CM | POA: Diagnosis not present

## 2016-07-21 DIAGNOSIS — R42 Dizziness and giddiness: Secondary | ICD-10-CM

## 2016-07-21 DIAGNOSIS — R519 Headache, unspecified: Secondary | ICD-10-CM

## 2016-07-21 MED ORDER — PROPRANOLOL HCL 10 MG PO TABS: 40.0000 mg | ORAL_TABLET | Freq: Two times a day (BID) | ORAL | 2 refills | Status: DC

## 2016-07-21 NOTE — Patient Instructions (Addendum)
BEFORE YOU LEAVE: -labs in 3 weeks -follow up: 3 months for medicare wellness visit.  I sent the lowest dose of the propranolol to your pharmacy, 10mg  tablets. You can taper this very slowly by 10mg  per week as we discussed. 40 mg morning 30mg  evening for 1 week, Then, 30 mg twice daily for 1 week Then, 30mg  morning/20mg  evening for 1 week Then, 20mg  twice daily for 1 week Then 20mg  morning/10mg  evening for 1 week Then, 10mg  twice daily for 1 week Then 10 mg once daily for 1 week Then stop   We placed a referral for you as discussed to a [facility] for the vertigo and headaches. It usually takes about 1-2 weeks to process and schedule this referral. If you have not heard from [place] regarding this appointment in 2 weeks please contact our office.  Follow up with your current neurologist if any concerns in the interim.

## 2016-07-21 NOTE — Progress Notes (Signed)
Pre visit review using our clinic review tool, if applicable. No additional management support is needed unless otherwise documented below in the visit note. 

## 2016-07-21 NOTE — Progress Notes (Signed)
HPI:  Brenda [name] is a pleasant 70 year old with a complicated past medical history significant for vestibular migraines and chronic vertigo here for a follow-up visit. I have not seen her in a long time, over 1 year. She sees a neurologist for her migraines and vertigo and sees the gastroenterologist for history of hepatitis and acid reflux. Due for flu shot and Medicare exam. She reports that though she has a long history starting at age 72, and frequent vertigo and headaches, and that these symptoms have become increasingly worse since 2016. She is frustrated with her current treatment with her neurologist. She feels they have tried several medications without any relief in her symptoms, but they have not seen her for repeat evaluation, nor done any testing or imaging. She reports that she was told recently by her neurologist that they have nothing else. Her that she should see me for referral to another neurologist for a second opinion. She will be starting vestibular rehabilitation tomorrow which was prescribed by her current neurologist. She reports her symptoms started out lasting about 3-7 days, but they have become increasingly worse over the last year, with migraines lasting up to several weeks in length and her current headache and vertigo beginning over a month ago. She reports she has daily pain behind both eyes and in the frontal region, pressure in this region and vertigo. She reports her neurologist has tried a long prednisone taper and Topamax in the last month, neither of which worked. She reports she has been on propranolol since 2016, 40 mg twice daily, but this has not helped at all either and she wants to eat off of it. She is very anxious about stopping this medication as she has heard that it can be very difficult to wean off of. Her neurologist has started to wean her off of her estrogen to see if this helps. She'll be off of this in the next 2 months. She hasn't had any  significant changes in her vision, but she did have cataract surgery in 2016. She doesn't feel like she's had any increased stress in her life, though she does care for her mother and is bedridden. She sees her gastroenterologist, [name] for her gastrointestinal issues. Denies fevers, weight loss, vision changes, speech changes, sinus congestion, nasal congestion, postnasal drip, cough, depression, malaise, weakness or numbness.    ROS: See pertinent positives and negatives per HPI.  Past Medical History:  Diagnosis Date  . Arthritis   . Bronchitis    hx of when smoked   . Cancer ([place]) 04/16/09   kidney cancer - tx by [facility] per her report released from f/u  . Cataracts, bilateral   . Diverticulitis 11/2004  . Endometriosis 1994  . GERD (gastroesophageal reflux disease)    hx hiatal hernia, hx esophageal stricture s/p dilation  . Headache   . Hepatitis   . History of chicken pox   . History of hiatal hernia   . History of measles   . History of mumps as a child   . Hx of hepatitis    with cirrhosis - per her report from [place] and eval in 2013 at [facility]  . Hyperlipidemia   . Left ear pain   . Migraines    pt states vesticular migraines has dizziness in relation   . Numbness and tingling    feet bilat has had for 40 years  . Right knee pain   . Scarlet fever   . Shingles   .  Urinary tract bacterial infections    hx of  . Vertigo    associated with headache, intermittent, chronic    Past Surgical History:  Procedure Laterality Date  . ABDOMINAL HYSTERECTOMY  1994   TAH/BSO due to endometriosis  . CATARACT EXTRACTION W/ INTRAOCULAR LENS IMPLANT Bilateral 05/2015, 04/2015  . DIAGNOSTIC LAPAROSCOPY    . LAPAROSCOPIC PARTIAL COLECTOMY N/A 04/10/2015   Procedure: LAPAROSCOPIC PARTIAL CECTOMY;  Surgeon: Ralene Ok, MD;  Location: [facility];  Service: General;  Laterality: N/A;  . LUNG BIOPSY  1996   Negative  . NEPHRECTOMY  04/16/09   Partial removal due  to cancer  . OOPHORECTOMY    . TONSILLECTOMY    . TONSILLECTOMY      Family History  Problem Relation Age of Onset  . Cancer Father   . Thyroid cancer Mother   . Cancer Mother     thyroid/ renal cell ca   . Colon cancer Neg Hx     Social History   Social History  . Marital status: Married    Spouse name: N/A  . Number of children: 0  . Years of education: N/A   Occupational History  . Retired Retired   Social History Main Topics  . Smoking status: Former Smoker    Packs/day: 1.50    Years: 20.00    Types: Cigarettes    Quit date: 08/25/1990  . Smokeless tobacco: Never Used  . Alcohol use No     Comment: Rare  . Drug use: No  . Sexual activity: No     Comment: TAH   Other Topics Concern  . None   Social History Narrative  . None     Current Outpatient Prescriptions:  .  clobetasol ointment (TEMOVATE) [id number] %, Apply 1 application topically 2 (two) times daily., Disp: 30 g, Rfl: 0 .  cycloSPORINE (RESTASIS) 0.05 % ophthalmic emulsion, Apply to eye 2 (two) times daily., Disp: , Rfl:  .  DEXILANT 60 MG capsule, Take 60 mg by mouth daily., Disp: , Rfl:  .  glucosamine-chondroitin 500-400 MG tablet, Take 2 tablets by mouth daily., Disp: , Rfl:  .  Multiple Vitamin (MULTIVITAMIN) tablet, Take 1 tablet by mouth daily.  , Disp: , Rfl:  .  ofloxacin (OCUFLOX) 0.3 % ophthalmic solution, , Disp: , Rfl:  .  predniSONE (DELTASONE) 10 MG tablet, 10mg  tablets. Take 6tabs x3day, then 5tabs x3day, then 4tabs x3day, then 3tabs x3day, then 2tabs x3day, then 1tab x3day, then STOP, Disp: 126 tablet, Rfl: 0 .  Probiotic Product (PROBIOTIC DAILY PO), Take 2 tablets by mouth 2 (two) times daily., Disp: , Rfl:  .  propranolol (INDERAL) 10 MG tablet, Take 4 tablets (40 mg total) by mouth 2 (two) times daily., Disp: 240 tablet, Rfl: 2 .  topiramate (TOPAMAX) 25 MG tablet, topiramate 25mg  at bedtime for 7 days, then increase to 50mg  at bedtime., Disp: 60 tablet, Rfl: 1 .  YUVAFEM 10 MCG TABS  vaginal tablet, USE TWICE WEEKLY AS DIRECTED, Disp: 24 tablet, Rfl: 3  EXAM:  Vitals:   07/21/16 1257  BP: 92/62  Pulse: 68  Temp: 97.9 F (36.6 C)    Body mass index is 27.94 kg/m.  GENERAL: vitals reviewed and listed above, alert, oriented, appears well hydrated and in no acute distress  HEENT: atraumatic, conjunttiva clear, PERRLA, EOMI, no obvious abnormalities on inspection of external nose and ears, normal inspection of both ear canals and TMs without effusion, normal inspection of the nasal mucosa without  significant sinus congestion, normal inspection of the oropharynx without postnasal drip.  NECK: no obvious masses on inspection  LUNGS: clear to auscultation bilaterally, no wheezes, rales or rhonchi, good air movement  CV: HRRR, no peripheral edema  MS: moves all extremities without noticeable abnormality  PSYCH/NEURO: pleasant and cooperative, no obvious depression or anxiety, speech and thought processing grossly intact, walks with a cane but gait seems intact, cranial nerves II through XII grossly intact, finger to nose normal.  ASSESSMENT AND PLAN:  Discussed the following assessment and plan:  Vertigo - Plan: Ambulatory referral to Neurology, CBC with Differential/Platelets, Basic metabolic panel  Frequent headaches - Plan: Ambulatory referral to Neurology, CBC with Differential/Platelets, Basic metabolic panel  -we discussed possible serious and likely etiologies, workup and treatment, treatment risks and return precautions - seems she was diagnosed with vestibular migraines by her neurologist, but has not had relief of the symptoms with multiple treatments -She will start vestibular rehabilitation tomorrow, prescribed for her neurologist -she reports her neurologist told her to see me for a referral to another neurologist for second opinion and she wishes to do this, referral placed - advised she follow-up with her current neurologist in the interim if any  worsening or new symptoms or any trouble coming off the propranolol -She wanted to taper the propranolol so I did give her a low dose of this to taper very slowly given her extreme anxiety about this -She will need some basic labs, but just came off the prednisone so we will do these in several weeks -Also advised neuroimaging, but she would prefer to do this with her new neurologist and opted to hold off on this for now -follow up advised  in 3 months for Medicare wellness visit -of course, we advised Brenda [name]  to return or notify a doctor immediately if symptoms worsen or persist or new concerns arise.   -Patient advised to return or notify a doctor immediately if symptoms worsen or persist or new concerns arise.  Patient Instructions  BEFORE YOU LEAVE: -labs in 3 weeks -follow up: 3 months for medicare wellness visit.  I sent the lowest dose of the propranolol to your pharmacy, 10mg  tablets. You can taper this very slowly by 10mg  per week as we discussed. 40 mg morning 30mg  evening for 1 week, Then, 30 mg twice daily for 1 week Then, 30mg  morning/20mg  evening for 1 week Then, 20mg  twice daily for 1 week Then 20mg  morning/10mg  evening for 1 week Then, 10mg  twice daily for 1 week Then 10 mg once daily for 1 week Then stop   We placed a referral for you as discussed to a [facility] for the vertigo and headaches. It usually takes about 1-2 weeks to process and schedule this referral. If you have not heard from [place] regarding this appointment in 2 weeks please contact our office.  Follow up with your current neurologist if any concerns in the interim.   Colin Benton R., DO

## 2016-07-22 DIAGNOSIS — H8143 Vertigo of central origin, bilateral: Secondary | ICD-10-CM | POA: Diagnosis not present

## 2016-07-22 DIAGNOSIS — R42 Dizziness and giddiness: Secondary | ICD-10-CM | POA: Diagnosis not present

## 2016-07-22 DIAGNOSIS — R260 Ataxic gait: Secondary | ICD-10-CM | POA: Diagnosis not present

## 2016-07-22 DIAGNOSIS — G43811 Other migraine, intractable, with status migrainosus: Secondary | ICD-10-CM | POA: Diagnosis not present

## 2016-07-28 DIAGNOSIS — H8143 Vertigo of central origin, bilateral: Secondary | ICD-10-CM | POA: Diagnosis not present

## 2016-07-28 DIAGNOSIS — G43811 Other migraine, intractable, with status migrainosus: Secondary | ICD-10-CM | POA: Diagnosis not present

## 2016-07-28 DIAGNOSIS — R260 Ataxic gait: Secondary | ICD-10-CM | POA: Diagnosis not present

## 2016-07-28 DIAGNOSIS — R42 Dizziness and giddiness: Secondary | ICD-10-CM | POA: Diagnosis not present

## 2016-07-29 ENCOUNTER — Other Ambulatory Visit: Payer: Self-pay | Admitting: Family Medicine

## 2016-07-29 ENCOUNTER — Ambulatory Visit: Payer: Medicare Other | Admitting: Neurology

## 2016-07-29 NOTE — Telephone Encounter (Signed)
Pt states her Rx for propranolol (INDERAL) 10 MG tablet  is not correct and she gave Dr Maudie Mercury incorrect information. Pt would like a call back to straighten this out. Would like a call back today. Pt feels like she is taking this wrong.

## 2016-07-30 NOTE — Telephone Encounter (Signed)
Please call pt and find out what she needs. Thanks.

## 2016-07-31 NOTE — Telephone Encounter (Signed)
Please call pt. We gave her very detailed instructions on her patient handout for her propranolol given she wanted to stop it and sent 10mg  tablets. AS we explained at her appt we can not print the instructions on the bottle for a taper as they are too lengthy.  She wanted to do a very slow taper as was anxious about coming off.  She can follow the same instructions starting with whatever dose she is on and weaning by 10-20mg  per week.

## 2016-07-31 NOTE — Telephone Encounter (Signed)
I left a message for the pt to return my call. 

## 2016-07-31 NOTE — Telephone Encounter (Signed)
Patient left a message on my voicemail stating she was confused about the step down dosage of her medication as she thought she was taking 40mg  daily but realized she has been taking 60mg  since June and cannot recall what her other doctor told her for some reason.  Stated she has picked the Rx up from the pharmacy twice and did not notice this until now?  Message sent to Dr Maudie Mercury.

## 2016-08-01 NOTE — Telephone Encounter (Signed)
Patient called back and I informed her per Dr Maudie Mercury to decrease the dose by 10mg  each week regardless of the dose she is taking and she agreed.

## 2016-08-04 DIAGNOSIS — H8143 Vertigo of central origin, bilateral: Secondary | ICD-10-CM | POA: Diagnosis not present

## 2016-08-04 DIAGNOSIS — G43811 Other migraine, intractable, with status migrainosus: Secondary | ICD-10-CM | POA: Diagnosis not present

## 2016-08-04 DIAGNOSIS — R42 Dizziness and giddiness: Secondary | ICD-10-CM | POA: Diagnosis not present

## 2016-08-04 DIAGNOSIS — R260 Ataxic gait: Secondary | ICD-10-CM | POA: Diagnosis not present

## 2016-08-11 ENCOUNTER — Other Ambulatory Visit: Payer: Medicare Other

## 2016-08-12 ENCOUNTER — Other Ambulatory Visit: Payer: Medicare Other

## 2016-08-20 ENCOUNTER — Other Ambulatory Visit (INDEPENDENT_AMBULATORY_CARE_PROVIDER_SITE_OTHER): Payer: Medicare Other

## 2016-08-20 ENCOUNTER — Telehealth: Payer: Self-pay | Admitting: *Deleted

## 2016-08-20 DIAGNOSIS — R42 Dizziness and giddiness: Secondary | ICD-10-CM | POA: Diagnosis not present

## 2016-08-20 DIAGNOSIS — R519 Headache, unspecified: Secondary | ICD-10-CM

## 2016-08-20 DIAGNOSIS — R51 Headache: Secondary | ICD-10-CM

## 2016-08-20 LAB — BASIC METABOLIC PANEL
BUN: 7 mg/dL (ref 6–23)
CO2: 27 mEq/L (ref 19–32)
Calcium: 8.5 mg/dL (ref 8.4–10.5)
Chloride: 102 mEq/L (ref 96–112)
Creatinine, Ser: 0.76 mg/dL (ref 0.40–1.20)
GFR: 79.86 mL/min (ref >60.00)
Glucose, Bld: 91 mg/dL (ref 70–99)
POTASSIUM: 4.4 meq/L (ref 3.5–5.1)
SODIUM: 136 meq/L (ref 135–145)

## 2016-08-20 LAB — CBC WITH DIFFERENTIAL/PLATELET
BASOS PCT: 0.7 % (ref 0.0–3.0)
Basophils Absolute: 0 10*3/uL (ref 0.0–0.1)
EOS ABS: 0 10*3/uL (ref 0.0–0.7)
Eosinophils Relative: 0.6 % (ref 0.0–5.0)
HEMATOCRIT: 35.9 % — AB (ref 36.0–46.0)
Hemoglobin: 12.1 g/dL (ref 12.0–15.0)
LYMPHS ABS: 1.5 10*3/uL (ref 0.7–4.0)
Lymphocytes Relative: 21.9 % (ref 12.0–46.0)
MCHC: 33.5 g/dL (ref 30.0–36.0)
MCV: 92.8 fl (ref 78.0–100.0)
MONO ABS: 1 10*3/uL (ref 0.1–1.0)
Monocytes Relative: 14.2 % — ABNORMAL HIGH (ref 3.0–12.0)
NEUTROS ABS: 4.2 10*3/uL (ref 1.4–7.7)
NEUTROS PCT: 62.6 % (ref 43.0–77.0)
PLATELETS: 248 10*3/uL (ref 150.0–400.0)
RBC: 3.88 Mil/uL (ref 3.87–5.11)
RDW: 15.2 % (ref 11.5–15.5)
WBC: 6.7 10*3/uL (ref 4.0–10.5)

## 2016-08-20 NOTE — Telephone Encounter (Signed)
Patient came into the office and states that she wanted to let the RN know that she has not heard anything back from the 3 referrals that was placed for her  I asked her if she wanted to talk to the referral coordinator. Patient refused. She is requesting a call back . Her number is [phone]. Please advise. Thank you

## 2016-08-21 ENCOUNTER — Other Ambulatory Visit: Payer: Self-pay | Admitting: Family Medicine

## 2016-08-21 DIAGNOSIS — R519 Headache, unspecified: Secondary | ICD-10-CM

## 2016-08-21 DIAGNOSIS — R51 Headache: Secondary | ICD-10-CM

## 2016-08-21 DIAGNOSIS — R42 Dizziness and giddiness: Secondary | ICD-10-CM

## 2016-08-21 NOTE — Telephone Encounter (Signed)
I called the pt and offered to give her the phone number for neurology as they have been trying to reach her.  Patient stated she was already seen by Dr Tomi Likens and was to see a specialist at [facility] or [facility] and she has not heard anything.  Message sent to Dr Maudie Mercury.

## 2016-08-21 NOTE — Progress Notes (Signed)
New referral sent. Hilda Blades, could you please contact his pt with referral information? Thank you.

## 2016-08-28 ENCOUNTER — Telehealth: Payer: Self-pay | Admitting: *Deleted

## 2016-08-28 NOTE — Telephone Encounter (Signed)
I called the pt and advised her per Dr Maudie Mercury she placed a new referral to be seen by a neurologist at [facility] or [facility] and this was entered on 12/28 and their office will contact her.  I informed the pt it can take time to get in with a specialist of this type and to call us if she has not heard anything in 2 weeks and she agreed.

## 2016-09-02 DIAGNOSIS — R197 Diarrhea, unspecified: Secondary | ICD-10-CM | POA: Diagnosis not present

## 2016-09-02 DIAGNOSIS — K7460 Unspecified cirrhosis of liver: Secondary | ICD-10-CM | POA: Diagnosis not present

## 2016-09-02 DIAGNOSIS — K219 Gastro-esophageal reflux disease without esophagitis: Secondary | ICD-10-CM | POA: Diagnosis not present

## 2016-09-08 DIAGNOSIS — R197 Diarrhea, unspecified: Secondary | ICD-10-CM | POA: Diagnosis not present

## 2016-10-20 NOTE — Progress Notes (Signed)
Medicare Annual Preventive Care Visit  (initial annual wellness or annual wellness exam)  Concerns and/or follow up today:  PMH Migraines, vertigo - seeing neurology, hepatitis and GERD - sees GI. Has a new issues of "pulled back". Was reaching for something yesterday and felt a pain in the L low back. Still quite sore. No radiation, weakness, numbness, malaise, fevers. No hx back disease. Aleve helps the pain. Certain movements worsen it. Reports the vertigo is much better. Has been doing vestibular rehab. Also has appointment with [place] neurologist coming up. Sees [facility] for gyn exams, bone density and mammograms per her report. UTD on colon ca screening. Sees scanned documentation for further details and health risks assessment.  ROS: negative for report of fevers, unintentional weight loss, vision changes, vision loss, hearing loss or change, chest pain, sob, hemoptysis, melena, hematochezia, hematuria, genital discharge or lesions, falls, bleeding or bruising, loc, thoughts of suicide or self harm, memory loss  1.) Patient-completed health risk assessment  - completed and reviewed, see scanned documentation  2.) Review of Medical History: -PMH, PSH, Family History and current specialty and care providers reviewed and updated and listed below  - see scanned in document in chart and below  Past Medical History:  Diagnosis Date  . Arthritis   . Bronchitis    hx of when smoked   . Cancer ([place]) 04/16/09   kidney cancer - tx by [facility] per her report released from f/u  . Cataracts, bilateral   . Diverticulitis 11/2004  . Endometriosis 1994  . GERD (gastroesophageal reflux disease)    hx hiatal hernia, hx esophageal stricture s/p dilation  . Headache   . Hepatitis   . History of chicken pox   . History of hiatal hernia   . History of measles   . History of mumps as a child   . Hx of hepatitis    with cirrhosis - per her report from [place] and eval in 2013 at  [place]  . Hyperlipidemia   . Left ear pain   . Migraines    pt states vesticular migraines has dizziness in relation   . Numbness and tingling    feet bilat has had for 40 years  . Right knee pain   . Scarlet fever   . Shingles   . Urinary tract bacterial infections    hx of  . Vertigo    associated with headache, intermittent, chronic    Past Surgical History:  Procedure Laterality Date  . ABDOMINAL HYSTERECTOMY  1994   TAH/BSO due to endometriosis  . CATARACT EXTRACTION W/ INTRAOCULAR LENS IMPLANT Bilateral 05/2015, 04/2015  . DIAGNOSTIC LAPAROSCOPY    . LAPAROSCOPIC PARTIAL COLECTOMY N/A 04/10/2015   Procedure: LAPAROSCOPIC PARTIAL CECTOMY;  Surgeon: Ralene Ok, MD;  Location: [facility];  Service: General;  Laterality: N/A;  . LUNG BIOPSY  1996   Negative  . NEPHRECTOMY  04/16/09   Partial removal due to cancer  . OOPHORECTOMY    . TONSILLECTOMY    . TONSILLECTOMY      Social History   Social History  . Marital status: Married    Spouse name: N/A  . Number of children: 0  . Years of education: N/A   Occupational History  . Retired Retired   Social History Main Topics  . Smoking status: Former Smoker    Packs/day: 1.50    Years: 20.00    Types: Cigarettes    Quit date: 08/25/1990  . Smokeless tobacco: Never Used  .  Alcohol use No     Comment: Rare  . Drug use: No  . Sexual activity: No     Comment: TAH   Other Topics Concern  . Not on file   Social History Narrative  . No narrative on file    Family History  Problem Relation Age of Onset  . Cancer Father   . Thyroid cancer Mother   . Cancer Mother     thyroid/ renal cell ca   . Colon cancer Neg Hx     Current Outpatient Prescriptions on File Prior to Visit  Medication Sig Dispense Refill  . clobetasol ointment (TEMOVATE) [id number] % Apply 1 application topically 2 (two) times daily. 30 g 0  . cycloSPORINE (RESTASIS) 0.05 % ophthalmic emulsion Apply to eye 2 (two) times daily.    Marland Kitchen DEXILANT 60 MG  capsule Take 60 mg by mouth daily.    . Multiple Vitamin (MULTIVITAMIN) tablet Take 1 tablet by mouth daily.      Marland Kitchen ofloxacin (OCUFLOX) 0.3 % ophthalmic solution     . predniSONE (DELTASONE) 10 MG tablet 10mg  tablets. Take 6tabs x3day, then 5tabs x3day, then 4tabs x3day, then 3tabs x3day, then 2tabs x3day, then 1tab x3day, then STOP 126 tablet 0  . Probiotic Product (PROBIOTIC DAILY PO) Take 2 tablets by mouth 2 (two) times daily.    Merril Abbe 10 MCG TABS vaginal tablet USE TWICE WEEKLY AS DIRECTED 24 tablet 3   No current facility-administered medications on file prior to visit.      3.) Review of functional ability and level of safety:  Any difficulty hearing?  See scanned documentation  History of falling?  See scanned documentation  Any trouble with IADLs - using a phone, using transportation, grocery shopping, preparing meals, doing housework, doing laundry, taking medications and managing money?  See scanned documentation  Advance Directives? She has this and agrees to bring copy.  See summary of recommendations in Patient Instructions below.  4.) Physical Exam Vitals:   10/21/16 0919  BP: 118/64  Pulse: 73  Temp: 97.4 F (36.3 C)   Estimated body mass index is 26.94 kg/m as calculated from the following:   Height as of this encounter: 5' 3.25" (1.607 m).   Weight as of this encounter: 153 lb 4.8 oz (69.5 kg).  EKG (optional): deferred  General: alert, appear well hydrated and in no acute distress  HEENT: visual acuity grossly intact  CV: HRRR  Lungs: CTA bilaterally  MS/NEURO: Normal Gait Normal inspection of back, no obvious scoliosis or leg length descrepancy No bony TTP Soft tissue TTP at: left quad lumborum muscle -/+ tests: neg trendelenburg,-facet loading, -SLRT, -CLRT, -FABER, -FADIR Normal muscle strength, sensation to light touch and DTRs in LEs bilaterally  Psych: pleasant and cooperative, no obvious depression or anxiety  Cognitive  function grossly intact  See patient instructions for recommendations.  Education and counseling regarding the above review of health provided with a plan for the following: -see scanned patient completed form for further details -fall prevention strategies discussed  -healthy lifestyle discussed -importance and resources for completing advanced directives discussed -see patient instructions below for any other recommendations provided  4)The following written screening schedule of preventive measures were reviewed with assessment and plan made per below, orders and patient instructions:      AAA screening done if applicable     Alcohol screening done     Obesity Screening and counseling done     STI screening (Hep C if born  [id number]) offered and per pt wishes     Tobacco Screening done      Pneumococcal (PPSV23 -one dose after 64, one before if risk factors), influenza yearly and hepatitis B vaccines (if high risk - end stage renal disease, IV drugs, homosexual men, live in home for mentally retarded, hemophilia receiving factors) ASSESSMENT/PLAN: done      Screening mammograph (yearly if >40) ASSESSMENT/PLAN: utd, sees gyn for this      Screening Pap smear/pelvic exam (q2 years) ASSESSMENT/PLAN: n/a, sees gyn for this      Colorectal cancer screening (FOBT yearly or flex sig q4y or colonoscopy q10y or barium enema q4y) ASSESSMENT/PLAN: utd       Diabetes outpatient self-management training services ASSESSMENT/PLAN: utd or done - blood glu done on recent labs      Bone mass measurements(covered q2y if indicated - estrogen def, osteoporosis, hyperparathyroid, vertebral abnormalities, osteoporosis or steroids) ASSESSMENT/PLAN: reports she does this with gyn and normal      Screening for glaucoma(q1y if high risk - diabetes, FH, AA and > 50 or hispanic and > 65) ASSESSMENT/PLAN: utd per her report      Medical nutritional therapy for individuals with diabetes or renal  disease ASSESSMENT/PLAN: n/a      Cardiovascular screening blood tests (lipids q5y) ASSESSMENT/PLAN: Lipid check today      Diabetes screening tests ASSESSMENT/PLAN: Blood glu done on recent labs   7.) Summary:   Encounter for Medicare annual wellness exam -risk factors and conditions per above assessment were discussed and treatment, recommendations and referrals were offered per documentation above and orders and patient instructions.  Acute left-sided low back pain without sciatica -we discussed possible serious and likely etiologies, workup and treatment, treatment risks and return precautions; likely muscle strain -after this discussion, Braydee opted for HEP, heat, prn OTC analgesic -follow up advised in 1 month -of course, we advised Chaylene  to return or notify a doctor immediately if symptoms worsen or persist or new concerns arise.  Vertigo: seeing neurology, improving  Sedentary lifestyle: advise of importance of increasing then maintaining activity, discussed options  Poor diet: discuss a healthy diet and advise - see instructions  Cirrhosis of liver without ascites, unspecified hepatic cirrhosis type (HCC) Gastroesophageal reflux disease, esophagitis presence not specified -sees gi for management  Hyperlipidemia, unspecified hyperlipidemia type - Plan: Lipid Panel -lifestyle recs  Patient Instructions  BEFORE YOU LEAVE: -follow up: 1 month for back and to check the skin lesion on the head -low back exercises -labs  Fot the back pain: -heat several time daily -topical sports creams if needed for pain -aleve per instructions if needed for pain -do the exercises provided 3-4 days per week -follow up sooner if worsening or new concerns   We recommend the following healthy lifestyle for LIFE: 1) Small portions.   Tip: eat off of a salad plate instead of a dinner plate.  Tip: if you need more or a snack choose fruits, veggies and/or a handful of nuts or  seeds.  2) Eat a healthy clean diet.  * Tip: Avoid (less then 1 serving per week): processed foods, sweets, sweetened drinks, white starches (rice, flour, bread, potatoes, pasta, etc), red meat, fast foods, butter  *Tip: CHOOSE instead   * 5-9 servings per day of fresh or frozen fruits and vegetables (but not corn, potatoes, bananas, canned or dried fruit)   *nuts and seeds, beans   *olives and olive oil   *small portions of lean meats such  as fish and white chicken    *small portions of whole grains  3)Get at least 150 minutes of sweaty aerobic exercise per week.  4)Reduce stress - consider counseling, meditation and relaxation to balance other aspects of your life.      Colin Benton R., DO

## 2016-10-21 ENCOUNTER — Encounter: Payer: Self-pay | Admitting: Family Medicine

## 2016-10-21 ENCOUNTER — Ambulatory Visit (INDEPENDENT_AMBULATORY_CARE_PROVIDER_SITE_OTHER): Payer: Medicare Other | Admitting: Family Medicine

## 2016-10-21 VITALS — BP 118/64 | HR 73 | Temp 97.4°F | Ht 63.25 in | Wt 153.3 lb

## 2016-10-21 DIAGNOSIS — R42 Dizziness and giddiness: Secondary | ICD-10-CM

## 2016-10-21 DIAGNOSIS — M545 Low back pain, unspecified: Secondary | ICD-10-CM

## 2016-10-21 DIAGNOSIS — K219 Gastro-esophageal reflux disease without esophagitis: Secondary | ICD-10-CM

## 2016-10-21 DIAGNOSIS — E785 Hyperlipidemia, unspecified: Secondary | ICD-10-CM

## 2016-10-21 DIAGNOSIS — Z0000 Encounter for general adult medical examination without abnormal findings: Secondary | ICD-10-CM | POA: Diagnosis not present

## 2016-10-21 DIAGNOSIS — Z9189 Other specified personal risk factors, not elsewhere classified: Secondary | ICD-10-CM

## 2016-10-21 DIAGNOSIS — E639 Nutritional deficiency, unspecified: Secondary | ICD-10-CM

## 2016-10-21 DIAGNOSIS — K7460 Unspecified cirrhosis of liver: Secondary | ICD-10-CM | POA: Diagnosis not present

## 2016-10-21 LAB — LIPID PANEL
CHOL/HDL RATIO: 5
Cholesterol: 239 mg/dL — ABNORMAL HIGH (ref 0–200)
HDL: 45.1 mg/dL (ref >39.00)
LDL CALC: 164 mg/dL — AB (ref 0–99)
NonHDL: 193.43
TRIGLYCERIDES: 148 mg/dL (ref 0.0–149.0)
VLDL: 29.6 mg/dL (ref 0.0–40.0)

## 2016-10-21 MED ORDER — CYCLOBENZAPRINE HCL 5 MG PO TABS: 5.0000 mg | ORAL_TABLET | Freq: Every evening | ORAL | 0 refills | Status: DC | PRN

## 2016-10-21 NOTE — Progress Notes (Signed)
Pre visit review using our clinic review tool, if applicable. No additional management support is needed unless otherwise documented below in the visit note. 

## 2016-10-21 NOTE — Patient Instructions (Signed)
BEFORE YOU LEAVE: -follow up: 1 month for back and to check the skin lesion on the head -low back exercises -labs  Fot the back pain: -heat several time daily -topical sports creams if needed for pain -aleve per instructions if needed for pain -do the exercises provided 3-4 days per week -follow up sooner if worsening or new concerns   We recommend the following healthy lifestyle for LIFE: 1) Small portions.   Tip: eat off of a salad plate instead of a dinner plate.  Tip: if you need more or a snack choose fruits, veggies and/or a handful of nuts or seeds.  2) Eat a healthy clean diet.  * Tip: Avoid (less then 1 serving per week): processed foods, sweets, sweetened drinks, white starches (rice, flour, bread, potatoes, pasta, etc), red meat, fast foods, butter  *Tip: CHOOSE instead   * 5-9 servings per day of fresh or frozen fruits and vegetables (but not corn, potatoes, bananas, canned or dried fruit)   *nuts and seeds, beans   *olives and olive oil   *small portions of lean meats such as fish and white chicken    *small portions of whole grains  3)Get at least 150 minutes of sweaty aerobic exercise per week.  4)Reduce stress - consider counseling, meditation and relaxation to balance other aspects of your life.

## 2016-11-05 DIAGNOSIS — L821 Other seborrheic keratosis: Secondary | ICD-10-CM | POA: Diagnosis not present

## 2016-11-05 DIAGNOSIS — L820 Inflamed seborrheic keratosis: Secondary | ICD-10-CM | POA: Diagnosis not present

## 2016-11-18 ENCOUNTER — Ambulatory Visit: Payer: Medicare Other | Admitting: Family Medicine

## 2016-11-24 ENCOUNTER — Ambulatory Visit: Payer: BC Managed Care – PPO | Admitting: Neurology

## 2016-11-25 DIAGNOSIS — R269 Unspecified abnormalities of gait and mobility: Secondary | ICD-10-CM | POA: Diagnosis not present

## 2016-11-25 DIAGNOSIS — G609 Hereditary and idiopathic neuropathy, unspecified: Secondary | ICD-10-CM | POA: Diagnosis not present

## 2016-11-25 DIAGNOSIS — R42 Dizziness and giddiness: Secondary | ICD-10-CM | POA: Diagnosis not present

## 2016-11-25 DIAGNOSIS — H832X9 Labyrinthine dysfunction, unspecified ear: Secondary | ICD-10-CM | POA: Diagnosis not present

## 2017-01-06 DIAGNOSIS — R270 Ataxia, unspecified: Secondary | ICD-10-CM | POA: Diagnosis not present

## 2017-01-06 DIAGNOSIS — H903 Sensorineural hearing loss, bilateral: Secondary | ICD-10-CM | POA: Diagnosis not present

## 2017-01-06 DIAGNOSIS — Z87891 Personal history of nicotine dependence: Secondary | ICD-10-CM | POA: Diagnosis not present

## 2017-01-06 DIAGNOSIS — R260 Ataxic gait: Secondary | ICD-10-CM | POA: Diagnosis not present

## 2017-01-06 DIAGNOSIS — R42 Dizziness and giddiness: Secondary | ICD-10-CM | POA: Diagnosis not present

## 2017-01-06 DIAGNOSIS — G43109 Migraine with aura, not intractable, without status migrainosus: Secondary | ICD-10-CM | POA: Diagnosis not present

## 2017-01-06 DIAGNOSIS — G43809 Other migraine, not intractable, without status migrainosus: Secondary | ICD-10-CM | POA: Diagnosis not present

## 2017-01-06 DIAGNOSIS — R269 Unspecified abnormalities of gait and mobility: Secondary | ICD-10-CM | POA: Diagnosis not present

## 2017-01-21 DIAGNOSIS — Z79899 Other long term (current) drug therapy: Secondary | ICD-10-CM | POA: Diagnosis not present

## 2017-01-21 DIAGNOSIS — G43109 Migraine with aura, not intractable, without status migrainosus: Secondary | ICD-10-CM | POA: Diagnosis not present

## 2017-02-09 ENCOUNTER — Ambulatory Visit (INDEPENDENT_AMBULATORY_CARE_PROVIDER_SITE_OTHER): Payer: Medicare Other | Admitting: Family Medicine

## 2017-02-09 ENCOUNTER — Encounter: Payer: Self-pay | Admitting: Family Medicine

## 2017-02-09 VITALS — BP 138/72 | Temp 98.0°F | Ht 63.25 in | Wt 146.0 lb

## 2017-02-09 DIAGNOSIS — R195 Other fecal abnormalities: Secondary | ICD-10-CM | POA: Diagnosis not present

## 2017-02-09 DIAGNOSIS — Z789 Other specified health status: Secondary | ICD-10-CM | POA: Diagnosis not present

## 2017-02-09 DIAGNOSIS — R2689 Other abnormalities of gait and mobility: Secondary | ICD-10-CM | POA: Diagnosis not present

## 2017-02-09 DIAGNOSIS — R42 Dizziness and giddiness: Secondary | ICD-10-CM

## 2017-02-09 NOTE — Patient Instructions (Addendum)
BEFORE YOU LEAVE: -stool cards for blood to take home (NOT screening) -follow up: 30 minute osteopathic treatment appointment in next 4 weeks at end of clinic session  Stop supplements for now.   Do stool cards off iron.  Follow up sooner if worsening or new concerns.  Let your specialists know about any supplements.

## 2017-02-09 NOTE — Progress Notes (Signed)
HPI:  Acute visit for change in stool color: -seeing several specialists at [place] for longstanding vertigo/? Migraines and reports is s/p ext evaluation, neuroimaging and they started her on a "migraine diet" and on a new medication (zonisamide) about 3-4 weeks ago -she also started herself on iron, vit c, mag and b vitamins at the same time -since then she has noticed her stools have changed in texture and color - darker and looser -no BRB, weakness, melena, odor to stools, fevers, malaise, palpitations -she is frustrated with the vertigo and feeling off balance -wonders about any other options, natural medicines, hormones, etc for treatment  ROS: See pertinent positives and negatives per HPI.  Past Medical History:  Diagnosis Date  . Arthritis   . Bronchitis    hx of when smoked   . Cancer ([place]) 04/16/09   kidney cancer - tx by [facility] per her report released from f/u  . Cataracts, bilateral   . Diverticulitis 11/2004  . Endometriosis 1994  . GERD (gastroesophageal reflux disease)    hx hiatal hernia, hx esophageal stricture s/p dilation  . Headache   . Hepatitis   . History of chicken pox   . History of hiatal hernia   . History of measles   . History of mumps as a child   . Hx of hepatitis    with cirrhosis - per her report from [place] and eval in 2013 at [place]  . Hyperlipidemia   . Left ear pain   . Migraines    pt states vesticular migraines has dizziness in relation   . Numbness and tingling    feet bilat has had for 40 years  . Right knee pain   . Scarlet fever   . Shingles   . Urinary tract bacterial infections    hx of  . Vertigo    associated with headache, intermittent, chronic    Past Surgical History:  Procedure Laterality Date  . ABDOMINAL HYSTERECTOMY  1994   TAH/BSO due to endometriosis  . CATARACT EXTRACTION W/ INTRAOCULAR LENS IMPLANT Bilateral 05/2015, 04/2015  . DIAGNOSTIC LAPAROSCOPY    . LAPAROSCOPIC PARTIAL COLECTOMY N/A  04/10/2015   Procedure: LAPAROSCOPIC PARTIAL CECTOMY;  Surgeon: Ralene Ok, MD;  Location: [facility];  Service: General;  Laterality: N/A;  . LUNG BIOPSY  1996   Negative  . NEPHRECTOMY  04/16/09   Partial removal due to cancer  . OOPHORECTOMY    . TONSILLECTOMY    . TONSILLECTOMY      Family History  Problem Relation Age of Onset  . Cancer Father   . Thyroid cancer Mother   . Cancer Mother        thyroid/ renal cell ca   . Colon cancer Neg Hx     Social History   Social History  . Marital status: Married    Spouse name: N/A  . Number of children: 0  . Years of education: N/A   Occupational History  . Retired Retired   Social History Main Topics  . Smoking status: Former Smoker    Packs/day: 1.50    Years: 20.00    Types: Cigarettes    Quit date: 08/25/1990  . Smokeless tobacco: Never Used  . Alcohol use No     Comment: Rare  . Drug use: No  . Sexual activity: No     Comment: TAH   Other Topics Concern  . None   Social History Narrative  . None     Current  Outpatient Prescriptions:  .  clobetasol ointment (TEMOVATE) 7.89 %, Apply 1 application topically 2 (two) times daily., Disp: 30 g, Rfl: 0 .  cycloSPORINE (RESTASIS) 0.05 % ophthalmic emulsion, Apply to eye 2 (two) times daily., Disp: , Rfl:  .  Ferrous Sulfate (IRON) 325 (65 Fe) MG TABS, Take by mouth., Disp: , Rfl:  .  Magnesium 400 MG TABS, Take 1 tablet by mouth daily., Disp: , Rfl:  .  Multiple Vitamin (MULTIVITAMIN) tablet, Take 1 tablet by mouth daily.  , Disp: , Rfl:  .  omeprazole (PRILOSEC) 20 MG capsule, Take 1 capsule by mouth daily., Disp: , Rfl:  .  pyridOXINE (VITAMIN B-6) 100 MG tablet, Take 100 mg by mouth daily., Disp: , Rfl:  .  vitamin B-12 (CYANOCOBALAMIN) 1000 MCG tablet, Take 1,000 mcg by mouth daily., Disp: , Rfl:  .  vitamin C (ASCORBIC ACID) 500 MG tablet, Take 500 mg by mouth daily., Disp: , Rfl:  .  YUVAFEM 10 MCG TABS vaginal tablet, USE TWICE WEEKLY AS DIRECTED, Disp: 24  tablet, Rfl: 3 .  zonisamide (ZONEGRAN) 50 MG capsule, Take 100 mg by mouth daily., Disp: , Rfl:   EXAM:  Vitals:   02/09/17 1323  BP: 138/72  Temp: 98 F (36.7 C)    Body mass index is 25.66 kg/m.  GENERAL: vitals reviewed and listed above, alert, oriented, appears well hydrated and in no acute distress  HEENT: atraumatic, conjunttiva clear, no obvious abnormalities on inspection of external nose and ears  NECK: no obvious masses on inspection  LUNGS: clear to auscultation bilaterally, no wheezes, rales or rhonchi, good air movement  CV: HRRR, no peripheral edema  MS: moves all extremities without noticeable abnormality  PSYCH: pleasant and cooperative, no obvious depression or anxiety, gait normal  ASSESSMENT AND PLAN:  Discussed the following assessment and plan:  Dark stools  Vertigo  Balance problem  Takes dietary supplements  Takes iron supplements  -stool cards to assess for blood - changes likely due to supplements, iron and diet -advised to stop iron and supplements for now unless directed by her doctor (may check labs once off supplements at follow up, sees GI whom she can see if blood found in stool) -she would like to have an osteopathic assessment and consider cranial therapy for tx and will return for this -she will consider integrative medicine eval as well while continuing f/u with her specialist(s) -Patient advised to return or notify a doctor immediately if symptoms worsen or persist or new concerns arise.  Patient Instructions  BEFORE YOU LEAVE: -stool cards for blood to take home (NOT screening) -follow up: 30 minute osteopathic treatment appointment in next 4 weeks at end of clinic session  Stop supplements for now.   Do stool cards off iron.  Follow up sooner if worsening or new concerns.  Let your specialists know about any supplements.    Colin Benton R., DO

## 2017-02-19 ENCOUNTER — Other Ambulatory Visit (INDEPENDENT_AMBULATORY_CARE_PROVIDER_SITE_OTHER): Payer: Medicare Other

## 2017-02-19 ENCOUNTER — Telehealth: Payer: Self-pay | Admitting: *Deleted

## 2017-02-19 DIAGNOSIS — R195 Other fecal abnormalities: Secondary | ICD-10-CM | POA: Diagnosis not present

## 2017-02-19 LAB — POC HEMOCCULT BLD/STL (HOME/3-CARD/SCREEN)
Card #2 Fecal Occult Blod, POC: NEGATIVE
FECAL OCCULT BLD: NEGATIVE
Fecal Occult Blood, POC: NEGATIVE

## 2017-02-19 NOTE — Telephone Encounter (Signed)
Stephanie sent a message via Skype stating the pt dropped off stool cards for testing that were given last week and she needs an order.  I reviewed the office note from 6/18 and entered the order for stool cards.

## 2017-02-23 DIAGNOSIS — L570 Actinic keratosis: Secondary | ICD-10-CM | POA: Diagnosis not present

## 2017-02-23 DIAGNOSIS — X32XXXD Exposure to sunlight, subsequent encounter: Secondary | ICD-10-CM | POA: Diagnosis not present

## 2017-02-23 DIAGNOSIS — L820 Inflamed seborrheic keratosis: Secondary | ICD-10-CM | POA: Diagnosis not present

## 2017-02-23 DIAGNOSIS — L728 Other follicular cysts of the skin and subcutaneous tissue: Secondary | ICD-10-CM | POA: Diagnosis not present

## 2017-03-12 ENCOUNTER — Ambulatory Visit (INDEPENDENT_AMBULATORY_CARE_PROVIDER_SITE_OTHER): Payer: Medicare Other | Admitting: Family Medicine

## 2017-03-12 ENCOUNTER — Encounter: Payer: Self-pay | Admitting: Family Medicine

## 2017-03-12 VITALS — BP 102/60 | HR 85 | Temp 97.7°F | Ht 63.25 in | Wt 144.4 lb

## 2017-03-12 DIAGNOSIS — M9900 Segmental and somatic dysfunction of head region: Secondary | ICD-10-CM

## 2017-03-12 DIAGNOSIS — M9905 Segmental and somatic dysfunction of pelvic region: Secondary | ICD-10-CM

## 2017-03-12 DIAGNOSIS — M9902 Segmental and somatic dysfunction of thoracic region: Secondary | ICD-10-CM

## 2017-03-12 DIAGNOSIS — R202 Paresthesia of skin: Secondary | ICD-10-CM | POA: Diagnosis not present

## 2017-03-12 DIAGNOSIS — M9901 Segmental and somatic dysfunction of cervical region: Secondary | ICD-10-CM

## 2017-03-12 DIAGNOSIS — R42 Dizziness and giddiness: Secondary | ICD-10-CM | POA: Diagnosis not present

## 2017-03-12 DIAGNOSIS — R51 Headache: Secondary | ICD-10-CM | POA: Diagnosis not present

## 2017-03-12 DIAGNOSIS — R519 Headache, unspecified: Secondary | ICD-10-CM

## 2017-03-12 NOTE — Progress Notes (Signed)
HPI:  PMH migraines, vertigo, gerd, L ear pain, abd surgeries, tonsillectomy, wisom tooth abstraction in early 20s, exploratory surgery chest.  Main symptoms currently vertigo, intermittent headaches, head turns towards the right, tingling in both feet intermittently - this is chrornic, had neg NCT in the past per her report. She is seeing several specialist at [facility] including Neurology and ENT, med started for vestibular migraines, but so far not improving. She had a headache earlier this week now resolved. She has vertigo almost daily. Her vertigo and headaches started when she was in her 58s. She has had extensive evaluations and has tried a number of medications, prednisone, Topamax, propranolol, now zonisamide. She is interested in osteopathic treatment. Reports symptoms unchanged.  PSH and PMH reviewed and updated. Medication reviewed and updated for change.  ROS: See pertinent positives and negatives per HPI.  Past Medical History:  Diagnosis Date  . Arthritis   . Bronchitis    hx of when smoked   . Cancer ([place]) 04/16/09   kidney cancer - tx by [facility] per her report released from f/u  . Cataracts, bilateral   . Diverticulitis 11/2004  . Endometriosis 1994  . GERD (gastroesophageal reflux disease)    hx hiatal hernia, hx esophageal stricture s/p dilation  . Headache   . Hepatitis   . History of chicken pox   . History of hiatal hernia   . History of measles   . History of mumps as a child   . Hx of hepatitis    with cirrhosis - per her report from [place] and eval in 2013 at [place]  . Hyperlipidemia   . Left ear pain   . Migraines    pt states vesticular migraines has dizziness in relation   . Numbness and tingling    feet bilat has had for 40 years  . Right knee pain   . Scarlet fever   . Shingles   . Urinary tract bacterial infections    hx of  . Vertigo    associated with headache, intermittent, chronic    Past Surgical History:  Procedure Laterality  Date  . ABDOMINAL HYSTERECTOMY  1994   TAH/BSO due to endometriosis  . APPENDECTOMY    . CATARACT EXTRACTION W/ INTRAOCULAR LENS IMPLANT Bilateral 05/2015, 04/2015  . DIAGNOSTIC LAPAROSCOPY    . LAPAROSCOPIC PARTIAL COLECTOMY N/A 04/10/2015   Procedure: LAPAROSCOPIC PARTIAL CECTOMY;  Surgeon: Ralene Ok, MD;  Location: [facility];  Service: General;  Laterality: N/A;  . LUNG BIOPSY  1996   Negative  . NEPHRECTOMY  04/16/09   Partial removal due to cancer  . OOPHORECTOMY    . TONSILLECTOMY    . TONSILLECTOMY      Family History  Problem Relation Age of Onset  . Cancer Father   . Thyroid cancer Mother   . Cancer Mother        thyroid/ renal cell ca   . Colon cancer Neg Hx     Social History   Social History  . Marital status: Married    Spouse name: N/A  . Number of children: 0  . Years of education: N/A   Occupational History  . Retired Retired   Social History Main Topics  . Smoking status: Former Smoker    Packs/day: 1.50    Years: 20.00    Types: Cigarettes    Quit date: 08/25/1990  . Smokeless tobacco: Never Used  . Alcohol use No     Comment: Rare  . Drug  use: No  . Sexual activity: No     Comment: TAH   Other Topics Concern  . None   Social History Narrative  . None     Current Outpatient Prescriptions:  .  cycloSPORINE (RESTASIS) 0.05 % ophthalmic emulsion, Apply to eye 2 (two) times daily., Disp: , Rfl:  .  Multiple Vitamin (MULTIVITAMIN) tablet, Take 1 tablet by mouth daily.  , Disp: , Rfl:  .  omeprazole (PRILOSEC) 20 MG capsule, Take 1 capsule by mouth daily., Disp: , Rfl:  .  YUVAFEM 10 MCG TABS vaginal tablet, USE TWICE WEEKLY AS DIRECTED, Disp: 24 tablet, Rfl: 3 .  zonisamide (ZONEGRAN) 50 MG capsule, Take 100 mg by mouth daily., Disp: , Rfl:   EXAM:  Vitals:   03/12/17 1301  BP: 102/60  Pulse: 85  Temp: 97.7 F (36.5 C)    Body mass index is 25.38 kg/m.  GENERAL: vitals reviewed and listed above, alert, oriented, appears well  hydrated and in no acute distress, walks well with a cane.  HEENT: atraumatic, conjunttiva clear, no obvious abnormalities on inspection of external nose and ears  MS: Most prominent findings on musculoskeletal/osteopathic exam reveal: moves all extremities without noticeable abnormality. On standing exam L shoulder elevated, walks with cane. Lying - head rotated R, L chest elevated, R hip elevated, L foot more ext rotated. OA-Occiput restriction - decreased ext, rotated R, soboccipital muscle tension L cranial torsion Thoracic inlet rotated R L innominate ant R hip ext rotated  PSYCH: pleasant and cooperative, no obvious depression or anxiety  ASSESSMENT AND PLAN:  Discussed the following assessment and plan:  Vertigo  Frequent headaches  Paresthesias  Somatic dysfunction of head region  Cervical (neck) region somatic dysfunction  Somatic dysfunction of thoracic region  Somatic dysfunction of pelvic region  Somatic dysfunction of hip region  -explained OMT, risks, benefits, what to expect -treated areas above with sub occ inhibition, OCF, MFR, BMT -pt responded well, tolerated well -she asks if she can do another treatment - suggested 2-4 weeks between treatment -continue care with specialists -Patient advised to return or notify a doctor immediately if symptoms worsen or persist or new concerns arise.  There are no Patient Instructions on file for this visit.  Colin Benton R., DO

## 2017-03-16 ENCOUNTER — Telehealth: Payer: Self-pay | Admitting: Obstetrics and Gynecology

## 2017-03-16 NOTE — Telephone Encounter (Signed)
Left message on voicemail to call and reschedule cancelled appointment. Mail letter °

## 2017-03-27 ENCOUNTER — Encounter: Payer: Self-pay | Admitting: Family Medicine

## 2017-03-27 ENCOUNTER — Ambulatory Visit (INDEPENDENT_AMBULATORY_CARE_PROVIDER_SITE_OTHER): Payer: Medicare Other | Admitting: Family Medicine

## 2017-03-27 VITALS — BP 102/58 | HR 78 | Temp 97.8°F | Ht 63.25 in

## 2017-03-27 DIAGNOSIS — M9902 Segmental and somatic dysfunction of thoracic region: Secondary | ICD-10-CM | POA: Diagnosis not present

## 2017-03-27 DIAGNOSIS — R0981 Nasal congestion: Secondary | ICD-10-CM | POA: Diagnosis not present

## 2017-03-27 DIAGNOSIS — M9901 Segmental and somatic dysfunction of cervical region: Secondary | ICD-10-CM

## 2017-03-27 DIAGNOSIS — R42 Dizziness and giddiness: Secondary | ICD-10-CM | POA: Diagnosis not present

## 2017-03-27 DIAGNOSIS — H6983 Other specified disorders of Eustachian tube, bilateral: Secondary | ICD-10-CM

## 2017-03-27 DIAGNOSIS — M9900 Segmental and somatic dysfunction of head region: Secondary | ICD-10-CM | POA: Diagnosis not present

## 2017-03-27 NOTE — Progress Notes (Signed)
HPI:  Dove is a very pleasant 71 year old here for follow-up today. She has a long-standing history of migraines and vertigo for over 20 years, however they have worsened over the last several years. She was seeing a neurologist locally in the past and now is seeing a specialist at [facility], Dr. Alma Friendly. She also saw an ear nose and throat specialist at [facility]. She has long-standing issues with gait, walks with a cane, feels that her head tends to lean towards the right and has frequent spells of vertigo. He reports that her symptoms have been stable. She has had some sinus issues for the last few days, nasal congestion, sneezing, postnasal drip and sinus pressure around both eyes. She reports that sometimes sinus issues tend to worsen her vertigo symptoms. She had some vestibular rehabilitation in the past, but today she tells me she didn't complete that treatment and that it was mostly exercises to do at home. She has an appointment with her neurologist on Monday. She has been trying a new medication (zonisamide)for her symptoms, but this has not had any impact on her vertigo. She is requesting OMT today. The vertigo is usually triggered by movements, usually when she is laying down, but can occur at any time.  ROS: See pertinent positives and negatives per HPI.  Past Medical History:  Diagnosis Date  . Arthritis   . Bronchitis    hx of when smoked   . Cancer ([place]) 04/16/09   kidney cancer - tx by [facility] per her report released from f/u  . Cataracts, bilateral   . Diverticulitis 11/2004  . Endometriosis 1994  . GERD (gastroesophageal reflux disease)    hx hiatal hernia, hx esophageal stricture s/p dilation  . Headache   . Hepatitis   . History of chicken pox   . History of hiatal hernia   . History of measles   . History of mumps as a child   . Hx of hepatitis    with cirrhosis - per her report from [place] and eval in 2013 at [place]  . Hyperlipidemia   . Left ear pain    . Migraines    pt states vesticular migraines has dizziness in relation   . Numbness and tingling    feet bilat has had for 40 years  . Right knee pain   . Scarlet fever   . Shingles   . Urinary tract bacterial infections    hx of  . Vertigo    associated with headache, intermittent, chronic    Past Surgical History:  Procedure Laterality Date  . ABDOMINAL HYSTERECTOMY  1994   TAH/BSO due to endometriosis  . APPENDECTOMY    . CATARACT EXTRACTION W/ INTRAOCULAR LENS IMPLANT Bilateral 05/2015, 04/2015  . DIAGNOSTIC LAPAROSCOPY    . LAPAROSCOPIC PARTIAL COLECTOMY N/A 04/10/2015   Procedure: LAPAROSCOPIC PARTIAL CECTOMY;  Surgeon: Ralene Ok, MD;  Location: [facility];  Service: General;  Laterality: N/A;  . LUNG BIOPSY  1996   Negative  . NEPHRECTOMY  04/16/09   Partial removal due to cancer  . OOPHORECTOMY    . TONSILLECTOMY    . TONSILLECTOMY      Family History  Problem Relation Age of Onset  . Cancer Father   . Thyroid cancer Mother   . Cancer Mother        thyroid/ renal cell ca   . Colon cancer Neg Hx     Social History   Social History  . Marital status: Married  Spouse name: N/A  . Number of children: 0  . Years of education: N/A   Occupational History  . Retired Retired   Social History Main Topics  . Smoking status: Former Smoker    Packs/day: 1.50    Years: 20.00    Types: Cigarettes    Quit date: 08/25/1990  . Smokeless tobacco: Never Used  . Alcohol use No     Comment: Rare  . Drug use: No  . Sexual activity: No     Comment: TAH   Other Topics Concern  . None   Social History Narrative  . None     Current Outpatient Prescriptions:  .  cycloSPORINE (RESTASIS) 0.05 % ophthalmic emulsion, Apply to eye 2 (two) times daily., Disp: , Rfl:  .  Multiple Vitamin (MULTIVITAMIN) tablet, Take 1 tablet by mouth daily.  , Disp: , Rfl:  .  omeprazole (PRILOSEC) 20 MG capsule, Take 1 capsule by mouth daily., Disp: , Rfl:  .  YUVAFEM 10 MCG TABS  vaginal tablet, USE TWICE WEEKLY AS DIRECTED, Disp: 24 tablet, Rfl: 3 .  zonisamide (ZONEGRAN) 50 MG capsule, Take 100 mg by mouth daily., Disp: , Rfl:   EXAM:  Vitals:   03/27/17 1315  BP: (!) 102/58  Pulse: 78  Temp: 97.8 F (36.6 C)    There is no height or weight on file to calculate BMI.  GENERAL: vitals reviewed and listed above, alert, oriented, appears well hydrated and in no acute distress  HEENT: atraumatic, conjunttiva clear, no obvious abnormalities on inspection of external nose and ears, normal appearance of ear canals and TMs - clear effusions bilat, clear nasal congestion, mild post oropharyngeal erythema with PND, no tonsillar edema or exudate, no sinus TTP  NECK: no obvious masses on inspection, no carotid bruit - sig stronger carotid and post vetebral art pulse on the R  compared to the L  LUNGS: clear to auscultation bilaterally, no wheezes, rales or rhonchi, good air movement  CV: HRRR, no peripheral edema  MS: moves all extremities without noticeable abnormality: T 6-10 RrSl Suboccipital muscle tension and spasm R > L C 2-4 FSrRr L rib/thor elevated R Temp int R Restricted and tight Fascia R ear  PSYCH/NEURO: pleasant and cooperative, no obvious depression or anxiety, CN II-XII groally intact, finger to nose normal, walks with cane, slow cautious gait  ASSESSMENT AND PLAN:  Discussed the following assessment and plan:  Vertigo  Sinus congestion  Dysfunction of both eustachian tubes  Somatic dysfunction of head region  Somatic dysfunction of cervical region  Somatic dysfunction of spine, thoracic  -Her sinus issues may be from allergic rhinitis or an upper respiratory infection, she does have yeast patient tube dysfunction and we addressed this with OMT today - I'm not sure that she has ever had any vascular imaging of her carotids, suggested she may want to discuss this with her neurologist on Monday to see if this may be something that should  be done in light of her persistent and worsening vertiginous episodes - offered to order carotid duplex or MRA here if neurology does not order -she plans to discuss with her neurologist -We also talked about trying vestibular rehabilitation again with one of our vestibular therapist to assist with her gait and balance, she plans to consider will let us know if she she would like for [place] to order this -Osteopathic treatment of the thoracic inlet, thoracic spine, cervical spine, and head today using muscle energy, myofascial release and osteopathy  in the cranial field techniques - we only used very gentle soft tissue approaches in the cervical region in case she has any underlying undiagnosed vascular lesions - we did not use any HVLA or techniques that would be contraindicated if any underlying vascular issues in the neck or carotid arteries -She requests a follow-up in 2 weeks for further OMT -Patient advised to return or notify a doctor immediately if symptoms worsen or persist or new concerns arise.  Patient Instructions  Consider vestibular rehab again.  Discuss possibility of look at carotid art with specialist - did you have MRA or carotid US?  Follow up in about 2 weeks (30 minute at beginning or end of Dr. Julianne Rice schedule for OMT)     Lucretia Kern., DO

## 2017-03-27 NOTE — Patient Instructions (Addendum)
Consider vestibular rehab again.  Discuss possibility of look at carotid art with specialist - did you have MRA or carotid US?  Follow up in about 2 weeks (30 minute at beginning or end of Dr. Julianne Rice schedule for OMT)

## 2017-03-30 DIAGNOSIS — R42 Dizziness and giddiness: Secondary | ICD-10-CM | POA: Diagnosis not present

## 2017-03-30 DIAGNOSIS — G609 Hereditary and idiopathic neuropathy, unspecified: Secondary | ICD-10-CM | POA: Diagnosis not present

## 2017-03-30 DIAGNOSIS — R292 Abnormal reflex: Secondary | ICD-10-CM | POA: Diagnosis not present

## 2017-04-01 DIAGNOSIS — K7460 Unspecified cirrhosis of liver: Secondary | ICD-10-CM | POA: Diagnosis not present

## 2017-04-01 DIAGNOSIS — Z86010 Personal history of colonic polyps: Secondary | ICD-10-CM | POA: Diagnosis not present

## 2017-04-01 DIAGNOSIS — K219 Gastro-esophageal reflux disease without esophagitis: Secondary | ICD-10-CM | POA: Diagnosis not present

## 2017-04-16 ENCOUNTER — Ambulatory Visit (INDEPENDENT_AMBULATORY_CARE_PROVIDER_SITE_OTHER): Payer: Medicare Other | Admitting: Family Medicine

## 2017-04-16 ENCOUNTER — Encounter: Payer: Self-pay | Admitting: Family Medicine

## 2017-04-16 VITALS — BP 124/80 | HR 77 | Temp 97.5°F | Ht 63.25 in | Wt 145.2 lb

## 2017-04-16 DIAGNOSIS — Z808 Family history of malignant neoplasm of other organs or systems: Secondary | ICD-10-CM | POA: Diagnosis not present

## 2017-04-16 DIAGNOSIS — M9905 Segmental and somatic dysfunction of pelvic region: Secondary | ICD-10-CM

## 2017-04-16 DIAGNOSIS — M9901 Segmental and somatic dysfunction of cervical region: Secondary | ICD-10-CM

## 2017-04-16 DIAGNOSIS — M9900 Segmental and somatic dysfunction of head region: Secondary | ICD-10-CM | POA: Diagnosis not present

## 2017-04-16 DIAGNOSIS — M9902 Segmental and somatic dysfunction of thoracic region: Secondary | ICD-10-CM

## 2017-04-16 DIAGNOSIS — E0789 Other specified disorders of thyroid: Secondary | ICD-10-CM | POA: Diagnosis not present

## 2017-04-16 DIAGNOSIS — M9906 Segmental and somatic dysfunction of lower extremity: Secondary | ICD-10-CM | POA: Diagnosis not present

## 2017-04-16 DIAGNOSIS — R42 Dizziness and giddiness: Secondary | ICD-10-CM | POA: Diagnosis not present

## 2017-04-16 NOTE — Patient Instructions (Signed)
BEFORE YOU LEAVE: -follow up: OMT in 1-2 weeks (30 minutes) - first or last  30 minutes of clinic session  -We placed a referral for you as discussed for vestibular rehab and for the ultrasound. It usually takes about 1-2 weeks to process and schedule this referral. If you have not heard from [place] regarding this appointment in 2 weeks please contact our office.

## 2017-04-16 NOTE — Progress Notes (Signed)
HPI:  Brenda [name] is a very pleasant 71 year old with a history of chronic gait problems and vertigo that started many many years ago here for follow-up. She also has an on issue of her head cocked in to the right and occasionally bounce issues with straining towards the right. She is seeing a neurologist at [facility] about this. She has seen several specialists and has been diagnosed with atypical migraines. However she reports the neurologist she is seen thinks that it is chronic subjective dizziness versus vestibular migraine and he also thinks she may have a peripheral neuropathy. She'll be doing nerve conduction testing. Reports he told her he does not feel that imaging of the vessels in her neck as needed. Reports neurologist thought that vestibular rehabilitation would be a good idea and that she would like a referral in [place] for this. she apparently did vestibular rehabilitation in the past in [place] and was not happy with the therapist. Overall since her last treatment, she feels symptoms have improved. Her dizziness has decreased some. She feels that her neck and no longer sways to the right is much. She does occasionally feel like she sways to the right when she is walking. And that sometimes her right leg across over her left leg. This has been going on for over 20 years.her neurologist recently took her off of ZNS and started Effexor She also has a new concern today. She has been feeling her and thyroid and wants to evaluate for thyroid cancer. She hasn't really noticed a change in her thyroid, but has a family history of thyroid so this has been concerning to her.no weight changes, fevers or malaise.  ROS: See pertinent positives and negatives per HPI.  Past Medical History:  Diagnosis Date  . Arthritis   . Bronchitis    hx of when smoked   . Cancer ([place]) 04/16/09   kidney cancer - [facility] by [facility] per her report released from f/u  . Cataracts, bilateral   . Diverticulitis 11/2004   . Endometriosis 1994  . GERD (gastroesophageal reflux disease)    hx hiatal hernia, hx esophageal stricture s/p dilation  . Headache   . Hepatitis   . History of chicken pox   . History of hiatal hernia   . History of measles   . History of mumps as a child   . Hx of hepatitis    with cirrhosis - per her report from [place] and eval in 2013 at [place]  . Hyperlipidemia   . Left ear pain   . Migraines    pt states vesticular migraines has dizziness in relation   . Numbness and tingling    feet bilat has had for 40 years  . Right knee pain   . Scarlet fever   . Shingles   . Urinary tract bacterial infections    hx of  . Vertigo    associated with headache, intermittent, chronic    Past Surgical History:  Procedure Laterality Date  . ABDOMINAL HYSTERECTOMY  1994   TAH/BSO due to endometriosis  . APPENDECTOMY    . CATARACT EXTRACTION W/ INTRAOCULAR LENS IMPLANT Bilateral 05/2015, 04/2015  . DIAGNOSTIC LAPAROSCOPY    . LAPAROSCOPIC PARTIAL COLECTOMY N/A 04/10/2015   Procedure: LAPAROSCOPIC PARTIAL CECTOMY;  Surgeon: Ralene Ok, MD;  Location: [facility];  Service: General;  Laterality: N/A;  . LUNG BIOPSY  1996   Negative  . NEPHRECTOMY  04/16/09   Partial removal due to cancer  . OOPHORECTOMY    .  TONSILLECTOMY    . TONSILLECTOMY      Family History  Problem Relation Age of Onset  . Cancer Father   . Thyroid cancer Mother   . Cancer Mother        thyroid/ renal cell ca   . Colon cancer Neg Hx     Social History   Social History  . Marital status: Married    Spouse name: N/A  . Number of children: 0  . Years of education: N/A   Occupational History  . Retired Retired   Social History Main Topics  . Smoking status: Former Smoker    Packs/day: 1.50    Years: 20.00    Types: Cigarettes    Quit date: 08/25/1990  . Smokeless tobacco: Never Used  . Alcohol use No     Comment: Rare  . Drug use: No  . Sexual activity: No     Comment: TAH   Other Topics  Concern  . None   Social History Narrative  . None     Current Outpatient Prescriptions:  .  cycloSPORINE (RESTASIS) 0.05 % ophthalmic emulsion, Apply to eye 2 (two) times daily., Disp: , Rfl:  .  Multiple Vitamin (MULTIVITAMIN) tablet, Take 1 tablet by mouth daily.  , Disp: , Rfl:  .  omeprazole (PRILOSEC) 20 MG capsule, Take 1 capsule by mouth daily., Disp: , Rfl:  .  venlafaxine XR (EFFEXOR-XR) 75 MG 24 hr capsule, Take 75 mg by mouth daily., Disp: , Rfl: 3 .  YUVAFEM 10 MCG TABS vaginal tablet, USE TWICE WEEKLY AS DIRECTED, Disp: 24 tablet, Rfl: 3  EXAM:  Vitals:   04/16/17 1611  BP: 124/80  Pulse: 77  Temp: (!) 97.5 F (36.4 C)    Body mass index is 25.52 kg/m.  GENERAL: vitals reviewed and listed above, alert, oriented, appears well hydrated and in no acute distress  HEENT: atraumatic, conjunttiva clear, no obvious abnormalities on inspection of external nose and ears  NECK: no obvious masses on inspection, possible mild homogenous fullness of the thyroid on palpation, normal range of motion of the head and neck, negative vertebral artery insufficiency test, negative Spurling  LUNGS: clear to auscultation bilaterally, no wheezes, rales or rhonchi, good air movement  CV: HRRR, no peripheral edema  MS: moves all extremities without noticeable abnormality, her left shoulder is slightly higher than the right, today her neck and head appear much more straight than in the past, on standing posture right knee is bowed in with valgus displacement of the right lower leg and foot, chest significant suboccipital muscle tension with OA extended side bent right rotated left, There is a leg length discrepancy, C3 extended rotated right side bent right, thoracic outlet rotated right right anterior innominate, negative cervical compression testing, negative vertebral artery challenge test, preserved range of motion in the head and neck  PSYCH: pleasant and cooperative, no obvious  depression or anxiety  ASSESSMENT AND PLAN:  Discussed the following assessment and plan:  Vertigo - Plan: Ambulatory referral to Physical Therapy -Seeing specialist for management, trying Effexor and is going to have nerve conduction testing -Placed a referral for vestibular rehabilitation -Seems to be improving and she wishes to continue osteopathic treatments   FH: thyroid cancer - Plan: US Soft Tissue Head/Neck Thyroid fullness -we'll get a thyroid ultrasound  Somatic dysfunction of head region Somatic dysfunction of cervical region Somatic dysfunction of thoracic region Somatic dysfunction of pelvis region Somatic dysfunction of lower extremity PROCEDURE NOTE : OSTEOPATHIC MANIPULATION  The decision today to treat with gentle Osteopathic Manipulative Therapy  (OMT) was based on physical exam findings. Verbal consent was  obtained after after explanation of risks and benefits. No Cervical HVLA manipulation was performed. After consent was obtained, treatment was  performed as below:      Regions treated: cervical, thoracic, pelvic, lower extremity     Techniques used: MR, ME, suboccipital inhibition, BMT The patient tolerated the treatment well and reported Improved  symptoms following treatment today. Follow up treatment was advised in: 1-2 weeks  -Patient advised to return or notify a doctor immediately if symptoms worsen or persist or new concerns arise.  Patient Instructions  BEFORE YOU LEAVE: -follow up: OMT in 1-2 weeks (30 minutes) - first or last  30 minutes of clinic session  -We placed a referral for you as discussed for vestibular rehab and for the ultrasound. It usually takes about 1-2 weeks to process and schedule this referral. If you have not heard from [place] regarding this appointment in 2 weeks please contact our office.     Colin Benton R., DO

## 2017-04-28 ENCOUNTER — Ambulatory Visit
Admission: RE | Admit: 2017-04-28 | Discharge: 2017-04-28 | Disposition: A | Discharge status: Home / Self Care | Payer: Medicare Other | Source: Ambulatory Visit | Attending: Family Medicine | Admitting: Family Medicine

## 2017-04-28 ENCOUNTER — Other Ambulatory Visit: Payer: Self-pay | Admitting: Family Medicine

## 2017-04-28 DIAGNOSIS — Z808 Family history of malignant neoplasm of other organs or systems: Secondary | ICD-10-CM

## 2017-04-28 DIAGNOSIS — E042 Nontoxic multinodular goiter: Secondary | ICD-10-CM | POA: Diagnosis not present

## 2017-05-01 ENCOUNTER — Ambulatory Visit: Payer: Medicare Other | Admitting: Family Medicine

## 2017-05-01 DIAGNOSIS — G609 Hereditary and idiopathic neuropathy, unspecified: Secondary | ICD-10-CM | POA: Diagnosis not present

## 2017-05-05 ENCOUNTER — Ambulatory Visit: Payer: Medicare Other | Attending: Family Medicine | Admitting: Physical Therapy

## 2017-05-05 DIAGNOSIS — R2689 Other abnormalities of gait and mobility: Secondary | ICD-10-CM | POA: Diagnosis not present

## 2017-05-05 DIAGNOSIS — R2681 Unsteadiness on feet: Secondary | ICD-10-CM | POA: Diagnosis not present

## 2017-05-05 DIAGNOSIS — R42 Dizziness and giddiness: Secondary | ICD-10-CM | POA: Diagnosis not present

## 2017-05-05 NOTE — Patient Instructions (Signed)
Gaze Stabilization: Tip Card  1.Target must remain in focus, not blurry, and appear stationary while head is in motion. 2.Perform exercises with small head movements (45 to either side of midline). 3.Increase speed of head motion so long as target is in focus. 4.If you wear eyeglasses, be sure you can see target through lens (therapist will give specific instructions for bifocal / progressive lenses). 5.These exercises may provoke dizziness or nausea. Work through these symptoms. If too dizzy, slow head movement slightly. Rest between each exercise. 6.Exercises demand concentration; avoid distractions. 7.For safety, perform standing exercises close to a counter, wall, corner, or next to someone.  Copyright  VHI. All rights reserved.  Gaze Stabilization: Standing Feet Apart    Feet shoulder width apart, keeping eyes on target on wall __6__ feet away, tilt head down 15-30 and move head side to side for _60___ seconds. Repeat while moving head up and down for _60__ seconds. Do _3_ sessions per day.  CAN DO SEATED POSITION FOR VERTICAL  Copyright  VHI. All rights reserved.

## 2017-05-06 NOTE — Therapy (Signed)
[facility] [address] [place], [place], [address] Phone: [phone]   Fax:  [phone]  Physical Therapy Evaluation  Patient Details  Name: Brenda [name] MRN: [id number] Date of Birth: [birth year] Referring Provider: Colin Benton, DO  Encounter Date: 05/05/2017      PT End of Session - 05/06/17 2044    Visit Number 1   Number of Visits 9   Date for PT Re-Evaluation 06/04/17   Authorization Type MCR   Authorization Time Period 05-05-17 - 07-04-17   PT Start Time 1402   PT Stop Time 1451   PT Time Calculation (min) 49 min      Past Medical History:  Diagnosis Date  . Arthritis   . Bronchitis    hx of when smoked   . Cancer ([place]) 04/16/09   kidney cancer - tx by [facility] per her report released from f/u  . Cataracts, bilateral   . Diverticulitis 11/2004  . Endometriosis 1994  . GERD (gastroesophageal reflux disease)    hx hiatal hernia, hx esophageal stricture s/p dilation  . Headache   . Hepatitis   . History of chicken pox   . History of hiatal hernia   . History of measles   . History of mumps as a child   . Hx of hepatitis    with cirrhosis - per her report from [place] and eval in 2013 at [place]  . Hyperlipidemia   . Left ear pain   . Migraines    pt states vesticular migraines has dizziness in relation   . Numbness and tingling    feet bilat has had for 40 years  . Right knee pain   . Scarlet fever   . Shingles   . Urinary tract bacterial infections    hx of  . Vertigo    associated with headache, intermittent, chronic    Past Surgical History:  Procedure Laterality Date  . ABDOMINAL HYSTERECTOMY  1994   TAH/BSO due to endometriosis  . APPENDECTOMY    . CATARACT EXTRACTION W/ INTRAOCULAR LENS IMPLANT Bilateral 05/2015, 04/2015  . DIAGNOSTIC LAPAROSCOPY    . LAPAROSCOPIC PARTIAL COLECTOMY N/A 04/10/2015   Procedure: LAPAROSCOPIC PARTIAL CECTOMY;  Surgeon: Ralene Ok, MD;   Location: [facility];  Service: General;  Laterality: N/A;  . LUNG BIOPSY  1996   Negative  . NEPHRECTOMY  04/16/09   Partial removal due to cancer  . OOPHORECTOMY    . TONSILLECTOMY    . TONSILLECTOMY      There were no vitals filed for this visit.       Subjective Assessment - 05/06/17 2029    Subjective Pt reports she has had constant vertigo since Oct. 2017: had intermittent vertigo episodes that previously lasted about 5 days; pt reports dizziness at this time - feels pressure in her head and feels something is moving in her head; pt has been using [facility] since Oct. 2017:     Pertinent History idiopathic peripheral neuropathy; pt was prescribed medication for migraines but states medication did not help; pt has sensorineural hearing loss (initally labyrinthitis? with sequelae?); h/o kidney cancer; chronic headaches   Patient Stated Goals "would like to be comfortable enough to not have to use cane and be self sufficient"            [facility] PT Assessment - 05/06/17 0001      Assessment   Medical Diagnosis Vertigo   Referring Provider Colin Benton, DO  Onset Date/Surgical Date --  Oct. 2017     Precautions   Precautions Fall     Balance Screen   Has the patient fallen in the past 6 months No   Has the patient had a decrease in activity level because of a fear of falling?  Yes   Is the patient reluctant to leave their home because of a fear of falling?  Yes     [place] residence   Type of [facility] Access Stairs to enter   Entrance Stairs-Number of Steps 10-12   Entrance Stairs-Rails Can reach both   Home Layout Two level     Ambulation/Gait   Ambulation/Gait Yes   Ambulation/Gait Assistance 6: Modified independent (Device/Increase time)   Ambulation Distance (Feet) 75 Feet   Assistive device Straight cane   Gait velocity 2.52 ft/sec with SPC:  (13 secs)     Standardized Balance Assessment   Standardized Balance Assessment  Timed Up and Go Test     Timed Up and Go Test   Normal TUG (seconds) 15.19  with [facility]            Vestibular Assessment - 05/06/17 0001      Vestibular Assessment   General Observation Pt is a 71 yr old lady with c/o constant vertigo since Oct. 2017.  Pt states she had episode while driving home in Oct. 2017 and in the past the episodes have lasted approx. 5 days; she states this episode of dizziness has continued since that time but has improved with less intensity.       Symptom Behavior   Type of Dizziness "Funny feeling in head"   Frequency of Dizziness every day   Duration of Dizziness constant   Aggravating Factors Activity in general   Relieving Factors Rest;Slow movements;Head stationary     Occulomotor Exam   Occulomotor Alignment Normal     Visual Acuity   Static line 10   Dynamic line 7        Objective measurements completed on examination: See above findings.                  PT Education - 05/06/17 2043    Education provided Yes   Education Details x1 viewing - seated for vertical:  standing for horizontal   Person(s) Educated Patient   Methods Explanation;Demonstration;Handout   Comprehension Verbalized understanding;Returned demonstration             PT Long Term Goals - 05/06/17 2055      PT LONG TERM GOAL #1   Title Improve TUG score to </= 13.0 secs with cane for reduced fall risk.   Baseline 15.19 secs   Time 4   Period Weeks   Status New   Target Date 06/04/17     PT LONG TERM GOAL #2   Title Improve DVA to </= 2 line difference for improved gaze stabilization.   Baseline 3 line difference   Time 4   Period Weeks   Status New   Target Date 06/04/17     PT LONG TERM GOAL #3   Title Increase gait velocity from 2.52 to >/= 3.1 ft/sec with cane for incr. gait efficiency.   Baseline 13.00 secs   Time 4   Period Weeks   Status New   Target Date 06/04/17     PT LONG TERM GOAL #4   Title Pt will report at least 25%  improvement  in vertigo.   Time 4   Period Weeks   Status New   Target Date 06/04/17     PT LONG TERM GOAL #5   Title Independent in HEP for balance and vestibular exercises.   Time 4   Period Weeks   Status New   Target Date 06/04/17                Plan - 05/06/17 December 02, 2043    Clinical Impression Statement Pt is a 71 yr old lady with c/o constant vertigo since Oct. 2017 in which vertigo occurred while she was driving and pt reeports it has been constant since that time.  Pt presents with decr. balance and decr. vestibular function in maintaining balance as evidenced by inability to stand with EC and inability to amb. with head turns.  Pt is at mod. risk for fall per TUG score of 15.19 secs with use of SPC.  Pt has decreased VOR function with DVA 3 line difference.  PMH includes idiopathic peripheral neuropathy, migraines, sensorineural hearing loss.  PT will benefit from PT to address balance and vestibular and gait deficits.                                                                                                                                                                                                                                             History and Personal Factors relevant to plan of care: idiopathic peripheral neuropathy, sensorineural hearing loss   Clinical Presentation Evolving   Clinical Decision Making Moderate   Rehab Potential Good   PT Frequency 2x / week   PT Duration 4 weeks   PT Treatment/Interventions ADLs/Self Care Home Management;Gait training;Stair training;Therapeutic activities;Therapeutic exercise;Balance training;Neuromuscular re-education;Patient/family education;Vestibular   PT Next Visit Plan check x1 viewing exercise; SOT   PT Home Exercise Plan x1 viewing   Consulted and Agree with Plan of Care Patient      Patient will benefit from skilled therapeutic intervention in order to improve the following deficits and impairments:  Abnormal  gait, Decreased balance, Difficulty walking, Dizziness  Visit Diagnosis: Dizziness and giddiness - Plan: PT plan of care cert/re-cert  Other abnormalities of gait and mobility - Plan: PT plan of care cert/re-cert  Unsteadiness on feet - Plan: PT plan of care cert/re-cert      G-Codes - 19/62/22 12/01/00    Functional Assessment  Tool Used (Outpatient Only) TUG score 15.19 secs :  gait velocity 2.54 ft/sec;  DVA 3 line difference; c/o constant vertigo   Functional Limitation Mobility: Walking and moving around   Mobility: Walking and Moving Around Current Status [phone]) At least 40 percent but less than 60 percent impaired, limited or restricted   Mobility: Walking and Moving Around Goal Status [phone]) At least 1 percent but less than 20 percent impaired, limited or restricted       Problem List Patient Active Problem List   Diagnosis Date Noted  . S/P partial resection of colon 04/10/2015  . Cirrhosis ([place]) 12/08/2014  . Esophageal reflux 12/08/2014  . Esophageal stricture 12/08/2014  . Vertigo 12/08/2014    Alda Lea, PT 05/06/2017, 9:06 PM  [facility] [address] [place], [place], [address] Phone: [phone]   Fax:  [phone]  Name: SAMAI COREA MRN: [id number] Date of Birth: 08-10-1946

## 2017-05-08 ENCOUNTER — Ambulatory Visit (INDEPENDENT_AMBULATORY_CARE_PROVIDER_SITE_OTHER): Payer: Medicare Other | Admitting: Family Medicine

## 2017-05-08 ENCOUNTER — Ambulatory Visit: Payer: Medicare Other | Admitting: Family Medicine

## 2017-05-08 ENCOUNTER — Encounter: Payer: Self-pay | Admitting: Family Medicine

## 2017-05-08 VITALS — BP 100/74 | HR 80 | Temp 97.7°F | Ht 63.25 in

## 2017-05-08 DIAGNOSIS — M9900 Segmental and somatic dysfunction of head region: Secondary | ICD-10-CM

## 2017-05-08 DIAGNOSIS — R7989 Other specified abnormal findings of blood chemistry: Secondary | ICD-10-CM

## 2017-05-08 DIAGNOSIS — E538 Deficiency of other specified B group vitamins: Secondary | ICD-10-CM

## 2017-05-08 DIAGNOSIS — R269 Unspecified abnormalities of gait and mobility: Secondary | ICD-10-CM | POA: Diagnosis not present

## 2017-05-08 DIAGNOSIS — M9901 Segmental and somatic dysfunction of cervical region: Secondary | ICD-10-CM | POA: Diagnosis not present

## 2017-05-08 DIAGNOSIS — G629 Polyneuropathy, unspecified: Secondary | ICD-10-CM

## 2017-05-08 DIAGNOSIS — R739 Hyperglycemia, unspecified: Secondary | ICD-10-CM

## 2017-05-08 DIAGNOSIS — E049 Nontoxic goiter, unspecified: Secondary | ICD-10-CM

## 2017-05-08 DIAGNOSIS — E559 Vitamin D deficiency, unspecified: Secondary | ICD-10-CM | POA: Diagnosis not present

## 2017-05-08 DIAGNOSIS — E569 Vitamin deficiency, unspecified: Secondary | ICD-10-CM

## 2017-05-08 DIAGNOSIS — R42 Dizziness and giddiness: Secondary | ICD-10-CM | POA: Diagnosis not present

## 2017-05-08 DIAGNOSIS — E785 Hyperlipidemia, unspecified: Secondary | ICD-10-CM

## 2017-05-08 DIAGNOSIS — M9906 Segmental and somatic dysfunction of lower extremity: Secondary | ICD-10-CM

## 2017-05-08 DIAGNOSIS — M9902 Segmental and somatic dysfunction of thoracic region: Secondary | ICD-10-CM

## 2017-05-08 NOTE — Progress Notes (Signed)
HPI:  Brenda [name] Visit very pleasant 71 year old with a long-standing history of vertigo and gait abnormality, seeing a neurologist for this, here for osteopathic treatment. She reports her neurologist put her on a new medication, Effexor, and she is feeling somewhat better.over the course of several osteopathic treatment sessions, her symptom of her head legging towards the right has improved, she feels her gait has improved some, and today she feels like the Effexor may be helping her vertigo.  She did see the physical therapist we referred her to and feels very good about this. She also had nerve conduction testing done recently which did show peripheral sensory neuropathy. She has not had follow-up with her neurologist yet to go over these results. She read the report and it did mention causes of this could include hypovitaminosis, hyperglycemia, cancer, muscle disorders and other systemic disorders. Her father had multiple myeloma.  She had a thyroid goiter on her ultrasound. Her mother had thyroid cancer.  ROS: See pertinent positives and negatives per HPI.  Past Medical History:  Diagnosis Date  . Arthritis   . Bronchitis    hx of when smoked   . Cancer ([place]) 04/16/09   kidney cancer - [facility] per her report released from f/u  . Cataracts, bilateral   . Diverticulitis 11/2004  . Endometriosis 1994  . GERD (gastroesophageal reflux disease)    hx hiatal hernia, hx esophageal stricture s/p dilation  . Headache   . Hepatitis   . History of chicken pox   . History of hiatal hernia   . History of measles   . History of mumps as a child   . Hx of hepatitis    with cirrhosis - per her report from [place] and eval in 2013 at [place]  . Hyperlipidemia   . Left ear pain   . Migraines    pt states vesticular migraines has dizziness in relation   . Numbness and tingling    feet bilat has had for 40 years  . Right knee pain   . Scarlet fever   . Shingles   . Urinary  tract bacterial infections    hx of  . Vertigo    associated with headache, intermittent, chronic    Past Surgical History:  Procedure Laterality Date  . ABDOMINAL HYSTERECTOMY  1994   TAH/BSO due to endometriosis  . APPENDECTOMY    . CATARACT EXTRACTION W/ INTRAOCULAR LENS IMPLANT Bilateral 05/2015, 04/2015  . DIAGNOSTIC LAPAROSCOPY    . LAPAROSCOPIC PARTIAL COLECTOMY N/A 04/10/2015   Procedure: LAPAROSCOPIC PARTIAL CECTOMY;  Surgeon: Ralene Ok, MD;  Location: [facility];  Service: General;  Laterality: N/A;  . LUNG BIOPSY  1996   Negative  . NEPHRECTOMY  04/16/09   Partial removal due to cancer  . OOPHORECTOMY    . TONSILLECTOMY    . TONSILLECTOMY      Family History  Problem Relation Age of Onset  . Cancer Father   . Thyroid cancer Mother   . Cancer Mother        thyroid/ renal cell ca   . Colon cancer Neg Hx     Social History   Social History  . Marital status: Married    Spouse name: N/A  . Number of children: 0  . Years of education: N/A   Occupational History  . Retired Retired   Social History Main Topics  . Smoking status: Former Smoker    Packs/day: 1.50    Years: 20.00  Types: Cigarettes    Quit date: 08/25/1990  . Smokeless tobacco: Never Used  . Alcohol use No     Comment: Rare  . Drug use: No  . Sexual activity: No     Comment: TAH   Other Topics Concern  . None   Social History Narrative  . None     Current Outpatient Prescriptions:  .  cycloSPORINE (RESTASIS) 0.05 % ophthalmic emulsion, Apply to eye 2 (two) times daily., Disp: , Rfl:  .  Multiple Vitamin (MULTIVITAMIN) tablet, Take 1 tablet by mouth daily.  , Disp: , Rfl:  .  omeprazole (PRILOSEC) 20 MG capsule, Take 1 capsule by mouth daily., Disp: , Rfl:  .  venlafaxine XR (EFFEXOR-XR) 75 MG 24 hr capsule, Take 75 mg by mouth daily., Disp: , Rfl: 3 .  YUVAFEM 10 MCG TABS vaginal tablet, USE TWICE WEEKLY AS DIRECTED, Disp: 24 tablet, Rfl: 3  EXAM:  Vitals:   05/08/17 1046   BP: 100/74  Pulse: 80  Temp: 97.7 F (36.5 C)    There is no height or weight on file to calculate BMI.  GENERAL: vitals reviewed and listed above, alert, oriented, appears well hydrated and in no acute distress  HEENT: atraumatic, conjunttiva clear, no obvious abnormalities on inspection of external nose and ears  NECK: no obvious masses on inspection  LUNGS: clear to auscultation bilaterally, no wheezes, rales or rhonchi, good air movement  CV: HRRR, no peripheral edema  MS: Walks with a cane, slow and cautious gate, osteopathic findings include a right SBR cranial strain pattern, suboccipital muscle tension, her shoulder height discrepancy is improved, thoracic inlet rotated right,thoracic paraspinal muscle tension and restriction in inhalation of the thoracic rib cage, pelvis is much straighter today than in the past, right tib-fib and ankle rotated internally, with supination of the right foot  PSYCH: pleasant and cooperative, no obvious depression or anxiety  ASSESSMENT AND PLAN:  Discussed the following assessment and plan:  Vertigo  Gait abnormality  Polyneuropathy - Plan: CBC with Differential/Platelet, Comprehensive metabolic panel  Goiter - Plan: TSH  Hyperlipidemia, unspecified hyperlipidemia type - Plan: Lipid panel  Hypovitaminosis  Abnormal CBC  Vitamin D deficiency - Plan: VITAMIN D 25 Hydroxy (Vit-D Deficiency, Fractures)  Hyperglycemia - Plan: Hemoglobin A1c  B12 deficiency - Plan: Vitamin B12  -we'll order labs per above, she wanted to try lifestyle changes for cholesterol problems earlier this spring, will return for fasting labs -We'll check a TSH, given the goiter, also hemoglobin A1c, B12, CBC and vitamin D -She may consider seeing a hematologist CBC abnormal -She also will have follow-up with her neurologist regarding the nerve conduction study for further review and evaluation -I am glad she is starting to feel better -she will continue  physical therapy as planned -Osteopathic treatment per below PROCEDURE NOTE : OSTEOPATHIC TREATMENT The decision today to treat with gentle Osteopathic Manipulative Therapy  (OMT) was based on physical exam findings. Verbal consent was  obtained after after explanation of risks and benefits. No Cervical HVLA manipulation was performed. After consent was obtained, treatment was  performed as below:      Regions treated: head, cervical, thoracic, lower extremity     Techniques used: MR, cranial - BMT, sub occ inhibition The patient tolerated the treatment well and reported Improved  symptoms following treatment today. Follow up treatment was advised in: 1-2 weeks  -Patient advised to return or notify a doctor immediately if symptoms worsen or persist or new concerns arise.  Patient Instructions  BEFORE YOU LEAVE: -lab appt -follow up: in 3 months or sooner if wishes to do more OMT  We have ordered labs or studies at this visit. It can take up to 1-2 weeks for results and processing. IF results require follow up or explanation, we will call you with instructions. Clinically stable results will be released to your [facility]. If you have not heard from [place] or cannot find your results in [facility] in 2 weeks please contact our office at [phone].  If you are not yet signed up for [facility], please consider signing up.           Colin Benton R., DO

## 2017-05-08 NOTE — Patient Instructions (Signed)
BEFORE YOU LEAVE: -lab appt -follow up: in 3 months or sooner if wishes to do more OMT  We have ordered labs or studies at this visit. It can take up to 1-2 weeks for results and processing. IF results require follow up or explanation, we will call you with instructions. Clinically stable results will be released to your [facility]. If you have not heard from [place] or cannot find your results in [facility] in 2 weeks please contact our office at [phone].  If you are not yet signed up for [facility], please consider signing up.

## 2017-05-14 ENCOUNTER — Encounter: Payer: Self-pay | Admitting: Family Medicine

## 2017-05-15 ENCOUNTER — Telehealth: Payer: Self-pay | Admitting: Family Medicine

## 2017-05-15 NOTE — Telephone Encounter (Signed)
Pt would like to see if she needs to get another pneumonia shot.

## 2017-05-19 ENCOUNTER — Ambulatory Visit (INDEPENDENT_AMBULATORY_CARE_PROVIDER_SITE_OTHER): Payer: Medicare Other

## 2017-05-19 ENCOUNTER — Other Ambulatory Visit (INDEPENDENT_AMBULATORY_CARE_PROVIDER_SITE_OTHER): Payer: Medicare Other

## 2017-05-19 ENCOUNTER — Ambulatory Visit: Payer: Medicare Other | Admitting: Physical Therapy

## 2017-05-19 DIAGNOSIS — E785 Hyperlipidemia, unspecified: Secondary | ICD-10-CM

## 2017-05-19 DIAGNOSIS — E049 Nontoxic goiter, unspecified: Secondary | ICD-10-CM | POA: Diagnosis not present

## 2017-05-19 DIAGNOSIS — R2681 Unsteadiness on feet: Secondary | ICD-10-CM

## 2017-05-19 DIAGNOSIS — R2689 Other abnormalities of gait and mobility: Secondary | ICD-10-CM | POA: Diagnosis not present

## 2017-05-19 DIAGNOSIS — E538 Deficiency of other specified B group vitamins: Secondary | ICD-10-CM

## 2017-05-19 DIAGNOSIS — Z23 Encounter for immunization: Secondary | ICD-10-CM | POA: Diagnosis not present

## 2017-05-19 DIAGNOSIS — R739 Hyperglycemia, unspecified: Secondary | ICD-10-CM | POA: Diagnosis not present

## 2017-05-19 DIAGNOSIS — E559 Vitamin D deficiency, unspecified: Secondary | ICD-10-CM | POA: Diagnosis not present

## 2017-05-19 DIAGNOSIS — G629 Polyneuropathy, unspecified: Secondary | ICD-10-CM

## 2017-05-19 DIAGNOSIS — R42 Dizziness and giddiness: Secondary | ICD-10-CM | POA: Diagnosis not present

## 2017-05-19 LAB — CBC WITH DIFFERENTIAL/PLATELET
Basophils Absolute: 0.1 10*3/uL (ref 0.0–0.1)
Basophils Relative: 0.9 % (ref 0.0–3.0)
EOS ABS: 0 10*3/uL (ref 0.0–0.7)
Eosinophils Relative: 0.8 % (ref 0.0–5.0)
HEMATOCRIT: 39.3 % (ref 36.0–46.0)
Hemoglobin: 12.9 g/dL (ref 12.0–15.0)
LYMPHS PCT: 23.6 % (ref 12.0–46.0)
Lymphs Abs: 1.4 10*3/uL (ref 0.7–4.0)
MCHC: 33 g/dL (ref 30.0–36.0)
MCV: 94.5 fl (ref 78.0–100.0)
Monocytes Absolute: 0.8 10*3/uL (ref 0.1–1.0)
Monocytes Relative: 12.5 % — ABNORMAL HIGH (ref 3.0–12.0)
NEUTROS ABS: 3.8 10*3/uL (ref 1.4–7.7)
Neutrophils Relative %: 62.2 % (ref 43.0–77.0)
PLATELETS: 239 10*3/uL (ref 150.0–400.0)
RBC: 4.15 Mil/uL (ref 3.87–5.11)
RDW: 13.8 % (ref 11.5–15.5)
WBC: 6.1 10*3/uL (ref 4.0–10.5)

## 2017-05-19 LAB — COMPREHENSIVE METABOLIC PANEL
ALT: 8 U/L (ref 0–35)
AST: 20 U/L (ref 0–37)
Albumin: 4.2 g/dL (ref 3.5–5.2)
Alkaline Phosphatase: 72 U/L (ref 39–117)
BILIRUBIN TOTAL: 0.6 mg/dL (ref 0.2–1.2)
BUN: 9 mg/dL (ref 6–23)
CALCIUM: 9.3 mg/dL (ref 8.4–10.5)
CO2: 33 meq/L — AB (ref 19–32)
CREATININE: 0.69 mg/dL (ref 0.40–1.20)
Chloride: 96 mEq/L (ref 96–112)
GFR: 89.09 mL/min (ref >60.00)
GLUCOSE: 86 mg/dL (ref 70–99)
Potassium: 4.5 mEq/L (ref 3.5–5.1)
SODIUM: 134 meq/L — AB (ref 135–145)
Total Protein: 6.9 g/dL (ref 6.0–8.3)

## 2017-05-19 LAB — VITAMIN B12: Vitamin B-12: 751 pg/mL (ref 211–911)

## 2017-05-19 LAB — VITAMIN D 25 HYDROXY (VIT D DEFICIENCY, FRACTURES): VITD: 37.37 ng/mL (ref 30.00–100.00)

## 2017-05-19 LAB — LIPID PANEL
CHOL/HDL RATIO: 5
Cholesterol: 245 mg/dL — ABNORMAL HIGH (ref 0–200)
HDL: 47.8 mg/dL (ref >39.00)
LDL CALC: 171 mg/dL — AB (ref 0–99)
NonHDL: 196.86
Triglycerides: 129 mg/dL (ref 0.0–149.0)
VLDL: 25.8 mg/dL (ref 0.0–40.0)

## 2017-05-19 LAB — TSH: TSH: 1.45 u[IU]/mL (ref 0.35–4.50)

## 2017-05-19 LAB — HEMOGLOBIN A1C: Hgb A1c MFr Bld: 5.7 % (ref 4.6–6.5)

## 2017-05-19 NOTE — Telephone Encounter (Signed)
I spoke with the pt today in the office and informed her of the message below.

## 2017-05-19 NOTE — Telephone Encounter (Signed)
It looks like she already had both pneumonia vaccines.

## 2017-05-20 NOTE — Patient Instructions (Signed)
Balance: Eyes Closed - Bilateral (Varied Surfaces)    Stand, feet shoulder width, close eyes. Maintain balance  45____ seconds. Repeat __1_ times per set. Do _1___ sets per session. Do _5___ sessions per week. Repeat on compliant surface: foam.  Have targets horizontally and vertically 10 reps each direction. Copyright  VHI. All rights reserved.  Feet Together (Compliant Surface) Varied Arm Positions - Eyes Open  _   Stand for 30 secs with eyes closed - slowly turn head side to side and up/down 10 times each direction

## 2017-05-20 NOTE — Therapy (Signed)
[facility] [address] [place], [place], [address] Phone: [phone]   Fax:  [phone]  Physical Therapy Treatment  Patient Details  Name: Brenda [name] MRN: [id number] Date of Birth: 1946/07/01 Referring Provider: Colin Benton, DO  Encounter Date: 05/19/2017      PT End of Session - 05/20/17 1505    Visit Number 2   Number of Visits 9   Date for PT Re-Evaluation 06/04/17   Authorization Type MCR   Authorization Time Period 05-05-17 - 07-04-17   PT Start Time 0803   PT Stop Time 6962   PT Time Calculation (min) 44 min      Past Medical History:  Diagnosis Date  . Arthritis   . Bronchitis    hx of when smoked   . Cancer ([place]) 04/16/09   kidney cancer - [facility] per her report released from f/u  . Cataracts, bilateral   . Diverticulitis 11/2004  . Endometriosis 1994  . GERD (gastroesophageal reflux disease)    hx hiatal hernia, hx esophageal stricture s/p dilation  . Headache   . Hepatitis   . History of chicken pox   . History of hiatal hernia   . History of measles   . History of mumps as a child   . Hx of hepatitis    with cirrhosis - per her report from [place] and eval in 2013 at [facility]  . Hyperlipidemia   . Left ear pain   . Migraines    pt states vesticular migraines has dizziness in relation   . Numbness and tingling    feet bilat has had for 40 years  . Right knee pain   . Scarlet fever   . Shingles   . Urinary tract bacterial infections    hx of  . Vertigo    associated with headache, intermittent, chronic    Past Surgical History:  Procedure Laterality Date  . ABDOMINAL HYSTERECTOMY  1994   TAH/BSO due to endometriosis  . APPENDECTOMY    . CATARACT EXTRACTION W/ INTRAOCULAR LENS IMPLANT Bilateral 05/2015, 04/2015  . DIAGNOSTIC LAPAROSCOPY    . LAPAROSCOPIC PARTIAL COLECTOMY N/A 04/10/2015   Procedure: LAPAROSCOPIC PARTIAL CECTOMY;  Surgeon: Ralene Ok, MD;   [facility]: [facility];  Service: General;  Laterality: N/A;  . LUNG BIOPSY  1996   Negative  . NEPHRECTOMY  04/16/09   Partial removal due to cancer  . OOPHORECTOMY    . TONSILLECTOMY    . TONSILLECTOMY      There were no vitals filed for this visit.      Subjective Assessment - 05/19/17 0802    Subjective Pt states fall is worst time of year - feels more dizzy than usual- head feels fuller; still unable to stand longer than a couple of minutes before she has to sit down; used cane some in house yesterday because she was tired   Pertinent History idiopathic peripheral neuropathy; pt was prescribed medication for migraines but states medication did not help; pt has sensorineural hearing loss (initally labyrinthitis? with sequelae?); h/o kidney cancer; chronic headaches   Patient Stated Goals "would like to be comfortable enough to not have to use cane and be self sufficient"   Currently in Pain? No/denies       NeuroRe-ed:    Marketing executive test - composite score 65/100 with N= 64/100 Condition 1 - all 3 trials WNL's Condition 2 - all 3 trials WNL's Condition 3 - 1st trial  WNL's:  Trials 2 and 3 below normal Condition 4 - trials 1 and 2 below normal (very slightly); trial 3 WNL's Condition 5 - trial 1 below normal:  Trials 2 and 3 WNL's Condition 6 - FALL on trial 1:  Trials 2 and 3 WNL's  Somatosensory, visual and vestibular inputs WNL's   Pt performed exercises standing on foam - in corner - with EO and with EC; targets side to side and up/down with EO and head Turns both directions with EC - these 4 positions given for HEP -- no head turns on position 4 (feet together with EC) due to difficulty  maintaining balance      Informed pt of exercise program on Channel 13 (local cable channel - at 8:00 and 1:00 Mon-Fri)                     PT Long Term Goals - 05/06/17 2055      PT LONG TERM GOAL #1   Title Improve TUG score to </= 13.0 secs with cane for  reduced fall risk.   Baseline 15.19 secs   Time 4   Period Weeks   Status New   Target Date 06/04/17     PT LONG TERM GOAL #2   Title Improve DVA to </= 2 line difference for improved gaze stabilization.   Baseline 3 line difference   Time 4   Period Weeks   Status New   Target Date 06/04/17     PT LONG TERM GOAL #3   Title Increase gait velocity from 2.52 to >/= 3.1 ft/sec with cane for incr. gait efficiency.   Baseline 13.00 secs   Time 4   Period Weeks   Status New   Target Date 06/04/17     PT LONG TERM GOAL #4   Title Pt will report at least 25% improvement in vertigo.   Time 4   Period Weeks   Status New   Target Date 06/04/17     PT LONG TERM GOAL #5   Title Independent in HEP for balance and vestibular exercises.   Time 4   Period Weeks   Status New   Target Date 06/04/17             Patient will benefit from skilled therapeutic intervention in order to improve the following deficits and impairments:     Visit Diagnosis: Other abnormalities of gait and mobility  Unsteadiness on feet     Problem List Patient Active Problem List   Diagnosis Date Noted  . S/P partial resection of colon 04/10/2015  . Cirrhosis ([place]) 12/08/2014  . Esophageal reflux 12/08/2014  . Esophageal stricture 12/08/2014  . Vertigo 12/08/2014    Alda Lea, PT 05/20/2017, 3:07 PM  [facility] [address] [place], [place], [address] Phone: [phone]   Fax:  [phone]  Name: Brenda [name] MRN: [id number] Date of Birth: 10-23-1945

## 2017-05-21 ENCOUNTER — Ambulatory Visit: Payer: Medicare Other | Admitting: Physical Therapy

## 2017-05-21 DIAGNOSIS — R2681 Unsteadiness on feet: Secondary | ICD-10-CM | POA: Diagnosis not present

## 2017-05-21 DIAGNOSIS — R2689 Other abnormalities of gait and mobility: Secondary | ICD-10-CM

## 2017-05-21 DIAGNOSIS — R42 Dizziness and giddiness: Secondary | ICD-10-CM | POA: Diagnosis not present

## 2017-05-21 MED ORDER — ROSUVASTATIN CALCIUM 10 MG PO TABS: 10.0000 mg | ORAL_TABLET | Freq: Every day | ORAL | 3 refills | Status: DC

## 2017-05-22 NOTE — Therapy (Signed)
[facility] [address] [place], [place], [address] Phone: [phone]   Fax:  [phone]  Physical Therapy Treatment  Patient Details  Name: Brenda [name] MRN: [id number] Date of Birth: [birth year] Referring Provider: Colin Benton, DO  Encounter Date: 05/21/2017      PT End of Session - 05/22/17 1017    Visit Number 3   Number of Visits 9   Date for PT Re-Evaluation 06/04/17   Authorization Type MCR   Authorization Time Period 05-05-17 - 07-04-17   PT Start Time 0850   PT Stop Time 0934   PT Time Calculation (min) 44 min   Equipment Utilized During Treatment Gait belt      Past Medical History:  Diagnosis Date  . Arthritis   . Bronchitis    hx of when smoked   . Cancer ([place]) 04/16/09   kidney cancer - tx by [facility] per her report released from f/u  . Cataracts, bilateral   . Diverticulitis 11/2004  . Endometriosis 1994  . GERD (gastroesophageal reflux disease)    hx hiatal hernia, hx esophageal stricture s/p dilation  . Headache   . Hepatitis   . History of chicken pox   . History of hiatal hernia   . History of measles   . History of mumps as a child   . Hx of hepatitis    with cirrhosis - per her report from [place] and eval in 2013 at [facility]  . Hyperlipidemia   . Left ear pain   . Migraines    pt states vesticular migraines has dizziness in relation   . Numbness and tingling    feet bilat has had for 40 years  . Right knee pain   . Scarlet fever   . Shingles   . Urinary tract bacterial infections    hx of  . Vertigo    associated with headache, intermittent, chronic    Past Surgical History:  Procedure Laterality Date  . ABDOMINAL HYSTERECTOMY  1994   TAH/BSO due to endometriosis  . APPENDECTOMY    . CATARACT EXTRACTION W/ INTRAOCULAR LENS IMPLANT Bilateral 05/2015, 04/2015  . DIAGNOSTIC LAPAROSCOPY    . LAPAROSCOPIC PARTIAL COLECTOMY N/A 04/10/2015   Procedure: LAPAROSCOPIC  PARTIAL CECTOMY;  Surgeon: Ralene Ok, MD;  Location: [facility];  Service: General;  Laterality: N/A;  . LUNG BIOPSY  1996   Negative  . NEPHRECTOMY  04/16/09   Partial removal due to cancer  . OOPHORECTOMY    . TONSILLECTOMY    . TONSILLECTOMY      There were no vitals filed for this visit.      Subjective Assessment - 05/22/17 1003    Subjective Pt states she continues to feel a fullness and pressure inside her head - no changes since last session   Pertinent History idiopathic peripheral neuropathy; pt was prescribed medication for migraines but states medication did not help; pt has sensorineural hearing loss (initally labyrinthitis? with sequelae?); h/o kidney cancer; chronic headaches   Patient Stated Goals "would like to be comfortable enough to not have to use cane and be self sufficient"   Currently in Pain? No/denies                         [facility] Adult PT Treatment/Exercise - 05/22/17 0001      Ambulation/Gait   Ambulation/Gait Yes   Ambulation/Gait Assistance 6: Modified independent (Device/Increase time)   Ambulation Distance (Feet)  100 Feet   Assistive device None  in clinic    Gait Pattern Within Functional Limits   Ambulation Surface Level;Indoor     High Level Balance   High Level Balance Activities Marching forwards;Turns;Backward walking     Exercises   Exercises Knee/Hip     Knee/Hip Exercises: Aerobic   Nustep Level 4 x 5" with UE's and LE's             Balance Exercises - 05/22/17 1012      Balance Exercises: Standing   Standing Eyes Opened Narrow base of support (BOS);Wide (BOA);Head turns;Foam/compliant surface;5 reps   Standing Eyes Closed Wide (BOA);Head turns;Foam/compliant surface;5 reps   SLS with Vectors Foam/compliant surface;5 reps;Upper extremity assist 2  on inverted Bosu inside // bars   Rockerboard Anterior/posterior;10 reps;EO;Intermittent UE support   Other Standing Exercises sit to stand on floor 5 reps  without UE support:  sit to stand on blue balance beam 5 reps with UE support prn with CGA to min assist:pt stood on balance beam iwth horizontal and vertical hea dtunrs 5 reps each iwth min assist                 PT Long Term Goals - 05/06/17 2055      PT LONG TERM GOAL #1   Title Improve TUG score to </= 13.0 secs with cane for reduced fall risk.   Baseline 15.19 secs   Time 4   Period Weeks   Status New   Target Date 06/04/17     PT LONG TERM GOAL #2   Title Improve DVA to </= 2 line difference for improved gaze stabilization.   Baseline 3 line difference   Time 4   Period Weeks   Status New   Target Date 06/04/17     PT LONG TERM GOAL #3   Title Increase gait velocity from 2.52 to >/= 3.1 ft/sec with cane for incr. gait efficiency.   Baseline 13.00 secs   Time 4   Period Weeks   Status New   Target Date 06/04/17     PT LONG TERM GOAL #4   Title Pt will report at least 25% improvement in vertigo.   Time 4   Period Weeks   Status New   Target Date 06/04/17     PT LONG TERM GOAL #5   Title Independent in HEP for balance and vestibular exercises.   Time 4   Period Weeks   Status New   Target Date 06/04/17               Plan - 05/22/17 1018    Clinical Impression Statement Pt has more difficulty maintaining balance on compliant surfaces with EC than she does with EO, indicative of decr. vestibular input.  Pt tolerated exercises well with min to mod c/o increased dizziness.   PT Frequency 2x / week   PT Duration 4 weeks   PT Treatment/Interventions ADLs/Self Care Home Management;Gait training;Stair training;Therapeutic activities;Therapeutic exercise;Balance training;Neuromuscular re-education;Patient/family education;Vestibular   PT Next Visit Plan cont balance exercises on compliant surfaces   PT Home Exercise Plan x1 viewing; balance on foam added 05-19-17   Consulted and Agree with Plan of Care Patient      Patient will benefit from skilled  therapeutic intervention in order to improve the following deficits and impairments:  Abnormal gait, Decreased balance, Difficulty walking, Dizziness  Visit Diagnosis: Other abnormalities of gait and mobility  Unsteadiness on feet     Problem  List Patient Active Problem List   Diagnosis Date Noted  . S/P partial resection of colon 04/10/2015  . Cirrhosis ([place]) 12/08/2014  . Esophageal reflux 12/08/2014  . Esophageal stricture 12/08/2014  . Vertigo 12/08/2014    Alda Lea, PT 05/22/2017, 10:23 AM  [facility] [address] [place], [place], [address] Phone: [phone]   Fax:  [phone]  Name: Brenda [name] MRN: [id number] Date of Birth: Feb 20, 1946

## 2017-05-26 ENCOUNTER — Ambulatory Visit: Payer: Medicare Other | Attending: Family Medicine | Admitting: Physical Therapy

## 2017-05-26 DIAGNOSIS — R42 Dizziness and giddiness: Secondary | ICD-10-CM | POA: Diagnosis not present

## 2017-05-26 DIAGNOSIS — R2681 Unsteadiness on feet: Secondary | ICD-10-CM | POA: Diagnosis not present

## 2017-05-26 DIAGNOSIS — R2689 Other abnormalities of gait and mobility: Secondary | ICD-10-CM | POA: Diagnosis not present

## 2017-05-27 NOTE — Therapy (Signed)
[facility] [address] [place], [place], [address] Phone: [phone]   Fax:  [phone]  Physical Therapy Treatment  Patient Details  Name: Brenda [name] MRN: [id number] Date of Birth: 12/12/1945 Referring Provider: Colin Benton, DO  Encounter Date: 05/26/2017      PT End of Session - 05/27/17 2100    Visit Number 4   Number of Visits 9   Date for PT Re-Evaluation 06/04/17   Authorization Type MCR   Authorization Time Period 05-05-17 - 07-04-17   PT Start Time 1536   PT Stop Time 1622   PT Time Calculation (min) 46 min   Equipment Utilized During Treatment Gait belt      Past Medical History:  Diagnosis Date  . Arthritis   . Bronchitis    hx of when smoked   . Cancer ([place]) 04/16/09   kidney cancer - tx [facility] per her report released from f/u  . Cataracts, bilateral   . Diverticulitis 11/2004  . Endometriosis 1994  . GERD (gastroesophageal reflux disease)    hx hiatal hernia, hx esophageal stricture s/p dilation  . Headache   . Hepatitis   . History of chicken pox   . History of hiatal hernia   . History of measles   . History of mumps as a child   . Hx of hepatitis    with cirrhosis - per her report from [place] and eval in 2013 at [place]  . Hyperlipidemia   . Left ear pain   . Migraines    pt states vesticular migraines has dizziness in relation   . Numbness and tingling    feet bilat has had for 40 years  . Right knee pain   . Scarlet fever   . Shingles   . Urinary tract bacterial infections    hx of  . Vertigo    associated with headache, intermittent, chronic    Past Surgical History:  Procedure Laterality Date  . ABDOMINAL HYSTERECTOMY  1994   TAH/BSO due to endometriosis  . APPENDECTOMY    . CATARACT EXTRACTION W/ INTRAOCULAR LENS IMPLANT Bilateral 05/2015, 04/2015  . DIAGNOSTIC LAPAROSCOPY    . LAPAROSCOPIC PARTIAL COLECTOMY N/A 04/10/2015   Procedure: LAPAROSCOPIC  PARTIAL CECTOMY;  Surgeon: Ralene Ok, MD;  Location: [facility];  Service: General;  Laterality: N/A;  . LUNG BIOPSY  1996   Negative  . NEPHRECTOMY  04/16/09   Partial removal due to cancer  . OOPHORECTOMY    . TONSILLECTOMY    . TONSILLECTOMY      There were no vitals filed for this visit.      Subjective Assessment - 05/27/17 2053    Subjective Pt was carrying basket up steps on Sunday - got to top of stairs and got very dizzy - laid down for about 2 hours until she felt better - woke up Monday and felt fine   Pertinent History idiopathic peripheral neuropathy; pt was prescribed medication for migraines but states medication did not help; pt has sensorineural hearing loss (initally labyrinthitis? with sequelae?); h/o kidney cancer; chronic headaches   Patient Stated Goals "would like to be comfortable enough to not have to use cane and be self sufficient"   Currently in Pain? No/denies                         [facility] Adult PT Treatment/Exercise - 05/27/17 0001      High Level  Balance   High Level Balance Activities Side stepping;Braiding;Backward walking;Marching forwards;Marching backwards;Negotiating over obstacles     Knee/Hip Exercises: Aerobic   Recumbent Bike SciFit level 2.0 x 6" with UE's and LE's     Knee/Hip Exercises: Standing   Heel Raises Both;1 set;10 reps             Balance Exercises - 05/27/17 2058      Balance Exercises: Standing   Standing Eyes Opened Narrow base of support (BOS);Wide (BOA);Head turns;Foam/compliant surface;5 reps  on incline and on decline   Standing Eyes Closed Wide (BOA);Head turns;Foam/compliant surface;5 reps   SLS with Vectors Foam/compliant surface;5 reps;Upper extremity assist 2  on inverted Bosu inside // bars   Rockerboard Anterior/posterior;10 reps;EO;Intermittent UE support   Other Standing Exercises Pt stood on blue balance beam for incr. hip strategy - with head turns side to side and up/down                 PT Long Term Goals - 05/06/17 2055      PT LONG TERM GOAL #1   Title Improve TUG score to </= 13.0 secs with cane for reduced fall risk.   Baseline 15.19 secs   Time 4   Period Weeks   Status New   Target Date 06/04/17     PT LONG TERM GOAL #2   Title Improve DVA to </= 2 line difference for improved gaze stabilization.   Baseline 3 line difference   Time 4   Period Weeks   Status New   Target Date 06/04/17     PT LONG TERM GOAL #3   Title Increase gait velocity from 2.52 to >/= 3.1 ft/sec with cane for incr. gait efficiency.   Baseline 13.00 secs   Time 4   Period Weeks   Status New   Target Date 06/04/17     PT LONG TERM GOAL #4   Title Pt will report at least 25% improvement in vertigo.   Time 4   Period Weeks   Status New   Target Date 06/04/17     PT LONG TERM GOAL #5   Title Independent in HEP for balance and vestibular exercises.   Time 4   Period Weeks   Status New   Target Date 06/04/17               Plan - 05/27/17 2100    Clinical Impression Statement Pt cont to have difficulty maintaining balance on compliant surfaces with EC but is improving with intermittent assist needed for recovery of LOB   Rehab Potential Good   PT Frequency 2x / week   PT Duration 4 weeks   PT Treatment/Interventions ADLs/Self Care Home Management;Gait training;Stair training;Therapeutic activities;Therapeutic exercise;Balance training;Neuromuscular re-education;Patient/family education;Vestibular   PT Next Visit Plan cont balance exercises on compliant surfaces   PT Home Exercise Plan x1 viewing; balance on foam added 05-19-17   Consulted and Agree with Plan of Care Patient      Patient will benefit from skilled therapeutic intervention in order to improve the following deficits and impairments:  Abnormal gait, Decreased balance, Difficulty walking, Dizziness  Visit Diagnosis: Unsteadiness on feet  Other abnormalities of gait and  mobility     Problem List Patient Active Problem List   Diagnosis Date Noted  . S/P partial resection of colon 04/10/2015  . Cirrhosis ([place]) 12/08/2014  . Esophageal reflux 12/08/2014  . Esophageal stricture 12/08/2014  . Vertigo 12/08/2014    T[place]on Roker, [place], PT  05/27/2017, 9:03 PM  [facility] [address] [place], [place], [address] Phone: [phone]   Fax:  [phone]  Name: JOZETTE CASTRELLON MRN: [id number] Date of Birth: [birth year]

## 2017-05-28 ENCOUNTER — Ambulatory Visit: Payer: Medicare Other | Admitting: Physical Therapy

## 2017-05-28 DIAGNOSIS — R2689 Other abnormalities of gait and mobility: Secondary | ICD-10-CM

## 2017-05-28 DIAGNOSIS — R2681 Unsteadiness on feet: Secondary | ICD-10-CM

## 2017-05-28 DIAGNOSIS — R42 Dizziness and giddiness: Secondary | ICD-10-CM | POA: Diagnosis not present

## 2017-05-29 NOTE — Therapy (Signed)
[facility] [address] [place], [place], [address] Phone: [phone]   Fax:  [phone]  Physical Therapy Treatment  Patient Details  Name: Brenda [name] MRN: [id number] Date of Birth: [birth year] Referring Provider: Colin Benton, DO  Encounter Date: 05/28/2017      PT End of Session - 05/29/17 1002    Visit Number 5   Number of Visits 9   Date for PT Re-Evaluation 06/04/17   Authorization Type MCR   Authorization Time Period 05-05-17 - 07-04-17   PT Start Time 1408   PT Stop Time 1451   PT Time Calculation (min) 43 min   Equipment Utilized During Treatment Gait belt      Past Medical History:  Diagnosis Date  . Arthritis   . Bronchitis    hx of when smoked   . Cancer ([place]) 04/16/09   kidney cancer - tx by [facility] per her report released from f/u  . Cataracts, bilateral   . Diverticulitis 11/2004  . Endometriosis 1994  . GERD (gastroesophageal reflux disease)    hx hiatal hernia, hx esophageal stricture s/p dilation  . Headache   . Hepatitis   . History of chicken pox   . History of hiatal hernia   . History of measles   . History of mumps as a child   . Hx of hepatitis    with cirrhosis - per her report from [place] and eval in 2013 at [place]  . Hyperlipidemia   . Left ear pain   . Migraines    pt states vesticular migraines has dizziness in relation   . Numbness and tingling    feet bilat has had for 40 years  . Right knee pain   . Scarlet fever   . Shingles   . Urinary tract bacterial infections    hx of  . Vertigo    associated with headache, intermittent, chronic    Past Surgical History:  Procedure Laterality Date  . ABDOMINAL HYSTERECTOMY  1994   TAH/BSO due to endometriosis  . APPENDECTOMY    . CATARACT EXTRACTION W/ INTRAOCULAR LENS IMPLANT Bilateral 05/2015, 04/2015  . DIAGNOSTIC LAPAROSCOPY    . LAPAROSCOPIC PARTIAL COLECTOMY N/A 04/10/2015   Procedure: LAPAROSCOPIC  PARTIAL CECTOMY;  Surgeon: Ralene Ok, MD;  Location: [facility];  Service: General;  Laterality: N/A;  . LUNG BIOPSY  1996   Negative  . NEPHRECTOMY  04/16/09   Partial removal due to cancer  . OOPHORECTOMY    . TONSILLECTOMY    . TONSILLECTOMY      There were no vitals filed for this visit.      Subjective Assessment - 05/29/17 0955    Subjective Pt reports she feels balance is improving - went to lunch with some friends who commented that she looked more steady with walking   Pertinent History idiopathic peripheral neuropathy; pt was prescribed medication for migraines but states medication did not help; pt has sensorineural hearing loss (initally labyrinthitis? with sequelae?); h/o kidney cancer; chronic headaches   Patient Stated Goals "would like to be comfortable enough to not have to use cane and be self sufficient"   Currently in Pain? No/denies                         [facility] Adult PT Treatment/Exercise - 05/29/17 0001      Transfers   Transfers Sit to Stand   Number of Reps 10 reps  on blue Airex     Ambulation/Gait   Ambulation/Gait Yes   Ambulation/Gait Assistance 4: Min guard   Ambulation Distance (Feet) 230 Feet   Assistive device None  in clinic    Gait Pattern Within Functional Limits   Ambulation Surface Level;Indoor   Gait Comments pt performed horizontal and vertical head turns for incr. vestiublar input in maintaining balance      High Level Balance   High Level Balance Activities Backward walking;Side stepping;Head turns;Marching forwards;Marching backwards  on blue and red mats for compliant surface training     Knee/Hip Exercises: Aerobic   Nustep Level 4 x 5" with UE's and LE's             Balance Exercises - 05/29/17 1000      Balance Exercises: Standing   Standing Eyes Opened Narrow base of support (BOS);Wide (BOA);Head turns;Foam/compliant surface;5 reps  on incline and on decline   Standing Eyes Closed Wide  (BOA);Head turns;Foam/compliant surface;5 reps   SLS with Vectors Foam/compliant surface;5 reps;Upper extremity assist 2  on inverted Bosu inside // bars   Other Standing Exercises Pt performed marching on blue AIrex - added head turns horizontal and vertical: alternate tap downs to floor with CGA                PT Long Term Goals - 05/06/17 2055      PT LONG TERM GOAL #1   Title Improve TUG score to </= 13.0 secs with cane for reduced fall risk.   Baseline 15.19 secs   Time 4   Period Weeks   Status New   Target Date 06/04/17     PT LONG TERM GOAL #2   Title Improve DVA to </= 2 line difference for improved gaze stabilization.   Baseline 3 line difference   Time 4   Period Weeks   Status New   Target Date 06/04/17     PT LONG TERM GOAL #3   Title Increase gait velocity from 2.52 to >/= 3.1 ft/sec with cane for incr. gait efficiency.   Baseline 13.00 secs   Time 4   Period Weeks   Status New   Target Date 06/04/17     PT LONG TERM GOAL #4   Title Pt will report at least 25% improvement in vertigo.   Time 4   Period Weeks   Status New   Target Date 06/04/17     PT LONG TERM GOAL #5   Title Independent in HEP for balance and vestibular exercises.   Time 4   Period Weeks   Status New   Target Date 06/04/17               Plan - 05/29/17 1003    Clinical Impression Statement Pt's balance is improving with standing on compliant surfaces; pt continues to have more unsteadiness with maintaining balance with vertical head turns than with horizontal head turns and with EC than with EO.  Pt progressing well towards goals.     Rehab Potential Good   PT Frequency 2x / week   PT Duration 4 weeks   PT Treatment/Interventions ADLs/Self Care Home Management;Gait training;Stair training;Therapeutic activities;Therapeutic exercise;Balance training;Neuromuscular re-education;Patient/family education;Vestibular   PT Next Visit Plan cont balance exercises on compliant  surfaces   PT Home Exercise Plan x1 viewing; balance on foam added 05-19-17   Consulted and Agree with Plan of Care Patient      Patient will benefit from skilled therapeutic intervention in order  to improve the following deficits and impairments:  Abnormal gait, Decreased balance, Difficulty walking, Dizziness  Visit Diagnosis: Unsteadiness on feet  Other abnormalities of gait and mobility     Problem List Patient Active Problem List   Diagnosis Date Noted  . S/P partial resection of colon 04/10/2015  . Cirrhosis ([place]) 12/08/2014  . Esophageal reflux 12/08/2014  . Esophageal stricture 12/08/2014  . Vertigo 12/08/2014    Alda Lea, PT 05/29/2017, 10:06 AM  [facility] [address] [place], [place], [address] Phone: [phone]   Fax:  [phone]  Name: Brenda [name] MRN: [id number] Date of Birth: [birth year]

## 2017-06-02 ENCOUNTER — Other Ambulatory Visit: Payer: Self-pay | Admitting: Obstetrics and Gynecology

## 2017-06-02 ENCOUNTER — Ambulatory Visit: Payer: Medicare Other | Admitting: Physical Therapy

## 2017-06-02 DIAGNOSIS — R2689 Other abnormalities of gait and mobility: Secondary | ICD-10-CM | POA: Diagnosis not present

## 2017-06-02 DIAGNOSIS — R2681 Unsteadiness on feet: Secondary | ICD-10-CM | POA: Diagnosis not present

## 2017-06-02 DIAGNOSIS — R42 Dizziness and giddiness: Secondary | ICD-10-CM | POA: Diagnosis not present

## 2017-06-02 DIAGNOSIS — Z1231 Encounter for screening mammogram for malignant neoplasm of breast: Secondary | ICD-10-CM

## 2017-06-03 NOTE — Therapy (Signed)
[facility] [address] [place], [place], [address] Phone: [phone]   Fax:  [phone]  Physical Therapy Treatment  Patient Details  Name: Brenda [name] MRN: [id number] Date of Birth: 1945-09-18 Referring Provider: Colin Benton, DO  Encounter Date: 06/02/2017      PT End of Session - 06/03/17 2001    Visit Number 6   Number of Visits 9   Date for PT Re-Evaluation 06/04/17   Authorization Type MCR   Authorization Time Period 05-05-17 - 07-04-17   PT Start Time 1404   PT Stop Time 1450   PT Time Calculation (min) 46 min      Past Medical History:  Diagnosis Date  . Arthritis   . Bronchitis    hx of when smoked   . Cancer ([place]) 04/16/09   kidney cancer - tx by [facility] per her report released from f/u  . Cataracts, bilateral   . Diverticulitis 11/2004  . Endometriosis 1994  . GERD (gastroesophageal reflux disease)    hx hiatal hernia, hx esophageal stricture s/p dilation  . Headache   . Hepatitis   . History of chicken pox   . History of hiatal hernia   . History of measles   . History of mumps as a child   . Hx of hepatitis    with cirrhosis - per her report from [place] and eval in 2013 at [place]  . Hyperlipidemia   . Left ear pain   . Migraines    pt states vesticular migraines has dizziness in relation   . Numbness and tingling    feet bilat has had for 40 years  . Right knee pain   . Scarlet fever   . Shingles   . Urinary tract bacterial infections    hx of  . Vertigo    associated with headache, intermittent, chronic    Past Surgical History:  Procedure Laterality Date  . ABDOMINAL HYSTERECTOMY  1994   TAH/BSO due to endometriosis  . APPENDECTOMY    . CATARACT EXTRACTION W/ INTRAOCULAR LENS IMPLANT Bilateral 05/2015, 04/2015  . DIAGNOSTIC LAPAROSCOPY    . LAPAROSCOPIC PARTIAL COLECTOMY N/A 04/10/2015   Procedure: LAPAROSCOPIC PARTIAL CECTOMY;  Surgeon: Ralene Ok, MD;   Location: [facility];  Service: General;  Laterality: N/A;  . LUNG BIOPSY  1996   Negative  . NEPHRECTOMY  04/16/09   Partial removal due to cancer  . OOPHORECTOMY    . TONSILLECTOMY    . TONSILLECTOMY      There were no vitals filed for this visit.      Subjective Assessment - 06/03/17 1956    Subjective Pt reports no changes - continues to feel that her balance is improving slowly   Pertinent History idiopathic peripheral neuropathy; pt was prescribed medication for migraines but states medication did not help; pt has sensorineural hearing loss (initally labyrinthitis? with sequelae?); h/o kidney cancer; chronic headaches   Patient Stated Goals "would like to be comfortable enough to not have to use cane and be self sufficient"   Currently in Pain? No/denies                         [facility] Adult PT Treatment/Exercise - 06/03/17 0001      Transfers   Transfers Sit to Stand   Number of Reps Other reps (comment)  5   Comments performed on blue Airex     High Level Balance  High Level Balance Activities Side stepping;Marching forwards;Marching backwards;Negotiating over obstacles;Backward walking     Knee/Hip Exercises: Aerobic   Nustep Level 4 x 5" with UE's and LE's             Balance Exercises - 06/03/17 1959      Balance Exercises: Standing   Standing Eyes Opened Narrow base of support (BOS);Wide (BOA);Head turns;Foam/compliant surface;5 reps  on incline and on decline   Standing Eyes Closed Wide (BOA);Head turns;Foam/compliant surface;5 reps   Tandem Stance Eyes open;3 reps;Foam/compliant surface   SLS with Vectors Foam/compliant surface;5 reps;Upper extremity assist 2  on inverted Bosu inside // bars   Other Standing Exercises balance exercises performed in corner with EO and eC - touching each wall and diagonals 5 reps each      amb. Making circles with ball clockwise and counterclockwise to improve gaze stabilization - CGA for  safety           PT Long Term Goals - 05/06/17 2055      PT LONG TERM GOAL #1   Title Improve TUG score to </= 13.0 secs with cane for reduced fall risk.   Baseline 15.19 secs   Time 4   Period Weeks   Status New   Target Date 06/04/17     PT LONG TERM GOAL #2   Title Improve DVA to </= 2 line difference for improved gaze stabilization.   Baseline 3 line difference   Time 4   Period Weeks   Status New   Target Date 06/04/17     PT LONG TERM GOAL #3   Title Increase gait velocity from 2.52 to >/= 3.1 ft/sec with cane for incr. gait efficiency.   Baseline 13.00 secs   Time 4   Period Weeks   Status New   Target Date 06/04/17     PT LONG TERM GOAL #4   Title Pt will report at least 25% improvement in vertigo.   Time 4   Period Weeks   Status New   Target Date 06/04/17     PT LONG TERM GOAL #5   Title Independent in HEP for balance and vestibular exercises.   Time 4   Period Weeks   Status New   Target Date 06/04/17               Plan - 06/03/17 2002    Clinical Impression Statement Pt progressing well towards goals with increased steadiness noted with standing activities on compliant surfaces; continues to have more unsteadiness with activities with vertical head turns on compliant surfaces   PT Frequency 2x / week   PT Duration 4 weeks   PT Treatment/Interventions ADLs/Self Care Home Management;Gait training;Stair training;Therapeutic activities;Therapeutic exercise;Balance training;Neuromuscular re-education;Patient/family education;Vestibular   PT Next Visit Plan check goals - renew   PT Home Exercise Plan x1 viewing; balance on foam added 05-19-17   Consulted and Agree with Plan of Care Patient      Patient will benefit from skilled therapeutic intervention in order to improve the following deficits and impairments:  Abnormal gait, Decreased balance, Difficulty walking, Dizziness  Visit Diagnosis: Unsteadiness on feet  Other abnormalities of  gait and mobility     Problem List Patient Active Problem List   Diagnosis Date Noted  . S/P partial resection of colon 04/10/2015  . Cirrhosis ([place]) 12/08/2014  . Esophageal reflux 12/08/2014  . Esophageal stricture 12/08/2014  . Vertigo 12/08/2014    Alda Lea, PT 06/03/2017, 8:05 PM  [facility] [address] [place], [place], [address] Phone: [phone]   Fax:  [phone]  Name: Brenda [name] MRN: [id number] Date of Birth: August 02, 1946

## 2017-06-04 ENCOUNTER — Ambulatory Visit: Payer: Medicare Other | Admitting: Physical Therapy

## 2017-06-04 DIAGNOSIS — R2681 Unsteadiness on feet: Secondary | ICD-10-CM | POA: Diagnosis not present

## 2017-06-04 DIAGNOSIS — R2689 Other abnormalities of gait and mobility: Secondary | ICD-10-CM

## 2017-06-04 DIAGNOSIS — R42 Dizziness and giddiness: Secondary | ICD-10-CM

## 2017-06-04 NOTE — Therapy (Signed)
[facility] [address] [place], [place], [address] Phone: [phone]   Fax:  [phone]  Physical Therapy Treatment  Patient Details  Name: Brenda [name] MRN: [id number] Date of Birth: November 02, 1945 Referring Provider: Colin Benton, DO  Encounter Date: 06/04/2017      PT End of Session - 06/04/17 2119    Visit Number 7  G code completed today   Number of Visits 9   Date for PT Re-Evaluation 07/05/17   Authorization Type MCR   Authorization Time Period 06-04-17 - 08-03-17   PT Start Time 1405   PT Stop Time 1450   PT Time Calculation (min) 45 min      Past Medical History:  Diagnosis Date  . Arthritis   . Bronchitis    hx of when smoked   . Cancer ([place]) 04/16/09   kidney cancer - tx [facility] per her report released from f/u  . Cataracts, bilateral   . Diverticulitis 11/2004  . Endometriosis 1994  . GERD (gastroesophageal reflux disease)    hx hiatal hernia, hx esophageal stricture s/p dilation  . Headache   . Hepatitis   . History of chicken pox   . History of hiatal hernia   . History of measles   . History of mumps as a child   . Hx of hepatitis    with cirrhosis - per her report from [place] and eval in 2013 at [facility]  . Hyperlipidemia   . Left ear pain   . Migraines    pt states vesticular migraines has dizziness in relation   . Numbness and tingling    feet bilat has had for 40 years  . Right knee pain   . Scarlet fever   . Shingles   . Urinary tract bacterial infections    hx of  . Vertigo    associated with headache, intermittent, chronic    Past Surgical History:  Procedure Laterality Date  . ABDOMINAL HYSTERECTOMY  1994   TAH/BSO due to endometriosis  . APPENDECTOMY    . CATARACT EXTRACTION W/ INTRAOCULAR LENS IMPLANT Bilateral 05/2015, 04/2015  . DIAGNOSTIC LAPAROSCOPY    . LAPAROSCOPIC PARTIAL COLECTOMY N/A 04/10/2015   Procedure: LAPAROSCOPIC PARTIAL CECTOMY;   Surgeon: Ralene Ok, MD;  Location: [facility];  Service: General;  Laterality: N/A;  . LUNG BIOPSY  1996   Negative  . NEPHRECTOMY  04/16/09   Partial removal due to cancer  . OOPHORECTOMY    . TONSILLECTOMY    . TONSILLECTOMY      There were no vitals filed for this visit.      Subjective Assessment - 06/04/17 2109    Subjective Pt states she feels she is much improved with balance and with walking   Pertinent History idiopathic peripheral neuropathy; pt was prescribed medication for migraines but states medication did not help; pt has sensorineural hearing loss (initally labyrinthitis? with sequelae?); h/o kidney cancer; chronic headaches   Patient Stated Goals "would like to be comfortable enough to not have to use cane and be self sufficient"   Currently in Pain? No/denies                         [facility] Adult PT Treatment/Exercise - 06/04/17 1418      Ambulation/Gait   Ambulation/Gait Yes   Ambulation/Gait Assistance 5: Supervision   Ambulation/Gait Assistance Details 100   Assistive device None   Gait Pattern Within Functional  Limits   Ambulation Surface Level;Indoor     Knee/Hip Exercises: Stretches   Press photographer Right;Left;1 rep;30 seconds  using bottom shelf of cabinet     Knee/Hip Exercises: Aerobic   Nustep Level 4 x 12" with UE's and LE's  no charge as unsupervised     Knee/Hip Exercises: Standing   Heel Raises Both;1 set;10 reps   Other Standing Knee Exercises Unilateral heel raises - each leg 10 reps each   Other Standing Knee Exercises Ankle sways x 10 reps each             Balance Exercises - 06/04/17 2114      Balance Exercises: Standing   Standing Eyes Opened Wide (BOA);Head turns;Foam/compliant surface;5 reps   Standing Eyes Closed Wide (BOA);Head turns;Foam/compliant surface;5 reps   Other Standing Exercises Marching along counter with head turns horizontal and vertical            PT Education - 06/04/17 2118     Education provided Yes   Education Details added heel cord stretch and heel lifts to HEP - bilateral and unilateral   Person(s) Educated Patient   Methods Explanation;Demonstration;Handout   Comprehension Verbalized understanding;Returned demonstration             PT Long Term Goals - 06/04/17 1406      PT LONG TERM GOAL #1   Title Improve TUG score to </= 13.0 secs with cane for reduced fall risk.   Baseline 12.44 secs   Time 4   Period Weeks   Status Achieved     PT LONG TERM GOAL #2   Title Improve DVA to </= 2 line difference for improved gaze stabilization.   Baseline 2 line difference with c/o dizziness upon completion of testing   Status On-going   Target Date 07/05/17     PT LONG TERM GOAL #3   Title Increase gait velocity from 2.52 to >/= 3.1 ft/sec with cane for incr. gait efficiency.   Baseline 10.19 secs = 3.22 ft/sec   Time 4   Period Weeks   Status Achieved   Target Date 07/05/17     PT LONG TERM GOAL #4   Title Pt will report at least 25% improvement in vertigo.   Baseline 45% improvement reported on 06-04-17   Status Achieved     PT LONG TERM GOAL #5   Title Independent in HEP for balance and vestibular exercises.   Status Achieved     PT LONG TERM GOAL #6   Title Pt will report at least 50% improvement in vertigo.   Time 4   Period Weeks   Status New   Target Date 07/05/17     PT LONG TERM GOAL #7   Title Pt will amb. 65' with horizontal head turns without moderate LOB with dizziness rating </= 3/10 intensity.   Time 4   Period Weeks   Status New   Target Date 07/05/17     PT LONG TERM GOAL #8   Title Amb. 300' modified independently without cane on flat,even surface without LOB.   Time 4   Period Weeks   Status New   Target Date 07/05/17               Plan - 06/04/17 2129    Clinical Impression Statement Pt has met LTG's #1, 3-5:  pt is progressing well and reports much improvement with gait and reduction in vertigo    Rehab Potential Good   PT Frequency 2x /  week   PT Duration 4 weeks   PT Treatment/Interventions ADLs/Self Care Home Management;Gait training;Stair training;Therapeutic activities;Therapeutic exercise;Balance training;Neuromuscular re-education;Patient/family education;Vestibular   PT Next Visit Plan renewal completed 06/23/2017:  cont balance and gait   PT Home Exercise Plan x1 viewing; balance on foam added 05-19-17; added heel lifts and heel cord stretch on 2017/06/23   Consulted and Agree with Plan of Care Patient      Patient will benefit from skilled therapeutic intervention in order to improve the following deficits and impairments:  Abnormal gait, Decreased balance, Difficulty walking, Dizziness  Visit Diagnosis: Unsteadiness on feet - Plan: PT plan of care cert/re-cert  Dizziness and giddiness - Plan: PT plan of care cert/re-cert  Other abnormalities of gait and mobility - Plan: PT plan of care cert/re-cert       G-Codes - 06/23/17 2131    Functional Assessment Tool Used (Outpatient Only) TUG score 12.44 secs without cane:  gait velocity 3.22 ft/sec; DVA 2 line difference   Functional Limitation Mobility: Walking and moving around   Mobility: Walking and Moving Around Current Status [phone]) At least 20 percent but less than 40 percent impaired, limited or restricted   Mobility: Walking and Moving Around Goal Status [phone]) At least 1 percent but less than 20 percent impaired, limited or restricted      Problem List Patient Active Problem List   Diagnosis Date Noted  . S/P partial resection of colon 04/10/2015  . Cirrhosis ([place]) 12/08/2014  . Esophageal reflux 12/08/2014  . Esophageal stricture 12/08/2014  . Vertigo 12/08/2014    Alda Lea, PT 06-23-17, 9:39 PM  [facility] [address] [place], [place], [address] Phone: [phone]   Fax:  [phone]  Name: Brenda [name] MRN:  [id number] Date of Birth: 1946-08-05

## 2017-06-04 NOTE — Patient Instructions (Signed)
Gastroc / Heel Cord Stretch - On Step    Stand with heels over edge of stair. Holding rail, lower heels until stretch is felt in calf of legs. Repeat _1-2_ times. Do _3__ times per day.  HOLD 30 secs - can use 3" block or book  Copyright  VHI. All rights reserved.  Heel Raise: Unilateral (Standing)    Balance on left foot, then rise on ball of foot. Repeat _10___ times per set. Do __1__ sets per session. Do _3___ sessions per day.  http://orth.exer.us/41   Copyright  VHI. All rights reserved.  Heel Raises    Stand with support. Tighten pelvic floor and hold. With knees straight, raise heels off ground. Hold _3_ seconds. Relax for _2__ seconds. Repeat _10-15__ times. Do __2_ times a day.  Copyright  VHI. All rights reserved.

## 2017-06-09 ENCOUNTER — Ambulatory Visit: Payer: Medicare Other | Admitting: Physical Therapy

## 2017-06-09 DIAGNOSIS — R42 Dizziness and giddiness: Secondary | ICD-10-CM | POA: Diagnosis not present

## 2017-06-09 DIAGNOSIS — R2681 Unsteadiness on feet: Secondary | ICD-10-CM

## 2017-06-09 DIAGNOSIS — R2689 Other abnormalities of gait and mobility: Secondary | ICD-10-CM | POA: Diagnosis not present

## 2017-06-10 NOTE — Therapy (Signed)
[facility] [address] [place], [place], [address] Phone: [phone]   Fax:  [phone]  Physical Therapy Treatment  Patient Details  Name: CABELLA KIMM MRN: [id number] Date of Birth: [birth year] Referring Provider: Colin Benton, DO  Encounter Date: 06/09/2017      PT End of Session - 06/10/17 2040    Visit Number 8  G1   Number of Visits 15   Date for PT Re-Evaluation 07/05/17   Authorization Type MCR   Authorization Time Period 06-04-17 - 08-03-17   PT Start Time 1535   PT Stop Time 1618   PT Time Calculation (min) 43 min   Equipment Utilized During Treatment Gait belt      Past Medical History:  Diagnosis Date  . Arthritis   . Bronchitis    hx of when smoked   . Cancer ([place]) 04/16/09   kidney cancer - tx by [facility] per her report released from f/u  . Cataracts, bilateral   . Diverticulitis 11/2004  . Endometriosis 1994  . GERD (gastroesophageal reflux disease)    hx hiatal hernia, hx esophageal stricture s/p dilation  . Headache   . Hepatitis   . History of chicken pox   . History of hiatal hernia   . History of measles   . History of mumps as a child   . Hx of hepatitis    with cirrhosis - per her report from [place] and eval in 2013 at [place]  . Hyperlipidemia   . Left ear pain   . Migraines    pt states vesticular migraines has dizziness in relation   . Numbness and tingling    feet bilat has had for 40 years  . Right knee pain   . Scarlet fever   . Shingles   . Urinary tract bacterial infections    hx of  . Vertigo    associated with headache, intermittent, chronic    Past Surgical History:  Procedure Laterality Date  . ABDOMINAL HYSTERECTOMY  1994   TAH/BSO due to endometriosis  . APPENDECTOMY    . CATARACT EXTRACTION W/ INTRAOCULAR LENS IMPLANT Bilateral 05/2015, 04/2015  . DIAGNOSTIC LAPAROSCOPY    . LAPAROSCOPIC PARTIAL COLECTOMY N/A 04/10/2015   Procedure:  LAPAROSCOPIC PARTIAL CECTOMY;  Surgeon: Ralene Ok, MD;  [facility]: [facility];  Service: General;  Laterality: N/A;  . LUNG BIOPSY  1996   Negative  . NEPHRECTOMY  04/16/09   Partial removal due to cancer  . OOPHORECTOMY    . TONSILLECTOMY    . TONSILLECTOMY      There were no vitals filed for this visit.      Subjective Assessment - 06/10/17 2034    Subjective Pt reports she is doing better with balance and walking   Pertinent History idiopathic peripheral neuropathy; pt was prescribed medication for migraines but states medication did not help; pt has sensorineural hearing loss (initally labyrinthitis? with sequelae?); h/o kidney cancer; chronic headaches   Patient Stated Goals "would like to be comfortable enough to not have to use cane and be self sufficient"   Currently in Pain? No/denies                         [facility] Adult PT Treatment/Exercise - 06/10/17 0001      Transfers   Transfers Sit to Stand   Number of Reps Other reps (comment)  5   Comments performed on blue Airex  Ambulation/Gait   Ambulation/Gait Yes   Ambulation/Gait Assistance 4: Min guard   Ambulation/Gait Assistance Details ambulating tossing ball for improved gaze stabilization   Ambulation Distance (Feet) 80 Feet   Assistive device None   Gait Pattern Within Functional Limits   Ambulation Surface Level;Indoor     Knee/Hip Exercises: Aerobic   Nustep Level 4 x 15" with UE's and LE's  no charge as unsupervised             Balance Exercises - 06/10/17 2038      Balance Exercises: Standing   Standing Eyes Opened Wide (BOA);Head turns;Foam/compliant surface;5 reps   Standing Eyes Closed Wide (BOA);Head turns;Foam/compliant surface;5 reps   Tandem Stance Eyes open;3 reps;Foam/compliant surface   SLS Eyes open;Solid surface;2 reps;10 secs   Partial Tandem Stance Eyes open;Foam/compliant surface;2 reps;15 secs  added side to side and up/down head turns                 PT Long Term Goals - 06/04/17 1406      PT LONG TERM GOAL #1   Title Improve TUG score to </= 13.0 secs with cane for reduced fall risk.   Baseline 12.44 secs   Time 4   Period Weeks   Status Achieved     PT LONG TERM GOAL #2   Title Improve DVA to </= 2 line difference for improved gaze stabilization.   Baseline 2 line difference with c/o dizziness upon completion of testing   Status On-going   Target Date 07/05/17     PT LONG TERM GOAL #3   Title Increase gait velocity from 2.52 to >/= 3.1 ft/sec with cane for incr. gait efficiency.   Baseline 10.19 secs = 3.22 ft/sec   Time 4   Period Weeks   Status Achieved   Target Date 07/05/17     PT LONG TERM GOAL #4   Title Pt will report at least 25% improvement in vertigo.   Baseline 45% improvement reported on 06-04-17   Status Achieved     PT LONG TERM GOAL #5   Title Independent in HEP for balance and vestibular exercises.   Status Achieved     PT LONG TERM GOAL #6   Title Pt will report at least 50% improvement in vertigo.   Time 4   Period Weeks   Status New   Target Date 07/05/17     PT LONG TERM GOAL #7   Title Pt will amb. 75' with horizontal head turns without moderate LOB with dizziness rating </= 3/10 intensity.   Time 4   Period Weeks   Status New   Target Date 07/05/17     PT LONG TERM GOAL #8   Title Amb. 300' modified independently without cane on flat,even surface without LOB.   Time 4   Period Weeks   Status New   Target Date 07/05/17               Plan - 06/10/17 2042    Clinical Impression Statement Pt progressing well towards goals - cont to have more difficulty with vertical head turns than with horizontal head turns   Rehab Potential Good   PT Frequency 2x / week   PT Duration 4 weeks   PT Treatment/Interventions ADLs/Self Care Home Management;Gait training;Stair training;Therapeutic activities;Therapeutic exercise;Balance training;Neuromuscular re-education;Patient/family  education;Vestibular   PT Next Visit Plan  cont balance and gait   PT Home Exercise Plan x1 viewing; balance on foam added 05-19-17; added heel lifts  and heel cord stretch on 06-04-17   Consulted and Agree with Plan of Care Patient      Patient will benefit from skilled therapeutic intervention in order to improve the following deficits and impairments:  Abnormal gait, Decreased balance, Difficulty walking, Dizziness  Visit Diagnosis: Unsteadiness on feet     Problem List Patient Active Problem List   Diagnosis Date Noted  . S/P partial resection of colon 04/10/2015  . Cirrhosis ([place]) 12/08/2014  . Esophageal reflux 12/08/2014  . Esophageal stricture 12/08/2014  . Vertigo 12/08/2014    Alda Lea, PT 06/10/2017, 8:46 PM  [facility] [address] [place], [place], [address] Phone: [phone]   Fax:  [phone]  Name: SHAQUELA WEICHERT MRN: [id number] Date of Birth: Sep 27, 1945

## 2017-06-11 ENCOUNTER — Ambulatory Visit: Payer: Medicare Other | Admitting: Physical Therapy

## 2017-06-11 DIAGNOSIS — R2689 Other abnormalities of gait and mobility: Secondary | ICD-10-CM | POA: Diagnosis not present

## 2017-06-11 DIAGNOSIS — R42 Dizziness and giddiness: Secondary | ICD-10-CM | POA: Diagnosis not present

## 2017-06-11 DIAGNOSIS — R2681 Unsteadiness on feet: Secondary | ICD-10-CM

## 2017-06-11 NOTE — Patient Instructions (Addendum)
SINGLE LIMB STANCE    Stance: single leg on floor. Raise leg. Hold _10__ seconds. Repeat with other leg. _2__ reps per set, _1-2__ sets per day, _5Feet Heel-Toe "Tandem"      Copyright  VHI. All rights reserved.  Tandem Stance    Right foot in front of left, heel touching toe both feet "straight ahead". Stand on Foot Triangle of Support with both feet. Balance in this position _30__ seconds. Do with left foot in front of right.  Copyright  VHI. All rights reserved.

## 2017-06-12 NOTE — Therapy (Signed)
[facility] [address] [place], [place], [address] Phone: [phone]   Fax:  [phone]  Physical Therapy Treatment  Patient Details  Name: Brenda [name] MRN: [id number] Date of Birth: 07-29-1946 Referring Provider: Colin Benton, DO  Encounter Date: 06/11/2017      PT End of Session - 06/12/17 1730    Visit Number 9  G2   Number of Visits 15   Date for PT Re-Evaluation 07/05/17   Authorization Type MCR   Authorization Time Period 06-04-17 - 08-03-17   PT Start Time 0935   PT Stop Time 1017   PT Time Calculation (min) 42 min   Equipment Utilized During Treatment Gait belt      Past Medical History:  Diagnosis Date  . Arthritis   . Bronchitis    hx of when smoked   . Cancer ([place]) 04/16/09   kidney cancer - tx by [facility] per her report released from f/u  . Cataracts, bilateral   . Diverticulitis 11/2004  . Endometriosis 1994  . GERD (gastroesophageal reflux disease)    hx hiatal hernia, hx esophageal stricture s/p dilation  . Headache   . Hepatitis   . History of chicken pox   . History of hiatal hernia   . History of measles   . History of mumps as a child   . Hx of hepatitis    with cirrhosis - per her report from [place] and eval in 2013 at [place]  . Hyperlipidemia   . Left ear pain   . Migraines    pt states vesticular migraines has dizziness in relation   . Numbness and tingling    feet bilat has had for 40 years  . Right knee pain   . Scarlet fever   . Shingles   . Urinary tract bacterial infections    hx of  . Vertigo    associated with headache, intermittent, chronic    Past Surgical History:  Procedure Laterality Date  . ABDOMINAL HYSTERECTOMY  1994   TAH/BSO due to endometriosis  . APPENDECTOMY    . CATARACT EXTRACTION W/ INTRAOCULAR LENS IMPLANT Bilateral 05/2015, 04/2015  . DIAGNOSTIC LAPAROSCOPY    . LAPAROSCOPIC PARTIAL COLECTOMY N/A 04/10/2015   Procedure:  LAPAROSCOPIC PARTIAL CECTOMY;  Surgeon: Ralene Ok, MD;  Location: [facility];  Service: General;  Laterality: N/A;  . LUNG BIOPSY  1996   Negative  . NEPHRECTOMY  04/16/09   Partial removal due to cancer  . OOPHORECTOMY    . TONSILLECTOMY    . TONSILLECTOMY      There were no vitals filed for this visit.      Subjective Assessment - 06/12/17 1720    Subjective Pt states the barometric pressure affects her balance - states she was bumping into wall this am   Pertinent History idiopathic peripheral neuropathy; pt was prescribed medication for migraines but states medication did not help; pt has sensorineural hearing loss (initally labyrinthitis? with sequelae?); h/o kidney cancer; chronic headaches   Patient Stated Goals "would like to be comfortable enough to not have to use cane and be self sufficient"   Currently in Pain? No/denies                         [facility] Adult PT Treatment/Exercise - 06/12/17 0001      Ambulation/Gait   Ambulation/Gait Yes   Ambulation/Gait Assistance 4: Min guard   Ambulation Distance (Feet) 200 Feet  Assistive device None   Gait Pattern Within Functional Limits   Ambulation Surface Level;Indoor   Gait Comments pt amb. with horizontal and vertical head turns; 2nd lap with increased speed; practiced quick stops and turn around - 5 reps toward each side             Balance Exercises - 06/12/17 1729      Balance Exercises: Standing   Standing Eyes Closed Wide (BOA);Head turns;Foam/compliant surface;5 reps   Tandem Stance Eyes open;3 reps;Foam/compliant surface   Partial Tandem Stance Eyes open;Foam/compliant surface;2 reps;15 secs  added side to side and up/down head turns   Other Standing Exercises Pt stood on blue balance beam for incr. hip strategy - with head turns side to side and up/down                PT Long Term Goals - 06/04/17 1406      PT LONG TERM GOAL #1   Title Improve TUG score to </= 13.0 secs with  cane for reduced fall risk.   Baseline 12.44 secs   Time 4   Period Weeks   Status Achieved     PT LONG TERM GOAL #2   Title Improve DVA to </= 2 line difference for improved gaze stabilization.   Baseline 2 line difference with c/o dizziness upon completion of testing   Status On-going   Target Date 07/05/17     PT LONG TERM GOAL #3   Title Increase gait velocity from 2.52 to >/= 3.1 ft/sec with cane for incr. gait efficiency.   Baseline 10.19 secs = 3.22 ft/sec   Time 4   Period Weeks   Status Achieved   Target Date 07/05/17     PT LONG TERM GOAL #4   Title Pt will report at least 25% improvement in vertigo.   Baseline 45% improvement reported on 06-04-17   Status Achieved     PT LONG TERM GOAL #5   Title Independent in HEP for balance and vestibular exercises.   Status Achieved     PT LONG TERM GOAL #6   Title Pt will report at least 50% improvement in vertigo.   Time 4   Period Weeks   Status New   Target Date 07/05/17     PT LONG TERM GOAL #7   Title Pt will amb. 91' with horizontal head turns without moderate LOB with dizziness rating </= 3/10 intensity.   Time 4   Period Weeks   Status New   Target Date 07/05/17     PT LONG TERM GOAL #8   Title Amb. 300' modified independently without cane on flat,even surface without LOB.   Time 4   Period Weeks   Status New   Target Date 07/05/17               Plan - 06/12/17 1731    Clinical Impression Statement Pt progressing well - continues to demonstrate decr. vestibular function with incr. c/o dizziness with activities with EC on compliant surfaces   Rehab Potential Good   PT Frequency 2x / week   PT Duration 4 weeks   PT Treatment/Interventions ADLs/Self Care Home Management;Gait training;Stair training;Therapeutic activities;Therapeutic exercise;Balance training;Neuromuscular re-education;Patient/family education;Vestibular   PT Next Visit Plan  cont balance and gait   PT Home Exercise Plan x1  viewing; balance on foam added 05-19-17; added heel lifts and heel cord stretch on 06-04-17   Consulted and Agree with Plan of Care Patient  Patient will benefit from skilled therapeutic intervention in order to improve the following deficits and impairments:  Abnormal gait, Decreased balance, Difficulty walking, Dizziness  Visit Diagnosis: Unsteadiness on feet  Other abnormalities of gait and mobility     Problem List Patient Active Problem List   Diagnosis Date Noted  . S/P partial resection of colon 04/10/2015  . Cirrhosis ([place]) 12/08/2014  . Esophageal reflux 12/08/2014  . Esophageal stricture 12/08/2014  . Vertigo 12/08/2014    Brenda [name], PT 06/12/2017, 5:33 PM  [facility] [address] [place], [place], [address] Phone: [phone]   Fax:  [phone]  Name: Brenda [name] MRN: [id number] Date of Birth: 13-Sep-1945

## 2017-06-17 ENCOUNTER — Ambulatory Visit
Admission: RE | Admit: 2017-06-17 | Discharge: 2017-06-17 | Disposition: A | Discharge status: Home / Self Care | Payer: Medicare Other | Source: Ambulatory Visit | Attending: Obstetrics and Gynecology | Admitting: Obstetrics and Gynecology

## 2017-06-17 DIAGNOSIS — H04123 Dry eye syndrome of bilateral lacrimal glands: Secondary | ICD-10-CM | POA: Diagnosis not present

## 2017-06-17 DIAGNOSIS — Z1231 Encounter for screening mammogram for malignant neoplasm of breast: Secondary | ICD-10-CM

## 2017-06-17 DIAGNOSIS — Z961 Presence of intraocular lens: Secondary | ICD-10-CM | POA: Diagnosis not present

## 2017-06-22 ENCOUNTER — Ambulatory Visit: Payer: Medicare Other | Admitting: Physical Therapy

## 2017-06-23 ENCOUNTER — Encounter: Payer: Self-pay | Admitting: Family Medicine

## 2017-06-23 ENCOUNTER — Ambulatory Visit (INDEPENDENT_AMBULATORY_CARE_PROVIDER_SITE_OTHER)
Admission: RE | Admit: 2017-06-23 | Discharge: 2017-06-23 | Disposition: A | Discharge status: Home / Self Care | Payer: Medicare Other | Source: Ambulatory Visit | Attending: Family Medicine | Admitting: Family Medicine

## 2017-06-23 ENCOUNTER — Ambulatory Visit (INDEPENDENT_AMBULATORY_CARE_PROVIDER_SITE_OTHER): Payer: Medicare Other | Admitting: Family Medicine

## 2017-06-23 VITALS — BP 110/60 | HR 103 | Temp 98.7°F | Ht 63.25 in | Wt 142.4 lb

## 2017-06-23 DIAGNOSIS — R0789 Other chest pain: Secondary | ICD-10-CM

## 2017-06-23 DIAGNOSIS — R05 Cough: Secondary | ICD-10-CM | POA: Diagnosis not present

## 2017-06-23 DIAGNOSIS — R059 Cough, unspecified: Secondary | ICD-10-CM

## 2017-06-23 MED ORDER — ALBUTEROL SULFATE HFA 108 (90 BASE) MCG/ACT IN AERS: 2.0000 | INHALATION_SPRAY | Freq: Four times a day (QID) | RESPIRATORY_TRACT | 2 refills | Status: DC | PRN

## 2017-06-23 MED ORDER — BENZONATATE 100 MG PO CAPS: 100.0000 mg | ORAL_CAPSULE | Freq: Two times a day (BID) | ORAL | 0 refills | Status: DC | PRN

## 2017-06-23 MED ORDER — PREDNISONE 20 MG PO TABS: 40.0000 mg | ORAL_TABLET | Freq: Every day | ORAL | 0 refills | Status: DC

## 2017-06-23 NOTE — Progress Notes (Signed)
HPI:  URI -started:started about 5 days ago -symptoms:nasal congestion, sore throat, cough, laryngitis, some chest tightness with deep breaths -denies:fever, SOB, NVD, tooth pain -has tried: Mucinex -sick contacts/travel/risks: no reported flu, strep or tick exposure -Hx of: remote smoking history and history of bronchitis  ROS: See pertinent positives and negatives per HPI.  Past Medical History:  Diagnosis Date  . Arthritis   . Bronchitis    hx of when smoked   . Cancer ([place]) 04/16/09   kidney cancer - tx [facility] per her report released from f/u  . Cataracts, bilateral   . Diverticulitis 11/2004  . Endometriosis 1994  . GERD (gastroesophageal reflux disease)    hx hiatal hernia, hx esophageal stricture s/p dilation  . Headache   . Hepatitis   . History of chicken pox   . History of hiatal hernia   . History of measles   . History of mumps as a child   . Hx of hepatitis    with cirrhosis - per her report from [place] and eval in 2013 at [place]  . Hyperlipidemia   . Left ear pain   . Migraines    pt states vesticular migraines has dizziness in relation   . Numbness and tingling    feet bilat has had for 40 years  . Right knee pain   . Scarlet fever   . Shingles   . Urinary tract bacterial infections    hx of  . Vertigo    associated with headache, intermittent, chronic    Past Surgical History:  Procedure Laterality Date  . ABDOMINAL HYSTERECTOMY  1994   TAH/BSO due to endometriosis  . APPENDECTOMY    . CATARACT EXTRACTION W/ INTRAOCULAR LENS IMPLANT Bilateral 05/2015, 04/2015  . DIAGNOSTIC LAPAROSCOPY    . LAPAROSCOPIC PARTIAL COLECTOMY N/A 04/10/2015   Procedure: LAPAROSCOPIC PARTIAL CECTOMY;  Surgeon: Ralene Ok, MD;  Location: [facility];  Service: General;  Laterality: N/A;  . LUNG BIOPSY  1996   Negative  . NEPHRECTOMY  04/16/09   Partial removal due to cancer  . OOPHORECTOMY    . TONSILLECTOMY    . TONSILLECTOMY      Family History   Problem Relation Age of Onset  . Cancer Father   . Thyroid cancer Mother   . Cancer Mother        thyroid/ renal cell ca   . Colon cancer Neg Hx     Social History   Social History  . Marital status: Married    Spouse name: N/A  . Number of children: 0  . Years of education: N/A   Occupational History  . Retired Retired   Social History Main Topics  . Smoking status: Former Smoker    Packs/day: 1.50    Years: 20.00    Types: Cigarettes    Quit date: 08/25/1990  . Smokeless tobacco: Never Used  . Alcohol use No     Comment: Rare  . Drug use: No  . Sexual activity: No     Comment: TAH   Other Topics Concern  . None   Social History Narrative  . None     Current Outpatient Prescriptions:  .  cycloSPORINE (RESTASIS) 0.05 % ophthalmic emulsion, Apply to eye 2 (two) times daily., Disp: , Rfl:  .  Multiple Vitamin (MULTIVITAMIN) tablet, Take 1 tablet by mouth daily.  , Disp: , Rfl:  .  omeprazole (PRILOSEC) 20 MG capsule, Take 1 capsule by mouth daily., Disp: ,  Rfl:  .  rosuvastatin (CRESTOR) 10 MG tablet, Take 1 tablet (10 mg total) by mouth daily., Disp: 30 tablet, Rfl: 3 .  venlafaxine XR (EFFEXOR-XR) 75 MG 24 hr capsule, Take 75 mg by mouth daily., Disp: , Rfl: 3 .  YUVAFEM 10 MCG TABS vaginal tablet, USE TWICE WEEKLY AS DIRECTED, Disp: 24 tablet, Rfl: 3 .  albuterol (PROVENTIL HFA;VENTOLIN HFA) 108 (90 Base) MCG/ACT inhaler, Inhale 2 puffs into the lungs every 6 (six) hours as needed for wheezing or shortness of breath., Disp: 1 Inhaler, Rfl: 2 .  benzonatate (TESSALON) 100 MG capsule, Take 1 capsule (100 mg total) by mouth 2 (two) times daily as needed for cough., Disp: 20 capsule, Rfl: 0 .  predniSONE (DELTASONE) 20 MG tablet, Take 2 tablets (40 mg total) by mouth daily with breakfast., Disp: 8 tablet, Rfl: 0  EXAM:  Vitals:   06/23/17 1251  BP: 110/60  Pulse: (!) 103  Temp: 98.7 F (37.1 C)  SpO2: 98%    Body mass index is 25.03 kg/m.  GENERAL: vitals  reviewed and listed above, alert, oriented, appears well hydrated and in no acute distress  HEENT: atraumatic, conjunttiva clear, no obvious abnormalities on inspection of external nose and ears, normal appearance of ear canals and TMs, clear nasal congestion, mild post oropharyngeal erythema with PND, no tonsillar edema or exudate, no sinus TTP  NECK: no obvious masses on inspection  LUNGS: clear to auscultation bilaterally, no wheezes, rales or rhonchi, good air movement  CV: HRRR, no peripheral edema  MS: moves all extremities without noticeable abnormality  PSYCH: pleasant and cooperative, no obvious depression or anxiety  ASSESSMENT AND PLAN:  Discussed the following assessment and plan:  Cough - Plan: DG Chest 2 View  Chest tightness - Plan: DG Chest 2 View  -given HPI and exam findings today, a serious infection or illness is unlikely. We discussed potential etiologies, with VURI/laryngitis and possible bronchitis being most likely. We will get chest x-ray to exclude other. Also will treat with albuterol as needed, prednisone if needed, cough medication after discussion of risk and benefits.We discussed treatment side effects, likely course, antibiotic misuse, transmission, return and emergency precautions and signs of developing a serious illness. -of course, we advised to return or notify a doctor immediately if symptoms worsen or persist or new concerns arise.    Patient Instructions  BEFORE YOU LEAVE: -xray sheet -follow up: for this problem as needed  Go get the chest xray  The tessalon is to use for cough as needed per instructions.  Try the albuterol for excessive cough or tightness to see this helps. If not, please start the prednisone per instructions.  We have ordered a chest x-ray today. It usually takes a day or 2 for [place] to receive the radiology report and contact you about your results. If you have not heard from [place] or cannot find your results in [facility] in  the next 1-2 days please contact our office at [phone].  If you are not yet signed up for [facility], please consider signing up.           Colin Benton R., DO

## 2017-06-23 NOTE — Patient Instructions (Signed)
BEFORE YOU LEAVE: -xray sheet -follow up: for this problem as needed  Go get the chest xray  The tessalon is to use for cough as needed per instructions.  Try the albuterol for excessive cough or tightness to see this helps. If not, please start the prednisone per instructions.  We have ordered a chest x-ray today. It usually takes a day or 2 for [place] to receive the radiology report and contact you about your results. If you have not heard from [place] or cannot find your results in [facility] in the next 1-2 days please contact our office at [phone].  If you are not yet signed up for [facility], please consider signing up.

## 2017-06-25 ENCOUNTER — Encounter: Payer: Medicare Other | Admitting: Physical Therapy

## 2017-06-26 ENCOUNTER — Ambulatory Visit: Payer: Medicare Other | Admitting: Nurse Practitioner

## 2017-06-30 ENCOUNTER — Encounter: Payer: Self-pay | Admitting: Physical Therapy

## 2017-06-30 ENCOUNTER — Ambulatory Visit: Payer: Medicare Other | Attending: Family Medicine | Admitting: Physical Therapy

## 2017-06-30 DIAGNOSIS — R42 Dizziness and giddiness: Secondary | ICD-10-CM | POA: Insufficient documentation

## 2017-06-30 DIAGNOSIS — R2681 Unsteadiness on feet: Secondary | ICD-10-CM | POA: Diagnosis not present

## 2017-06-30 DIAGNOSIS — R2689 Other abnormalities of gait and mobility: Secondary | ICD-10-CM | POA: Insufficient documentation

## 2017-07-01 ENCOUNTER — Encounter: Payer: Self-pay | Admitting: Physical Therapy

## 2017-07-01 NOTE — Therapy (Signed)
[facility] [address] [place], [place], [address] Phone: [phone]   Fax:  [phone]  Physical Therapy Treatment  Patient Details  Name: Brenda [name] MRN: [id number] Date of Birth: 12-Mar-1946 Referring Provider: Colin Benton, DO   Encounter Date: 06/30/2017  PT End of Session - 07/01/17 1532    Visit Number  10 G3   G3   Number of Visits  15    Date for PT Re-Evaluation  07/05/17    Authorization Type  MCR    Authorization Time Period  06-04-17 - 08-03-17    PT Start Time  1315    PT Stop Time  1400    PT Time Calculation (min)  45 min    Equipment Utilized During Treatment  Gait belt       Past Medical History:  Diagnosis Date  . Arthritis   . Bronchitis    hx of when smoked   . Cancer ([place]) 04/16/09   kidney cancer - [facility] per her report released from f/u  . Cataracts, bilateral   . Diverticulitis 11/2004  . Endometriosis 1994  . GERD (gastroesophageal reflux disease)    hx hiatal hernia, hx esophageal stricture s/p dilation  . Headache   . Hepatitis   . History of chicken pox   . History of hiatal hernia   . History of measles   . History of mumps as a child   . Hx of hepatitis    with cirrhosis - per her report from [place] and eval in 2013 at [facility]  . Hyperlipidemia   . Left ear pain   . Migraines    pt states vesticular migraines has dizziness in relation   . Numbness and tingling    feet bilat has had for 40 years  . Right knee pain   . Scarlet fever   . Shingles   . Urinary tract bacterial infections    hx of  . Vertigo    associated with headache, intermittent, chronic    Past Surgical History:  Procedure Laterality Date  . ABDOMINAL HYSTERECTOMY  1994   TAH/BSO due to endometriosis  . APPENDECTOMY    . CATARACT EXTRACTION W/ INTRAOCULAR LENS IMPLANT Bilateral 05/2015, 04/2015  . DIAGNOSTIC LAPAROSCOPY    . LUNG BIOPSY  1996   Negative  . NEPHRECTOMY   04/16/09   Partial removal due to cancer  . OOPHORECTOMY    . TONSILLECTOMY    . TONSILLECTOMY      There were no vitals filed for this visit.  Subjective Assessment - 07/01/17 1529    Subjective  Pt reports she feels balance is not quite as good as what it was prior to her getting sick    Pertinent History  idiopathic peripheral neuropathy; pt was prescribed medication for migraines but states medication did not help; pt has sensorineural hearing loss (initally labyrinthitis? with sequelae?); h/o kidney cancer; chronic headaches    Patient Stated Goals  "would like to be comfortable enough to not have to use cane and be self sufficient"    Currently in Pain?  No/denies                      [facility] Adult PT Treatment/Exercise - 07/01/17 0001      Ambulation/Gait   Ambulation/Gait  Yes    Ambulation/Gait Assistance  4: Min guard    Ambulation Distance (Feet)  200 Feet    Assistive device  None    Gait Pattern  Within Functional Limits    Ambulation Surface  Level;Indoor    Gait Comments  Pt amb. tossing ball up and catching, making circles clockwise and counterclockwise around track with CGA for safety          Balance Exercises - 07/01/17 1531      Balance Exercises: Standing   Standing Eyes Opened  Wide (BOA);Head turns;Foam/compliant surface;5 reps    Standing Eyes Closed  Wide (BOA);Head turns;Foam/compliant surface;5 reps    Tandem Stance  Eyes open;3 reps;Foam/compliant surface    Rockerboard  Anterior/posterior;EO;EC;30 seconds    Partial Tandem Stance  Eyes open;Foam/compliant surface;2 reps;15 secs added side to side and up/down head turns   added side to side and up/down head turns   Other Standing Exercises  balance exercises performed in corner with EO and eC - touching each wall and diagonals 5 reps each              PT Long Term Goals - 06/04/17 1406      PT LONG TERM GOAL #1   Title  Improve TUG score to </= 13.0 secs with cane for  reduced fall risk.    Baseline  12.44 secs    Time  4    Period  Weeks    Status  Achieved      PT LONG TERM GOAL #2   Title  Improve DVA to </= 2 line difference for improved gaze stabilization.    Baseline  2 line difference with c/o dizziness upon completion of testing    Status  On-going    Target Date  07/05/17      PT LONG TERM GOAL #3   Title  Increase gait velocity from 2.52 to >/= 3.1 ft/sec with cane for incr. gait efficiency.    Baseline  10.19 secs = 3.22 ft/sec    Time  4    Period  Weeks    Status  Achieved    Target Date  07/05/17      PT LONG TERM GOAL #4   Title  Pt will report at least 25% improvement in vertigo.    Baseline  45% improvement reported on 06-04-17    Status  Achieved      PT LONG TERM GOAL #5   Title  Independent in HEP for balance and vestibular exercises.    Status  Achieved      PT LONG TERM GOAL #6   Title  Pt will report at least 50% improvement in vertigo.    Time  4    Period  Weeks    Status  New    Target Date  07/05/17      PT LONG TERM GOAL #7   Title  Pt will amb. 51' with horizontal head turns without moderate LOB with dizziness rating </= 3/10 intensity.    Time  4    Period  Weeks    Status  New    Target Date  07/05/17      PT LONG TERM GOAL #8   Title  Amb. 300' modified independently without cane on flat,even surface without LOB.    Time  4    Period  Weeks    Status  New    Target Date  07/05/17            Plan - 07/01/17 1533    Clinical Impression Statement  Pt progressing well towards goals - continues to have some  unsteadiness with EC with standing on compliant surfaces; pt reported fatigue at end of session    Rehab Potential  Good    PT Frequency  2x / week    PT Duration  4 weeks    PT Treatment/Interventions  ADLs/Self Care Home Management;Gait training;Stair training;Therapeutic activities;Therapeutic exercise;Balance training;Neuromuscular re-education;Patient/family education;Vestibular    PT  Next Visit Plan   cont balance and gait    PT Home Exercise Plan  x1 viewing; balance on foam added 05-19-17; added heel lifts and heel cord stretch on 06-04-17    Consulted and Agree with Plan of Care  Patient       Patient will benefit from skilled therapeutic intervention in order to improve the following deficits and impairments:  Abnormal gait, Decreased balance, Difficulty walking, Dizziness  Visit Diagnosis: Unsteadiness on feet  Dizziness and giddiness     Problem List Patient Active Problem List   Diagnosis Date Noted  . S/P partial resection of colon 04/10/2015  . Cirrhosis ([place]) 12/08/2014  . Esophageal reflux 12/08/2014  . Esophageal stricture 12/08/2014  . Vertigo 12/08/2014    Alda Lea, PT 07/01/2017, 3:35 PM  [facility] [address] [place], [place], [address] Phone: [phone]   Fax:  [phone]  Name: Brenda [name] MRN: [id number] Date of Birth: 11-06-1945

## 2017-07-02 ENCOUNTER — Encounter: Payer: Self-pay | Admitting: Physical Therapy

## 2017-07-02 ENCOUNTER — Ambulatory Visit: Payer: Medicare Other | Admitting: Physical Therapy

## 2017-07-02 DIAGNOSIS — R2681 Unsteadiness on feet: Secondary | ICD-10-CM

## 2017-07-02 DIAGNOSIS — R42 Dizziness and giddiness: Secondary | ICD-10-CM

## 2017-07-02 DIAGNOSIS — R2689 Other abnormalities of gait and mobility: Secondary | ICD-10-CM | POA: Diagnosis not present

## 2017-07-03 ENCOUNTER — Ambulatory Visit (INDEPENDENT_AMBULATORY_CARE_PROVIDER_SITE_OTHER): Payer: Medicare Other | Admitting: Obstetrics and Gynecology

## 2017-07-03 ENCOUNTER — Other Ambulatory Visit: Payer: Self-pay

## 2017-07-03 ENCOUNTER — Encounter: Payer: Self-pay | Admitting: Obstetrics and Gynecology

## 2017-07-03 VITALS — BP 122/62 | HR 72 | Resp 16 | Ht 63.25 in | Wt 142.0 lb

## 2017-07-03 DIAGNOSIS — Z124 Encounter for screening for malignant neoplasm of cervix: Secondary | ICD-10-CM | POA: Diagnosis not present

## 2017-07-03 DIAGNOSIS — Z01419 Encounter for gynecological examination (general) (routine) without abnormal findings: Secondary | ICD-10-CM | POA: Diagnosis not present

## 2017-07-03 MED ORDER — ESTRADIOL 10 MCG VA TABS: ORAL_TABLET | VAGINAL | 3 refills | Status: DC

## 2017-07-03 NOTE — Progress Notes (Signed)
71 y.o. G0P0000 Married Caucasian female here for annual exam.    Dx with vertigo.  Evaluation from [facility]. Also dx with peripheral neuropathy.  Doing vestibular rehab through [facility]. Also taking antidepressant, Effexor,  to help with her symptoms.  Having hot flashes off of her ERT.  Takes Effexor in the evening.   On Vagifem for atrophy and urinary stress incontinence.  ROS - negative.   SOC - mother is 1 and in hospice care in [facility].  PCP:   Dr. Maudie Mercury  Patient's last menstrual period was 08/25/1992.           Sexually active: No.  The current method of family planning is status post hysterectomy.    Exercising: Yes.    vestibular rehab exercise Smoker:  no  Health Maintenance: Pap: 04/2008 Neg History of abnormal Pap:  no MMG: 06-17-17 Density B/Neg/BiRads1:TBC Colonoscopy:03/07/15 precancerous polyps. Repeat 3 years   BMD: 11-22-09  Result : T Score, 0.6 Spine / 0.5 Left Femur Neck TDaP: 08-21-11 Gardasil:   n/a YBO:FBPZW Hep C:patient states that she has had hepatitis in the past Screening Labs: PCP   reports that she quit smoking about 26 years ago. Her smoking use included cigarettes. She has a 30.00 pack-year smoking history. she has never used smokeless tobacco. She reports that she does not drink alcohol or use drugs.  Past Medical History:  Diagnosis Date  . Arthritis   . Bronchitis    hx of when smoked   . Cancer ([place]) 04/16/09   kidney cancer - [facility] per her report released from f/u  . Cataracts, bilateral   . Diverticulitis 11/2004  . Endometriosis 1994  . GERD (gastroesophageal reflux disease)    hx hiatal hernia, hx esophageal stricture s/p dilation  . Headache   . Hepatitis   . History of chicken pox   . History of hiatal hernia   . History of measles   . History of mumps as a child   . Hx of hepatitis    with cirrhosis - per her report from [place] and eval in 2013 at [place]  . Hyperlipidemia   . Left ear pain   . Migraines     pt states vesticular migraines has dizziness in relation   . Numbness and tingling    feet bilat has had for 40 years  . Right knee pain   . Scarlet fever   . Shingles   . Urinary tract bacterial infections    hx of  . Vertigo    associated with headache, intermittent, chronic    Past Surgical History:  Procedure Laterality Date  . ABDOMINAL HYSTERECTOMY  1994   TAH/BSO due to endometriosis  . APPENDECTOMY    . CATARACT EXTRACTION W/ INTRAOCULAR LENS IMPLANT Bilateral 05/2015, 04/2015  . DIAGNOSTIC LAPAROSCOPY    . LUNG BIOPSY  1996   Negative  . NEPHRECTOMY  04/16/09   Partial removal due to cancer  . OOPHORECTOMY    . TONSILLECTOMY    . TONSILLECTOMY      Current Outpatient Medications  Medication Sig Dispense Refill  . cycloSPORINE (RESTASIS) 0.05 % ophthalmic emulsion Apply to eye 2 (two) times daily.    . Multiple Vitamin (MULTIVITAMIN) tablet Take 1 tablet by mouth daily.      Marland Kitchen omeprazole (PRILOSEC) 20 MG capsule Take 1 capsule by mouth daily.    Marland Kitchen venlafaxine XR (EFFEXOR-XR) 75 MG 24 hr capsule Take 75 mg by mouth daily.  3  .  YUVAFEM 10 MCG TABS vaginal tablet USE TWICE WEEKLY AS DIRECTED 24 tablet 3  . rosuvastatin (CRESTOR) 10 MG tablet Take 1 tablet (10 mg total) by mouth daily. (Patient not taking: Reported on 07/03/2017) 30 tablet 3   No current facility-administered medications for this visit.     Family History  Problem Relation Age of Onset  . Cancer Father   . Thyroid cancer Mother   . Cancer Mother        thyroid/ renal cell ca   . Colon cancer Neg Hx     ROS:  Pertinent items are noted in HPI.  Otherwise, a comprehensive ROS was negative.  Exam:   BP 122/62 (BP Location: Right Arm, Patient Position: Sitting, Cuff Size: Normal)   Pulse 72   Resp 16   Ht 5' 3.25" (1.607 m)   Wt 142 lb (64.4 kg)   LMP 08/25/1992   BMI 24.96 kg/m     General appearance: alert, cooperative and appears stated age Head: Normocephalic, without obvious  abnormality, atraumatic Neck: no adenopathy, supple, symmetrical, trachea midline and thyroid normal to inspection and palpation Lungs: clear to auscultation bilaterally Breasts: normal appearance, no masses or tenderness, No nipple retraction or dimpling, No nipple discharge or bleeding, No axillary or supraclavicular adenopathy Heart: regular rate and rhythm Abdomen: soft, non-tender; no masses, no organomegaly Extremities: extremities normal, atraumatic, no cyanosis or edema Skin: Skin color, texture, turgor normal. No rashes or lesions Lymph nodes: Cervical, supraclavicular, and axillary nodes normal. No abnormal inguinal nodes palpated Neurologic: Grossly normal  Pelvic: External genitalia:  no lesions              Urethra:  normal appearing urethra with no masses, tenderness or lesions              Bartholins and Skenes: normal                 Vagina: normal appearing vagina with normal color and discharge, no lesions              Cervix: absent.                Pap taken: No. Bimanual Exam:  Uterus:   Absent.               Adnexa: no mass, fullness, tenderness              Rectal exam: Yes.  .  Confirms.              Anus:  normal sphincter tone, no lesions  Chaperone was present for exam.  Assessment:   Well woman visit with normal exam. Status post TAH/BSO for endometriosis. Hot flashes at night when Effexor is due.  On Vagifem.  Status post partial colectomy/appendectomy due to neoplastic polyp found on colonoscopy. Status post partial nephrectomy for cancer.   Plan: Mammogram screening discussed. Recommended self breast awareness. Pap and HR HPV as above. Guidelines for Calcium, Vitamin D, regular exercise program including cardiovascular and weight bearing exercise. Refill of Vagifem for one year.  I discussed potential increased risk of breast cancer. Take Effexor at lunch time.  I discussed the benefits of this to treat hot flashes. Follow up annually and prn.    After visit summary provided.

## 2017-07-03 NOTE — Patient Instructions (Signed)

## 2017-07-03 NOTE — Therapy (Signed)
[facility] [address] [place], [place], [address] Phone: [phone]   Fax:  [phone]  Physical Therapy Treatment  Patient Details  Name: Brenda [name] MRN: [id number] Date of Birth: 06-11-1946 Referring Provider: Colin Benton, DO   Encounter Date: 07/02/2017  PT End of Session - 07/03/17 0842    Visit Number  11 G4    Number of Visits  15    Date for PT Re-Evaluation  08/03/17    Authorization Type  MCR    Authorization Time Period  06-04-17 - 08-03-17    PT Start Time  1315    PT Stop Time  1400    PT Time Calculation (min)  45 min    Equipment Utilized During Treatment  Gait belt       Past Medical History:  Diagnosis Date  . Arthritis   . Bronchitis    hx of when smoked   . Cancer ([place]) 04/16/09   kidney cancer - [facility] per her report released from f/u  . Cataracts, bilateral   . Diverticulitis 11/2004  . Endometriosis 1994  . GERD (gastroesophageal reflux disease)    hx hiatal hernia, hx esophageal stricture s/p dilation  . Headache   . Hepatitis   . History of chicken pox   . History of hiatal hernia   . History of measles   . History of mumps as a child   . Hx of hepatitis    with cirrhosis - per her report from [place] and eval in 2013 at [place]  . Hyperlipidemia   . Left ear pain   . Migraines    pt states vesticular migraines has dizziness in relation   . Numbness and tingling    feet bilat has had for 40 years  . Right knee pain   . Scarlet fever   . Shingles   . Urinary tract bacterial infections    hx of  . Vertigo    associated with headache, intermittent, chronic    Past Surgical History:  Procedure Laterality Date  . ABDOMINAL HYSTERECTOMY  1994   TAH/BSO due to endometriosis  . APPENDECTOMY    . CATARACT EXTRACTION W/ INTRAOCULAR LENS IMPLANT Bilateral 05/2015, 04/2015  . DIAGNOSTIC LAPAROSCOPY    . LUNG BIOPSY  1996   Negative  . NEPHRECTOMY   04/16/09   Partial removal due to cancer  . OOPHORECTOMY    . TONSILLECTOMY    . TONSILLECTOMY      There were no vitals filed for this visit.  Subjective Assessment - 07/02/17 1639    Subjective  Pt reports she is feeling better today - states she slightly overdid it yesterday as she was cleaning carpet at her mom's house and putting a cover on the refridgerator door which had to be smoothed out, requiring her to bend over     Pertinent History  idiopathic peripheral neuropathy; pt was prescribed medication for migraines but states medication did not help; pt has sensorineural hearing loss (initally labyrinthitis? with sequelae?); h/o kidney cancer; chronic headaches    Patient Stated Goals  "would like to be comfortable enough to not have to use cane and be self sufficient"    Currently in Pain?  No/denies                      [facility] Adult PT Treatment/Exercise - 07/02/17 1353      Ambulation/Gait   Ambulation/Gait  Yes  Ambulation/Gait Assistance  4: Min guard    Ambulation Distance (Feet)  150 Feet    Assistive device  None    Gait Pattern  Within Functional Limits    Ambulation Surface  Level;Indoor    Gait Comments  Pt amb. tossing ball up and catching, making circles clockwise and counterclockwise around track with CGA for safety       above gait activity performed for improved dynamic balance and for improved gaze stabilization    Balance Exercises - 07/02/17 1653      Balance Exercises: Standing   Standing Eyes Opened  Wide (BOA);Head turns;Foam/compliant surface;5 reps  (Pended)     Standing Eyes Closed  Narrow base of support (BOS);Wide (BOA);Head turns;5 reps  (Pended)     Other Standing Exercises  standing on foam with EO and EC with feet apart and feet together; with head turns horizontal and vertical        (Pended)       Pt performed standing balance activity for incr. Vestibular input - standing on blue mat on incline and on decline - with EO  and EC - marching in place, alternate stepping up/back and down/back with horizontal and vertical head turns -  With EO and EC       PT Long Term Goals - 07/03/17 0847      PT LONG TERM GOAL #1   Title  Improve TUG score to </= 13.0 secs with cane for reduced fall risk.    Baseline  12.44 secs    Status  Achieved      PT LONG TERM GOAL #2   Title  Improve DVA to </= 2 line difference for improved gaze stabilization.    Baseline  2 line difference with c/o dizziness upon completion of testing - rates dizziness 3/10 -- 07-02-17    Status  Partially Met    Target Date  07/02/17      PT LONG TERM GOAL #3   Title  Increase gait velocity from 2.52 to >/= 3.1 ft/sec with cane for incr. gait efficiency.    Status  Achieved      PT LONG TERM GOAL #4   Title  Pt will report at least 25% improvement in vertigo.    Baseline  45% improvement reported on 06-04-17    Status  Achieved      PT LONG TERM GOAL #5   Title  Independent in HEP for balance and vestibular exercises.    Status  Achieved      PT LONG TERM GOAL #6   Title  Pt will report at least 50% improvement in vertigo.      PT LONG TERM GOAL #7   Title  Pt will amb. 37' with horizontal head turns without moderate LOB with dizziness rating </= 3/10 intensity.    Status  On-going      PT LONG TERM GOAL #8   Title  Amb. 300' modified independently without cane on flat,even surface without LOB.    Status  On-going              Patient will benefit from skilled therapeutic intervention in order to improve the following deficits and impairments:     Visit Diagnosis: Unsteadiness on feet  Other abnormalities of gait and mobility  Dizziness and giddiness     Problem List Patient Active Problem List   Diagnosis Date Noted  . S/P partial resection of colon 04/10/2015  . Cirrhosis ([place]) 12/08/2014  .  Esophageal reflux 12/08/2014  . Esophageal stricture 12/08/2014  . Vertigo 12/08/2014    Alda Lea, PT 07/03/2017, 8:55 AM  [facility] [address] [place], [place], [address] Phone: [phone]   Fax:  [phone]  Name: Brenda [name] MRN: [id number] Date of Birth: 1945/11/02

## 2017-07-07 ENCOUNTER — Ambulatory Visit: Payer: Medicare Other | Admitting: Physical Therapy

## 2017-07-07 ENCOUNTER — Encounter: Payer: Self-pay | Admitting: Physical Therapy

## 2017-07-07 DIAGNOSIS — R42 Dizziness and giddiness: Secondary | ICD-10-CM | POA: Diagnosis not present

## 2017-07-07 DIAGNOSIS — R2689 Other abnormalities of gait and mobility: Secondary | ICD-10-CM | POA: Diagnosis not present

## 2017-07-07 DIAGNOSIS — R2681 Unsteadiness on feet: Secondary | ICD-10-CM | POA: Diagnosis not present

## 2017-07-07 NOTE — Therapy (Signed)
[facility] [address] [place], [place], [address] Phone: [phone]   Fax:  [phone]  Physical Therapy Treatment  Patient Details  Name: Brenda [name] MRN: [id number] Date of Birth: 19-Sep-1945 Referring Provider: Colin Benton, DO   Encounter Date: 07/07/2017  PT End of Session - 07/07/17 1912    Visit Number  12 G5    Number of Visits  15    Date for PT Re-Evaluation  08/03/17    Authorization Type  MCR    Authorization Time Period  06-04-17 - 08-03-17    PT Start Time  1316    PT Stop Time  1401    PT Time Calculation (min)  45 min    Equipment Utilized During Treatment  Gait belt       Past Medical History:  Diagnosis Date  . Arthritis   . Bronchitis    hx of when smoked   . Cancer ([place]) 04/16/09   kidney cancer - [facility] per her report released from f/u  . Cataracts, bilateral   . Diverticulitis 11/2004  . Endometriosis 1994  . GERD (gastroesophageal reflux disease)    hx hiatal hernia, hx esophageal stricture s/p dilation  . Headache   . Hepatitis   . History of chicken pox   . History of hiatal hernia   . History of measles   . History of mumps as a child   . Hx of hepatitis    with cirrhosis - per her report from [place] and eval in 2013 at [place]  . Hyperlipidemia   . Left ear pain   . Migraines    pt states vesticular migraines has dizziness in relation   . Numbness and tingling    feet bilat has had for 40 years  . Right knee pain   . Scarlet fever   . Shingles   . Urinary tract bacterial infections    hx of  . Vertigo    associated with headache, intermittent, chronic    Past Surgical History:  Procedure Laterality Date  . ABDOMINAL HYSTERECTOMY  1994   TAH/BSO due to endometriosis  . APPENDECTOMY    . CATARACT EXTRACTION W/ INTRAOCULAR LENS IMPLANT Bilateral 05/2015, 04/2015  . DIAGNOSTIC LAPAROSCOPY    . LUNG BIOPSY  1996   Negative  . NEPHRECTOMY   04/16/09   Partial removal due to cancer  . OOPHORECTOMY    . TONSILLECTOMY    . TONSILLECTOMY      There were no vitals filed for this visit.  Subjective Assessment - 07/07/17 1908    Subjective  Pt reports she is dizzy from standing approx. 5" in line for checking in just now - states she is unable to just stand still in one place; pt states she was feeling good today - not using cane today    Pertinent History  idiopathic peripheral neuropathy; pt was prescribed medication for migraines but states medication did not help; pt has sensorineural hearing loss (initally labyrinthitis? with sequelae?); h/o kidney cancer; chronic headaches    Patient Stated Goals  "would like to be comfortable enough to not have to use cane and be self sufficient"    Currently in Pain?  No/denies          NeuroRe-ed:   Other Standing Exercises  balance exercises performed in corner with EO and eC - touching each wall and diagonals 5 reps each       Pt performed standing  balance activity for incr. Vestibular input - standing on blue mat on incline and on decline - with EO and EC - marching in place, alternate stepping up/back and down/back with horizontal and vertical head turns -  With EO and EC             [facility] Adult PT Treatment/Exercise - 07/07/17 1358      Ambulation/Gait   Ambulation/Gait  Yes    Ambulation/Gait Assistance  5: Supervision    Ambulation Distance (Feet)  230 Feet    Assistive device  None    Gait Pattern  Within Functional Limits    Ambulation Surface  Level;Indoor          Balance Exercises - 07/07/17 1912      Balance Exercises: Standing   Standing Eyes Opened  Wide ([place]);Head turns;Foam/compliant surface;5 reps    Standing Eyes Closed  Narrow base of support (BOS);Wide (BOA);Head turns;5 reps    Tandem Stance  Eyes open;3 reps;Foam/compliant surface    Partial Tandem Stance  Eyes open;Foam/compliant surface;2 reps;15 secs added side to side and up/down  head turns    Other Standing Exercises  balance exercises performed in corner with EO and eC - touching each wall and diagonals 5 reps each              PT Long Term Goals - 07/03/17 0847      PT LONG TERM GOAL #1   Title  Improve TUG score to </= 13.0 secs with cane for reduced fall risk.    Baseline  12.44 secs    Status  Achieved      PT LONG TERM GOAL #2   Title  Improve DVA to </= 2 line difference for improved gaze stabilization.    Baseline  2 line difference with c/o dizziness upon completion of testing - rates dizziness 3/10 -- 07-02-17    Status  Partially Met    Target Date  07/02/17      PT LONG TERM GOAL #3   Title  Increase gait velocity from 2.52 to >/= 3.1 ft/sec with cane for incr. gait efficiency.    Status  Achieved      PT LONG TERM GOAL #4   Title  Pt will report at least 25% improvement in vertigo.    Baseline  45% improvement reported on 06-04-17    Status  Achieved      PT LONG TERM GOAL #5   Title  Independent in HEP for balance and vestibular exercises.    Status  Achieved      PT LONG TERM GOAL #6   Title  Pt will report at least 50% improvement in vertigo.      PT LONG TERM GOAL #7   Title  Pt will amb. 20' with horizontal head turns without moderate LOB with dizziness rating </= 3/10 intensity.    Status  On-going      PT LONG TERM GOAL #8   Title  Amb. 300' modified independently without cane on flat,even surface without LOB.    Status  On-going            Plan - 07/07/17 1913    Clinical Impression Statement  Pt progressing with balance and gait as pt not using cane for assistance with amb. today, however, reports dizziness occurred with static standing in line for approx. 5" while waiting to check in for today's appt.  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     Rehab Potential  Good    PT Frequency  2x / week    PT Duration  4 weeks    PT Treatment/Interventions   ADLs/Self Care Home Management;Gait training;Stair training;Therapeutic activities;Therapeutic exercise;Balance training;Neuromuscular re-education;Patient/family education;Vestibular    PT Next Visit Plan   cont balance and gait    PT Home Exercise Plan  x1 viewing; balance on foam added 05-19-17; added heel lifts and heel cord stretch on 06-04-17       Patient will benefit from skilled therapeutic intervention in order to improve the following deficits and impairments:  Abnormal gait, Decreased balance, Difficulty walking, Dizziness  Visit Diagnosis: Unsteadiness on feet  Other abnormalities of gait and mobility  Dizziness and giddiness     Problem List Patient Active Problem List   Diagnosis Date Noted  . S/P partial resection of colon 04/10/2015  . Cirrhosis ([place]) 12/08/2014  . Esophageal reflux 12/08/2014  . Esophageal stricture 12/08/2014  . Vertigo 12/08/2014    Alda Lea, PT 07/07/2017, 7:18 PM  [facility] [address] [place], [place], [address] Phone: [phone]   Fax:  [phone]  Name: Brenda [name] MRN: [id number] Date of Birth: 1946/06/16

## 2017-07-08 DIAGNOSIS — G43109 Migraine with aura, not intractable, without status migrainosus: Secondary | ICD-10-CM | POA: Diagnosis not present

## 2017-07-08 DIAGNOSIS — G629 Polyneuropathy, unspecified: Secondary | ICD-10-CM | POA: Diagnosis not present

## 2017-07-08 DIAGNOSIS — R768 Other specified abnormal immunological findings in serum: Secondary | ICD-10-CM | POA: Diagnosis not present

## 2017-07-09 ENCOUNTER — Encounter: Payer: Medicare Other | Admitting: Physical Therapy

## 2017-07-13 ENCOUNTER — Ambulatory Visit: Payer: Medicare Other | Admitting: Physical Therapy

## 2017-07-13 DIAGNOSIS — R42 Dizziness and giddiness: Secondary | ICD-10-CM | POA: Diagnosis not present

## 2017-07-13 DIAGNOSIS — R2689 Other abnormalities of gait and mobility: Secondary | ICD-10-CM

## 2017-07-13 DIAGNOSIS — R2681 Unsteadiness on feet: Secondary | ICD-10-CM | POA: Diagnosis not present

## 2017-07-14 ENCOUNTER — Encounter: Payer: Self-pay | Admitting: Physical Therapy

## 2017-07-14 NOTE — Therapy (Signed)
[facility] [address] [place], [place], [address] Phone: [phone]   Fax:  [phone]  Physical Therapy Treatment  Patient Details  Name: LAPORSCHA LINEHAN MRN: [id number] Date of Birth: December 10, 1945 Referring Provider: Colin Benton, DO   Encounter Date: 07/13/2017  PT End of Session - 07/14/17 2034    Visit Number  13 G6    Number of Visits  15    Date for PT Re-Evaluation  08/03/17    Authorization Type  MCR    Authorization Time Period  06-04-17 - 08-03-17    PT Start Time  1405    PT Stop Time  1448    PT Time Calculation (min)  43 min       Past Medical History:  Diagnosis Date  . Arthritis   . Bronchitis    hx of when smoked   . Cancer ([place]) 04/16/09   kidney cancer - tx by [facility] per her report released from f/u  . Cataracts, bilateral   . Diverticulitis 11/2004  . Endometriosis 1994  . GERD (gastroesophageal reflux disease)    hx hiatal hernia, hx esophageal stricture s/p dilation  . Headache   . Hepatitis   . History of chicken pox   . History of hiatal hernia   . History of measles   . History of mumps as a child   . Hx of hepatitis    with cirrhosis - per her report from [place] and eval in 2013 at [place]  . Hyperlipidemia   . Left ear pain   . Migraines    pt states vesticular migraines has dizziness in relation   . Numbness and tingling    feet bilat has had for 40 years  . Right knee pain   . Scarlet fever   . Shingles   . Urinary tract bacterial infections    hx of  . Vertigo    associated with headache, intermittent, chronic    Past Surgical History:  Procedure Laterality Date  . ABDOMINAL HYSTERECTOMY  1994   TAH/BSO due to endometriosis  . APPENDECTOMY    . CATARACT EXTRACTION W/ INTRAOCULAR LENS IMPLANT Bilateral 05/2015, 04/2015  . DIAGNOSTIC LAPAROSCOPY    . LAPAROSCOPIC PARTIAL COLECTOMY N/A 04/10/2015   Procedure: LAPAROSCOPIC PARTIAL CECTOMY;  Surgeon:  Ralene Ok, MD;  Location: [facility];  Service: General;  Laterality: N/A;  . LUNG BIOPSY  1996   Negative  . NEPHRECTOMY  04/16/09   Partial removal due to cancer  . OOPHORECTOMY    . TONSILLECTOMY    . TONSILLECTOMY      There were no vitals filed for this visit.  Subjective Assessment - 07/14/17 2030    Subjective  Pt states she saw MD in [facility] last week - says she may have some autoimmune disorder but they don't know yet; wants to continue PT - states "this is helping me alot'    Pertinent History  idiopathic peripheral neuropathy; pt was prescribed medication for migraines but states medication did not help; pt has sensorineural hearing loss (initally labyrinthitis? with sequelae?); h/o kidney cancer; chronic headaches    Patient Stated Goals  "would like to be comfortable enough to not have to use cane and be self sufficient"    Currently in Pain?  No/denies                      [facility] Adult PT Treatment/Exercise - 07/14/17 0001  Ambulation/Gait   Ambulation/Gait  Yes    Ambulation/Gait Assistance  5: Supervision    Ambulation Distance (Feet)  250 Feet    Assistive device  None    Gait Pattern  Within Functional Limits    Ambulation Surface  Level;Indoor    Gait Comments  Pt amb. tossing ball up and catching, making circles clockwise and counterclockwise around track with CGA for safety      Knee/Hip Exercises: Aerobic   Recumbent Bike  SciFit level 2.0 x 5" with UE's and LE's no charge due to unsupervised          Balance Exercises - 07/14/17 2033      Balance Exercises: Standing   Standing Eyes Opened  Wide (BOA);Head turns;Foam/compliant surface;5 reps    Standing Eyes Closed  Wide (BOA);Head turns;Foam/compliant surface;5 reps    Partial Tandem Stance  Eyes open;Foam/compliant surface;2 reps;15 secs added side to side and up/down head turns    Other Standing Exercises  balance exercises performed in corner with EO and eC - touching each  wall and diagonals 5 reps each        Pt performed standing balance activity for incr. Vestibular input - standing on blue mat on incline and on decline - with EO and EC - marching in place, alternate stepping up/back and down/back with horizontal and vertical head turns -  With EO and EC        PT Long Term Goals - 07/13/17 1413      PT LONG TERM GOAL #6   Title  Pt will report at least 50% improvement in vertigo.  (Pended)     Status  Achieved  (Pended)             Plan - 07/14/17 2036    Clinical Impression Statement  Pt continues to progress with balance and gait; pt had more unsteadiness with horizontal head turns than with vertical head turns during today's session    Rehab Potential  Good    PT Frequency  2x / week    PT Duration  4 weeks    PT Treatment/Interventions  ADLs/Self Care Home Management;Gait training;Stair training;Therapeutic activities;Therapeutic exercise;Balance training;Neuromuscular re-education;Patient/family education;Vestibular    PT Next Visit Plan   cont balance and gait    PT Home Exercise Plan  x1 viewing; balance on foam added 05-19-17; added heel lifts and heel cord stretch on 06-04-17    Consulted and Agree with Plan of Care  Patient       Patient will benefit from skilled therapeutic intervention in order to improve the following deficits and impairments:  Abnormal gait, Decreased balance, Difficulty walking, Dizziness  Visit Diagnosis: Other abnormalities of gait and mobility  Unsteadiness on feet     Problem List Patient Active Problem List   Diagnosis Date Noted  . S/P partial resection of colon 04/10/2015  . Cirrhosis ([place]) 12/08/2014  . Esophageal reflux 12/08/2014  . Esophageal stricture 12/08/2014  . Vertigo 12/08/2014    Alda Lea, PT 07/14/2017, 8:44 PM  [facility] [address] [place], [place], [address] Phone: [phone]   Fax:   [phone]  Name: MALGORZATA ALBERT MRN: [id number] Date of Birth: May 13, 1946

## 2017-07-27 ENCOUNTER — Ambulatory Visit: Payer: Medicare Other | Attending: Family Medicine | Admitting: Physical Therapy

## 2017-07-27 ENCOUNTER — Encounter: Payer: Self-pay | Admitting: Physical Therapy

## 2017-07-27 DIAGNOSIS — R2681 Unsteadiness on feet: Secondary | ICD-10-CM | POA: Insufficient documentation

## 2017-07-27 DIAGNOSIS — R42 Dizziness and giddiness: Secondary | ICD-10-CM | POA: Diagnosis not present

## 2017-07-27 NOTE — Therapy (Signed)
[facility] [address] [place], [place], [address] Phone: [phone]   Fax:  [phone]  Physical Therapy Treatment  Patient Details  Name: Brenda [name] MRN: [id number] Date of Birth: 1946/07/20 Referring Provider: Colin Benton, DO   Encounter Date: 07/27/2017  PT End of Session - 07/27/17 1003    Visit Number  46 [place]    Number of Visits  15    Date for PT Re-Evaluation  08/03/17    Authorization Type  MCR    Authorization Time Period  06-04-17 - 08-03-17    PT Start Time  0846    PT Stop Time  0931    PT Time Calculation (min)  45 min    Equipment Utilized During Treatment  Gait belt       Past Medical History:  Diagnosis Date  . Arthritis   . Bronchitis    hx of when smoked   . Cancer ([place]) 04/16/09   kidney cancer - [facility] per her report released from f/u  . Cataracts, bilateral   . Diverticulitis 11/2004  . Endometriosis 1994  . GERD (gastroesophageal reflux disease)    hx hiatal hernia, hx esophageal stricture s/p dilation  . Headache   . Hepatitis   . History of chicken pox   . History of hiatal hernia   . History of measles   . History of mumps as a child   . Hx of hepatitis    with cirrhosis - per her report from [place] and eval in 2013 at [facility]  . Hyperlipidemia   . Left ear pain   . Migraines    pt states vesticular migraines has dizziness in relation   . Numbness and tingling    feet bilat has had for 40 years  . Right knee pain   . Scarlet fever   . Shingles   . Urinary tract bacterial infections    hx of  . Vertigo    associated with headache, intermittent, chronic    Past Surgical History:  Procedure Laterality Date  . ABDOMINAL HYSTERECTOMY  1994   TAH/BSO due to endometriosis  . APPENDECTOMY    . CATARACT EXTRACTION W/ INTRAOCULAR LENS IMPLANT Bilateral 05/2015, 04/2015  . DIAGNOSTIC LAPAROSCOPY    . LAPAROSCOPIC PARTIAL COLECTOMY N/A 04/10/2015   Procedure: LAPAROSCOPIC PARTIAL CECTOMY;  Surgeon: Ralene Ok, MD;  Location: [facility];  Service: General;  Laterality: N/A;  . LUNG BIOPSY  1996   Negative  . NEPHRECTOMY  04/16/09   Partial removal due to cancer  . OOPHORECTOMY    . TONSILLECTOMY    . TONSILLECTOMY      There were no vitals filed for this visit.  Subjective Assessment - 07/27/17 1000    Subjective  Pt states she went to church yesterday for first time in 1 1/2 years - stated her body started swaying when she was standing while singing hym wiht congregation - and reports she had some mild dizziness - nothing to hold onto or lean against to help stabilize so she had to sit down     Pertinent History  idiopathic peripheral neuropathy; pt was prescribed medication for migraines but states medication did not help; pt has sensorineural hearing loss (initally labyrinthitis? with sequelae?); h/o kidney cancer; chronic headaches    Patient Stated Goals  "would like to be comfortable enough to not have to use cane and be self sufficient"    Currently in Pain?  No/denies  [facility] Adult PT Treatment/Exercise - 07/27/17 0907      Ambulation/Gait   Ambulation/Gait  Yes    Ambulation/Gait Assistance  5: Supervision    Ambulation Distance (Feet)  230 Feet    Assistive device  None    Gait Pattern  Within Functional Limits    Ambulation Surface  Level;Indoor    Gait Comments  Pt amb. tossing ball up and catching, making circles clockwise and counterclockwise around track with CGA for safety      High Level Balance   High Level Balance Activities  Turns;Head turns;Other (comment) crossovers with 180 degree turn on mat          Balance Exercises - 07/27/17 1002      Balance Exercises: Standing   Standing Eyes Opened  Wide (BOA);Head turns;Foam/compliant surface;5 reps    Standing Eyes Closed  Wide (BOA);Head turns;Foam/compliant surface;5 reps    Other Standing Exercises  standing on foam  with EO and EC with feet apart and feet together; with head turns horizontal and vertical            Pt performed slow forward, back and side kicks standing on blue mat with horizontal head turns with CGA to SBA Marching in place on mat with head turns - horizontal and vertical with CGA to min assist for recovery of LOB   Pt performed marching on blue mat on incline and then on decline - with EO and EC;  Added horizontal and vertical Head turns with EO and EC with CGA to min assist Pt amb. 2 reps on mat on incline/decline with EC for incr. vestibular input with CGA    PT Long Term Goals - 07/27/17 1007      PT LONG TERM GOAL #1   Title  Improve TUG score to </= 13.0 secs with cane for reduced fall risk.    Status  Achieved      PT LONG TERM GOAL #2   Title  Improve DVA to </= 2 line difference for improved gaze stabilization.    Baseline  2 line difference with c/o dizziness upon completion of testing - rates dizziness 3/10 -- 07-02-17    Status  Partially Met      PT LONG TERM GOAL #3   Title  Increase gait velocity from 2.52 to >/= 3.1 ft/sec with cane for incr. gait efficiency.    Status  Achieved      PT LONG TERM GOAL #4   Title  Pt will report at least 25% improvement in vertigo.    Baseline  45% improvement reported on 06-04-17    Status  Achieved      PT LONG TERM GOAL #5   Title  Independent in HEP for balance and vestibular exercises.    Status  Achieved      PT LONG TERM GOAL #6   Title  Pt will report at least 50% improvement in vertigo.    Status  New      PT LONG TERM GOAL #7   Title  Pt will amb. 32' with horizontal head turns without moderate LOB with dizziness rating </= 3/10 intensity.    Status  On-going      PT LONG TERM GOAL #8   Title  Amb. 300' modified independently without cane on flat,even surface without LOB.    Status  On-going            Plan - 07/27/17 1004    Clinical Impression Statement  Pt  had more LOB with dynamic standing  balance activities than c/o vertigo - and had increased postural sway toward Rt side; pt attributes unsteadiness to not having attended PT in approx. 2 weeks due to scheduling issues     Rehab Potential  Good    PT Frequency  2x / week    PT Duration  4 weeks    PT Treatment/Interventions  ADLs/Self Care Home Management;Gait training;Stair training;Therapeutic activities;Therapeutic exercise;Balance training;Neuromuscular re-education;Patient/family education;Vestibular    PT Next Visit Plan  check LTG's and renew for 4 more weeks    PT Home Exercise Plan  x1 viewing; balance on foam added 05-19-17; added heel lifts and heel cord stretch on 06-04-17    Consulted and Agree with Plan of Care  Patient       Patient will benefit from skilled therapeutic intervention in order to improve the following deficits and impairments:  Abnormal gait, Decreased balance, Difficulty walking, Dizziness  Visit Diagnosis: Unsteadiness on feet  Dizziness and giddiness     Problem List Patient Active Problem List   Diagnosis Date Noted  . S/P partial resection of colon 04/10/2015  . Cirrhosis ([place]) 12/08/2014  . Esophageal reflux 12/08/2014  . Esophageal stricture 12/08/2014  . Vertigo 12/08/2014    Alda Lea, PT 07/27/2017, 10:09 AM  [facility] [address] [place], [place], [address] Phone: [phone]   Fax:  [phone]  Name: Brenda [name] MRN: [id number] Date of Birth: [birth year]

## 2017-07-31 ENCOUNTER — Telehealth: Payer: Self-pay | Admitting: Family Medicine

## 2017-07-31 NOTE — Telephone Encounter (Signed)
Copied from [facility]. Topic: General - Other >> Jul 31, 2017 10:34 AM Carolyn Stare wrote:   Pt call to say the following med gives her heartburn and she is taking med to help with the heartburn but it is not helping. She also said the medicine gives her leg cramps at night and is asking that this med be changed  rosuvastatin (CRESTOR) 10 MG tablet

## 2017-08-03 ENCOUNTER — Ambulatory Visit: Payer: Medicare Other | Admitting: Physical Therapy

## 2017-08-04 ENCOUNTER — Encounter: Payer: Medicare Other | Admitting: Physical Therapy

## 2017-08-05 NOTE — Telephone Encounter (Signed)
Could try pravastatin 20mg . Please rx #[id number], 3 rf. Please remove crestor from list.

## 2017-08-06 MED ORDER — PRAVASTATIN SODIUM 20 MG PO TABS: 20.0000 mg | ORAL_TABLET | Freq: Every day | ORAL | 3 refills | Status: DC

## 2017-08-06 NOTE — Telephone Encounter (Signed)
I called the pt and she is aware the Rx was sent to her pharmacy.  Crestor removed from the medication list.

## 2017-08-10 ENCOUNTER — Ambulatory Visit: Payer: Medicare Other | Admitting: Physical Therapy

## 2017-08-10 DIAGNOSIS — R2681 Unsteadiness on feet: Secondary | ICD-10-CM | POA: Diagnosis not present

## 2017-08-10 DIAGNOSIS — R42 Dizziness and giddiness: Secondary | ICD-10-CM

## 2017-08-11 ENCOUNTER — Encounter: Payer: Self-pay | Admitting: Physical Therapy

## 2017-08-11 NOTE — Therapy (Signed)
[facility] [address] [place], [place], [address] Phone: [phone]   Fax:  [phone]  Physical Therapy Treatment  Patient Details  Name: Brenda [name] MRN: [id number] Date of Birth: 18-Mar-1946 Referring Provider: Colin Benton, DO   Encounter Date: 08/10/2017  PT End of Session - 08/11/17 2017    Visit Number  15 G8    Number of Visits  18    Date for PT Re-Evaluation  09/03/17    Authorization Type  MCR    Authorization Time Period  06-04-17 - 08-03-17;  08-10-17 - 09-11-16    PT Start Time  7616    PT Stop Time  1447    PT Time Calculation (min)  44 min    Equipment Utilized During Treatment  Gait belt       Past Medical History:  Diagnosis Date  . Arthritis   . Bronchitis    hx of when smoked   . Cancer ([place]) 04/16/09   kidney cancer - tx by [facility] per her report released from f/u  . Cataracts, bilateral   . Diverticulitis 11/2004  . Endometriosis 1994  . GERD (gastroesophageal reflux disease)    hx hiatal hernia, hx esophageal stricture s/p dilation  . Headache   . Hepatitis   . History of chicken pox   . History of hiatal hernia   . History of measles   . History of mumps as a child   . Hx of hepatitis    with cirrhosis - per her report from [facility] and eval in 2013 at [facility]  . Hyperlipidemia   . Left ear pain   . Migraines    pt states vesticular migraines has dizziness in relation   . Numbness and tingling    feet bilat has had for 40 years  . Right knee pain   . Scarlet fever   . Shingles   . Urinary tract bacterial infections    hx of  . Vertigo    associated with headache, intermittent, chronic    Past Surgical History:  Procedure Laterality Date  . ABDOMINAL HYSTERECTOMY  1994   TAH/BSO due to endometriosis  . APPENDECTOMY    . CATARACT EXTRACTION W/ INTRAOCULAR LENS IMPLANT Bilateral 05/2015, 04/2015  . DIAGNOSTIC LAPAROSCOPY    . LAPAROSCOPIC PARTIAL COLECTOMY  N/A 04/10/2015   Procedure: LAPAROSCOPIC PARTIAL CECTOMY;  Surgeon: Ralene Ok, MD;  Location: [facility];  Service: General;  Laterality: N/A;  . LUNG BIOPSY  1996   Negative  . NEPHRECTOMY  04/16/09   Partial removal due to cancer  . OOPHORECTOMY    . TONSILLECTOMY    . TONSILLECTOMY      There were no vitals filed for this visit.  Subjective Assessment - 08/11/17 2014    Subjective  Pt states she has a harder time just standing still than she does moving or walking    Pertinent History  idiopathic peripheral neuropathy; pt was prescribed medication for migraines but states medication did not help; pt has sensorineural hearing loss (initally labyrinthitis? with sequelae?); h/o kidney cancer; chronic headaches    Patient Stated Goals  "would like to be comfortable enough to not have to use cane and be self sufficient"          Balance Exercises -   High Level Balance Activities  Turns;Head turns;Other (comment) crossovers with 180 degree turn on mat          Balance Exercises: Standing  Standing Eyes Opened  Wide (BOA);Head turns;Foam/compliant surface;5 reps    Standing Eyes Closed  Wide (BOA);Head turns;Foam/compliant surface;5 reps    Partial Tandem Stance  Eyes open;Foam/compliant surface;2 reps;15 secs added side to side and up/down head turns    Other Standing Exercises  balance exercises performed in corner with EO and eC - touching each wall and diagonals 5 reps each     Amb. Making circles clockwise and counterclockwise circles with ball 35' x 4 reps                         PT Long Term Goals - 08/11/17 2023      PT LONG TERM GOAL #1   Title  Improve TUG score to </= 13.0 secs with cane for reduced fall risk.    Status  Achieved      PT LONG TERM GOAL #2   Title  Improve DVA to </= 2 line difference for improved gaze stabilization.    Baseline  2 line difference with c/o dizziness upon completion of testing - rates dizziness 3/10 -- 07-02-17     Status  Partially Met      PT LONG TERM GOAL #3   Title  Increase gait velocity from 2.52 to >/= 3.1 ft/sec with cane for incr. gait efficiency.    Baseline  10.19 secs = 3.22 ft/sec    Status  Achieved      PT LONG TERM GOAL #4   Title  Pt will report at least 25% improvement in vertigo.    Baseline  45% improvement reported on 06-04-17    Status  Achieved      PT LONG TERM GOAL #5   Title  Independent in HEP for balance and vestibular exercises.    Status  Achieved      PT LONG TERM GOAL #6   Title  Pt will report at least 50% improvement in vertigo.    Status  New      PT LONG TERM GOAL #7   Title  Pt will amb. 66' with horizontal head turns without moderate LOB with dizziness rating </= 3/10 intensity.    Status  On-going      PT LONG TERM GOAL #8   Title  Amb. 300' modified independently without cane on flat,even surface without LOB.    Status  On-going            Plan - 08/11/17 2021    Clinical Impression Statement  Pt cont to have unsteadiness with balance activities on compliant surfaces with EC - indicative of decr. vestibular function    Rehab Potential  Good    PT Frequency  2x / week    PT Duration  4 weeks    PT Treatment/Interventions  ADLs/Self Care Home Management;Gait training;Stair training;Therapeutic activities;Therapeutic exercise;Balance training;Neuromuscular re-education;Patient/family education;Vestibular    PT Next Visit Plan  renewal completed - D/C next session    PT Home Exercise Plan  x1 viewing; balance on foam added 05-19-17; added heel lifts and heel cord stretch on 06-04-17    Consulted and Agree with Plan of Care  Patient       Patient will benefit from skilled therapeutic intervention in order to improve the following deficits and impairments:  Abnormal gait, Decreased balance, Difficulty walking, Dizziness  Visit Diagnosis: Unsteadiness on feet - Plan: PT plan of care cert/re-cert  Dizziness and giddiness - Plan: PT plan of  care cert/re-cert  Problem List Patient Active Problem List   Diagnosis Date Noted  . S/P partial resection of colon 04/10/2015  . Cirrhosis ([place]) 12/08/2014  . Esophageal reflux 12/08/2014  . Esophageal stricture 12/08/2014  . Vertigo 12/08/2014    Alda Lea, PT 08/11/2017, 8:29 PM  [facility] [address] [place], [place], [address] Phone: [phone]   Fax:  [phone]  Name: Brenda [name] MRN: [id number] Date of Birth: 01/10/1946

## 2017-08-13 ENCOUNTER — Encounter: Payer: Self-pay | Admitting: Physical Therapy

## 2017-08-13 ENCOUNTER — Ambulatory Visit: Payer: Medicare Other | Admitting: Physical Therapy

## 2017-08-13 DIAGNOSIS — R42 Dizziness and giddiness: Secondary | ICD-10-CM | POA: Diagnosis not present

## 2017-08-13 DIAGNOSIS — R2681 Unsteadiness on feet: Secondary | ICD-10-CM | POA: Diagnosis not present

## 2017-08-13 NOTE — Therapy (Signed)
[facility] [address] [place], [place], [address] Phone: [phone]   Fax:  [phone]  Physical Therapy Treatment  Patient Details  Name: Brenda [name] MRN: [id number] Date of Birth: 1946/02/11 Referring Provider: Colin Benton, DO   Encounter Date: 08/13/2017  PT End of Session - 08/13/17 1935    Visit Number  16 G9    Number of Visits  22    Date for PT Re-Evaluation  09/12/17    Authorization Type  MCR    Authorization Time Period  06-04-17 - 08-03-17;  08-10-17 - 09-11-16    PT Start Time  1018    PT Stop Time  1103    PT Time Calculation (min)  45 min       Past Medical History:  Diagnosis Date  . Arthritis   . Bronchitis    hx of when smoked   . Cancer ([place]) 04/16/09   kidney cancer - tx by [facility] per her report released from f/u  . Cataracts, bilateral   . Diverticulitis 11/2004  . Endometriosis 1994  . GERD (gastroesophageal reflux disease)    hx hiatal hernia, hx esophageal stricture s/p dilation  . Headache   . Hepatitis   . History of chicken pox   . History of hiatal hernia   . History of measles   . History of mumps as a child   . Hx of hepatitis    with cirrhosis - per her report from [facility] and eval in 2013 at [facility]  . Hyperlipidemia   . Left ear pain   . Migraines    pt states vesticular migraines has dizziness in relation   . Numbness and tingling    feet bilat has had for 40 years  . Right knee pain   . Scarlet fever   . Shingles   . Urinary tract bacterial infections    hx of  . Vertigo    associated with headache, intermittent, chronic    Past Surgical History:  Procedure Laterality Date  . ABDOMINAL HYSTERECTOMY  1994   TAH/BSO due to endometriosis  . APPENDECTOMY    . CATARACT EXTRACTION W/ INTRAOCULAR LENS IMPLANT Bilateral 05/2015, 04/2015  . DIAGNOSTIC LAPAROSCOPY    . LAPAROSCOPIC PARTIAL COLECTOMY N/A 04/10/2015   Procedure: LAPAROSCOPIC PARTIAL  CECTOMY;  Surgeon: Ralene Ok, MD;  Location: [facility];  Service: General;  Laterality: N/A;  . LUNG BIOPSY  1996   Negative  . NEPHRECTOMY  04/16/09   Partial removal due to cancer  . OOPHORECTOMY    . TONSILLECTOMY    . TONSILLECTOMY      There were no vitals filed for this visit.  Subjective Assessment - 08/13/17 1929    Subjective  Pt states she sees yellow at times - also sees occasional objects/movement out of corner of her eyes - intermittent in occurrence; does not know why this occurs    Pertinent History  idiopathic peripheral neuropathy; pt was prescribed medication for migraines but states medication did not help; pt has sensorineural hearing loss (initally labyrinthitis? with sequelae?); h/o kidney cancer; chronic headaches    Patient Stated Goals  "would like to be comfortable enough to not have to use cane and be self sufficient"              LLE SLS = 4.75 secs:  RLE = >10 secs  DVA - 2 line difference with c/o vertigo 2/10 intensity when testing completed  DHI 72%  [facility] Adult PT Treatment/Exercise - 08/13/17 0001      Ambulation/Gait   Ambulation/Gait  Yes    Ambulation/Gait Assistance  5: Supervision    Ambulation Distance (Feet)  200 Feet    Assistive device  None    Gait Pattern  Within Functional Limits    Ambulation Surface  Level;Indoor with horizontal and vertical head turns      Self-Care   Self-Care  Other Self-Care Comments    Other Self-Care Comments   Pt completed [place] - score 72%           Balance Exercises - 08/13/17 1934      Balance Exercises: Standing   Standing Eyes Opened  Wide ([place]);Head turns;Foam/compliant surface;5 reps    Standing Eyes Closed  Wide (BOA);Head turns;Foam/compliant surface;5 reps    Other Standing Exercises  balance exercises performed in corner with EO and eC - touching each wall and diagonals 5 reps each              PT Long Term Goals - 08/13/17 1031      PT LONG TERM GOAL #1    Title  Improve TUG score to </= 13.0 secs with cane for reduced fall risk.    Baseline  12.44 secs;  12.22      PT LONG TERM GOAL #2   Title  Improve DVA to </= 2 line difference for improved gaze stabilization.    Baseline  2 line difference with c/o dizziness upon completion of testing - rates dizziness 3/10 -- 07-02-17; rates dizziness 2/10 after completion of test    Status  On-going    Target Date  09/07/17      PT LONG TERM GOAL #3   Title  Increase gait velocity from 2.52 to >/= 3.1 ft/sec with cane for incr. gait efficiency.    Baseline  10.19 secs = 3.22 ft/sec;  11.38 secs     Time  4    Period  Weeks    Status  Achieved      PT LONG TERM GOAL #4   Title  Pt will report at least 25% improvement in vertigo.    Baseline  45% improvement reported on 06-04-17    Status  Achieved      PT LONG TERM GOAL #5   Title  Independent in HEP for balance and vestibular exercises.    Status  Achieved      Additional Long Term Goals   Additional Long Term Goals  Yes      PT LONG TERM GOAL #6   Title  Pt will report at least 50% improvement in vertigo.    Baseline  pt reports status varies depending on intensity of vertigo/balance - 08-13-17    Status  Partially Met    Target Date  09/07/17      PT LONG TERM GOAL #7   Title  Pt will amb. 46' with horizontal head turns without moderate LOB with dizziness rating </= 3/10 intensity.    Baseline  dizziness 5/10 intensity  08-13-17    Target Date  09/07/17      PT LONG TERM GOAL #8   Title  Amb. 300' modified independently without cane on flat,even surface without LOB.    Status  Achieved      PT LONG TERM GOAL  #9   TITLE  Pt will improve DHI score from 72% to </= 48% to demo improvement in vertigo.    Baseline  72% 08-13-17  Time  4    Period  Weeks    Status  New    Target Date  09/07/17      PT LONG TERM GOAL  #10   TITLE  Improve Lt SLS to >/= 6 secs     Baseline  4.75 secs on LLE    Time  4    Period  Weeks    Status   New    Target Date  09/07/17            Plan - 08/13/17 1942    Clinical Impression Statement  Pt progressing slowly but steadily towards goals - continues to c/o vertigo which is worse with activites with EC, but states overall it is improving; pt's score on DHI is 72%, but functional level does not reflect this severity level of impairment                                                                                           Rehab Potential  Good    PT Frequency  2x / week    PT Duration  4 weeks    PT Treatment/Interventions  ADLs/Self Care Home Management;Gait training;Stair training;Therapeutic activities;Therapeutic exercise;Balance training;Neuromuscular re-education;Patient/family education;Vestibular    PT Next Visit Plan  continue with balance/vestibular activities    PT Home Exercise Plan  x1 viewing; balance on foam added 05-19-17; added heel lifts and heel cord stretch on 06-04-17    Consulted and Agree with Plan of Care  Patient       Patient will benefit from skilled therapeutic intervention in order to improve the following deficits and impairments:  Abnormal gait, Decreased balance, Difficulty walking, Dizziness  Visit Diagnosis: Unsteadiness on feet  Dizziness and giddiness     Problem List Patient Active Problem List   Diagnosis Date Noted  . S/P partial resection of colon 04/10/2015  . Cirrhosis ([place]) 12/08/2014  . Esophageal reflux 12/08/2014  . Esophageal stricture 12/08/2014  . Vertigo 12/08/2014    Alda Lea, PT 08/13/2017, 7:49 PM  [facility] [address] [place], [place], [address] Phone: [phone]   Fax:  [phone]  Name: Brenda [name] MRN: [id number] Date of Birth: [birth year]

## 2017-08-20 ENCOUNTER — Ambulatory Visit: Payer: Medicare Other | Admitting: Physical Therapy

## 2017-08-20 ENCOUNTER — Encounter: Payer: Self-pay | Admitting: Physical Therapy

## 2017-08-20 DIAGNOSIS — R2681 Unsteadiness on feet: Secondary | ICD-10-CM | POA: Diagnosis not present

## 2017-08-20 DIAGNOSIS — R42 Dizziness and giddiness: Secondary | ICD-10-CM | POA: Diagnosis not present

## 2017-08-20 NOTE — Therapy (Signed)
[facility] [address] [place], [place], [address] Phone: [phone]   Fax:  [phone]  Physical Therapy Treatment  Patient Details  Name: SAMAN GIDDENS MRN: [id number] Date of Birth: November 21, 1945 Referring Provider: Colin Benton, DO   Encounter Date: 08/20/2017  PT End of Session - 08/20/17 1201    Visit Number  17    Number of Visits  22    Date for PT Re-Evaluation  09/12/17    Authorization Type  MCR    Authorization Time Period  06-04-17 - 08-03-17;  08-10-17 - 09-11-16    PT Start Time  0934    PT Stop Time  1016    PT Time Calculation (min)  42 min    Equipment Utilized During Treatment  Gait belt       Past Medical History:  Diagnosis Date  . Arthritis   . Bronchitis    hx of when smoked   . Cancer ([place]) 04/16/09   kidney cancer - [facility] per her report released from f/u  . Cataracts, bilateral   . Diverticulitis 11/2004  . Endometriosis 1994  . GERD (gastroesophageal reflux disease)    hx hiatal hernia, hx esophageal stricture s/p dilation  . Headache   . Hepatitis   . History of chicken pox   . History of hiatal hernia   . History of measles   . History of mumps as a child   . Hx of hepatitis    with cirrhosis - per her report from [facility] and eval in 2013 at [facility]  . Hyperlipidemia   . Left ear pain   . Migraines    pt states vesticular migraines has dizziness in relation   . Numbness and tingling    feet bilat has had for 40 years  . Right knee pain   . Scarlet fever   . Shingles   . Urinary tract bacterial infections    hx of  . Vertigo    associated with headache, intermittent, chronic    Past Surgical History:  Procedure Laterality Date  . ABDOMINAL HYSTERECTOMY  1994   TAH/BSO due to endometriosis  . APPENDECTOMY    . CATARACT EXTRACTION W/ INTRAOCULAR LENS IMPLANT Bilateral 05/2015, 04/2015  . DIAGNOSTIC LAPAROSCOPY    . LAPAROSCOPIC PARTIAL COLECTOMY  N/A 04/10/2015   Procedure: LAPAROSCOPIC PARTIAL CECTOMY;  Surgeon: Ralene Ok, MD;  Location: [facility];  Service: General;  Laterality: N/A;  . LUNG BIOPSY  1996   Negative  . NEPHRECTOMY  04/16/09   Partial removal due to cancer  . OOPHORECTOMY    . TONSILLECTOMY    . TONSILLECTOMY      There were no vitals filed for this visit.  Subjective Assessment - 08/20/17 1202    Subjective  Pt states she fell into wall over the weekend and caught herself with her elbow (right); states she stumbled right much this week but attributes it to being tired and trying to do too much at one time    Pertinent History  idiopathic peripheral neuropathy; pt was prescribed medication for migraines but states medication did not help; pt has sensorineural hearing loss (initally labyrinthitis? with sequelae?); h/o kidney cancer; chronic headaches    Patient Stated Goals  "would like to be comfortable enough to not have to use cane and be self sufficient"    Currently in Pain?  No/denies Rt elbow is sore from hitting the wall but no true pain  [facility] Adult PT Treatment/Exercise - 08/20/17 1203      Ambulation/Gait   Ambulation/Gait  Yes    Ambulation/Gait Assistance  5: Supervision    Ambulation/Gait Assistance Details  cues to increase speed    Ambulation Distance (Feet)  450 Feet    Assistive device  None    Gait Pattern  Within Functional Limits    Ambulation Surface  Level;Indoor    Gait Comments  Pt performed horizontal and vertical head turns during gait       High Level Balance   High Level Balance Activities  Backward walking;Turns;Sudden stops;Marching forwards crossovers front and back on blue mat - with 180 degree turs          Balance Exercises - 08/20/17 1206      Balance Exercises: Standing   Standing Eyes Opened  Wide (BOA);Head turns;Foam/compliant surface;5 reps    Standing Eyes Closed  Wide (BOA);Head turns;Foam/compliant surface;5 reps     Partial Tandem Stance  Eyes open;Foam/compliant surface;2 reps;15 secs added side to side and up/down head turns    Other Standing Exercises  balance exercises performed in corner - touching across, diagonals with EO and EC 5 reps each:               PT Long Term Goals - 08/20/17 1220      PT LONG TERM GOAL #1   Title  Improve TUG score to </= 13.0 secs with cane for reduced fall risk.    Baseline  12.44 secs;  12.22    Status  Achieved      PT LONG TERM GOAL #2   Title  Improve DVA to </= 2 line difference for improved gaze stabilization.    Baseline  2 line difference with c/o dizziness upon completion of testing - rates dizziness 3/10 -- 07-02-17; rates dizziness 2/10 after completion of test    Status  On-going      PT LONG TERM GOAL #3   Title  Increase gait velocity from 2.52 to >/= 3.1 ft/sec with cane for incr. gait efficiency.    Baseline  10.19 secs = 3.22 ft/sec;  11.38 secs     Status  Achieved      PT LONG TERM GOAL #4   Title  Pt will report at least 25% improvement in vertigo.    Baseline  45% improvement reported on 06-04-17    Status  Achieved      PT LONG TERM GOAL #5   Title  Independent in HEP for balance and vestibular exercises.    Status  Achieved      PT LONG TERM GOAL #6   Title  Pt will report at least 50% improvement in vertigo.    Baseline  pt reports status varies depending on intensity of vertigo/balance - 08-13-17    Status  Partially Met      PT LONG TERM GOAL #7   Title  Pt will amb. 18' with horizontal head turns without moderate LOB with dizziness rating </= 3/10 intensity.    Baseline  dizziness 5/10 intensity  08-13-17    Status  On-going      PT LONG TERM GOAL #8   Title  Amb. 300' modified independently without cane on flat,even surface without LOB.    Status  Achieved      PT LONG TERM GOAL  #9   TITLE  Pt will improve DHI score from 72% to </= 48% to demo improvement in vertigo.  Status  New      PT LONG TERM GOAL  #10    TITLE  Improve Lt SLS to >/= 6 secs     Baseline  4.75 secs on LLE    Status  New            Plan - 2017/08/24 1216    Clinical Impression Statement  Pt somewhat more unsteady today with vestibular/gait activities which she attributes to fatigue; pt rated vertigo 2/10 intensity at start of session and states intensity increased to 5/10 at end of session; pt able to recover LOB with activities     Rehab Potential  Good    PT Frequency  2x / week    PT Duration  4 weeks    PT Treatment/Interventions  ADLs/Self Care Home Management;Gait training;Stair training;Therapeutic activities;Therapeutic exercise;Balance training;Neuromuscular re-education;Patient/family education;Vestibular    PT Next Visit Plan  continue with balance/vestibular activities    PT Home Exercise Plan  x1 viewing; balance on foam added 05-19-17; added heel lifts and heel cord stretch on 06-04-17    Consulted and Agree with Plan of Care  Patient       Patient will benefit from skilled therapeutic intervention in order to improve the following deficits and impairments:  Abnormal gait, Decreased balance, Difficulty walking, Dizziness  Visit Diagnosis: Dizziness and giddiness  Unsteadiness on feet   G-Codes - 2017-08-24 1222    Functional Assessment Tool Used (Outpatient Only)  TUG score 12.44 secs without cane:  gait velocity 3.22 ft/sec; DVA 2 line difference; DHI 72%    Functional Limitation  Mobility: Walking and moving around    Mobility: Walking and Moving Around Current Status [phone])  At least 20 percent but less than 40 percent impaired, limited or restricted    Mobility: Walking and Moving Around Goal Status [phone])  At least 1 percent but less than 20 percent impaired, limited or restricted       Problem List Patient Active Problem List   Diagnosis Date Noted  . S/P partial resection of colon 04/10/2015  . Cirrhosis ([facility]) 12/08/2014  . Esophageal reflux 12/08/2014  . Esophageal stricture 12/08/2014   . Vertigo 12/08/2014    Physical Therapy Progress Note  Dates of Reporting Period: 06-09-17  to 2017-08-24  Objective Reports of Subjective Statement: See above for progress towards goals; Pt is now ambulating into clinic without use of SPC:  Pt reports at least 45% improvement in vertigo  Objective Measurements: TUG score 12.22 secs:  DHI 72%:  Gait velocity 3.22 ft/sec  Goal Update: see above  Plan: see above for treatment plan and interventions  Reason Skilled Services are Required: Cont. Decreased high level balance skills; cont. Unsteadiness with high level gait and cont c/o dizziness with activities requiring increased vestibular input   Jaleena Viviani, [place], PT Aug 24, 2017, 12:25 PM  [facility] [address] [place], [place], [address] Phone: [phone]   Fax:  [phone]  Name: MIKA GRIFFITTS MRN: [id number] Date of Birth: 1946-07-03

## 2017-09-03 ENCOUNTER — Encounter: Payer: Self-pay | Admitting: Family Medicine

## 2017-09-03 DIAGNOSIS — R768 Other specified abnormal immunological findings in serum: Secondary | ICD-10-CM | POA: Diagnosis not present

## 2017-09-03 DIAGNOSIS — G629 Polyneuropathy, unspecified: Secondary | ICD-10-CM | POA: Diagnosis not present

## 2017-09-07 ENCOUNTER — Ambulatory Visit: Payer: Medicare Other | Attending: Family Medicine | Admitting: Physical Therapy

## 2017-09-07 DIAGNOSIS — R2689 Other abnormalities of gait and mobility: Secondary | ICD-10-CM | POA: Insufficient documentation

## 2017-09-07 DIAGNOSIS — R2681 Unsteadiness on feet: Secondary | ICD-10-CM | POA: Diagnosis not present

## 2017-09-07 DIAGNOSIS — R42 Dizziness and giddiness: Secondary | ICD-10-CM

## 2017-09-08 ENCOUNTER — Encounter: Payer: Self-pay | Admitting: Physical Therapy

## 2017-09-08 NOTE — Therapy (Signed)
[facility] [address] [place], [place], [address] Phone: [phone]   Fax:  [phone]  Physical Therapy Treatment  Patient Details  Name: Brenda [name] MRN: [id number] Date of Birth: Jan 01, 1946 Referring Provider: Colin Benton, DO   Encounter Date: 09/07/2017  PT End of Session - 09/08/17 1918    Visit Number  18    Number of Visits  22    Date for PT Re-Evaluation  09/12/17    Authorization Type  MCR    Authorization Time Period  06-04-17 - 08-03-17;  08-10-17 - 09-11-16    PT Start Time  1320    PT Stop Time  1402    PT Time Calculation (min)  42 min       Past Medical History:  Diagnosis Date  . Arthritis   . Bronchitis    hx of when smoked   . Cancer ([place]) 04/16/09   kidney cancer - [facility] by [facility] per her report released from f/u  . Cataracts, bilateral   . Diverticulitis 11/2004  . Endometriosis 1994  . GERD (gastroesophageal reflux disease)    hx hiatal hernia, hx esophageal stricture s/p dilation  . Headache   . Hepatitis   . History of chicken pox   . History of hiatal hernia   . History of measles   . History of mumps as a child   . Hx of hepatitis    with cirrhosis - per her report from [facility] and eval in 2013 at [facility]  . Hyperlipidemia   . Left ear pain   . Migraines    pt states vesticular migraines has dizziness in relation   . Numbness and tingling    feet bilat has had for 40 years  . Right knee pain   . Scarlet fever   . Shingles   . Urinary tract bacterial infections    hx of  . Vertigo    associated with headache, intermittent, chronic    Past Surgical History:  Procedure Laterality Date  . ABDOMINAL HYSTERECTOMY  1994   TAH/BSO due to endometriosis  . APPENDECTOMY    . CATARACT EXTRACTION W/ INTRAOCULAR LENS IMPLANT Bilateral 05/2015, 04/2015  . DIAGNOSTIC LAPAROSCOPY    . LAPAROSCOPIC PARTIAL COLECTOMY N/A 04/10/2015   Procedure: LAPAROSCOPIC PARTIAL  CECTOMY;  Surgeon: Ralene Ok, MD;  Location: [facility];  Service: General;  Laterality: N/A;  . LUNG BIOPSY  1996   Negative  . NEPHRECTOMY  04/16/09   Partial removal due to cancer  . OOPHORECTOMY    . TONSILLECTOMY    . TONSILLECTOMY      There were no vitals filed for this visit.  Subjective Assessment - 09/08/17 1912    Subjective  Pt states she pulled a muscle in her back over Christmas, states it got better but then got worse; had appt with rheumatoid arthritis MD at [facility] on Friday; has no autoimmune disease    Pertinent History  idiopathic peripheral neuropathy; pt was prescribed medication for migraines but states medication did not help; pt has sensorineural hearing loss (initally labyrinthitis? with sequelae?); h/o kidney cancer; chronic headaches    Patient Stated Goals  "would like to be comfortable enough to not have to use cane and be self sufficient"    Currently in Pain?  No/denies                      [facility] Adult PT Treatment/Exercise - 09/08/17 0001  Knee/Hip Exercises: Aerobic   Recumbent Bike  SciFit level 2.5 x 5" with UE's and LE's no charge due to unsupervised      Knee/Hip Exercises: Machines for Strengthening   Cybex Leg Press  40# bil. LE's x 10 reps          Balance Exercises - 09/08/17 1914      Balance Exercises: Standing   Standing Eyes Opened  Narrow base of support (BOS);Wide (BOA);Head turns;Foam/compliant surface;5 reps    Standing Eyes Closed  Narrow base of support (BOS);Wide (BOA);Head turns;Foam/compliant surface;5 reps    Rockerboard  Anterior/posterior;EO;30 seconds;Intermittent UE support    Partial Tandem Stance  Eyes open;Foam/compliant surface;Upper extremity support 1;5 reps standing on blue balance beam    Other Standing Exercises  Balance exercises perfomed standing on blue mat - alterante tap down to floor with horizontal head turns x 5 reps      Pt performed braiding inside // bars with UE support prn 10'x  4 reps  Amb. On tiptoes 10'x 2 inside //bars with UE support prn  Pt stood on Bosu inside // bars with one foot in middle - moving other leg up/back and laterally x 10 reps each for improved SLS;  Squats on Bosu x 5 reps with UE support prn  Pt amb. Making circles clockwise and counterclockwise and then "V" with ball with eye and head movement for improved gaze stabilization     PT Long Term Goals - 08/20/17 1220      PT LONG TERM GOAL #1   Title  Improve TUG score to </= 13.0 secs with cane for reduced fall risk.    Baseline  12.44 secs;  12.22    Status  Achieved      PT LONG TERM GOAL #2   Title  Improve DVA to </= 2 line difference for improved gaze stabilization.    Baseline  2 line difference with c/o dizziness upon completion of testing - rates dizziness 3/10 -- 07-02-17; rates dizziness 2/10 after completion of test    Status  On-going      PT LONG TERM GOAL #3   Title  Increase gait velocity from 2.52 to >/= 3.1 ft/sec with cane for incr. gait efficiency.    Baseline  10.19 secs = 3.22 ft/sec;  11.38 secs     Status  Achieved      PT LONG TERM GOAL #4   Title  Pt will report at least 25% improvement in vertigo.    Baseline  45% improvement reported on 06-04-17    Status  Achieved      PT LONG TERM GOAL #5   Title  Independent in HEP for balance and vestibular exercises.    Status  Achieved      PT LONG TERM GOAL #6   Title  Pt will report at least 50% improvement in vertigo.    Baseline  pt reports status varies depending on intensity of vertigo/balance - 08-13-17    Status  Partially Met      PT LONG TERM GOAL #7   Title  Pt will amb. 58' with horizontal head turns without moderate LOB with dizziness rating </= 3/10 intensity.    Baseline  dizziness 5/10 intensity  08-13-17    Status  On-going      PT LONG TERM GOAL #8   Title  Amb. 300' modified independently without cane on flat,even surface without LOB.    Status  Achieved  PT LONG TERM GOAL  #9    TITLE  Pt will improve DHI score from 72% to </= 48% to demo improvement in vertigo.    Status  New      PT LONG TERM GOAL  #10   TITLE  Improve Lt SLS to >/= 6 secs     Baseline  4.75 secs on LLE    Status  New            Plan - 09/08/17 1919    Clinical Impression Statement  Pt had more unsteadiness today with vertical head turns than with horizontal head turns; pt needed intermittent UE support to maintain balance on compliant surfaces with tandem stance and with SLS      Rehab Potential  Good    PT Frequency  2x / week    PT Duration  4 weeks    PT Treatment/Interventions  ADLs/Self Care Home Management;Gait training;Stair training;Therapeutic activities;Therapeutic exercise;Balance training;Neuromuscular re-education;Patient/family education;Vestibular    PT Next Visit Plan  continue with balance/vestibular activities    PT Home Exercise Plan  x1 viewing; balance on foam added 05-19-17; added heel lifts and heel cord stretch on 06-04-17    Consulted and Agree with Plan of Care  Patient       Patient will benefit from skilled therapeutic intervention in order to improve the following deficits and impairments:  Abnormal gait, Decreased balance, Difficulty walking, Dizziness  Visit Diagnosis: Dizziness and giddiness  Unsteadiness on feet     Problem List Patient Active Problem List   Diagnosis Date Noted  . S/P partial resection of colon 04/10/2015  . Cirrhosis ([place]) 12/08/2014  . Esophageal reflux 12/08/2014  . Esophageal stricture 12/08/2014  . Vertigo 12/08/2014    Alda Lea, PT 09/08/2017, 7:24 PM  [facility] [address] [place], [place], [address] Phone: [phone]   Fax:  [phone]  Name: Brenda [name] MRN: [id number] Date of Birth: Aug 28, 1945

## 2017-09-09 ENCOUNTER — Encounter: Payer: Self-pay | Admitting: Family Medicine

## 2017-09-10 ENCOUNTER — Ambulatory Visit: Payer: Medicare Other | Admitting: Physical Therapy

## 2017-09-10 DIAGNOSIS — R42 Dizziness and giddiness: Secondary | ICD-10-CM

## 2017-09-10 DIAGNOSIS — R2689 Other abnormalities of gait and mobility: Secondary | ICD-10-CM | POA: Diagnosis not present

## 2017-09-10 DIAGNOSIS — R2681 Unsteadiness on feet: Secondary | ICD-10-CM | POA: Diagnosis not present

## 2017-09-11 ENCOUNTER — Encounter: Payer: Self-pay | Admitting: Physical Therapy

## 2017-09-11 NOTE — Therapy (Signed)
[facility] [address] [place], [place], [address] Phone: [phone]   Fax:  [phone]  Physical Therapy Treatment  Patient Details  Name: Brenda [name] MRN: [id number] Date of Birth: 1946-02-11 Referring Provider: Colin Benton, DO   Encounter Date: 09/10/2017  PT End of Session - 09/11/17 1132    Visit Number  19    Number of Visits  22    Date for PT Re-Evaluation  10/11/17    Authorization Type  MCR    Authorization Time Period  06-04-17 - 08-03-17;  08-10-17 - 09-11-16:  09-10-17 - 10-11-17    PT Start Time  1316    PT Stop Time  1400    PT Time Calculation (min)  44 min    Equipment Utilized During Treatment  Gait belt       Past Medical History:  Diagnosis Date  . Arthritis   . Bronchitis    hx of when smoked   . Cancer ([place]) 04/16/09   kidney cancer - tx by [facility] per her report released from f/u  . Cataracts, bilateral   . Diverticulitis 11/2004  . Endometriosis 1994  . GERD (gastroesophageal reflux disease)    hx hiatal hernia, hx esophageal stricture s/p dilation  . Headache   . Hepatitis   . History of chicken pox   . History of hiatal hernia   . History of measles   . History of mumps as a child   . Hx of hepatitis    with cirrhosis - per her report from [place] and eval in 2013 at [place]  . Hyperlipidemia   . Left ear pain   . Migraines    pt states vesticular migraines has dizziness in relation   . Numbness and tingling    feet bilat has had for 40 years  . Right knee pain   . Scarlet fever   . Shingles   . Urinary tract bacterial infections    hx of  . Vertigo    associated with headache, intermittent, chronic    Past Surgical History:  Procedure Laterality Date  . ABDOMINAL HYSTERECTOMY  1994   TAH/BSO due to endometriosis  . APPENDECTOMY    . CATARACT EXTRACTION W/ INTRAOCULAR LENS IMPLANT Bilateral 05/2015, 04/2015  . DIAGNOSTIC LAPAROSCOPY    . LAPAROSCOPIC  PARTIAL COLECTOMY N/A 04/10/2015   Procedure: LAPAROSCOPIC PARTIAL CECTOMY;  Surgeon: Ralene Ok, MD;  Location: [facility];  Service: General;  Laterality: N/A;  . LUNG BIOPSY  1996   Negative  . NEPHRECTOMY  04/16/09   Partial removal due to cancer  . OOPHORECTOMY    . TONSILLECTOMY    . TONSILLECTOMY      There were no vitals filed for this visit.  Subjective Assessment - 09/11/17 1051    Subjective  Pt states today is a good day - reports no dizziness at this time    Pertinent History  idiopathic peripheral neuropathy; pt was prescribed medication for migraines but states medication did not help; pt has sensorineural hearing loss (initally labyrinthitis? with sequelae?); h/o kidney cancer; chronic headaches    Patient Stated Goals  "would like to be comfortable enough to not have to use cane and be self sufficient"    Currently in Pain?  No/denies             Neuro Re-ed: Pt performed amb. With horizontal and vertical head turns 35' x 2 reps each direction Amb. With clockwise and counterclockwise  circles with ball - tracking with eyes and head  35'x 2 reps each direction Amb. With ball - making V movement for diagonal tracking for irmproved VOR and gaze stabilization     Pt rode SciFit level 2.0 x 8" with UE's and LE (no charge for this activity as it was unsupervised)         Balance Exercises - 09/11/17 1052      Balance Exercises: Standing   Standing Eyes Opened  Narrow base of support (BOS);Wide (BOA);Head turns;Foam/compliant surface;5 reps    Standing Eyes Closed  Narrow base of support (BOS);Wide (BOA);Head turns;Foam/compliant surface;5 reps    Rockerboard  Anterior/posterior;EO;30 seconds;Intermittent UE support    Partial Tandem Stance  Eyes open;Foam/compliant surface;5 reps wiht head turns horizontal and vertical    Retro Gait  1 rep 30' backwards on floor    Other Standing Exercises  Pt performed marching on incline and on decline on blue mat placed  on ramp; marching in place with EO and EC;  head turns both horiozntal na dvertical with EO and then with EC;  alternate stepping up/back and thne down back with head turns side to side iwth Eo with CGA to min assiist      Standing forward, side and backward kicks on blue mat for improved SLS - 10 reps each leg with CGA   Pt performed standing on Bosu - 10 squats with min. Bil. UE support Standing with 1 foot in middle - moving other leg forward, back and side x 10 reps with minimal UE support for improved SLS  Lateral and anterior/posterior weight shifts 10 reps each       PT Long Term Goals - 09/11/17 1057      PT LONG TERM GOAL #1   Title  Improve TUG score to </= 13.0 secs with cane for reduced fall risk.    Status  Achieved      PT LONG TERM GOAL #2   Title  Improve DVA to </= 2 line difference for improved gaze stabilization.    Baseline  2 line difference with c/o dizziness upon completion of testing - rates dizziness 3/10 -- 07-02-17; rates dizziness 2/10 after completion of test    Status  On-going    Target Date  10/02/17      PT LONG TERM GOAL #3   Title  Increase gait velocity from 2.52 to >/= 3.1 ft/sec with cane for incr. gait efficiency.    Baseline  10.19 secs = 3.22 ft/sec;  11.38 secs     Status  Achieved      PT LONG TERM GOAL #4   Title  Pt will report at least 25% improvement in vertigo.    Status  Achieved      PT LONG TERM GOAL #5   Title  Independent in HEP for balance and vestibular exercises.    Status  Achieved      PT LONG TERM GOAL #6   Title  Pt will report at least 50% improvement in vertigo.    Baseline  pt reports status varies depending on intensity of vertigo/balance - 08-13-17    Status  On-going    Target Date  10/02/17      PT LONG TERM GOAL #7   Title  Pt will amb. 73' with horizontal head turns without moderate LOB with dizziness rating </= 3/10 intensity.    Baseline  dizziness 5/10 intensity  08-13-17    Status  On-going  Target Date  10/02/17      PT LONG TERM GOAL #8   Title  Amb. 300' modified independently without cane on flat,even surface without LOB.    Status  Achieved      PT LONG TERM GOAL  #9   TITLE  Pt will improve DHI score from 72% to </= 48% to demo improvement in vertigo.    Baseline  72% 08-13-17    Status  New    Target Date  10/02/17      PT LONG TERM GOAL  #10   TITLE  Improve Lt SLS to >/= 6 secs     Baseline  4.75 secs on LLE    Status  New    Target Date  10/02/17            Plan - 09/11/17 1123    Clinical Impression Statement  Pt is progressing well towards goals;  vertigo is much improved per pt report with pt continuing to demo vestibular hypofunction as evidenced by incr/ c/o vertigo with balance activities on compliant surfaces with EC    Rehab Potential  Good    PT Frequency  2x / week    PT Duration  4 weeks    PT Treatment/Interventions  ADLs/Self Care Home Management;Gait training;Stair training;Therapeutic activities;Therapeutic exercise;Balance training;Neuromuscular re-education;Patient/family education;Vestibular    PT Next Visit Plan  continue with balance/vestibular activities    PT Home Exercise Plan  x1 viewing; balance on foam added 05-19-17; added heel lifts and heel cord stretch on 06-04-17    Consulted and Agree with Plan of Care  Patient       Patient will benefit from skilled therapeutic intervention in order to improve the following deficits and impairments:  Abnormal gait, Decreased balance, Difficulty walking, Dizziness  Visit Diagnosis: Dizziness and giddiness - Plan: PT plan of care cert/re-cert  Unsteadiness on feet - Plan: PT plan of care cert/re-cert  Other abnormalities of gait and mobility - Plan: PT plan of care cert/re-cert     Problem List Patient Active Problem List   Diagnosis Date Noted  . S/P partial resection of colon 04/10/2015  . Cirrhosis ([place]) 12/08/2014  . Esophageal reflux 12/08/2014  . Esophageal stricture  12/08/2014  . Vertigo 12/08/2014    Alda Lea, PT 09/11/2017, 11:33 AM  [facility] [address] [place], [place], [address] Phone: [phone]   Fax:  [phone]  Name: DEANDRIA KLUTE MRN: [id number] Date of Birth: 1946-07-05

## 2017-09-17 ENCOUNTER — Encounter: Payer: Self-pay | Admitting: Physical Therapy

## 2017-09-17 ENCOUNTER — Ambulatory Visit: Payer: Medicare Other | Admitting: Physical Therapy

## 2017-09-17 DIAGNOSIS — R42 Dizziness and giddiness: Secondary | ICD-10-CM | POA: Diagnosis not present

## 2017-09-17 DIAGNOSIS — R2681 Unsteadiness on feet: Secondary | ICD-10-CM

## 2017-09-17 DIAGNOSIS — R2689 Other abnormalities of gait and mobility: Secondary | ICD-10-CM | POA: Diagnosis not present

## 2017-09-17 NOTE — Therapy (Signed)
[facility] [address] [place], [place], [address] Phone: [phone]   Fax:  [phone]  Physical Therapy Treatment  Patient Details  Name: Brenda [name] MRN: [id number] Date of Birth: 03/14/1946 Referring Provider: Colin Benton, DO   Encounter Date: 09/17/2017  PT End of Session - 09/17/17 1022    Visit Number  20    Number of Visits  22    Date for PT Re-Evaluation  10/11/17    Authorization Type  MCR    Authorization Time Period  06-04-17 - 08-03-17;  08-10-17 - 09-11-16:  09-10-17 - 10-11-17    PT Start Time  0850    PT Stop Time  0936    PT Time Calculation (min)  46 min    Equipment Utilized During Treatment  Gait belt       Past Medical History:  Diagnosis Date  . Arthritis   . Bronchitis    hx of when smoked   . Cancer ([place]) 04/16/09   kidney cancer - [facility] per her report released from f/u  . Cataracts, bilateral   . Diverticulitis 11/2004  . Endometriosis 1994  . GERD (gastroesophageal reflux disease)    hx hiatal hernia, hx esophageal stricture s/p dilation  . Headache   . Hepatitis   . History of chicken pox   . History of hiatal hernia   . History of measles   . History of mumps as a child   . Hx of hepatitis    with cirrhosis - per her report from [place] and eval in 2013 at [place]  . Hyperlipidemia   . Left ear pain   . Migraines    pt states vesticular migraines has dizziness in relation   . Numbness and tingling    feet bilat has had for 40 years  . Right knee pain   . Scarlet fever   . Shingles   . Urinary tract bacterial infections    hx of  . Vertigo    associated with headache, intermittent, chronic    Past Surgical History:  Procedure Laterality Date  . ABDOMINAL HYSTERECTOMY  1994   TAH/BSO due to endometriosis  . APPENDECTOMY    . CATARACT EXTRACTION W/ INTRAOCULAR LENS IMPLANT Bilateral 05/2015, 04/2015  . DIAGNOSTIC LAPAROSCOPY    . LAPAROSCOPIC  PARTIAL COLECTOMY N/A 04/10/2015   Procedure: LAPAROSCOPIC PARTIAL CECTOMY;  Surgeon: Ralene Ok, MD;  Location: [facility];  Service: General;  Laterality: N/A;  . LUNG BIOPSY  1996   Negative  . NEPHRECTOMY  04/16/09   Partial removal due to cancer  . OOPHORECTOMY    . TONSILLECTOMY    . TONSILLECTOMY      There were no vitals filed for this visit.  Subjective Assessment - 09/17/17 1012    Subjective  Pt states she has had a setback - was putting up Christmas towels and linens on Saturday which involved alot of bending over, looking up and down and became very dizzy; states it has not subsided since Saturday - rates intensity 5- 5.5/10     Pertinent History  idiopathic peripheral neuropathy; pt was prescribed medication for migraines but states medication did not help; pt has sensorineural hearing loss (initally labyrinthitis? with sequelae?); h/o kidney cancer; chronic headaches    Patient Stated Goals  "would like to be comfortable enough to not have to use cane and be self sufficient"    Currently in Pain?  No/denies  [facility] Adult PT Treatment/Exercise - 09/17/17 0001      Ambulation/Gait   Ambulation/Gait  Yes    Ambulation/Gait Assistance  6: Modified independent (Device/Increase time)    Ambulation Distance (Feet)  350 Feet    Assistive device  None    Gait Pattern  Within Functional Limits    Ambulation Surface  Level;Indoor walking to allow dizziness to decrease in intensity      Knee/Hip Exercises: Aerobic   Recumbent Bike  SciFit level 2.5 x 5" with UE's and LE's no charge due to unsupervised      Vestibular Treatment/Exercise - 09/17/17 0001      Vestibular Treatment/Exercise   Gaze Exercises  X1 Viewing Horizontal;X1 Viewing Vertical      X1 Viewing Horizontal   Foot Position  bilateral stance    Reps  2    Comments  initial rep 12 head turns perfomred on floor, then on Airex      X1 Viewing Vertical   Foot Position   bilateral stance    Reps  2    Comments  initial rep performed on floor, then on Airex         Balance Exercises - 09/17/17 1013      Balance Exercises: Standing   Standing Eyes Opened  Wide (BOA);Narrow base of support (BOS);Foam/compliant surface;Solid surface;5 reps;Head turns stood on Airex and on incline/decline without mat on top    Standing Eyes Closed  Wide (BOA);Narrow base of support (BOS);Head turns;Foam/compliant surface;5 reps       Standing on Airex in corner - trunk rotation - directly across with EO, then with EC;  Touching walls with diagonal reaches "X"  On both sides with EO with CGA  Partial squats on Airex with EO x 3 reps, then with EC x 5 reps x 2 sets  Standing on Airex - marching in place, head turns with EO up/down and side to side;  Alternate tap down to floor with each foot, stanidng on Airex Added head turn toward side with tap down - CGA to min assist for balance   Amb. 30' in gym with quick turn around and stopping - 4 reps - SBA with balance for safety upon stopping   PT Long Term Goals - 09/11/17 1057      PT LONG TERM GOAL #1   Title  Improve TUG score to </= 13.0 secs with cane for reduced fall risk.    Status  Achieved      PT LONG TERM GOAL #2   Title  Improve DVA to </= 2 line difference for improved gaze stabilization.    Baseline  2 line difference with c/o dizziness upon completion of testing - rates dizziness 3/10 -- 07-02-17; rates dizziness 2/10 after completion of test    Status  On-going    Target Date  10/02/17      PT LONG TERM GOAL #3   Title  Increase gait velocity from 2.52 to >/= 3.1 ft/sec with cane for incr. gait efficiency.    Baseline  10.19 secs = 3.22 ft/sec;  11.38 secs     Status  Achieved      PT LONG TERM GOAL #4   Title  Pt will report at least 25% improvement in vertigo.    Status  Achieved      PT LONG TERM GOAL #5   Title  Independent in HEP for balance and vestibular exercises.    Status  Achieved  PT LONG TERM GOAL #6   Title  Pt will report at least 50% improvement in vertigo.    Baseline  pt reports status varies depending on intensity of vertigo/balance - 08-13-17    Status  On-going    Target Date  10/02/17      PT LONG TERM GOAL #7   Title  Pt will amb. 22' with horizontal head turns without moderate LOB with dizziness rating </= 3/10 intensity.    Baseline  dizziness 5/10 intensity  08-13-17    Status  On-going    Target Date  10/02/17      PT LONG TERM GOAL #8   Title  Amb. 300' modified independently without cane on flat,even surface without LOB.    Status  Achieved      PT LONG TERM GOAL  #9   TITLE  Pt will improve DHI score from 72% to </= 48% to demo improvement in vertigo.    Baseline  72% 08-13-17    Status  New    Target Date  10/02/17      PT LONG TERM GOAL  #10   TITLE  Improve Lt SLS to >/= 6 secs     Baseline  4.75 secs on LLE    Status  New    Target Date  10/02/17            Plan - 09/17/17 1025    Clinical Impression Statement  Pt reporting increased vertigo since Saturday and demonstrated more unsteadiness with activites requiring increased vestibular input, especially activities with EC; pt reported highest intensity of vertiog to 7 -7.5/10 but intensity decreased after seated rest period of  2 -2.5 minutes. Pt reported intensity at end of session to be back at 5/10 level.  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     PT Frequency  2x / week    PT Duration  4 weeks    PT Treatment/Interventions  ADLs/Self Care Home Management;Gait training;Stair training;Therapeutic activities;Therapeutic exercise;Balance training;Neuromuscular re-education;Patient/family education;Vestibular    PT Next Visit Plan  continue with balance/vestibular activities    PT Home Exercise Plan  x1 viewing; balance on foam added 05-19-17; added heel lifts and heel cord stretch on 06-04-17    Consulted and Agree with Plan of Care  Patient        Patient will benefit from skilled therapeutic intervention in order to improve the following deficits and impairments:  Abnormal gait, Decreased balance, Difficulty walking, Dizziness  Visit Diagnosis: Dizziness and giddiness  Unsteadiness on feet     Problem List Patient Active Problem List   Diagnosis Date Noted  . S/P partial resection of colon 04/10/2015  . Cirrhosis ([place]) 12/08/2014  . Esophageal reflux 12/08/2014  . Esophageal stricture 12/08/2014  . Vertigo 12/08/2014    Alda Lea, PT 09/17/2017, 10:36 AM  [facility] [address] [place], [place], [address] Phone: [phone]   Fax:  [phone]  Name: GLORIOUS FLICKER MRN: [id number] Date of Birth: [birth year]

## 2017-09-21 ENCOUNTER — Encounter: Payer: Self-pay | Admitting: Physical Therapy

## 2017-09-21 ENCOUNTER — Ambulatory Visit: Payer: Medicare Other | Admitting: Physical Therapy

## 2017-09-21 DIAGNOSIS — R2681 Unsteadiness on feet: Secondary | ICD-10-CM

## 2017-09-21 DIAGNOSIS — R42 Dizziness and giddiness: Secondary | ICD-10-CM | POA: Diagnosis not present

## 2017-09-21 DIAGNOSIS — R2689 Other abnormalities of gait and mobility: Secondary | ICD-10-CM | POA: Diagnosis not present

## 2017-09-21 NOTE — Therapy (Signed)
[facility] [address] [place], [place], [address] Phone: [phone]   Fax:  [phone]  Physical Therapy Treatment  Patient Details  Name: Brenda [name] MRN: [id number] Date of Birth: 1946-05-29 Referring Provider: Colin Benton, DO   Encounter Date: 09/21/2017  PT End of Session - 09/21/17 1519    Visit Number  21    Number of Visits  22    Date for PT Re-Evaluation  10/11/17    Authorization Type  MCR    Authorization Time Period  06-04-17 - 08-03-17;  08-10-17 - 09-11-16:  09-10-17 - 10-11-17    PT Start Time  1109    PT Stop Time  1152    PT Time Calculation (min)  43 min       Past Medical History:  Diagnosis Date  . Arthritis   . Bronchitis    hx of when smoked   . Cancer ([place]) 04/16/09   kidney cancer - [facility] per her report released from f/u  . Cataracts, bilateral   . Diverticulitis 11/2004  . Endometriosis 1994  . GERD (gastroesophageal reflux disease)    hx hiatal hernia, hx esophageal stricture s/p dilation  . Headache   . Hepatitis   . History of chicken pox   . History of hiatal hernia   . History of measles   . History of mumps as a child   . Hx of hepatitis    with cirrhosis - per her report from [place] and eval in 2013 at [facility]  . Hyperlipidemia   . Left ear pain   . Migraines    pt states vesticular migraines has dizziness in relation   . Numbness and tingling    feet bilat has had for 40 years  . Right knee pain   . Scarlet fever   . Shingles   . Urinary tract bacterial infections    hx of  . Vertigo    associated with headache, intermittent, chronic    Past Surgical History:  Procedure Laterality Date  . ABDOMINAL HYSTERECTOMY  1994   TAH/BSO due to endometriosis  . APPENDECTOMY    . CATARACT EXTRACTION W/ INTRAOCULAR LENS IMPLANT Bilateral 05/2015, 04/2015  . DIAGNOSTIC LAPAROSCOPY    . LAPAROSCOPIC PARTIAL COLECTOMY N/A 04/10/2015   Procedure:  LAPAROSCOPIC PARTIAL CECTOMY;  Surgeon: Ralene Ok, MD;  Location: [facility];  Service: General;  Laterality: N/A;  . LUNG BIOPSY  1996   Negative  . NEPHRECTOMY  04/16/09   Partial removal due to cancer  . OOPHORECTOMY    . TONSILLECTOMY    . TONSILLECTOMY      There were no vitals filed for this visit.  Subjective Assessment - 09/21/17 1502    Subjective  Pt rates dizziness 2.5/10 at beginning of session; states it took a while last Thursday after PT to settle down    Pertinent History  idiopathic peripheral neuropathy; pt was prescribed medication for migraines but states medication did not help; pt has sensorineural hearing loss (initally labyrinthitis? with sequelae?); h/o kidney cancer; chronic headaches    Patient Stated Goals  "would like to be comfortable enough to not have to use cane and be self sufficient"    Currently in Pain?  No/denies                      [facility] Adult PT Treatment/Exercise - 09/21/17 0001      Ambulation/Gait   Ambulation/Gait  Yes    Ambulation/Gait Assistance  5: Supervision    Ambulation Distance (Feet)  200 Feet    Assistive device  None    Gait Pattern  Within Functional Limits    Ambulation Surface  Level;Indoor    Gait Comments  amb. 63' with quick turns and stopping with CGA      Knee/Hip Exercises: Aerobic   Recumbent Bike  SciFit level 2.5 x 12" with UE's and LE's no charge due to unsupervised          Balance Exercises - 09/21/17 1508      Balance Exercises: Standing   Standing Eyes Opened  Wide (BOA);Narrow base of support (BOS);Foam/compliant surface;Solid surface;5 reps;Head turns stood on Airex and on incline/decline without mat on top    Standing Eyes Closed  Wide (BOA);Narrow base of support (BOS);Head turns;Foam/compliant surface;5 reps    Rockerboard  Anterior/posterior;EO;30 seconds;Intermittent UE support    Partial Tandem Stance  Eyes open;Foam/compliant surface;5 reps head turns horizontal and vertical     Other Standing Exercises  Pt performed cone taps on blue mat, 2 reps with each foot;  then performed fig. 8's around cones on mat x 2 reps with CGA                                    NeuroRe-ed:  Amb. Tracking ball - making circles clockwise, counterclockwise and "V" pattern - 35' each direction; pt reported no  signficant incr. c/o dizziness any direction  Pt performed marching on blue mat on incline/decline with EO with head turns; marching in place with EC with CGA to min assist Alternate stepping up/back and down/back with CGA with head turns horizontally 5 reps each leg     PT Long Term Goals - 09/11/17 1057      PT LONG TERM GOAL #1   Title  Improve TUG score to </= 13.0 secs with cane for reduced fall risk.    Status  Achieved      PT LONG TERM GOAL #2   Title  Improve DVA to </= 2 line difference for improved gaze stabilization.    Baseline  2 line difference with c/o dizziness upon completion of testing - rates dizziness 3/10 -- 07-02-17; rates dizziness 2/10 after completion of test    Status  On-going    Target Date  10/02/17      PT LONG TERM GOAL #3   Title  Increase gait velocity from 2.52 to >/= 3.1 ft/sec with cane for incr. gait efficiency.    Baseline  10.19 secs = 3.22 ft/sec;  11.38 secs     Status  Achieved      PT LONG TERM GOAL #4   Title  Pt will report at least 25% improvement in vertigo.    Status  Achieved      PT LONG TERM GOAL #5   Title  Independent in HEP for balance and vestibular exercises.    Status  Achieved      PT LONG TERM GOAL #6   Title  Pt will report at least 50% improvement in vertigo.    Baseline  pt reports status varies depending on intensity of vertigo/balance - 08-13-17    Status  On-going    Target Date  10/02/17      PT LONG TERM GOAL #7   Title  Pt will amb. 80' with horizontal head turns without moderate LOB  with dizziness rating </= 3/10 intensity.    Baseline  dizziness 5/10 intensity  08-13-17    Status   On-going    Target Date  10/02/17      PT LONG TERM GOAL #8   Title  Amb. 300' modified independently without cane on flat,even surface without LOB.    Status  Achieved      PT LONG TERM GOAL  #9   TITLE  Pt will improve DHI score from 72% to </= 48% to demo improvement in vertigo.    Baseline  72% 08-13-17    Status  New    Target Date  10/02/17      PT LONG TERM GOAL  #10   TITLE  Improve Lt SLS to >/= 6 secs     Baseline  4.75 secs on LLE    Status  New    Target Date  10/02/17            Plan - 09/21/17 1520    Clinical Impression Statement  Pt continues to c/o incr. dizziness with activities with EC and with quick turns. Pt reports dizziness increased to 6- 6.5/10 with dynamic activities with EC.    Rehab Potential  Good    PT Frequency  2x / week    PT Duration  4 weeks    PT Treatment/Interventions  ADLs/Self Care Home Management;Gait training;Stair training;Therapeutic activities;Therapeutic exercise;Balance training;Neuromuscular re-education;Patient/family education;Vestibular    PT Next Visit Plan  continue with balance/vestibular activities    PT Home Exercise Plan  x1 viewing; balance on foam added 05-19-17; added heel lifts and heel cord stretch on 06-04-17    Consulted and Agree with Plan of Care  Patient       Patient will benefit from skilled therapeutic intervention in order to improve the following deficits and impairments:  Abnormal gait, Decreased balance, Difficulty walking, Dizziness  Visit Diagnosis: Dizziness and giddiness  Unsteadiness on feet     Problem List Patient Active Problem List   Diagnosis Date Noted  . S/P partial resection of colon 04/10/2015  . Cirrhosis ([place]) 12/08/2014  . Esophageal reflux 12/08/2014  . Esophageal stricture 12/08/2014  . Vertigo 12/08/2014    Alda Lea, PT 09/21/2017, 3:27 PM  [facility] [address] [place], [place],  [address] Phone: [phone]   Fax:  [phone]  Name: ZHURI KRASS MRN: [id number] Date of Birth: 09-May-1946

## 2017-09-22 ENCOUNTER — Other Ambulatory Visit: Payer: Self-pay | Admitting: Gastroenterology

## 2017-09-22 DIAGNOSIS — Z86010 Personal history of colonic polyps: Secondary | ICD-10-CM | POA: Diagnosis not present

## 2017-09-22 DIAGNOSIS — K7460 Unspecified cirrhosis of liver: Secondary | ICD-10-CM | POA: Diagnosis not present

## 2017-09-22 DIAGNOSIS — K7469 Other cirrhosis of liver: Secondary | ICD-10-CM

## 2017-09-24 ENCOUNTER — Ambulatory Visit: Payer: Medicare Other | Admitting: Physical Therapy

## 2017-09-24 ENCOUNTER — Encounter: Payer: Self-pay | Admitting: Physical Therapy

## 2017-09-24 DIAGNOSIS — R42 Dizziness and giddiness: Secondary | ICD-10-CM

## 2017-09-24 DIAGNOSIS — R2681 Unsteadiness on feet: Secondary | ICD-10-CM

## 2017-09-24 DIAGNOSIS — R2689 Other abnormalities of gait and mobility: Secondary | ICD-10-CM | POA: Diagnosis not present

## 2017-09-24 NOTE — Therapy (Signed)
[facility] [address] [place], [place], [address] Phone: [phone]   Fax:  [phone]  Physical Therapy Treatment  Patient Details  Name: Brenda [name] MRN: [id number] Date of Birth: 1946/05/29 Referring Provider: Colin Benton, DO   Encounter Date: 09/24/2017  PT End of Session - 09/24/17 1955    Visit Number  22    Number of Visits  23    Date for PT Re-Evaluation  10/11/17    Authorization Type  MCR    Authorization Time Period  06-04-17 - 08-03-17;  08-10-17 - 09-11-16:  09-10-17 - 10-11-17    PT Start Time  1102    PT Stop Time  1151    PT Time Calculation (min)  49 min       Past Medical History:  Diagnosis Date  . Arthritis   . Bronchitis    hx of when smoked   . Cancer ([place]) 04/16/09   kidney cancer - tx by [facility] per her report released from f/u  . Cataracts, bilateral   . Diverticulitis 11/2004  . Endometriosis 1994  . GERD (gastroesophageal reflux disease)    hx hiatal hernia, hx esophageal stricture s/p dilation  . Headache   . Hepatitis   . History of chicken pox   . History of hiatal hernia   . History of measles   . History of mumps as a child   . Hx of hepatitis    with cirrhosis - per her report from [place] and eval in 2013 at [facility]  . Hyperlipidemia   . Left ear pain   . Migraines    pt states vesticular migraines has dizziness in relation   . Numbness and tingling    feet bilat has had for 40 years  . Right knee pain   . Scarlet fever   . Shingles   . Urinary tract bacterial infections    hx of  . Vertigo    associated with headache, intermittent, chronic    Past Surgical History:  Procedure Laterality Date  . ABDOMINAL HYSTERECTOMY  1994   TAH/BSO due to endometriosis  . APPENDECTOMY    . CATARACT EXTRACTION W/ INTRAOCULAR LENS IMPLANT Bilateral 05/2015, 04/2015  . DIAGNOSTIC LAPAROSCOPY    . LAPAROSCOPIC PARTIAL COLECTOMY N/A 04/10/2015   Procedure:  LAPAROSCOPIC PARTIAL CECTOMY;  Surgeon: Ralene Ok, MD;  Location: [facility];  Service: General;  Laterality: N/A;  . LUNG BIOPSY  1996   Negative  . NEPHRECTOMY  04/16/09   Partial removal due to cancer  . OOPHORECTOMY    . TONSILLECTOMY    . TONSILLECTOMY      There were no vitals filed for this visit.  Subjective Assessment - 09/24/17 1759    Subjective  Pt states she is doing a little better - rates dizziness 2/10 intensity at start of session    Pertinent History  idiopathic peripheral neuropathy; pt was prescribed medication for migraines but states medication did not help; pt has sensorineural hearing loss (initally labyrinthitis? with sequelae?); h/o kidney cancer; chronic headaches    Patient Stated Goals  "would like to be comfortable enough to not have to use cane and be self sufficient"    Currently in Pain?  No/denies                      [facility] Adult PT Treatment/Exercise - 09/24/17 0001      Ambulation/Gait   Ambulation/Gait  Yes  Ambulation/Gait Assistance  5: Supervision    Ambulation Distance (Feet)  230 Feet    Assistive device  None    Gait Pattern  Within Functional Limits    Ambulation Surface  Level;Indoor    Gait Comments  amb. tossing ball and catching;  amb with clockwise and counterclockwise circles tracking ball and also lmaking letter  "V" with ball with visual tracking and with head movements      High Level Balance   High Level Balance Activities  Backward walking;Turns;Sudden stops;Head turns      Knee/Hip Exercises: Aerobic   Recumbent Bike  SciFit level 2.5 x 8" with UE's and LE's no charge due to unsupervised          Balance Exercises - 09/24/17 1952      Balance Exercises: Standing   Standing Eyes Opened  Wide ([place]);Narrow base of support (BOS);Foam/compliant surface;Solid surface;5 reps;Head turns stood on Airex and on incline/decline without mat on top    Standing Eyes Closed  Wide (BOA);Narrow base of support  (BOS);Head turns;Foam/compliant surface;5 reps    Partial Tandem Stance  Eyes open;Foam/compliant surface;5 reps head turns horizontal and vertical    Other Standing Exercises  Pt performed marching on incline and decline with EO and EC - with head turns with marching with EO  with CGA to min assist      Standing on Bosu - weight shifts anterior/posteriorly, moving each leg up/back and laterally with min. UE support for improved  SLS; standing with EC with min UE support       PT Long Term Goals - 09/11/17 1057      PT LONG TERM GOAL #1   Title  Improve TUG score to </= 13.0 secs with cane for reduced fall risk.    Status  Achieved      PT LONG TERM GOAL #2   Title  Improve DVA to </= 2 line difference for improved gaze stabilization.    Baseline  2 line difference with c/o dizziness upon completion of testing - rates dizziness 3/10 -- 07-02-17; rates dizziness 2/10 after completion of test    Status  On-going    Target Date  10/02/17      PT LONG TERM GOAL #3   Title  Increase gait velocity from 2.52 to >/= 3.1 ft/sec with cane for incr. gait efficiency.    Baseline  10.19 secs = 3.22 ft/sec;  11.38 secs     Status  Achieved      PT LONG TERM GOAL #4   Title  Pt will report at least 25% improvement in vertigo.    Status  Achieved      PT LONG TERM GOAL #5   Title  Independent in HEP for balance and vestibular exercises.    Status  Achieved      PT LONG TERM GOAL #6   Title  Pt will report at least 50% improvement in vertigo.    Baseline  pt reports status varies depending on intensity of vertigo/balance - 08-13-17    Status  On-going    Target Date  10/02/17      PT LONG TERM GOAL #7   Title  Pt will amb. 38' with horizontal head turns without moderate LOB with dizziness rating </= 3/10 intensity.    Baseline  dizziness 5/10 intensity  08-13-17    Status  On-going    Target Date  10/02/17      PT LONG TERM GOAL #8  Title  Amb. 300' modified independently without  cane on flat,even surface without LOB.    Status  Achieved      PT LONG TERM GOAL  #9   TITLE  Pt will improve DHI score from 72% to </= 48% to demo improvement in vertigo.    Baseline  72% 08-13-17    Status  New    Target Date  10/02/17      PT LONG TERM GOAL  #10   TITLE  Improve Lt SLS to >/= 6 secs     Baseline  4.75 secs on LLE    Status  New    Target Date  10/02/17            Plan - 09/24/17 1955    Clinical Impression Statement  Pt reported less dizziness today than in previous session on Monday this week; less LOB was noted with dynamic standing activities on compliant surfaces; pt progressing well towards goals - plan D/C next week    PT Frequency  2x / week    PT Duration  4 weeks    PT Treatment/Interventions  ADLs/Self Care Home Management;Gait training;Stair training;Therapeutic activities;Therapeutic exercise;Balance training;Neuromuscular re-education;Patient/family education;Vestibular    PT Next Visit Plan  check LTG's - D/C    PT Home Exercise Plan  x1 viewing; balance on foam added 05-19-17; added heel lifts and heel cord stretch on 06-04-17    Consulted and Agree with Plan of Care  Patient       Patient will benefit from skilled therapeutic intervention in order to improve the following deficits and impairments:  Abnormal gait, Decreased balance, Difficulty walking, Dizziness  Visit Diagnosis: Unsteadiness on feet  Dizziness and giddiness     Problem List Patient Active Problem List   Diagnosis Date Noted  . S/P partial resection of colon 04/10/2015  . Cirrhosis ([place]) 12/08/2014  . Esophageal reflux 12/08/2014  . Esophageal stricture 12/08/2014  . Vertigo 12/08/2014    Alda Lea, PT 09/24/2017, 8:02 PM  [facility] [address] [place], [place], [address] Phone: [phone]   Fax:  [phone]  Name: Brenda [name] MRN: [id number] Date of Birth: Jan 21, 1946

## 2017-09-28 ENCOUNTER — Encounter: Payer: Medicare Other | Admitting: Physical Therapy

## 2017-10-01 ENCOUNTER — Ambulatory Visit: Payer: Medicare Other | Attending: Family Medicine | Admitting: Physical Therapy

## 2017-10-01 VITALS — BP 137/89

## 2017-10-01 DIAGNOSIS — R42 Dizziness and giddiness: Secondary | ICD-10-CM | POA: Diagnosis not present

## 2017-10-01 DIAGNOSIS — R2681 Unsteadiness on feet: Secondary | ICD-10-CM | POA: Diagnosis not present

## 2017-10-01 DIAGNOSIS — R2689 Other abnormalities of gait and mobility: Secondary | ICD-10-CM | POA: Diagnosis not present

## 2017-10-02 ENCOUNTER — Encounter: Payer: Self-pay | Admitting: Physical Therapy

## 2017-10-02 ENCOUNTER — Ambulatory Visit
Admission: RE | Admit: 2017-10-02 | Discharge: 2017-10-02 | Disposition: A | Discharge status: Home / Self Care | Payer: Medicare Other | Source: Ambulatory Visit | Attending: Gastroenterology | Admitting: Gastroenterology

## 2017-10-02 DIAGNOSIS — K7460 Unspecified cirrhosis of liver: Secondary | ICD-10-CM | POA: Diagnosis not present

## 2017-10-02 DIAGNOSIS — K7469 Other cirrhosis of liver: Secondary | ICD-10-CM

## 2017-10-02 NOTE — Therapy (Signed)
[facility] [address] [place], [place], [address] Phone: [phone]   Fax:  [phone]  Physical Therapy Treatment  Patient Details  Name: Brenda [name] MRN: [id number] Date of Birth: [birth year] Referring Provider: Colin Benton, DO   Encounter Date: 10/01/2017  PT End of Session - 10/02/17 1043    PT Start Time  1103    PT Stop Time  1151    PT Time Calculation (min)  48 min       Past Medical History:  Diagnosis Date  . Arthritis   . Bronchitis    hx of when smoked   . Cancer ([place]) 04/16/09   kidney cancer - [facility] per her report released from f/u  . Cataracts, bilateral   . Diverticulitis 11/2004  . Endometriosis 1994  . GERD (gastroesophageal reflux disease)    hx hiatal hernia, hx esophageal stricture s/p dilation  . Headache   . Hepatitis   . History of chicken pox   . History of hiatal hernia   . History of measles   . History of mumps as a child   . Hx of hepatitis    with cirrhosis - per her report from [place] and eval in 2013 at [facility]  . Hyperlipidemia   . Left ear pain   . Migraines    pt states vesticular migraines has dizziness in relation   . Numbness and tingling    feet bilat has had for 40 years  . Right knee pain   . Scarlet fever   . Shingles   . Urinary tract bacterial infections    hx of  . Vertigo    associated with headache, intermittent, chronic    Past Surgical History:  Procedure Laterality Date  . ABDOMINAL HYSTERECTOMY  1994   TAH/BSO due to endometriosis  . APPENDECTOMY    . CATARACT EXTRACTION W/ INTRAOCULAR LENS IMPLANT Bilateral 05/2015, 04/2015  . DIAGNOSTIC LAPAROSCOPY    . LAPAROSCOPIC PARTIAL COLECTOMY N/A 04/10/2015   Procedure: LAPAROSCOPIC PARTIAL CECTOMY;  Surgeon: Ralene Ok, MD;  Location: [facility];  Service: General;  Laterality: N/A;  . LUNG BIOPSY  1996   Negative  . NEPHRECTOMY  04/16/09   Partial removal due to cancer  .  OOPHORECTOMY    . TONSILLECTOMY    . TONSILLECTOMY      Vitals:   10/01/17 1142  BP: 137/89    Subjective Assessment - 10/02/17 1015    Subjective  Pt states she continues to be unsteady and dizzy - is feeling frustrated that she has not done well since righ after Christmas; does not know what is going on     Pertinent History  idiopathic peripheral neuropathy; pt was prescribed medication for migraines but states medication did not help; pt has sensorineural hearing loss (initally labyrinthitis? with sequelae?); h/o kidney cancer; chronic headaches    Patient Stated Goals  "would like to be comfortable enough to not have to use cane and be self sufficient"    Currently in Pain?  No/denies                      [facility] Adult PT Treatment/Exercise - 10/02/17 0001      Ambulation/Gait   Ambulation/Gait  Yes    Ambulation/Gait Assistance  5: Supervision    Ambulation Distance (Feet)  100 Feet    Assistive device  None    Gait Pattern  Within Functional Limits  Ambulation Surface  Level;Indoor    Gait Comments  pt amb. with horizontal and vertical head turns; with quick stops and turns 180 degrees with unsteadiness but with pt able to independntly recover LOB      High Level Balance   High Level Balance Activities  Backward walking;Turns;Sudden stops;Head turns      Knee/Hip Exercises: Aerobic   Recumbent Bike  SciFit level 2.5 x 12" with UE's and LE's no charge due to unsupervised          Balance Exercises - 10/02/17 1043      Balance Exercises: Standing   Standing Eyes Opened  Wide (BOA);Narrow base of support (BOS);Foam/compliant surface;Solid surface;5 reps;Head turns stood on Airex and on incline/decline without mat on top    Standing Eyes Closed  Wide (BOA);Narrow base of support (BOS);Head turns;Foam/compliant surface;5 reps    Other Standing Exercises  Pt performed marching on compliant surface with EO and EC with head turns side to side with CGA to SBA        DHI score 84% (decrease from score of 72% in Dec. 2018)      PT Long Term Goals - 10/01/17 1108      PT LONG TERM GOAL #1   Title  Improve TUG score to </= 13.0 secs with cane for reduced fall risk.    Status  Achieved      PT LONG TERM GOAL #2   Title  Improve DVA to </= 2 line difference for improved gaze stabilization.    Baseline  2 line difference with c/o dizziness upon completion of testing - rates dizziness 3/10 -- 07-02-17; rates dizziness 2/10 after completion of test    Status  On-going    Target Date  10/29/17      PT LONG TERM GOAL #3   Title  Increase gait velocity from 2.52 to >/= 3.1 ft/sec with cane for incr. gait efficiency.    Baseline  10.19 secs = 3.22 ft/sec;  11.38 secs    13.19 secs on 10-01-17    Status  Achieved      PT LONG TERM GOAL #4   Title  Pt will report at least 25% improvement in vertigo.    Baseline  45% improvement reported on 06-04-17; pt reports increased vertigo experienced since right after Christmas - 10-01-17    Status  On-going    Target Date  10/29/17      PT LONG TERM GOAL #5   Title  Independent in HEP for balance and vestibular exercises.    Status  Achieved      PT LONG TERM GOAL #6   Title  Pt will report at least 50% improvement in vertigo.    Baseline  pt reports status varies depending on intensity of vertigo/balance - 08-13-17; pt states vertigo has stayed constant since Christmas (2.5-3/10 - 6.5)    Status  On-going    Target Date  10/29/17      PT LONG TERM GOAL #7   Title  Pt will amb. 60' with horizontal head turns without moderate LOB with dizziness rating </= 3/10 intensity.    Baseline  dizziness 5/10 intensity  08-13-17;; dizziness rating 6/10 on 10-01-17    Status  On-going    Target Date  10/29/17      PT LONG TERM GOAL #8   Title  Amb. 300' modified independently without cane on flat,even surface without LOB.    Status  Achieved      PT  LONG TERM GOAL  #9   TITLE  Pt will improve DHI score from 72% to  </= 48% to demo improvement in vertigo. REVISED:  Improve DHI score from 84% on 10-01-17 to </ 60%      Baseline  72% 08-13-17;  84% on 10-01-17    Status  Revised    Target Date  10/29/17      PT LONG TERM GOAL  #10   TITLE  Improve Lt SLS to >/= 6 secs     Baseline  4.75 secs on LLE = 7.62 secs on LLE on 10-01-17    Status  Achieved            Plan - 10/02/17 1044    Clinical Impression Statement  Pt reports increased dizziness and unsteadiness experienced since end of Dec. 2018 with reason unknown.  Pt's DHI score has decreased from 72% in mid-Dec. to currently 84% - this is a self perceived disability rating scale based on the vertigo.  Will continue PT addiitonal 4 weeks in hopes of progressing to pt's  funcitonal status  in mid- Dec.      PT Frequency  2x / week    PT Duration  4 weeks    PT Treatment/Interventions  ADLs/Self Care Home Management;Gait training;Stair training;Therapeutic activities;Therapeutic exercise;Balance training;Neuromuscular re-education;Patient/family education;Vestibular    PT Next Visit Plan  cont balance/vestibular exercises    PT Home Exercise Plan  x1 viewing; balance on foam added 05-19-17; added heel lifts and heel cord stretch on 06-04-17    Consulted and Agree with Plan of Care  Patient       Patient will benefit from skilled therapeutic intervention in order to improve the following deficits and impairments:  Abnormal gait, Decreased balance, Difficulty walking, Dizziness  Visit Diagnosis: Unsteadiness on feet - Plan: PT plan of care cert/re-cert  Dizziness and giddiness - Plan: PT plan of care cert/re-cert  Other abnormalities of gait and mobility - Plan: PT plan of care cert/re-cert     Problem List Patient Active Problem List   Diagnosis Date Noted  . S/P partial resection of colon 04/10/2015  . Cirrhosis ([place]) 12/08/2014  . Esophageal reflux 12/08/2014  . Esophageal stricture 12/08/2014  . Vertigo 12/08/2014    Alda Lea, PT 10/02/2017, 10:54 AM  [facility] [address] [place], [place], [address] Phone: [phone]   Fax:  [phone]  Name: Brenda [name] MRN: [id number] Date of Birth: [birth year]

## 2017-10-08 ENCOUNTER — Ambulatory Visit: Payer: Medicare Other | Admitting: Physical Therapy

## 2017-10-08 DIAGNOSIS — R2681 Unsteadiness on feet: Secondary | ICD-10-CM

## 2017-10-08 DIAGNOSIS — R2689 Other abnormalities of gait and mobility: Secondary | ICD-10-CM | POA: Diagnosis not present

## 2017-10-08 DIAGNOSIS — R42 Dizziness and giddiness: Secondary | ICD-10-CM

## 2017-10-09 ENCOUNTER — Encounter: Payer: Self-pay | Admitting: Physical Therapy

## 2017-10-09 NOTE — Therapy (Addendum)
[facility] [address] [place], [place], [address] Phone: [phone]   Fax:  [phone]  Physical Therapy Treatment  Patient Details  Name: Brenda [name] MRN: [id number] Date of Birth: 02-10-1946 Referring Provider: Colin Benton, DO   Encounter Date: 10/08/2017    Past Medical History:  Diagnosis Date  . Arthritis   . Bronchitis    hx of when smoked   . Cancer ([place]) 04/16/09   kidney cancer - tx by [facility] per her report released from f/u  . Cataracts, bilateral   . Diverticulitis 11/2004  . Endometriosis 1994  . GERD (gastroesophageal reflux disease)    hx hiatal hernia, hx esophageal stricture s/p dilation  . Headache   . Hepatitis   . History of chicken pox   . History of hiatal hernia   . History of measles   . History of mumps as a child   . Hx of hepatitis    with cirrhosis - per her report from [place] and eval in 2013 at [facility]  . Hyperlipidemia   . Left ear pain   . Migraines    pt states vesticular migraines has dizziness in relation   . Numbness and tingling    feet bilat has had for 40 years  . Right knee pain   . Scarlet fever   . Shingles   . Urinary tract bacterial infections    hx of  . Vertigo    associated with headache, intermittent, chronic    Past Surgical History:  Procedure Laterality Date  . ABDOMINAL HYSTERECTOMY  1994   TAH/BSO due to endometriosis  . APPENDECTOMY    . CATARACT EXTRACTION W/ INTRAOCULAR LENS IMPLANT Bilateral 05/2015, 04/2015  . DIAGNOSTIC LAPAROSCOPY    . LAPAROSCOPIC PARTIAL COLECTOMY N/A 04/10/2015   Procedure: LAPAROSCOPIC PARTIAL CECTOMY;  Surgeon: Ralene Ok, MD;  Location: [facility];  Service: General;  Laterality: N/A;  . LUNG BIOPSY  1996   Negative  . NEPHRECTOMY  04/16/09   Partial removal due to cancer  . OOPHORECTOMY    . TONSILLECTOMY    . TONSILLECTOMY      There were no vitals filed for this visit.  Subjective  Assessment - 10/11/17 2024    Subjective  Pt reports she is a little dizzy today (rates intensity 3/10)    Pertinent History  idiopathic peripheral neuropathy; pt was prescribed medication for migraines but states medication did not help; pt has sensorineural hearing loss (initally labyrinthitis? with sequelae?); h/o kidney cancer; chronic headaches    Patient Stated Goals  "would like to be comfortable enough to not have to use cane and be self sufficient"                      [facility] Adult PT Treatment/Exercise - 10/11/17 0001      Ambulation/Gait   Ambulation/Gait  Yes    Ambulation/Gait Assistance  5: Supervision    Ambulation Distance (Feet)  230 Feet    Assistive device  None    Gait Pattern  Within Functional Limits    Ambulation Surface  Level;Indoor    Gait Comments  Pt amb. tossing ball 40' x 1;  tracking ball while making clockwise and counterclockwise circles with ball for improved gaze stabilization      High Level Balance   High Level Balance Activities  Braiding;Backward walking;Sudden stops;Turns      Knee/Hip Exercises: Aerobic   Recumbent Bike  SciFit level 2.5  x 10" with UE's and LE's no charge as unsupervised          Balance Exercises - 10/11/17 2030      Balance Exercises: Standing   Standing Eyes Opened  Wide (BOA);Narrow base of support (BOS);Foam/compliant surface;Solid surface;5 reps;Head turns stood on Airex and on incline/decline without mat on top    Standing Eyes Closed  Wide ([place]);Narrow base of support (BOS);Head turns;Foam/compliant surface;5 reps    SLS with Vectors  Foam/compliant surface;5 reps on Bosu inside // bars    Stepping Strategy  Anterior;Lateral;Foam/compliant surface;5 reps    Partial Tandem Stance  Eyes open;Foam/compliant surface;5 reps head turns horizontal and vertical    Other Standing Exercises  Performed marching on incline and on decline with EO and EC - with head turns sid eto side             PT Long  Term Goals - 10/01/17 1108      PT LONG TERM GOAL #1   Title  Improve TUG score to </= 13.0 secs with cane for reduced fall risk.    Status  Achieved      PT LONG TERM GOAL #2   Title  Improve DVA to </= 2 line difference for improved gaze stabilization.    Baseline  2 line difference with c/o dizziness upon completion of testing - rates dizziness 3/10 -- 07-02-17; rates dizziness 2/10 after completion of test    Status  On-going    Target Date  10/29/17      PT LONG TERM GOAL #3   Title  Increase gait velocity from 2.52 to >/= 3.1 ft/sec with cane for incr. gait efficiency.    Baseline  10.19 secs = 3.22 ft/sec;  11.38 secs    13.19 secs on 10-01-17    Status  Achieved      PT LONG TERM GOAL #4   Title  Pt will report at least 25% improvement in vertigo.    Baseline  45% improvement reported on 06-04-17; pt reports increased vertigo experienced since right after Christmas - 10-01-17    Status  On-going    Target Date  10/29/17      PT LONG TERM GOAL #5   Title  Independent in HEP for balance and vestibular exercises.    Status  Achieved      PT LONG TERM GOAL #6   Title  Pt will report at least 50% improvement in vertigo.    Baseline  pt reports status varies depending on intensity of vertigo/balance - 08-13-17; pt states vertigo has stayed constant since Christmas (2.5-3/10 - 6.5)    Status  On-going    Target Date  10/29/17      PT LONG TERM GOAL #7   Title  Pt will amb. 26' with horizontal head turns without moderate LOB with dizziness rating </= 3/10 intensity.    Baseline  dizziness 5/10 intensity  08-13-17;; dizziness rating 6/10 on 10-01-17    Status  On-going    Target Date  10/29/17      PT LONG TERM GOAL #8   Title  Amb. 300' modified independently without cane on flat,even surface without LOB.    Status  Achieved      PT LONG TERM GOAL  #9   TITLE  Pt will improve DHI score from 72% to </= 48% to demo improvement in vertigo. REVISED:  Improve DHI score from 84% on  10-01-17 to </ 60%      Baseline  72% 08-13-17;  84% on 10-01-17    Status  Revised    Target Date  10/29/17      PT LONG TERM GOAL  #10   TITLE  Improve Lt SLS to >/= 6 secs     Baseline  4.75 secs on LLE = 7.62 secs on LLE on 10-01-17    Status  Achieved            Plan - 10/11/17 2034    Clinical Impression Statement  Pt progressing well towards goals - pt reported dizziness with activities with EC on compliant surfaces and with turns but able to continue with activities with minimal rest breaks    Rehab Potential  Good    PT Frequency  2x / week    PT Duration  4 weeks    PT Treatment/Interventions  ADLs/Self Care Home Management;Gait training;Stair training;Therapeutic activities;Therapeutic exercise;Balance training;Neuromuscular re-education;Patient/family education;Vestibular    PT Next Visit Plan  cont balance/vestibular exercises    PT Home Exercise Plan  x1 viewing; balance on foam added 05-19-17; added heel lifts and heel cord stretch on 06-04-17    Consulted and Agree with Plan of Care  Patient       Patient will benefit from skilled therapeutic intervention in order to improve the following deficits and impairments:  Abnormal gait, Decreased balance, Difficulty walking, Dizziness  Visit Diagnosis: Unsteadiness on feet  Dizziness and giddiness     Problem List Patient Active Problem List   Diagnosis Date Noted  . S/P partial resection of colon 04/10/2015  . Cirrhosis ([place]) 12/08/2014  . Esophageal reflux 12/08/2014  . Esophageal stricture 12/08/2014  . Vertigo 12/08/2014    Alda Lea, PT 10/11/2017, 8:37 PM  [facility] [address] [place], [place], [address] Phone: [phone]   Fax:  [phone]  Name: Brenda [name] MRN: [id number] Date of Birth: 1946-06-30

## 2017-10-19 ENCOUNTER — Encounter: Payer: Self-pay | Admitting: Physical Therapy

## 2017-10-19 ENCOUNTER — Ambulatory Visit: Payer: Medicare Other | Admitting: Physical Therapy

## 2017-10-19 DIAGNOSIS — R42 Dizziness and giddiness: Secondary | ICD-10-CM

## 2017-10-19 DIAGNOSIS — R2689 Other abnormalities of gait and mobility: Secondary | ICD-10-CM | POA: Diagnosis not present

## 2017-10-19 DIAGNOSIS — R2681 Unsteadiness on feet: Secondary | ICD-10-CM

## 2017-10-19 NOTE — Therapy (Signed)
[facility] [address] [place], [place], [address] Phone: [phone]   Fax:  [phone]  Physical Therapy Treatment  Patient Details  Name: Brenda [name] MRN: [id number] Date of Birth: 1946-04-09 Referring Provider: Colin Benton, DO   Encounter Date: 10/19/2017  PT End of Session - 10/19/17 2055    Visit Number  25    Number of Visits  31    Date for PT Re-Evaluation  10/29/17    Authorization Type  MCR    Authorization Time Period  06-04-17 - 08-03-17;  08-10-17 - 09-11-16:  09-10-17 - 10-11-17; 10-01-17 - 11-22-17    PT Start Time  0933    PT Stop Time  1017    PT Time Calculation (min)  44 min    Equipment Utilized During Treatment  Gait belt       Past Medical History:  Diagnosis Date  . Arthritis   . Bronchitis    hx of when smoked   . Cancer ([place]) 04/16/09   kidney cancer - tx by [facility] per her report released from f/u  . Cataracts, bilateral   . Diverticulitis 11/2004  . Endometriosis 1994  . GERD (gastroesophageal reflux disease)    hx hiatal hernia, hx esophageal stricture s/p dilation  . Headache   . Hepatitis   . History of chicken pox   . History of hiatal hernia   . History of measles   . History of mumps as a child   . Hx of hepatitis    with cirrhosis - per her report from [place] and eval in 2013 at [place]  . Hyperlipidemia   . Left ear pain   . Migraines    pt states vesticular migraines has dizziness in relation   . Numbness and tingling    feet bilat has had for 40 years  . Right knee pain   . Scarlet fever   . Shingles   . Urinary tract bacterial infections    hx of  . Vertigo    associated with headache, intermittent, chronic    Past Surgical History:  Procedure Laterality Date  . ABDOMINAL HYSTERECTOMY  1994   TAH/BSO due to endometriosis  . APPENDECTOMY    . CATARACT EXTRACTION W/ INTRAOCULAR LENS IMPLANT Bilateral 05/2015, 04/2015  . DIAGNOSTIC LAPAROSCOPY     . LAPAROSCOPIC PARTIAL COLECTOMY N/A 04/10/2015   Procedure: LAPAROSCOPIC PARTIAL CECTOMY;  Surgeon: Ralene Ok, MD;  Location: [facility];  Service: General;  Laterality: N/A;  . LUNG BIOPSY  1996   Negative  . NEPHRECTOMY  04/16/09   Partial removal due to cancer  . OOPHORECTOMY    . TONSILLECTOMY    . TONSILLECTOMY      There were no vitals filed for this visit.  Subjective Assessment - 10/19/17 2030    Subjective  Pt reports dizziness this morning is about a 2 - 2 1/2 intensity - "not too bad today"    Pertinent History  idiopathic peripheral neuropathy; pt was prescribed medication for migraines but states medication did not help; pt has sensorineural hearing loss (initally labyrinthitis? with sequelae?); h/o kidney cancer; chronic headaches    Patient Stated Goals  "would like to be comfortable enough to not have to use cane and be self sufficient"    Currently in Pain?  No/denies                      [facility] Adult PT Treatment/Exercise - 10/19/17  0952      Ambulation/Gait   Ambulation/Gait  Yes    Ambulation/Gait Assistance  5: Supervision    Ambulation Distance (Feet)  300 Feet    Assistive device  None    Gait Pattern  Within Functional Limits    Ambulation Surface  Level;Indoor    Gait Comments  Pt amb. 50' x 6 reps with sudden stops and 180 degree turns - alternating turn to Lt and Rt sides;       High Level Balance   High Level Balance Comments  Pt performed crossovers on blue mat with UE support prn with CGA      Knee/Hip Exercises: Aerobic   Elliptical  level 1.6 x 1" forward          Balance Exercises - 10/19/17 2052      Balance Exercises: Standing   Standing Eyes Opened  Narrow base of support (BOS);Wide (BOA);Head turns;Foam/compliant surface;5 reps    Standing Eyes Closed  Narrow base of support (BOS);Wide (BOA);Head turns;Foam/compliant surface;5 reps    Rockerboard  Anterior/posterior;Head turns;EO;10 reps    Partial Tandem Stance   Eyes open;Foam/compliant surface;5 reps with horizontal and vertical head turns    Other Standing Exercises  Performed marching on incline and on decline with EO and EC - with head turns side to side and vertical 5 reps each with CGA      Pt performed turning activity with cones - pt picked up one cone off floor while standing on blue mat - turned 180 degrees  to place in cabinet and then placed from cabinet back down on floor - SBA to CGA - 8 cones used - turning toward Rt and Lt sides  This activity increased vertigo - pt required rest period after completion of this activity to allow dizziness to decrease      PT Long Term Goals - 10/01/17 1108      PT LONG TERM GOAL #1   Title  Improve TUG score to </= 13.0 secs with cane for reduced fall risk.    Status  Achieved      PT LONG TERM GOAL #2   Title  Improve DVA to </= 2 line difference for improved gaze stabilization.    Baseline  2 line difference with c/o dizziness upon completion of testing - rates dizziness 3/10 -- 07-02-17; rates dizziness 2/10 after completion of test    Status  On-going    Target Date  10/29/17      PT LONG TERM GOAL #3   Title  Increase gait velocity from 2.52 to >/= 3.1 ft/sec with cane for incr. gait efficiency.    Baseline  10.19 secs = 3.22 ft/sec;  11.38 secs    13.19 secs on 10-01-17    Status  Achieved      PT LONG TERM GOAL #4   Title  Pt will report at least 25% improvement in vertigo.    Baseline  45% improvement reported on 06-04-17; pt reports increased vertigo experienced since right after Christmas - 10-01-17    Status  On-going    Target Date  10/29/17      PT LONG TERM GOAL #5   Title  Independent in HEP for balance and vestibular exercises.    Status  Achieved      PT LONG TERM GOAL #6   Title  Pt will report at least 50% improvement in vertigo.    Baseline  pt reports status varies depending on intensity of vertigo/balance -  08-13-17; pt states vertigo has stayed constant since  Christmas (2.5-3/10 - 6.5)    Status  On-going    Target Date  10/29/17      PT LONG TERM GOAL #7   Title  Pt will amb. 48' with horizontal head turns without moderate LOB with dizziness rating </= 3/10 intensity.    Baseline  dizziness 5/10 intensity  08-13-17;; dizziness rating 6/10 on 10-01-17    Status  On-going    Target Date  10/29/17      PT LONG TERM GOAL #8   Title  Amb. 300' modified independently without cane on flat,even surface without LOB.    Status  Achieved      PT LONG TERM GOAL  #9   TITLE  Pt will improve DHI score from 72% to </= 48% to demo improvement in vertigo. REVISED:  Improve DHI score from 84% on 10-01-17 to </ 60%      Baseline  72% 08-13-17;  84% on 10-01-17    Status  Revised    Target Date  10/29/17      PT LONG TERM GOAL  #10   TITLE  Improve Lt SLS to >/= 6 secs     Baseline  4.75 secs on LLE = 7.62 secs on LLE on 10-01-17    Status  Achieved            Plan - 10/19/17 2056    Clinical Impression Statement  Pt is progressing well towards goals with pt maintaining balance on compliant surfaces with EO and minimal LOB with EC on compliant surfaces. Pt c/o dizziness with activities with EC and with turns, requiring short rest breaks to allow dizziness to decrease.     Rehab Potential  Good    PT Frequency  2x / week    PT Duration  4 weeks    PT Treatment/Interventions  ADLs/Self Care Home Management;Gait training;Stair training;Therapeutic activities;Therapeutic exercise;Balance training;Neuromuscular re-education;Patient/family education;Vestibular    PT Next Visit Plan  cont balance/vestibular exercises    Consulted and Agree with Plan of Care  Patient       Patient will benefit from skilled therapeutic intervention in order to improve the following deficits and impairments:  Abnormal gait, Decreased balance, Difficulty walking, Dizziness  Visit Diagnosis: Unsteadiness on feet  Dizziness and giddiness     Problem List Patient Active  Problem List   Diagnosis Date Noted  . S/P partial resection of colon 04/10/2015  . Cirrhosis ([place]) 12/08/2014  . Esophageal reflux 12/08/2014  . Esophageal stricture 12/08/2014  . Vertigo 12/08/2014    Alda Lea, PT 10/19/2017, 9:03 PM  [facility] [address] [place], [place], [address] Phone: [phone]   Fax:  [phone]  Name: Brenda [name] MRN: [id number] Date of Birth: [birth year]

## 2017-10-20 ENCOUNTER — Ambulatory Visit: Payer: Medicare Other | Admitting: Physical Therapy

## 2017-10-20 DIAGNOSIS — R2681 Unsteadiness on feet: Secondary | ICD-10-CM

## 2017-10-20 DIAGNOSIS — R42 Dizziness and giddiness: Secondary | ICD-10-CM | POA: Diagnosis not present

## 2017-10-20 DIAGNOSIS — R2689 Other abnormalities of gait and mobility: Secondary | ICD-10-CM | POA: Diagnosis not present

## 2017-10-21 ENCOUNTER — Encounter: Payer: Self-pay | Admitting: Physical Therapy

## 2017-10-21 NOTE — Therapy (Signed)
[facility] [address] [place], [place], [address] Phone: [phone]   Fax:  [phone]  Physical Therapy Treatment  Patient Details  Name: Brenda [name] MRN: [id number] Date of Birth: Jan 11, 1946 Referring Provider: Colin Benton, DO   Encounter Date: 10/20/2017  PT End of Session - 10/21/17 1339    Visit Number  26    Number of Visits  31    Date for PT Re-Evaluation  10/29/17    Authorization Type  MCR    Authorization Time Period  06-04-17 - 08-03-17;  08-10-17 - 09-11-16:  09-10-17 - 10-11-17; 10-01-17 - 11-22-17    PT Start Time  1535    PT Stop Time  1618    PT Time Calculation (min)  43 min    Equipment Utilized During Treatment  Gait belt       Past Medical History:  Diagnosis Date  . Arthritis   . Bronchitis    hx of when smoked   . Cancer ([place]) 04/16/09   kidney cancer - [facility] per her report released from f/u  . Cataracts, bilateral   . Diverticulitis 11/2004  . Endometriosis 1994  . GERD (gastroesophageal reflux disease)    hx hiatal hernia, hx esophageal stricture s/p dilation  . Headache   . Hepatitis   . History of chicken pox   . History of hiatal hernia   . History of measles   . History of mumps as a child   . Hx of hepatitis    with cirrhosis - per her report from [place] and eval in 2013 at [place]  . Hyperlipidemia   . Left ear pain   . Migraines    pt states vesticular migraines has dizziness in relation   . Numbness and tingling    feet bilat has had for 40 years  . Right knee pain   . Scarlet fever   . Shingles   . Urinary tract bacterial infections    hx of  . Vertigo    associated with headache, intermittent, chronic    Past Surgical History:  Procedure Laterality Date  . ABDOMINAL HYSTERECTOMY  1994   TAH/BSO due to endometriosis  . APPENDECTOMY    . CATARACT EXTRACTION W/ INTRAOCULAR LENS IMPLANT Bilateral 05/2015, 04/2015  . DIAGNOSTIC LAPAROSCOPY     . LAPAROSCOPIC PARTIAL COLECTOMY N/A 04/10/2015   Procedure: LAPAROSCOPIC PARTIAL CECTOMY;  Surgeon: Ralene Ok, MD;  Location: [facility];  Service: General;  Laterality: N/A;  . LUNG BIOPSY  1996   Negative  . NEPHRECTOMY  04/16/09   Partial removal due to cancer  . OOPHORECTOMY    . TONSILLECTOMY    . TONSILLECTOMY      There were no vitals filed for this visit.  Subjective Assessment - 10/21/17 1334    Subjective  Pt reports dizziness at 2 1/2 intensity - having a good day - has been busy today - had dentist appt and even went to [place] and did well    Pertinent History  idiopathic peripheral neuropathy; pt was prescribed medication for migraines but states medication did not help; pt has sensorineural hearing loss (initally labyrinthitis? with sequelae?); h/o kidney cancer; chronic headaches    Patient Stated Goals  "would like to be comfortable enough to not have to use cane and be self sufficient"                      [facility] Adult PT  Treatment/Exercise - 10/21/17 0001      Ambulation/Gait   Ambulation/Gait  Yes    Ambulation/Gait Assistance  5: Supervision    Ambulation Distance (Feet)  200 Feet    Assistive device  None    Gait Pattern  Within Functional Limits    Ambulation Surface  Level;Indoor    Gait Comments  Pt amb. making circles with ball clockwise, counterclockwise and then "V" patern 50' x 2 reps each direction       Knee/Hip Exercises: Aerobic   Recumbent Bike  SciFit level 2.5 x 5" with UE's and LE's no charge as unsupervised          Balance Exercises - 10/21/17 1337      Balance Exercises: Standing   Standing Eyes Opened  Narrow base of support (BOS);Wide (BOA);Head turns;Foam/compliant surface;5 reps    Standing Eyes Closed  Narrow base of support (BOS);Wide (BOA);Head turns;Foam/compliant surface;5 reps    Rockerboard  Anterior/posterior;Head turns;EO;10 reps    Balance Beam  standing on blue alance beam with EO and EC; alternate UE  flexion for core stabilization     Partial Tandem Stance  Eyes open;Foam/compliant surface;5 reps with horizontal and vertical head turns    Other Standing Exercises  Pt performed marching on blue mat on floor - with EO and EC with horiozntal and vertical head turns 5 reps each; partial tandem stance on mat with EO and EC with head turns side to side and up/down 5 reps each              PT Long Term Goals - 10/01/17 1108      PT LONG TERM GOAL #1   Title  Improve TUG score to </= 13.0 secs with cane for reduced fall risk.    Status  Achieved      PT LONG TERM GOAL #2   Title  Improve DVA to </= 2 line difference for improved gaze stabilization.    Baseline  2 line difference with c/o dizziness upon completion of testing - rates dizziness 3/10 -- 07-02-17; rates dizziness 2/10 after completion of test    Status  On-going    Target Date  10/29/17      PT LONG TERM GOAL #3   Title  Increase gait velocity from 2.52 to >/= 3.1 ft/sec with cane for incr. gait efficiency.    Baseline  10.19 secs = 3.22 ft/sec;  11.38 secs    13.19 secs on 10-01-17    Status  Achieved      PT LONG TERM GOAL #4   Title  Pt will report at least 25% improvement in vertigo.    Baseline  45% improvement reported on 06-04-17; pt reports increased vertigo experienced since right after Christmas - 10-01-17    Status  On-going    Target Date  10/29/17      PT LONG TERM GOAL #5   Title  Independent in HEP for balance and vestibular exercises.    Status  Achieved      PT LONG TERM GOAL #6   Title  Pt will report at least 50% improvement in vertigo.    Baseline  pt reports status varies depending on intensity of vertigo/balance - 08-13-17; pt states vertigo has stayed constant since Christmas (2.5-3/10 - 6.5)    Status  On-going    Target Date  10/29/17      PT LONG TERM GOAL #7   Title  Pt will amb. 21' with horizontal head turns  without moderate LOB with dizziness rating </= 3/10 intensity.    Baseline   dizziness 5/10 intensity  08-13-17;; dizziness rating 6/10 on 10-01-17    Status  On-going    Target Date  10/29/17      PT LONG TERM GOAL #8   Title  Amb. 300' modified independently without cane on flat,even surface without LOB.    Status  Achieved      PT LONG TERM GOAL  #9   TITLE  Pt will improve DHI score from 72% to </= 48% to demo improvement in vertigo. REVISED:  Improve DHI score from 84% on 10-01-17 to </ 60%      Baseline  72% 08-13-17;  84% on 10-01-17    Status  Revised    Target Date  10/29/17      PT LONG TERM GOAL  #10   TITLE  Improve Lt SLS to >/= 6 secs     Baseline  4.75 secs on LLE = 7.62 secs on LLE on 10-01-17    Status  Achieved            Plan - 10/21/17 1340    Clinical Impression Statement  Pt demonstrating signficant improvements in balance and gait today- as she reported she was having a "good day":  pt c/o dizziness and pressure in her head with the vestibular stimulation/balance exercises but was able to continue to perform exercises without LOB.    Rehab Potential  Good    PT Frequency  2x / week    PT Duration  4 weeks    PT Treatment/Interventions  ADLs/Self Care Home Management;Gait training;Stair training;Therapeutic activities;Therapeutic exercise;Balance training;Neuromuscular re-education;Patient/family education;Vestibular    PT Next Visit Plan  cont balance/vestibular exercises - check if pt performed x1 viewing exercise in the am (results?)    PT Home Exercise Plan  x1 viewing; balance on foam added 05-19-17; added heel lifts and heel cord stretch on 06-04-17    Consulted and Agree with Plan of Care  Patient       Patient will benefit from skilled therapeutic intervention in order to improve the following deficits and impairments:  Abnormal gait, Decreased balance, Difficulty walking, Dizziness  Visit Diagnosis: Unsteadiness on feet  Dizziness and giddiness     Problem List Patient Active Problem List   Diagnosis Date Noted  . S/P  partial resection of colon 04/10/2015  . Cirrhosis ([place]) 12/08/2014  . Esophageal reflux 12/08/2014  . Esophageal stricture 12/08/2014  . Vertigo 12/08/2014    Alda Lea, PT 10/21/2017, 1:44 PM  [facility] [address] [place], [place], [address] Phone: [phone]   Fax:  [phone]  Name: Brenda [name] MRN: [id number] Date of Birth: 1946-04-13

## 2017-10-26 ENCOUNTER — Ambulatory Visit: Payer: Medicare Other | Attending: Family Medicine | Admitting: Physical Therapy

## 2017-10-26 DIAGNOSIS — R2681 Unsteadiness on feet: Secondary | ICD-10-CM | POA: Insufficient documentation

## 2017-10-26 DIAGNOSIS — R2689 Other abnormalities of gait and mobility: Secondary | ICD-10-CM | POA: Diagnosis not present

## 2017-10-26 DIAGNOSIS — R42 Dizziness and giddiness: Secondary | ICD-10-CM | POA: Insufficient documentation

## 2017-10-27 ENCOUNTER — Encounter: Payer: Self-pay | Admitting: Physical Therapy

## 2017-10-27 ENCOUNTER — Ambulatory Visit: Payer: Medicare Other | Admitting: Physical Therapy

## 2017-10-27 DIAGNOSIS — R42 Dizziness and giddiness: Secondary | ICD-10-CM

## 2017-10-27 DIAGNOSIS — R2689 Other abnormalities of gait and mobility: Secondary | ICD-10-CM | POA: Diagnosis not present

## 2017-10-27 DIAGNOSIS — R2681 Unsteadiness on feet: Secondary | ICD-10-CM

## 2017-10-27 NOTE — Therapy (Signed)
[facility] [address] [place], [place], [address] Phone: [phone]   Fax:  [phone]  Physical Therapy Treatment  Patient Details  Name: Brenda [name] MRN: [id number] Date of Birth: 1946-08-10 Referring Provider: Colin Benton, DO   Encounter Date: 10/26/2017  PT End of Session - 10/27/17 1254    Visit Number  27    Number of Visits  31    Date for PT Re-Evaluation  10/29/17    Authorization Type  MCR    Authorization Time Period  06-04-17 - 08-03-17;  08-10-17 - 09-11-16:  09-10-17 - 10-11-17; 10-01-17 - 11-22-17    PT Start Time  1103    PT Stop Time  1147    PT Time Calculation (min)  44 min    Equipment Utilized During Treatment  Gait belt       Past Medical History:  Diagnosis Date  . Arthritis   . Bronchitis    hx of when smoked   . Cancer ([place]) 04/16/09   kidney cancer - [facility] per her report released from f/u  . Cataracts, bilateral   . Diverticulitis 11/2004  . Endometriosis 1994  . GERD (gastroesophageal reflux disease)    hx hiatal hernia, hx esophageal stricture s/p dilation  . Headache   . Hepatitis   . History of chicken pox   . History of hiatal hernia   . History of measles   . History of mumps as a child   . Hx of hepatitis    with cirrhosis - per her report from [place] and eval in 2013 at [facility]  . Hyperlipidemia   . Left ear pain   . Migraines    pt states vesticular migraines has dizziness in relation   . Numbness and tingling    feet bilat has had for 40 years  . Right knee pain   . Scarlet fever   . Shingles   . Urinary tract bacterial infections    hx of  . Vertigo    associated with headache, intermittent, chronic    Past Surgical History:  Procedure Laterality Date  . ABDOMINAL HYSTERECTOMY  1994   TAH/BSO due to endometriosis  . APPENDECTOMY    . CATARACT EXTRACTION W/ INTRAOCULAR LENS IMPLANT Bilateral 05/2015, 04/2015  . DIAGNOSTIC LAPAROSCOPY     . LAPAROSCOPIC PARTIAL COLECTOMY N/A 04/10/2015   Procedure: LAPAROSCOPIC PARTIAL CECTOMY;  Surgeon: Ralene Ok, MD;  Location: [facility];  Service: General;  Laterality: N/A;  . LUNG BIOPSY  1996   Negative  . NEPHRECTOMY  04/16/09   Partial removal due to cancer  . OOPHORECTOMY    . TONSILLECTOMY    . TONSILLECTOMY      There were no vitals filed for this visit.  Subjective Assessment - 10/27/17 1249    Subjective  Pt reports dizziness is about a 5/10 today - got dizzy walking across the parking lot to come into clinic    Pertinent History  idiopathic peripheral neuropathy; pt was prescribed medication for migraines but states medication did not help; pt has sensorineural hearing loss (initally labyrinthitis? with sequelae?); h/o kidney cancer; chronic headaches    Patient Stated Goals  "would like to be comfortable enough to not have to use cane and be self sufficient"    Currently in Pain?  Other (Comment)                      [facility] Adult PT  Treatment/Exercise - 10/27/17 0001      Ambulation/Gait   Ambulation/Gait  Yes    Ambulation/Gait Assistance  5: Supervision    Ambulation Distance (Feet)  250 Feet    Assistive device  None    Gait Pattern  Within Functional Limits    Ambulation Surface  Level;Indoor    Gait Comments  Pt amb. making circles clockwise and counterclockwise with ball for improved VOR/gaze stabilization      High Level Balance   High Level Balance Activities  Backward walking;Direction changes;Sudden stops;Head turns;Marching forwards      Knee/Hip Exercises: Aerobic   Recumbent Bike  SciFit level 2.5 x 13" with UE's and LE's no charge as unsupervised          Balance Exercises - 10/27/17 1253      Balance Exercises: Standing   Standing Eyes Opened  Narrow base of support (BOS);Wide (BOA);Head turns;Foam/compliant surface;5 reps    Standing Eyes Closed  Narrow base of support (BOS);Wide (BOA);Head turns;Foam/compliant surface;5 reps     Rockerboard  Anterior/posterior;Head turns;EO;10 reps    Balance Beam  standing on blue balance beam in tandem and perpendicular stance with UE support prn inside // bars with head turns with EO and EC             PT Long Term Goals - 10/01/17 1108      PT LONG TERM GOAL #1   Title  Improve TUG score to </= 13.0 secs with cane for reduced fall risk.    Status  Achieved      PT LONG TERM GOAL #2   Title  Improve DVA to </= 2 line difference for improved gaze stabilization.    Baseline  2 line difference with c/o dizziness upon completion of testing - rates dizziness 3/10 -- 07-02-17; rates dizziness 2/10 after completion of test    Status  On-going    Target Date  10/29/17      PT LONG TERM GOAL #3   Title  Increase gait velocity from 2.52 to >/= 3.1 ft/sec with cane for incr. gait efficiency.    Baseline  10.19 secs = 3.22 ft/sec;  11.38 secs    13.19 secs on 10-01-17    Status  Achieved      PT LONG TERM GOAL #4   Title  Pt will report at least 25% improvement in vertigo.    Baseline  45% improvement reported on 06-04-17; pt reports increased vertigo experienced since right after Christmas - 10-01-17    Status  On-going    Target Date  10/29/17      PT LONG TERM GOAL #5   Title  Independent in HEP for balance and vestibular exercises.    Status  Achieved      PT LONG TERM GOAL #6   Title  Pt will report at least 50% improvement in vertigo.    Baseline  pt reports status varies depending on intensity of vertigo/balance - 08-13-17; pt states vertigo has stayed constant since Christmas (2.5-3/10 - 6.5)    Status  On-going    Target Date  10/29/17      PT LONG TERM GOAL #7   Title  Pt will amb. 66' with horizontal head turns without moderate LOB with dizziness rating </= 3/10 intensity.    Baseline  dizziness 5/10 intensity  08-13-17;; dizziness rating 6/10 on 10-01-17    Status  On-going    Target Date  10/29/17      PT LONG  TERM GOAL #8   Title  Amb. 300' modified  independently without cane on flat,even surface without LOB.    Status  Achieved      PT LONG TERM GOAL  #9   TITLE  Pt will improve DHI score from 72% to </= 48% to demo improvement in vertigo. REVISED:  Improve DHI score from 84% on 10-01-17 to </ 60%      Baseline  72% 08-13-17;  84% on 10-01-17    Status  Revised    Target Date  10/29/17      PT LONG TERM GOAL  #10   TITLE  Improve Lt SLS to >/= 6 secs     Baseline  4.75 secs on LLE = 7.62 secs on LLE on 10-01-17    Status  Achieved            Plan - 10/27/17 1255    Clinical Impression Statement  Pt reporting increased vertigo today with unknown reason why vertigo intensity would be increased.  Pt able to perform vestibular actvities with moderate c/o vertigo and required frequent short seated rest periods to allow vertigo to decrease at times.    Rehab Potential  Good    PT Frequency  2x / week    PT Duration  4 weeks    PT Treatment/Interventions  ADLs/Self Care Home Management;Gait training;Stair training;Therapeutic activities;Therapeutic exercise;Balance training;Neuromuscular re-education;Patient/family education;Vestibular    PT Next Visit Plan  cont balance/vestibular exercises - check if pt performed x1 viewing exercise in the am (results?)    PT Home Exercise Plan  x1 viewing; balance on foam added 05-19-17; added heel lifts and heel cord stretch on 06-04-17    Consulted and Agree with Plan of Care  Patient       Patient will benefit from skilled therapeutic intervention in order to improve the following deficits and impairments:  Abnormal gait, Decreased balance, Difficulty walking, Dizziness  Visit Diagnosis: Unsteadiness on feet  Dizziness and giddiness     Problem List Patient Active Problem List   Diagnosis Date Noted  . S/P partial resection of colon 04/10/2015  . Cirrhosis ([place]) 12/08/2014  . Esophageal reflux 12/08/2014  . Esophageal stricture 12/08/2014  . Vertigo 12/08/2014    Alda Lea, PT 10/27/2017, 1:01 PM  [facility] [address] [place], [place], [address] Phone: [phone]   Fax:  [phone]  Name: Brenda [name] MRN: [id number] Date of Birth: 1945/09/05

## 2017-10-28 ENCOUNTER — Encounter: Payer: Self-pay | Admitting: Physical Therapy

## 2017-10-28 NOTE — Therapy (Signed)
[facility] [address] [place], [place], [address] Phone: [phone]   Fax:  [phone]  Physical Therapy Treatment  Patient Details  Name: Brenda [name] MRN: [id number] Date of Birth: [birth year] Referring Provider: Colin Benton, DO   Encounter Date: 10/27/2017  PT End of Session - 10/28/17 1410    Visit Number  28    Number of Visits  31    Date for PT Re-Evaluation  11/22/17    Authorization Type  MCR    Authorization Time Period  06-04-17 - 08-03-17;  08-10-17 - 09-11-16:  09-10-17 - 10-11-17; 10-01-17 - 11-22-17    PT Start Time  1018    PT Stop Time  1104    PT Time Calculation (min)  46 min    Equipment Utilized During Treatment  Gait belt       Past Medical History:  Diagnosis Date  . Arthritis   . Bronchitis    hx of when smoked   . Cancer ([place]) 04/16/09   kidney cancer - tx by [facility] per her report released from f/u  . Cataracts, bilateral   . Diverticulitis 11/2004  . Endometriosis 1994  . GERD (gastroesophageal reflux disease)    hx hiatal hernia, hx esophageal stricture s/p dilation  . Headache   . Hepatitis   . History of chicken pox   . History of hiatal hernia   . History of measles   . History of mumps as a child   . Hx of hepatitis    with cirrhosis - per her report from [place] and eval in 2013 at [facility]  . Hyperlipidemia   . Left ear pain   . Migraines    pt states vesticular migraines has dizziness in relation   . Numbness and tingling    feet bilat has had for 40 years  . Right knee pain   . Scarlet fever   . Shingles   . Urinary tract bacterial infections    hx of  . Vertigo    associated with headache, intermittent, chronic    Past Surgical History:  Procedure Laterality Date  . ABDOMINAL HYSTERECTOMY  1994   TAH/BSO due to endometriosis  . APPENDECTOMY    . CATARACT EXTRACTION W/ INTRAOCULAR LENS IMPLANT Bilateral 05/2015, 04/2015  . DIAGNOSTIC LAPAROSCOPY     . LAPAROSCOPIC PARTIAL COLECTOMY N/A 04/10/2015   Procedure: LAPAROSCOPIC PARTIAL CECTOMY;  Surgeon: Ralene Ok, MD;  Location: [facility];  Service: General;  Laterality: N/A;  . LUNG BIOPSY  1996   Negative  . NEPHRECTOMY  04/16/09   Partial removal due to cancer  . OOPHORECTOMY    . TONSILLECTOMY    . TONSILLECTOMY      There were no vitals filed for this visit.  Subjective Assessment - 10/28/17 1407    Subjective  Pt reports dizziness is 4/10 today - a little better than it was on Monday    Pertinent History  idiopathic peripheral neuropathy; pt was prescribed medication for migraines but states medication did not help; pt has sensorineural hearing loss (initally labyrinthitis? with sequelae?); h/o kidney cancer; chronic headaches    Patient Stated Goals  "would like to be comfortable enough to not have to use cane and be self sufficient"    Currently in Pain?  Other (Comment) headache (typical)                      [facility] Adult PT Treatment/Exercise - 10/28/17  0001      Ambulation/Gait   Ambulation/Gait  Yes    Ambulation/Gait Assistance  5: Supervision    Ambulation Distance (Feet)  275 Feet    Assistive device  None    Gait Pattern  Within Functional Limits    Ambulation Surface  Level;Indoor    Gait Comments  Pt amb. making circles clockwise and counterclockwise with ball for improved VOR/gaze stabilization also making letter "V" with ball for improved gaze stabiliza          Balance Exercises - 10/28/17 1409      Balance Exercises: Standing   Standing Eyes Opened  Narrow base of support (BOS);Wide (BOA);Head turns;Foam/compliant surface;5 reps    Standing Eyes Closed  Narrow base of support (BOS);Wide (BOA);Head turns;Foam/compliant surface;5 reps    SLS with Vectors  Foam/compliant surface;5 reps on Bosu inside // bars    Rockerboard  Anterior/posterior;Head turns;EO;10 reps    Other Standing Exercises  Pt performed marching on blue mat on floor -  with EO and EC with horiozntal and vertical head turns 5 reps each; partial tandem stance on mat with EO and EC with head turns side to side and up/down 5 reps each        Standing on blue mat - cones on floor - bending down to pick up cone off floor and turning 180 degrees to place cone on  Cabinet shelf - 8 cones total - 2 reps with turns toward Lt and Rt sides incorporated with this activity      PT Long Term Goals - 10/28/17 1414      PT LONG TERM GOAL #2   Title  Improve DVA to </= 2 line difference for improved gaze stabilization.    Baseline  2 line difference with c/o dizziness upon completion of testing - rates dizziness 3/10 -- 07-02-17; rates dizziness 2/10 after completion of test    Time  4    Period  Weeks    Status  On-going    Target Date  11/22/17      PT LONG TERM GOAL #4   Title  Pt will report at least 25% improvement in vertigo.    Baseline  45% improvement reported on 06-04-17; pt reports increased vertigo experienced since right after Christmas - 10-01-17    Time  4    Period  Weeks    Status  On-going    Target Date  11/22/17      PT LONG TERM GOAL #5   Title  Independent in HEP for balance and vestibular exercises.    Time  4    Period  Weeks    Status  Achieved      PT LONG TERM GOAL #6   Title  Pt will report at least 50% improvement in vertigo.    Time  4    Period  Weeks    Status  On-going    Target Date  11/22/17      PT LONG TERM GOAL #7   Title  Pt will amb. 74' with horizontal head turns without moderate LOB with dizziness rating </= 3/10 intensity.    Baseline  dizziness 5/10 intensity  08-13-17;; dizziness rating 6/10 on 10-01-17    Time  4    Period  Weeks    Status  On-going    Target Date  11/22/17      PT LONG TERM GOAL  #9   TITLE  Pt will improve DHI score from 72%  to </= 48% to demo improvement in vertigo. REVISED:  Improve DHI score from 84% on 10-01-17 to </ 60%      Baseline  72% 08-13-17;  84% on 10-01-17    Time  4    Period   Weeks    Status  On-going    Target Date  11/22/17            Plan - 10/28/17 1411    Clinical Impression Statement  Pt progressing well towards goals; continues to report increased vertigo with activities with EC, especially standing on compliant surfaces, indicative of vestibular hypofunction.     Rehab Potential  Good    PT Frequency  2x / week    PT Duration  4 weeks    PT Treatment/Interventions  ADLs/Self Care Home Management;Gait training;Stair training;Therapeutic activities;Therapeutic exercise;Balance training;Neuromuscular re-education;Patient/family education;Vestibular    PT Next Visit Plan  cont balance/vestibular exercises     PT Home Exercise Plan  x1 viewing; balance on foam added 05-19-17; added heel lifts and heel cord stretch on 06-04-17    Consulted and Agree with Plan of Care  Patient       Patient will benefit from skilled therapeutic intervention in order to improve the following deficits and impairments:  Abnormal gait, Decreased balance, Difficulty walking, Dizziness  Visit Diagnosis: Other abnormalities of gait and mobility  Unsteadiness on feet  Dizziness and giddiness     Problem List Patient Active Problem List   Diagnosis Date Noted  . S/P partial resection of colon 04/10/2015  . Cirrhosis ([place]) 12/08/2014  . Esophageal reflux 12/08/2014  . Esophageal stricture 12/08/2014  . Vertigo 12/08/2014    D[place]ness [place], PT 10/28/2017, 2:18 PM  [facility] [address] [place], [place], [address] Phone: [phone]   Fax:  [phone]  Name: Brenda [name] MRN: [id number] Date of Birth: 1945-08-31

## 2017-11-02 ENCOUNTER — Ambulatory Visit: Payer: Medicare Other | Admitting: Physical Therapy

## 2017-11-02 DIAGNOSIS — R2681 Unsteadiness on feet: Secondary | ICD-10-CM

## 2017-11-02 DIAGNOSIS — R2689 Other abnormalities of gait and mobility: Secondary | ICD-10-CM | POA: Diagnosis not present

## 2017-11-02 DIAGNOSIS — R42 Dizziness and giddiness: Secondary | ICD-10-CM | POA: Diagnosis not present

## 2017-11-03 ENCOUNTER — Encounter: Payer: Self-pay | Admitting: Physical Therapy

## 2017-11-03 NOTE — Therapy (Signed)
[facility] [address] [place], [place], [address] Phone: [phone]   Fax:  [phone]  Physical Therapy Treatment  Patient Details  Name: Brenda [name] MRN: [id number] Date of Birth: 11/01/1945 Referring Provider: Colin Benton, DO   Encounter Date: 11/02/2017  PT End of Session - 11/03/17 1708    Visit Number  29    Number of Visits  31    Date for PT Re-Evaluation  11/22/17    Authorization Type  MCR    Authorization Time Period  06-04-17 - 08-03-17;  08-10-17 - 09-11-16:  09-10-17 - 10-11-17; 10-01-17 - 11-22-17    PT Start Time  1104    PT Stop Time  1150    PT Time Calculation (min)  46 min    Equipment Utilized During Treatment  Gait belt       Past Medical History:  Diagnosis Date  . Arthritis   . Bronchitis    hx of when smoked   . Cancer ([place]) 04/16/09   kidney cancer - [facility] per her report released from f/u  . Cataracts, bilateral   . Diverticulitis 11/2004  . Endometriosis 1994  . GERD (gastroesophageal reflux disease)    hx hiatal hernia, hx esophageal stricture s/p dilation  . Headache   . Hepatitis   . History of chicken pox   . History of hiatal hernia   . History of measles   . History of mumps as a child   . Hx of hepatitis    with cirrhosis - per her report from [place] and eval in 2013 at [facility]  . Hyperlipidemia   . Left ear pain   . Migraines    pt states vesticular migraines has dizziness in relation   . Numbness and tingling    feet bilat has had for 40 years  . Right knee pain   . Scarlet fever   . Shingles   . Urinary tract bacterial infections    hx of  . Vertigo    associated with headache, intermittent, chronic    Past Surgical History:  Procedure Laterality Date  . ABDOMINAL HYSTERECTOMY  1994   TAH/BSO due to endometriosis  . APPENDECTOMY    . CATARACT EXTRACTION W/ INTRAOCULAR LENS IMPLANT Bilateral 05/2015, 04/2015  . DIAGNOSTIC LAPAROSCOPY     . LAPAROSCOPIC PARTIAL COLECTOMY N/A 04/10/2015   Procedure: LAPAROSCOPIC PARTIAL CECTOMY;  Surgeon: Ralene Ok, MD;  Location: [facility];  Service: General;  Laterality: N/A;  . LUNG BIOPSY  1996   Negative  . NEPHRECTOMY  04/16/09   Partial removal due to cancer  . OOPHORECTOMY    . TONSILLECTOMY    . TONSILLECTOMY      There were no vitals filed for this visit.  Subjective Assessment - 11/03/17 1701    Subjective  Pt reports dizziness 3/10 at present time    Pertinent History  idiopathic peripheral neuropathy; pt was prescribed medication for migraines but states medication did not help; pt has sensorineural hearing loss (initally labyrinthitis? with sequelae?); h/o kidney cancer; chronic headaches    Patient Stated Goals  "would like to be comfortable enough to not have to use cane and be self sufficient"    Currently in Pain?  No/denies                      [facility] Adult PT Treatment/Exercise - 11/03/17 0001      Ambulation/Gait   Ambulation/Gait  Yes    Ambulation/Gait Assistance  5: Supervision    Ambulation Distance (Feet)  200 Feet    Assistive device  None    Gait Pattern  Within Functional Limits    Ambulation Surface  Level;Indoor    Gait Comments  Pt amb. making circles clockwise and counterclockwise with ball for improved VOR/gaze stabilization also making letter "V" with ball for improved gaze stabiliza      High Level Balance   High Level Balance Activities  Backward walking;Sudden stops;Turns;Head turns          Balance Exercises - 11/03/17 1702      Balance Exercises: Standing   Standing Eyes Opened  Narrow base of support (BOS);Wide (BOA);Head turns;Foam/compliant surface;5 reps    Standing Eyes Closed  Narrow base of support (BOS);Wide (BOA);Head turns;Foam/compliant surface;5 reps    SLS with Vectors  Foam/compliant surface    Rockerboard  Anterior/posterior;Head turns;EO;10 reps    Partial Tandem Stance  Eyes open;Foam/compliant  surface;5 reps with horizontal and vertical head turns             PT Long Term Goals - 10/28/17 1414      PT LONG TERM GOAL #2   Title  Improve DVA to </= 2 line difference for improved gaze stabilization.    Baseline  2 line difference with c/o dizziness upon completion of testing - rates dizziness 3/10 -- 07-02-17; rates dizziness 2/10 after completion of test    Time  4    Period  Weeks    Status  On-going    Target Date  11/22/17      PT LONG TERM GOAL #4   Title  Pt will report at least 25% improvement in vertigo.    Baseline  45% improvement reported on 06-04-17; pt reports increased vertigo experienced since right after Christmas - 10-01-17    Time  4    Period  Weeks    Status  On-going    Target Date  11/22/17      PT LONG TERM GOAL #5   Title  Independent in HEP for balance and vestibular exercises.    Time  4    Period  Weeks    Status  Achieved      PT LONG TERM GOAL #6   Title  Pt will report at least 50% improvement in vertigo.    Time  4    Period  Weeks    Status  On-going    Target Date  11/22/17      PT LONG TERM GOAL #7   Title  Pt will amb. 61' with horizontal head turns without moderate LOB with dizziness rating </= 3/10 intensity.    Baseline  dizziness 5/10 intensity  08-13-17;; dizziness rating 6/10 on 10-01-17    Time  4    Period  Weeks    Status  On-going    Target Date  11/22/17      PT LONG TERM GOAL  #9   TITLE  Pt will improve DHI score from 72% to </= 48% to demo improvement in vertigo. REVISED:  Improve DHI score from 84% on 10-01-17 to </ 60%      Baseline  72% 08-13-17;  84% on 10-01-17    Time  4    Period  Weeks    Status  On-going    Target Date  11/22/17            Plan - 11/03/17 1710  Clinical Impression Statement  Pt progressing towards goals; she continues to c/o dizziness and pressure in head with vestibular activities but is able to perform without LOB    Rehab Potential  Good    PT Frequency  2x / week    PT  Duration  4 weeks    PT Treatment/Interventions  ADLs/Self Care Home Management;Gait training;Stair training;Therapeutic activities;Therapeutic exercise;Balance training;Neuromuscular re-education;Patient/family education;Vestibular    PT Next Visit Plan  cont balance/vestibular exercises     PT Home Exercise Plan  x1 viewing; balance on foam added 05-19-17; added heel lifts and heel cord stretch on 06-04-17    Consulted and Agree with Plan of Care  Patient       Patient will benefit from skilled therapeutic intervention in order to improve the following deficits and impairments:  Abnormal gait, Decreased balance, Difficulty walking, Dizziness  Visit Diagnosis: Other abnormalities of gait and mobility  Dizziness and giddiness  Unsteadiness on feet     Problem List Patient Active Problem List   Diagnosis Date Noted  . S/P partial resection of colon 04/10/2015  . Cirrhosis ([place]) 12/08/2014  . Esophageal reflux 12/08/2014  . Esophageal stricture 12/08/2014  . Vertigo 12/08/2014    Alda Lea, PT 11/03/2017, 5:18 PM  [facility] [address] [place], [place], [address] Phone: [phone]   Fax:  [phone]  Name: Brenda [name] MRN: [id number] Date of Birth: 11/09/1945

## 2017-11-05 ENCOUNTER — Encounter: Payer: Self-pay | Admitting: Physical Therapy

## 2017-11-05 ENCOUNTER — Ambulatory Visit: Payer: Medicare Other | Admitting: Physical Therapy

## 2017-11-05 DIAGNOSIS — R42 Dizziness and giddiness: Secondary | ICD-10-CM

## 2017-11-05 DIAGNOSIS — R2681 Unsteadiness on feet: Secondary | ICD-10-CM | POA: Diagnosis not present

## 2017-11-05 DIAGNOSIS — R2689 Other abnormalities of gait and mobility: Secondary | ICD-10-CM | POA: Diagnosis not present

## 2017-11-06 ENCOUNTER — Encounter: Payer: Self-pay | Admitting: Physical Therapy

## 2017-11-06 DIAGNOSIS — G629 Polyneuropathy, unspecified: Secondary | ICD-10-CM | POA: Diagnosis not present

## 2017-11-06 DIAGNOSIS — R42 Dizziness and giddiness: Secondary | ICD-10-CM | POA: Diagnosis not present

## 2017-11-06 DIAGNOSIS — Z79899 Other long term (current) drug therapy: Secondary | ICD-10-CM | POA: Diagnosis not present

## 2017-11-06 NOTE — Therapy (Signed)
[facility] [address] [place], [place], [address] Phone: [phone]   Fax:  [phone]  Physical Therapy Treatment  Patient Details  Name: Brenda [name] MRN: [id number] Date of Birth: [birth year] Referring Provider: Colin Benton, DO   Encounter Date: 11/05/2017  PT End of Session - 11/06/17 1516    Visit Number  30    Number of Visits  31    Date for PT Re-Evaluation  11/22/17    Authorization Type  MCR    Authorization Time Period  06-04-17 - 08-03-17;  08-10-17 - 09-11-16:  09-10-17 - 10-11-17; 10-01-17 - 11-22-17    PT Start Time  1018    PT Stop Time  1103    PT Time Calculation (min)  45 min       Past Medical History:  Diagnosis Date  . Arthritis   . Bronchitis    hx of when smoked   . Cancer ([place]) 04/16/09   kidney cancer - [facility] per her report released from f/u  . Cataracts, bilateral   . Diverticulitis 11/2004  . Endometriosis 1994  . GERD (gastroesophageal reflux disease)    hx hiatal hernia, hx esophageal stricture s/p dilation  . Headache   . Hepatitis   . History of chicken pox   . History of hiatal hernia   . History of measles   . History of mumps as a child   . Hx of hepatitis    with cirrhosis - per her report from [place] and eval in 2013 at [facility]  . Hyperlipidemia   . Left ear pain   . Migraines    pt states vesticular migraines has dizziness in relation   . Numbness and tingling    feet bilat has had for 40 years  . Right knee pain   . Scarlet fever   . Shingles   . Urinary tract bacterial infections    hx of  . Vertigo    associated with headache, intermittent, chronic    Past Surgical History:  Procedure Laterality Date  . ABDOMINAL HYSTERECTOMY  1994   TAH/BSO due to endometriosis  . APPENDECTOMY    . CATARACT EXTRACTION W/ INTRAOCULAR LENS IMPLANT Bilateral 05/2015, 04/2015  . DIAGNOSTIC LAPAROSCOPY    . LAPAROSCOPIC PARTIAL COLECTOMY N/A 04/10/2015    Procedure: LAPAROSCOPIC PARTIAL CECTOMY;  Surgeon: Ralene Ok, MD;  Location: [facility];  Service: General;  Laterality: N/A;  . LUNG BIOPSY  1996   Negative  . NEPHRECTOMY  04/16/09   Partial removal due to cancer  . OOPHORECTOMY    . TONSILLECTOMY    . TONSILLECTOMY      There were no vitals filed for this visit.  Subjective Assessment - 11/06/17 1505    Subjective  Pt states she got dizzy with bending over and turning to take files downstairs; baseline 2/10 dizziness;     Pertinent History  idiopathic peripheral neuropathy; pt was prescribed medication for migraines but states medication did not help; pt has sensorineural hearing loss (initally labyrinthitis? with sequelae?); h/o kidney cancer; chronic headaches    Patient Stated Goals  "would like to be comfortable enough to not have to use cane and be self sufficient"    Currently in Pain?  No/denies                      [facility] Adult PT Treatment/Exercise - 11/06/17 0001      Ambulation/Gait   Ambulation/Gait  Yes    Ambulation/Gait Assistance  5: Supervision    Ambulation Distance (Feet)  300 Feet    Assistive device  None    Gait Pattern  Within Functional Limits    Ambulation Surface  Level;Indoor    Gait Comments  Pt amb. making circles clockwise and counterclockwise with ball for improved VOR/gaze stabilization also making letter "V" with ball for improved gaze stabiliza      Neuro Re-ed    Neuro Re-ed Details   Pt performed habituation exericses on blue mat for compliant surface training in combination for habituation of vertigo; crossovers 1 rep each leg with 180 degree turn, crossover , 180 degree turn for 6 reps ;  pt performed diagonal reaching - standing on blue mat reaching down toward Rt /Lt foot and then turning to look over opposite shoulder with return to upright - 5 reps eahc direction;  pt performed picking up 1 cone off floor, turnign 180 degrees to place on cabinet shelf behind her - 2 reps from  floor to cabinet , back down to floor and back to shelf;  pt reported increased vertigo with this activity      Knee/Hip Exercises: Aerobic   Recumbent Bike  SciFit level 3.0 x 15" with UE's and LE's no charge as unsupervised         Probation officer;Head turns;EO;10 reps   Other Standing Exercises  Pt performed marching on blue mat on floor - with EO and EC with horiozntal and vertical head turns 5 reps each; partial tandem stance on mat with EO and EC with head turns side to side and up/down 5 reps each              PT Long Term Goals - 10/28/17 1414      PT LONG TERM GOAL #2   Title  Improve DVA to </= 2 line difference for improved gaze stabilization.    Baseline  2 line difference with c/o dizziness upon completion of testing - rates dizziness 3/10 -- 07-02-17; rates dizziness 2/10 after completion of test    Time  4    Period  Weeks    Status  On-going    Target Date  11/22/17      PT LONG TERM GOAL #4   Title  Pt will report at least 25% improvement in vertigo.    Baseline  45% improvement reported on 06-04-17; pt reports increased vertigo experienced since right after Christmas - 10-01-17    Time  4    Period  Weeks    Status  On-going    Target Date  11/22/17      PT LONG TERM GOAL #5   Title  Independent in HEP for balance and vestibular exercises.    Time  4    Period  Weeks    Status  Achieved      PT LONG TERM GOAL #6   Title  Pt will report at least 50% improvement in vertigo.    Time  4    Period  Weeks    Status  On-going    Target Date  11/22/17      PT LONG TERM GOAL #7   Title  Pt will amb. 40' with horizontal head turns without moderate LOB with dizziness rating </= 3/10 intensity.    Baseline  dizziness 5/10 intensity  08-13-17;; dizziness rating 6/10 on 10-01-17    Time  4    Period  Weeks  Status  On-going    Target Date  11/22/17      PT LONG TERM GOAL  #9   TITLE  Pt will improve DHI score from 72% to </= 48% to demo  improvement in vertigo. REVISED:  Improve DHI score from 84% on 10-01-17 to </ 60%      Baseline  72% 08-13-17;  84% on 10-01-17    Time  4    Period  Weeks    Status  On-going    Target Date  11/22/17            Plan - 11/06/17 1518    Clinical Impression Statement  Pt continues to progress towards goals - continues to c/o incr. vertigo with turns and with exercises with eyes closed    Rehab Potential  Good    PT Frequency  2x / week    PT Duration  4 weeks    PT Treatment/Interventions  ADLs/Self Care Home Management;Gait training;Stair training;Therapeutic activities;Therapeutic exercise;Balance training;Neuromuscular re-education;Patient/family education;Vestibular    PT Next Visit Plan  cont balance/vestibular exercises     PT Home Exercise Plan  x1 viewing; balance on foam added 05-19-17; added heel lifts and heel cord stretch on 06-04-17    Consulted and Agree with Plan of Care  Patient       Patient will benefit from skilled therapeutic intervention in order to improve the following deficits and impairments:  Abnormal gait, Decreased balance, Difficulty walking, Dizziness  Visit Diagnosis: Dizziness and giddiness  Unsteadiness on feet     Problem List Patient Active Problem List   Diagnosis Date Noted  . S/P partial resection of colon 04/10/2015  . Cirrhosis ([place]) 12/08/2014  . Esophageal reflux 12/08/2014  . Esophageal stricture 12/08/2014  . Vertigo 12/08/2014    Alda Lea, PT 11/06/2017, 3:19 PM  [facility] [address] [place], [place], [address] Phone: [phone]   Fax:  [phone]  Name: CARLENA RUYBAL MRN: [id number] Date of Birth: 03-29-1946

## 2017-11-09 ENCOUNTER — Ambulatory Visit: Payer: Medicare Other | Admitting: Physical Therapy

## 2017-11-09 DIAGNOSIS — R2681 Unsteadiness on feet: Secondary | ICD-10-CM | POA: Diagnosis not present

## 2017-11-09 DIAGNOSIS — R42 Dizziness and giddiness: Secondary | ICD-10-CM

## 2017-11-09 DIAGNOSIS — R2689 Other abnormalities of gait and mobility: Secondary | ICD-10-CM | POA: Diagnosis not present

## 2017-11-10 ENCOUNTER — Ambulatory Visit: Payer: Medicare Other | Admitting: Physical Therapy

## 2017-11-10 ENCOUNTER — Encounter: Payer: Self-pay | Admitting: Physical Therapy

## 2017-11-10 NOTE — Therapy (Addendum)
[facility] [address] [place], [place], [address] Phone: [phone]   Fax:  [phone]  Physical Therapy Treatment  Patient Details  Name: Brenda [name] MRN: [id number] Date of Birth: 1945/10/05 Referring Provider: Colin Benton, DO   Encounter Date: 11/09/2017  PT End of Session - 11/10/17 2141    Visit Number  31    Number of Visits  31    Date for PT Re-Evaluation  11/22/17    Authorization Type  MCR    Authorization Time Period  06-04-17 - 08-03-17;  08-10-17 - 09-11-16:  09-10-17 - 10-11-17; 10-01-17 - 11-22-17    PT Start Time  1104    PT Stop Time  1148    PT Time Calculation (min)  44 min       Past Medical History:  Diagnosis Date  . Arthritis   . Bronchitis    hx of when smoked   . Cancer ([place]) 04/16/09   kidney cancer - tx by [facility] per her report released from f/u  . Cataracts, bilateral   . Diverticulitis 11/2004  . Endometriosis 1994  . GERD (gastroesophageal reflux disease)    hx hiatal hernia, hx esophageal stricture s/p dilation  . Headache   . Hepatitis   . History of chicken pox   . History of hiatal hernia   . History of measles   . History of mumps as a child   . Hx of hepatitis    with cirrhosis - per her report from [place] and eval in 2013 at [place]  . Hyperlipidemia   . Left ear pain   . Migraines    pt states vesticular migraines has dizziness in relation   . Numbness and tingling    feet bilat has had for 40 years  . Right knee pain   . Scarlet fever   . Shingles   . Urinary tract bacterial infections    hx of  . Vertigo    associated with headache, intermittent, chronic    Past Surgical History:  Procedure Laterality Date  . ABDOMINAL HYSTERECTOMY  1994   TAH/BSO due to endometriosis  . APPENDECTOMY    . CATARACT EXTRACTION W/ INTRAOCULAR LENS IMPLANT Bilateral 05/2015, 04/2015  . DIAGNOSTIC LAPAROSCOPY    . LAPAROSCOPIC PARTIAL COLECTOMY N/A 04/10/2015    Procedure: LAPAROSCOPIC PARTIAL CECTOMY;  Surgeon: Ralene Ok, MD;  Location: [facility];  Service: General;  Laterality: N/A;  . LUNG BIOPSY  1996   Negative  . NEPHRECTOMY  04/16/09   Partial removal due to cancer  . OOPHORECTOMY    . TONSILLECTOMY    . TONSILLECTOMY      There were no vitals filed for this visit.  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 with sequelae?); h/o kidney cancer; chronic headaches    Patient Stated Goals  "would like to be comfortable enough to not have to use cane and be self sufficient"    Currently in Pain?  No/denies             Balance Exercises - 11/03/17 1702      Balance Exercises: Standing   Standing  Eyes Opened  Narrow base of support (BOS);Wide (BOA);Head turns;Foam/compliant surface;5 reps    Standing Eyes Closed  Narrow base of support (BOS);Wide (BOA);Head turns;Foam/compliant surface;5 reps    SLS with Vectors  Foam/compliant surface    Rockerboard  Anterior/posterior;Head turns;EO;10 reps    Partial Tandem Stance  Eyes open;Foam/compliant surface;5 reps with horizontal and vertical head turns     Recumbent Bike  SciFit level 3.0 x 15" with UE's and LE's no charge as unsupervised         Probation officer;Head turns;EO;10 reps   Other Standing Exercises  Pt performed marching on blue mat on floor - with EO and EC with horiozntal and vertical head turns 5 reps each; partial tandem stance on mat with EO and EC with head turns side to side and up/down 5 reps each     Pt performed amb. With sudden stops and turns for habituation with turning Amb. 40 ' making circles with  ball - counterclockwise, clockwise, "V", and t directions - 2 reps each direction                            PT Long Term Goals - 10/28/17 1414      PT LONG TERM GOAL #2   Title  Improve DVA to </= 2 line difference for improved gaze stabilization.    Baseline  2 line difference with c/o dizziness upon completion of testing - rates dizziness 3/10 -- 07-02-17; rates dizziness 2/10 after completion of test    Time  4    Period  Weeks    Status  On-going    Target Date  11/22/17      PT LONG TERM GOAL #4   Title  Pt will report at least 25% improvement in vertigo.    Baseline  45% improvement reported on 06-04-17; pt reports increased vertigo experienced since right after Christmas - 10-01-17    Time  4    Period  Weeks    Status  On-going    Target Date  11/22/17      PT LONG TERM GOAL #5   Title  Independent in HEP for balance and vestibular exercises.    Time  4    Period  Weeks    Status  Achieved      PT LONG TERM GOAL #6   Title  Pt will report at least 50% improvement in vertigo.    Time  4    Period  Weeks    Status  On-going    Target Date  11/22/17      PT LONG TERM GOAL #7   Title  Pt will amb. 19' with horizontal head turns without moderate LOB with dizziness rating </= 3/10 intensity.    Baseline  dizziness 5/10 intensity  08-13-17;; dizziness rating 6/10 on 10-01-17    Time  4    Period  Weeks    Status  On-going    Target Date  11/22/17      PT LONG TERM GOAL  #9   TITLE  Pt will improve DHI score from 72% to </= 48% to demo improvement in vertigo. REVISED:  Improve DHI score from 84% on 10-01-17 to </ 60%      Baseline  72% 08-13-17;  84% on 10-01-17    Time  4    Period  Weeks    Status  On-going    Target Date  11/22/17  Patient will benefit from skilled therapeutic intervention in order to improve the following deficits and impairments:     Visit Diagnosis: Unsteadiness on feet  Dizziness and  giddiness     Problem List Patient Active Problem List   Diagnosis Date Noted  . S/P partial resection of colon 04/10/2015  . Cirrhosis ([place]) 12/08/2014  . Esophageal reflux 12/08/2014  . Esophageal stricture 12/08/2014  . Vertigo 12/08/2014    Alda Lea, PT 11/10/2017, 9:43 PM  [facility] [address] [place], [place], [address] Phone: [phone]   Fax:  [phone]  Name: Brenda [name] MRN: [id number] Date of Birth: 12/28/1945

## 2017-11-16 ENCOUNTER — Ambulatory Visit: Payer: Medicare Other | Admitting: Physical Therapy

## 2017-11-16 DIAGNOSIS — R2681 Unsteadiness on feet: Secondary | ICD-10-CM

## 2017-11-16 DIAGNOSIS — R2689 Other abnormalities of gait and mobility: Secondary | ICD-10-CM | POA: Diagnosis not present

## 2017-11-16 DIAGNOSIS — R42 Dizziness and giddiness: Secondary | ICD-10-CM | POA: Diagnosis not present

## 2017-11-17 ENCOUNTER — Ambulatory Visit: Payer: Medicare Other | Admitting: Physical Therapy

## 2017-11-17 ENCOUNTER — Encounter: Payer: Self-pay | Admitting: Physical Therapy

## 2017-11-17 DIAGNOSIS — R42 Dizziness and giddiness: Secondary | ICD-10-CM

## 2017-11-17 DIAGNOSIS — R2689 Other abnormalities of gait and mobility: Secondary | ICD-10-CM | POA: Diagnosis not present

## 2017-11-17 DIAGNOSIS — R2681 Unsteadiness on feet: Secondary | ICD-10-CM

## 2017-11-17 NOTE — Therapy (Signed)
[facility] [address] [place], [place], [address] Phone: [phone]   Fax:  [phone]  Physical Therapy Treatment  Patient Details  Name: Brenda [name] MRN: [id number] Date of Birth: [birth year] Referring Provider: Colin Benton, DO   Encounter Date: 11/16/2017  PT End of Session - 11/17/17 1647    Visit Number  32    Number of Visits  32    Date for PT Re-Evaluation  11/22/17    Authorization Type  MCR    Authorization Time Period  06-04-17 - 08-03-17;  08-10-17 - 09-11-16:  09-10-17 - 10-11-17; 10-01-17 - 11-22-17    PT Start Time  1149    PT Stop Time  1236    PT Time Calculation (min)  47 min       Past Medical History:  Diagnosis Date  . Arthritis   . Bronchitis    hx of when smoked   . Cancer ([place]) 04/16/09   kidney cancer - [facility] per her report released from f/u  . Cataracts, bilateral   . Diverticulitis 11/2004  . Endometriosis 1994  . GERD (gastroesophageal reflux disease)    hx hiatal hernia, hx esophageal stricture s/p dilation  . Headache   . Hepatitis   . History of chicken pox   . History of hiatal hernia   . History of measles   . History of mumps as a child   . Hx of hepatitis    with cirrhosis - per her report from [place] and eval in 2013 at [place]  . Hyperlipidemia   . Left ear pain   . Migraines    pt states vesticular migraines has dizziness in relation   . Numbness and tingling    feet bilat has had for 40 years  . Right knee pain   . Scarlet fever   . Shingles   . Urinary tract bacterial infections    hx of  . Vertigo    associated with headache, intermittent, chronic    Past Surgical History:  Procedure Laterality Date  . ABDOMINAL HYSTERECTOMY  1994   TAH/BSO due to endometriosis  . APPENDECTOMY    . CATARACT EXTRACTION W/ INTRAOCULAR LENS IMPLANT Bilateral 05/2015, 04/2015  . DIAGNOSTIC LAPAROSCOPY    . LAPAROSCOPIC PARTIAL COLECTOMY N/A 04/10/2015    Procedure: LAPAROSCOPIC PARTIAL CECTOMY;  Surgeon: Ralene Ok, MD;  Location: [facility];  Service: General;  Laterality: N/A;  . LUNG BIOPSY  1996   Negative  . NEPHRECTOMY  04/16/09   Partial removal due to cancer  . OOPHORECTOMY    . TONSILLECTOMY    . TONSILLECTOMY      There were no vitals filed for this visit.  Subjective Assessment - 11/17/17 1103    Subjective  Pt states she gets tired very easily - doesn't know if the medication is causing it or if it's her condition; rates dizziness 2-2.5/10    Pertinent History  idiopathic peripheral neuropathy; pt was prescribed medication for migraines but states medication did not help; pt has sensorineural hearing loss (initally labyrinthitis? with sequelae?); h/o kidney cancer; chronic headaches    Patient Stated Goals  "would like to be comfortable enough to not have to use cane and be self sufficient"    Currently in Pain?  No/denies                       [facility] Adult PT Treatment/Exercise - 11/17/17 0001  High Level Balance   High Level Balance Activities  Backward walking;Direction changes;Sudden stops;Head turns;Marching forwards      Neuro Re-ed    Neuro Re-ed Details   Pt performed habituation exericses on blue mat for compliant surface training in combination for habituation of vertigo; crossovers 1 rep each leg with 180 degree turn, crossover , 180 degree turn for 6 reps ;  pt performed diagonal reaching - standing on blue mat reaching down toward Rt /Lt foot and then turning to look over opposite shoulder with return to upright - 5 reps eahc direction;  pt performed picking up 1 cone off floor, turnign 180 degrees to place on cabinet shelf behind her - 2 reps from floor to cabinet , back down to floor and back to shelf;  pt reported increased vertigo with this activity      Knee/Hip Exercises: Aerobic   Recumbent Bike  SciFit level 3.0 x 10" with UE's and LE's          Balance Exercises - 11/17/17 1647       Balance Exercises: Standing   Standing Eyes Opened  Narrow base of support (BOS);Wide (BOA);Head turns;Foam/compliant surface;5 reps    Standing Eyes Closed  Narrow base of support (BOS);Wide (BOA);Head turns;Foam/compliant surface    Rockerboard  Anterior/posterior;Head turns;EO;10 reps    Partial Tandem Stance  Eyes open;Foam/compliant surface;5 reps with horizontal and vertical head turns    Other Standing Exercises  Pt performed marching on blue mat on floor - with EO and EC with horiozntal and vertical head turns 5 reps each; partial tandem stance on mat with EO and EC with head turns side to side and up/down 5 reps each              PT Long Term Goals - 10/28/17 1414      PT LONG TERM GOAL #2   Title  Improve DVA to </= 2 line difference for improved gaze stabilization.    Baseline  2 line difference with c/o dizziness upon completion of testing - rates dizziness 3/10 -- 07-02-17; rates dizziness 2/10 after completion of test    Time  4    Period  Weeks    Status  On-going    Target Date  11/22/17      PT LONG TERM GOAL #4   Title  Pt will report at least 25% improvement in vertigo.    Baseline  45% improvement reported on 06-04-17; pt reports increased vertigo experienced since right after Christmas - 10-01-17    Time  4    Period  Weeks    Status  On-going    Target Date  11/22/17      PT LONG TERM GOAL #5   Title  Independent in HEP for balance and vestibular exercises.    Time  4    Period  Weeks    Status  Achieved      PT LONG TERM GOAL #6   Title  Pt will report at least 50% improvement in vertigo.    Time  4    Period  Weeks    Status  On-going    Target Date  11/22/17      PT LONG TERM GOAL #7   Title  Pt will amb. 38' with horizontal head turns without moderate LOB with dizziness rating </= 3/10 intensity.    Baseline  dizziness 5/10 intensity  08-13-17;; dizziness rating 6/10 on 10-01-17    Time  4  Period  Weeks    Status  On-going    Target  Date  11/22/17      PT LONG TERM GOAL  #9   TITLE  Pt will improve DHI score from 72% to </= 48% to demo improvement in vertigo. REVISED:  Improve DHI score from 84% on 10-01-17 to </ 60%      Baseline  72% 08-13-17;  84% on 10-01-17    Time  4    Period  Weeks    Status  On-going    Target Date  11/22/17            Plan - 11/17/17 1649    Clinical Impression Statement  Pt progressing well towards goals - continues to c/o dizziness/pressure in head which varies on daily basis; pt demonstrating progress with ability to recover LOB, especially after quick turns during ambulation    Rehab Potential  Good    PT Frequency  2x / week    PT Duration  4 weeks    PT Treatment/Interventions  ADLs/Self Care Home Management;Gait training;Stair training;Therapeutic activities;Therapeutic exercise;Balance training;Neuromuscular re-education;Patient/family education;Vestibular    PT Next Visit Plan  cont balance/vestibular exercises     PT Home Exercise Plan  x1 viewing; balance on foam added 05-19-17; added heel lifts and heel cord stretch on 06-04-17    Consulted and Agree with Plan of Care  Patient       Patient will benefit from skilled therapeutic intervention in order to improve the following deficits and impairments:  Abnormal gait, Decreased balance, Difficulty walking, Dizziness  Visit Diagnosis: Unsteadiness on feet  Dizziness and giddiness     Problem List Patient Active Problem List   Diagnosis Date Noted  . S/P partial resection of colon 04/10/2015  . Cirrhosis ([place]) 12/08/2014  . Esophageal reflux 12/08/2014  . Esophageal stricture 12/08/2014  . Vertigo 12/08/2014    Alda Lea, PT 11/17/2017, 4:51 PM  [facility] [address] [place], [place], [address] Phone: [phone]   Fax:  [phone]  Name: Brenda [name] MRN: [id number] Date of Birth: [birth year]

## 2017-11-18 ENCOUNTER — Encounter: Payer: Self-pay | Admitting: Physical Therapy

## 2017-11-18 NOTE — Therapy (Addendum)
[facility] [address] [place], [place], [address] Phone: [phone]   Fax:  [phone]  Physical Therapy Treatment  Patient Details  Name: Brenda [name] MRN: [id number] Date of Birth: 09-21-1945 Referring Provider: Colin Benton, DO   Encounter Date: 11/17/2017    Past Medical History:  Diagnosis Date  . Arthritis   . Bronchitis    hx of when smoked   . Cancer ([place]) 04/16/09   kidney cancer - tx by [facility] per her report released from f/u  . Cataracts, bilateral   . Diverticulitis 11/2004  . Endometriosis 1994  . GERD (gastroesophageal reflux disease)    hx hiatal hernia, hx esophageal stricture s/p dilation  . Headache   . Hepatitis   . History of chicken pox   . History of hiatal hernia   . History of measles   . History of mumps as a child   . Hx of hepatitis    with cirrhosis - per her report from [place] and eval in 2013 at [place] [facility]  . Hyperlipidemia   . Left ear pain   . Migraines    pt states vesticular migraines has dizziness in relation   . Numbness and tingling    feet bilat has had for 40 years  . Right knee pain   . Scarlet fever   . Shingles   . Urinary tract bacterial infections    hx of  . Vertigo    associated with headache, intermittent, chronic    Past Surgical History:  Procedure Laterality Date  . ABDOMINAL HYSTERECTOMY  1994   TAH/BSO due to endometriosis  . APPENDECTOMY    . CATARACT EXTRACTION W/ INTRAOCULAR LENS IMPLANT Bilateral 05/2015, 04/2015  . DIAGNOSTIC LAPAROSCOPY    . LAPAROSCOPIC PARTIAL COLECTOMY N/A 04/10/2015   Procedure: LAPAROSCOPIC PARTIAL CECTOMY;  Surgeon: Ralene Ok, MD;  Location: [facility];  Service: General;  Laterality: N/A;  . LUNG BIOPSY  1996   Negative  . NEPHRECTOMY  04/16/09   Partial removal due to cancer  . OOPHORECTOMY    . TONSILLECTOMY    . TONSILLECTOMY      There were no vitals filed for this  visit.                    [facility] Adult PT Treatment/Exercise - 11/22/17 0001      Ambulation/Gait   Ambulation/Gait  Yes    Ambulation/Gait Assistance  5: Supervision    Ambulation Distance (Feet)  200 Feet    Assistive device  None    Gait Pattern  Within Functional Limits    Ambulation Surface  Indoor;Level    Gait Comments  Pt amb. making circles clockwise and counterclockwise with ball for improved VOR/gaze stabilization also making letter "V" with ball for improved gaze stabiliza      High Level Balance   High Level Balance Activities  Sudden stops;Turns;Backward walking;Marching forwards with head turns          Balance Exercises - 11/22/17 1407      Balance Exercises: Standing   Standing Eyes Opened  Narrow base of support (BOS);Wide (BOA);Head turns;Foam/compliant surface;5 reps    Standing Eyes Closed  Narrow base of support (BOS);Wide (BOA);Head turns;Foam/compliant surface    SLS with Vectors  Foam/compliant surface    Rockerboard  Anterior/posterior;Head turns;EO;10 reps    Balance Beam  standing on blue balance beam in tandem and perpendicular stance with UE support prn inside // bars with  head turns with EO and EC             PT Long Term Goals - 10/28/17 1414      PT LONG TERM GOAL #2   Title  Improve DVA to </= 2 line difference for improved gaze stabilization.    Baseline  2 line difference with c/o dizziness upon completion of testing - rates dizziness 3/10 -- 07-02-17; rates dizziness 2/10 after completion of test    Time  4    Period  Weeks    Status  On-going    Target Date  11/22/17      PT LONG TERM GOAL #4   Title  Pt will report at least 25% improvement in vertigo.    Baseline  45% improvement reported on 06-04-17; pt reports increased vertigo experienced since right after Christmas - 10-01-17    Time  4    Period  Weeks    Status  On-going    Target Date  11/22/17      PT LONG TERM GOAL #5   Title  Independent in HEP for  balance and vestibular exercises.    Time  4    Period  Weeks    Status  Achieved      PT LONG TERM GOAL #6   Title  Pt will report at least 50% improvement in vertigo.    Time  4    Period  Weeks    Status  On-going    Target Date  11/22/17      PT LONG TERM GOAL #7   Title  Pt will amb. 75' with horizontal head turns without moderate LOB with dizziness rating </= 3/10 intensity.    Baseline  dizziness 5/10 intensity  08-13-17;; dizziness rating 6/10 on 10-01-17    Time  4    Period  Weeks    Status  On-going    Target Date  11/22/17      PT LONG TERM GOAL  #9   TITLE  Pt will improve DHI score from 72% to </= 48% to demo improvement in vertigo. REVISED:  Improve DHI score from 84% on 10-01-17 to </ 60%      Baseline  72% 08-13-17;  84% on 10-01-17    Time  4    Period  Weeks    Status  On-going    Target Date  11/22/17            Plan - 11/22/17 1412    Clinical Impression Statement  Pt continues to progress well towards goals, but c/o pressure and dizziness with vestibular/balance activities requiring incr. vestibular input     PT Frequency  2x / week    PT Duration  4 weeks    PT Treatment/Interventions  ADLs/Self Care Home Management;Gait training;Stair training;Therapeutic activities;Therapeutic exercise;Balance training;Neuromuscular re-education;Patient/family education;Vestibular    PT Next Visit Plan  check goals/renew    PT Home Exercise Plan  x1 viewing; balance on foam added 05-19-17; added heel lifts and heel cord stretch on 06-04-17    Consulted and Agree with Plan of Care  Patient       Patient will benefit from skilled therapeutic intervention in order to improve the following deficits and impairments:  Abnormal gait, Decreased balance, Difficulty walking, Dizziness  Visit Diagnosis: Unsteadiness on feet  Dizziness and giddiness     Problem List Patient Active Problem List   Diagnosis Date Noted  . S/P partial resection of colon 04/10/2015  .  Cirrhosis ([place]) 12/08/2014  . Esophageal reflux 12/08/2014  . Esophageal stricture 12/08/2014  . Vertigo 12/08/2014    [place], PT 11/22/2017, 2:15 PM  [facility] [address] [place], [place], [address] Phone: [phone]   Fax:  [phone]  Name: Brenda [name] MRN: [id number] Date of Birth: 12-29-1945

## 2017-11-23 ENCOUNTER — Ambulatory Visit: Payer: Medicare Other | Attending: Family Medicine | Admitting: Physical Therapy

## 2017-11-23 DIAGNOSIS — R42 Dizziness and giddiness: Secondary | ICD-10-CM | POA: Insufficient documentation

## 2017-11-23 DIAGNOSIS — R2681 Unsteadiness on feet: Secondary | ICD-10-CM | POA: Insufficient documentation

## 2017-11-23 DIAGNOSIS — R2689 Other abnormalities of gait and mobility: Secondary | ICD-10-CM | POA: Diagnosis not present

## 2017-11-24 ENCOUNTER — Encounter: Payer: Self-pay | Admitting: Physical Therapy

## 2017-11-24 NOTE — Therapy (Signed)
[facility] [address] [place], [place], [address] Phone: [phone]   Fax:  [phone]  Physical Therapy Treatment  Patient Details  Name: Brenda [name] MRN: [id number] Date of Birth: 07/23/1946 Referring Provider: Colin Benton, DO   Encounter Date: 11/23/2017  PT End of Session - 11/24/17 2213    Visit Number  34    Number of Visits  42    Authorization Type  MCR    PT Start Time  0933    PT Stop Time  1017    PT Time Calculation (min)  44 min    Equipment Utilized During Treatment  Gait belt       Past Medical History:  Diagnosis Date  . Arthritis   . Bronchitis    hx of when smoked   . Cancer ([place]) 04/16/09   kidney cancer - tx by [facility] per her report released from f/u  . Cataracts, bilateral   . Diverticulitis 11/2004  . Endometriosis 1994  . GERD (gastroesophageal reflux disease)    hx hiatal hernia, hx esophageal stricture s/p dilation  . Headache   . Hepatitis   . History of chicken pox   . History of hiatal hernia   . History of measles   . History of mumps as a child   . Hx of hepatitis    with cirrhosis - per her report from [facility] and eval in 2013 at [facility]  . Hyperlipidemia   . Left ear pain   . Migraines    pt states vesticular migraines has dizziness in relation   . Numbness and tingling    feet bilat has had for 40 years  . Right knee pain   . Scarlet fever   . Shingles   . Urinary tract bacterial infections    hx of  . Vertigo    associated with headache, intermittent, chronic    Past Surgical History:  Procedure Laterality Date  . ABDOMINAL HYSTERECTOMY  1994   TAH/BSO due to endometriosis  . APPENDECTOMY    . CATARACT EXTRACTION W/ INTRAOCULAR LENS IMPLANT Bilateral 05/2015, 04/2015  . DIAGNOSTIC LAPAROSCOPY    . LAPAROSCOPIC PARTIAL COLECTOMY N/A 04/10/2015   Procedure: LAPAROSCOPIC PARTIAL CECTOMY;  Surgeon: Ralene Ok, MD;  Location: [facility];   Service: General;  Laterality: N/A;  . LUNG BIOPSY  1996   Negative  . NEPHRECTOMY  04/16/09   Partial removal due to cancer  . OOPHORECTOMY    . TONSILLECTOMY    . TONSILLECTOMY      There were no vitals filed for this visit.  Subjective Assessment - 11/24/17 2209    Subjective  Pt states her balance has been off this morning - does not know why    Pertinent History  idiopathic peripheral neuropathy; pt was prescribed medication for migraines but states medication did not help; pt has sensorineural hearing loss (initally labyrinthitis? with sequelae?); h/o kidney cancer; chronic headaches    Patient Stated Goals  "would like to be comfortable enough to not have to use cane and be self sufficient"    Currently in Pain?  No/denies                       [facility] Adult PT Treatment/Exercise - 11/24/17 0001      Ambulation/Gait   Ambulation/Gait  Yes    Ambulation/Gait Assistance  5: Supervision    Ambulation Distance (Feet)  250 Feet    Assistive  device  None    Gait Pattern  Within Functional Limits    Ambulation Surface  Level;Indoor    Gait Comments  Pt amb. making circles clockwise and counterclockwise with ball for improved VOR/gaze stabilization also making letter "V" with ball for improved gaze stabiliza      High Level Balance   High Level Balance Activities  Braiding;Backward walking;Direction changes;Sudden stops;Head turns;Tandem walking;Marching forwards          Balance Exercises - 11/24/17 2212      Balance Exercises: Standing   Standing Eyes Opened  Narrow base of support (BOS);Wide (BOA);Head turns;Foam/compliant surface;5 reps    Standing Eyes Closed  Narrow base of support (BOS);Wide (BOA);Head turns;Foam/compliant surface    SLS with Vectors  Foam/compliant surface    Rockerboard  Anterior/posterior;Head turns;EO;10 reps    Balance Beam  standing on blue balance beam in tandem and perpendicular stance with UE support prn inside // bars with  head turns with EO and EC    Partial Tandem Stance  Eyes open;Foam/compliant surface;5 reps with horizontal and vertical head turns             PT Long Term Goals - 11/24/17 2220      PT LONG TERM GOAL #1   Title  Improve TUG score to </= 13.0 secs with cane for reduced fall risk.    Status  Achieved      PT LONG TERM GOAL #2   Title  Improve DVA to </= 2 line difference for improved gaze stabilization.    Baseline  2 line difference with c/o dizziness upon completion of testing - rates dizziness 3/10 -- 07-02-17; rates dizziness 2/10 after completion of test    Time  4    Period  Weeks    Status  On-going    Target Date  12/22/17      PT LONG TERM GOAL #3   Title  Increase gait velocity from 2.52 to >/= 3.1 ft/sec with cane for incr. gait efficiency.    Baseline  10.19 secs = 3.22 ft/sec;  11.38 secs    13.19 secs on 10-01-17    Status  Achieved      PT LONG TERM GOAL #4   Title  Pt will report at least 25% improvement in vertigo.    Baseline  45% improvement reported on 06-04-17; pt reports increased vertigo experienced since right after Christmas - 10-01-17    Time  4    Period  Weeks    Status  On-going    Target Date  12/22/17      PT LONG TERM GOAL #5   Title  Independent in HEP for balance and vestibular exercises.    Status  Achieved      PT LONG TERM GOAL #6   Title  Pt will report at least 50% improvement in vertigo.    Baseline  pt reports status varies depending on intensity of vertigo/balance - 08-13-17; pt states vertigo has stayed constant since Christmas (2.5-3/10 - 6.5)    Time  4    Period  Weeks    Status  On-going    Target Date  12/22/17      PT LONG TERM GOAL #7   Title  Pt will amb. 18' with horizontal head turns without moderate LOB with dizziness rating </= 3/10 intensity.    Baseline  dizziness 5/10 intensity  08-13-17;; dizziness rating 6/10 on 10-01-17    Time  4    Period  Weeks    Status  On-going    Target Date  12/22/17      PT LONG  TERM GOAL #8   Title  Amb. 300' modified independently without cane on flat,even surface without LOB.    Status  Achieved      PT LONG TERM GOAL  #9   TITLE  Pt will improve DHI score from 72% to </= 48% to demo improvement in vertigo. REVISED:  Improve DHI score from 84% on 10-01-17 to </ 60%      Baseline  72% 08-13-17;  84% on 10-01-17    Time  4    Period  Weeks    Status  On-going    Target Date  12/22/17      PT LONG TERM GOAL  #10   TITLE  Improve Lt SLS to >/= 6 secs     Baseline  4.75 secs on LLE = 7.62 secs on LLE on 10-01-17    Status  Achieved            Plan - 11/24/17 2215    Clinical Impression Statement  Pt progressing well with improved balance and gait; pt continues to c/o pressure in head and c/o dizziness with vestibular activities, but does not have major LOB with dynamic activities     Rehab Potential  Good    PT Frequency  2x / week    PT Duration  4 weeks    PT Treatment/Interventions  ADLs/Self Care Home Management;Gait training;Stair training;Therapeutic activities;Therapeutic exercise;Balance training;Neuromuscular re-education;Patient/family education;Vestibular    PT Next Visit Plan  check goals/renew; cont balance activities    PT Home Exercise Plan  x1 viewing; balance on foam added 05-19-17; added heel lifts and heel cord stretch on 06-04-17    Consulted and Agree with Plan of Care  Patient       Patient will benefit from skilled therapeutic intervention in order to improve the following deficits and impairments:  Abnormal gait, Decreased balance, Difficulty walking, Dizziness  Visit Diagnosis: Unsteadiness on feet  Dizziness and giddiness  Other abnormalities of gait and mobility     Problem List Patient Active Problem List   Diagnosis Date Noted  . S/P partial resection of colon 04/10/2015  . Cirrhosis ([place]) 12/08/2014  . Esophageal reflux 12/08/2014  . Esophageal stricture 12/08/2014  . Vertigo 12/08/2014    Alda Lea,  PT 11/24/2017, 10:23 PM  [facility] [address] [place], [place], [address] Phone: [phone]   Fax:  [phone]  Name: Brenda [name] MRN: [id number] Date of Birth: 01-15-1946

## 2017-11-26 ENCOUNTER — Ambulatory Visit: Payer: Medicare Other | Admitting: Physical Therapy

## 2017-11-26 DIAGNOSIS — R2689 Other abnormalities of gait and mobility: Secondary | ICD-10-CM

## 2017-11-26 DIAGNOSIS — R42 Dizziness and giddiness: Secondary | ICD-10-CM | POA: Diagnosis not present

## 2017-11-26 DIAGNOSIS — R2681 Unsteadiness on feet: Secondary | ICD-10-CM

## 2017-11-27 ENCOUNTER — Encounter: Payer: Self-pay | Admitting: Physical Therapy

## 2017-11-27 NOTE — Therapy (Signed)
[facility] [address] [place], [place], [address] Phone: [phone]   Fax:  [phone]  Physical Therapy Treatment  Patient Details  Name: Brenda [name] MRN: [id number] Date of Birth: 05-21-1946 Referring Provider: Colin Benton, DO   Encounter Date: 11/26/2017  PT End of Session - 11/27/17 1729    Visit Number  35    Number of Visits  42    Date for PT Re-Evaluation  11/22/17    Authorization Type  MCR    Authorization Time Period  06-04-17 - 08-03-17;  08-10-17 - 09-11-16:  09-10-17 - 10-11-17; 10-01-17 - 11-22-17; 11-23-17 - 01-23-18    PT Start Time  1019    PT Stop Time  1103    PT Time Calculation (min)  44 min    Equipment Utilized During Treatment  Gait belt       Past Medical History:  Diagnosis Date  . Arthritis   . Bronchitis    hx of when smoked   . Cancer ([place]) 04/16/09   kidney cancer - [facility] per her report released from f/u  . Cataracts, bilateral   . Diverticulitis 11/2004  . Endometriosis 1994  . GERD (gastroesophageal reflux disease)    hx hiatal hernia, hx esophageal stricture s/p dilation  . Headache   . Hepatitis   . History of chicken pox   . History of hiatal hernia   . History of measles   . History of mumps as a child   . Hx of hepatitis    with cirrhosis - per her report from [facility] and eval in 2013 at [facility]  . Hyperlipidemia   . Left ear pain   . Migraines    pt states vesticular migraines has dizziness in relation   . Numbness and tingling    feet bilat has had for 40 years  . Right knee pain   . Scarlet fever   . Shingles   . Urinary tract bacterial infections    hx of  . Vertigo    associated with headache, intermittent, chronic    Past Surgical History:  Procedure Laterality Date  . ABDOMINAL HYSTERECTOMY  1994   TAH/BSO due to endometriosis  . APPENDECTOMY    . CATARACT EXTRACTION W/ INTRAOCULAR LENS IMPLANT Bilateral 05/2015, 04/2015  .  DIAGNOSTIC LAPAROSCOPY    . LAPAROSCOPIC PARTIAL COLECTOMY N/A 04/10/2015   Procedure: LAPAROSCOPIC PARTIAL CECTOMY;  Surgeon: Ralene Ok, MD;  Location: [facility];  Service: General;  Laterality: N/A;  . LUNG BIOPSY  1996   Negative  . NEPHRECTOMY  04/16/09   Partial removal due to cancer  . OOPHORECTOMY    . TONSILLECTOMY    . TONSILLECTOMY      There were no vitals filed for this visit.  Subjective Assessment - 11/27/17 1723    Subjective  Pt states she lost her balance after walking down a slight incline and stepping down on level ground - was able to recover independently     Pertinent History  idiopathic peripheral neuropathy; pt was prescribed medication for migraines but states medication did not help; pt has sensorineural hearing loss (initally labyrinthitis? with sequelae?); h/o kidney cancer; chronic headaches    Patient Stated Goals  "would like to be comfortable enough to not have to use cane and be self sufficient"    Currently in Pain?  No/denies  [facility] Adult PT Treatment/Exercise - 11/27/17 0001      Ambulation/Gait   Ambulation/Gait  Yes    Ambulation/Gait Assistance  5: Supervision    Ambulation Distance (Feet)  150 Feet    Assistive device  None    Gait Pattern  Within Functional Limits    Ambulation Surface  Level;Indoor    Gait Comments  Pt amb. making circles clockwise and counterclockwise with ball for improved VOR/gaze stabilization also making letter "V" with ball for improved gaze stabiliza      High Level Balance   High Level Balance Activities  Side stepping;Backward walking;Turns;Sudden stops;Head turns;Marching forwards;Marching backwards on blue mat           Balance Exercises - 11/27/17 1726      Balance Exercises: Standing   Standing Eyes Opened  Narrow base of support (BOS);Wide (BOA);Head turns;Foam/compliant surface;5 reps    Standing Eyes Closed  Narrow base of support (BOS);Wide (BOA);Head  turns;Foam/compliant surface    SLS with Vectors  Foam/compliant surface on Bosu- with // bars    Rockerboard  Anterior/posterior;Head turns;EO;10 reps    Partial Tandem Stance  Eyes open;Foam/compliant surface;5 reps with horizontal and vertical head turns             PT Long Term Goals - 11/27/17 1749      PT LONG TERM GOAL #2   Title  Improve DVA to </= 2 line difference for improved gaze stabilization.    Baseline  met 11-23-17    Status  Achieved      PT LONG TERM GOAL #3   Title  Increase gait velocity from 2.52 to >/= 3.1 ft/sec with cane for incr. gait efficiency.    Status  Achieved      PT LONG TERM GOAL #4   Title  Pt will report at least 25% improvement in vertigo.    Baseline  45% improvement reported on 06-04-17; pt reports increased vertigo experienced since right after Christmas - 10-01-17    Status  On-going    Target Date  12/22/17      PT LONG TERM GOAL #5   Title  Independent in HEP for balance and vestibular exercises.    Status  Achieved      PT LONG TERM GOAL #6   Title  Pt will report at least 50% improvement in vertigo.    Baseline  pt reports status varies depending on intensity of vertigo/balance - 08-13-17; pt states vertigo has stayed constant since Christmas (2.5-3/10 - 6.5)    Time  4    Period  Weeks    Status  On-going    Target Date  12/22/17      PT LONG TERM GOAL #7   Title  Pt will amb. 41' with horizontal head turns without moderate LOB with dizziness rating </= 3/10 intensity.    Baseline  dizziness 5/10 intensity  08-13-17;; dizziness rating 6/10 on 10-01-17; 4/10 intensity on 11-23-17    Time  4    Period  Weeks    Status  On-going    Target Date  12/22/17      PT LONG TERM GOAL #8   Title  Amb. 300' modified independently without cane on flat,even surface without LOB.    Status  Achieved      PT LONG TERM GOAL  #9   TITLE  Pt will improve DHI score from 72% to </= 48% to demo improvement in vertigo. REVISED:  Improve DHI score  from  84% on 10-01-17 to </ 60%      Baseline  72% 08-13-17;  84% on 10-01-17    Time  4    Period  Weeks    Status  On-going    Target Date  12/22/17      PT LONG TERM GOAL  #10   TITLE  Improve Lt SLS to >/= 6 secs     Baseline  4.75 secs on LLE = 7.62 secs on LLE on 10-01-17    Time  4    Period  Weeks    Status  On-going    Target Date  12/22/17            Plan - 11/27/17 1731    Clinical Impression Statement  Pt is improving with balance and gait; able to recover LOB independently but continues to c/o dizziness and pressure in head with vestibular stimulating balance activities    Rehab Potential  Good    PT Frequency  2x / week    PT Duration  4 weeks    PT Treatment/Interventions  ADLs/Self Care Home Management;Gait training;Stair training;Therapeutic activities;Therapeutic exercise;Balance training;Neuromuscular re-education;Patient/family education;Vestibular    PT Next Visit Plan  cont balance/vestibular exercises    PT Home Exercise Plan  x1 viewing; balance on foam added 05-19-17; added heel lifts and heel cord stretch on 06-04-17    Consulted and Agree with Plan of Care  Patient       Patient will benefit from skilled therapeutic intervention in order to improve the following deficits and impairments:  Abnormal gait, Decreased balance, Difficulty walking, Dizziness  Visit Diagnosis: Unsteadiness on feet - Plan: PT plan of care cert/re-cert  Dizziness and giddiness - Plan: PT plan of care cert/re-cert  Other abnormalities of gait and mobility - Plan: PT plan of care cert/re-cert     Problem List Patient Active Problem List   Diagnosis Date Noted  . S/P partial resection of colon 04/10/2015  . Cirrhosis ([place]) 12/08/2014  . Esophageal reflux 12/08/2014  . Esophageal stricture 12/08/2014  . Vertigo 12/08/2014    Alda Lea, PT 11/27/2017, 7:10 PM  [facility] [address] [place], [place], [address] Phone: [phone]   Fax:  [phone]  Name: Brenda [name] MRN: [id number] Date of Birth: September 25, 1945

## 2017-11-30 ENCOUNTER — Ambulatory Visit: Payer: Medicare Other | Admitting: Physical Therapy

## 2017-11-30 DIAGNOSIS — R2681 Unsteadiness on feet: Secondary | ICD-10-CM

## 2017-11-30 DIAGNOSIS — R2689 Other abnormalities of gait and mobility: Secondary | ICD-10-CM | POA: Diagnosis not present

## 2017-11-30 DIAGNOSIS — R42 Dizziness and giddiness: Secondary | ICD-10-CM

## 2017-12-01 ENCOUNTER — Encounter: Payer: Self-pay | Admitting: Physical Therapy

## 2017-12-01 NOTE — Therapy (Signed)
[facility] [address] [place], [place], [address] Phone: [phone]   Fax:  [phone]  Physical Therapy Treatment  Patient Details  Name: Brenda [name] MRN: [id number] Date of Birth: [birth year] Referring Provider: Colin Benton, DO   Encounter Date: 11/30/2017  PT End of Session - 12/01/17 1701    Visit Number  36    Number of Visits  42    Date for PT Re-Evaluation  12/22/17    Authorization Type  MCR    Authorization Time Period  06-04-17 - 08-03-17;  08-10-17 - 09-11-16:  09-10-17 - 10-11-17; 10-01-17 - 11-22-17; 11-23-17 - 01-23-18    PT Start Time  1017    PT Stop Time  1104    PT Time Calculation (min)  47 min       Past Medical History:  Diagnosis Date  . Arthritis   . Bronchitis    hx of when smoked   . Cancer ([place]) 04/16/09   kidney cancer - [facility] per her report released from f/u  . Cataracts, bilateral   . Diverticulitis 11/2004  . Endometriosis 1994  . GERD (gastroesophageal reflux disease)    hx hiatal hernia, hx esophageal stricture s/p dilation  . Headache   . Hepatitis   . History of chicken pox   . History of hiatal hernia   . History of measles   . History of mumps as a child   . Hx of hepatitis    with cirrhosis - per her report from [place] and eval in 2013 at [facility]  . Hyperlipidemia   . Left ear pain   . Migraines    pt states vesticular migraines has dizziness in relation   . Numbness and tingling    feet bilat has had for 40 years  . Right knee pain   . Scarlet fever   . Shingles   . Urinary tract bacterial infections    hx of  . Vertigo    associated with headache, intermittent, chronic    Past Surgical History:  Procedure Laterality Date  . ABDOMINAL HYSTERECTOMY  1994   TAH/BSO due to endometriosis  . APPENDECTOMY    . CATARACT EXTRACTION W/ INTRAOCULAR LENS IMPLANT Bilateral 05/2015, 04/2015  . DIAGNOSTIC LAPAROSCOPY    . LAPAROSCOPIC PARTIAL COLECTOMY  N/A 04/10/2015   Procedure: LAPAROSCOPIC PARTIAL CECTOMY;  Surgeon: Ralene Ok, MD;  Location: [facility];  Service: General;  Laterality: N/A;  . LUNG BIOPSY  1996   Negative  . NEPHRECTOMY  04/16/09   Partial removal due to cancer  . OOPHORECTOMY    . TONSILLECTOMY    . TONSILLECTOMY      There were no vitals filed for this visit.  Subjective Assessment - 12/01/17 1653    Subjective  Pt states she is doing pretty good today - reports "Sunday was a great day" in that she felt good with minimal dizziness    Pertinent History  idiopathic peripheral neuropathy; pt was prescribed medication for migraines but states medication did not help; pt has sensorineural hearing loss (initally labyrinthitis? with sequelae?); h/o kidney cancer; chronic headaches    Patient Stated Goals  "would like to be comfortable enough to not have to use cane and be self sufficient"    Currently in Pain?  No/denies                       [facility] Adult PT Treatment/Exercise - 12/01/17 0001  Ambulation/Gait   Ambulation/Gait  Yes    Ambulation/Gait Assistance  5: Supervision    Ambulation Distance (Feet)  300 Feet    Assistive device  None    Gait Pattern  Within Functional Limits    Ambulation Surface  Level;Indoor    Gait Comments  Pt performed sudden stops/turns with ambulation - turning towards Rt and Lt sides; pt amb. making clockwise and counterclockwise circles, "V" and "t" patters 60' each direction      Knee/Hip Exercises: Aerobic   Nustep  Level 4 x 15" with UE's & LE's no charge as unsupervised          Balance Exercises - 12/01/17 1659      Balance Exercises: Standing   Standing Eyes Opened  Narrow base of support (BOS);Wide (BOA);Head turns;Foam/compliant surface;5 reps    Standing Eyes Closed  Narrow base of support (BOS);Wide (BOA);Foam/compliant surface;5 reps    Rockerboard  Anterior/posterior;Head turns;EO;10 reps    Partial Tandem Stance  Eyes open;5  reps;Foam/compliant surface    Other Standing Exercises  Pt performed marching on incline with EO and EC with head turns - horizontal and vertical -       Pt performed vestibular exercise - standing on foam in corner - trunk rotation straight across, progressing to diagonal Pattern  "X" - with EO and then with EC - 5 reps each  Squats with EO and then with EC 5 reps each  Alternate stepping on incline/decline on blue mat on ramp - 5 reps each with no head movement, progressing to stepping With horizontal head turns on incline/decline Pt amb. Forward/back on mat on incline with CGA       PT Long Term Goals - 12/01/17 1705      PT LONG TERM GOAL #4   Title  Pt will report at least 25% improvement in vertigo.    Target Date  12/22/17      PT LONG TERM GOAL #6   Title  Pt will report at least 50% improvement in vertigo.    Target Date  12/22/17      PT LONG TERM GOAL #7   Title  Pt will amb. 37' with horizontal head turns without moderate LOB with dizziness rating </= 3/10 intensity.    Target Date  12/22/17      PT LONG TERM GOAL  #9   TITLE  Pt will improve DHI score from 72% to </= 48% to demo improvement in vertigo. REVISED:  Improve DHI score from 84% on 10-01-17 to </ 60%      Baseline  72% 08-13-17;  84% on 10-01-17    Target Date  12/22/17      PT LONG TERM GOAL  #10   TITLE  Improve Lt SLS to >/= 6 secs     Target Date  12/22/17            Plan - 12/01/17 1703    Clinical Impression Statement  Pt reported much less dizziness today with vestibular activities - stated that intensity increased to 5/10 but quickly subsided with rest periods; pt progressing towards goals    Rehab Potential  Good    PT Frequency  2x / week    PT Duration  4 weeks    PT Treatment/Interventions  ADLs/Self Care Home Management;Gait training;Stair training;Therapeutic activities;Therapeutic exercise;Balance training;Neuromuscular re-education;Patient/family education;Vestibular    PT Next  Visit Plan  cont balance/vestibular exercises    PT Home Exercise Plan  x1 viewing; balance on  foam added 05-19-17; added heel lifts and heel cord stretch on 06-04-17    Consulted and Agree with Plan of Care  Patient       Patient will benefit from skilled therapeutic intervention in order to improve the following deficits and impairments:  Abnormal gait, Decreased balance, Difficulty walking, Dizziness  Visit Diagnosis: Unsteadiness on feet  Dizziness and giddiness     Problem List Patient Active Problem List   Diagnosis Date Noted  . S/P partial resection of colon 04/10/2015  . Cirrhosis ([place]) 12/08/2014  . Esophageal reflux 12/08/2014  . Esophageal stricture 12/08/2014  . Vertigo 12/08/2014    Alda Lea, PT 12/01/2017, 5:10 PM  [facility] [address] [place], [place], [address] Phone: [phone]   Fax:  [phone]  Name: Brenda [name] MRN: [id number] Date of Birth: 18-Jul-1946

## 2017-12-03 ENCOUNTER — Ambulatory Visit: Payer: Medicare Other | Admitting: Physical Therapy

## 2017-12-03 DIAGNOSIS — R42 Dizziness and giddiness: Secondary | ICD-10-CM | POA: Diagnosis not present

## 2017-12-03 DIAGNOSIS — R2681 Unsteadiness on feet: Secondary | ICD-10-CM | POA: Diagnosis not present

## 2017-12-03 DIAGNOSIS — R2689 Other abnormalities of gait and mobility: Secondary | ICD-10-CM | POA: Diagnosis not present

## 2017-12-04 ENCOUNTER — Encounter: Payer: Self-pay | Admitting: Physical Therapy

## 2017-12-04 NOTE — Therapy (Signed)
[facility] [address] [place], [place], [address] Phone: [phone]   Fax:  [phone]  Physical Therapy Treatment  Patient Details  Name: Brenda [name] MRN: [id number] Date of Birth: [birth year] Referring Provider: Colin Benton, DO   Encounter Date: 12/03/2017  PT End of Session - 12/04/17 1626    Visit Number  37    Number of Visits  42    Date for PT Re-Evaluation  12/22/17    Authorization Type  MCR    Authorization Time Period  06-04-17 - 08-03-17;  08-10-17 - 09-11-16:  09-10-17 - 10-11-17; 10-01-17 - 11-22-17; 11-23-17 - 01-23-18    PT Start Time  0933    PT Stop Time  1019    PT Time Calculation (min)  46 min    Equipment Utilized During Treatment  Gait belt       Past Medical History:  Diagnosis Date  . Arthritis   . Bronchitis    hx of when smoked   . Cancer ([place]) 04/16/09   kidney cancer - tx by [facility] per her report released from f/u  . Cataracts, bilateral   . Diverticulitis 11/2004  . Endometriosis 1994  . GERD (gastroesophageal reflux disease)    hx hiatal hernia, hx esophageal stricture s/p dilation  . Headache   . Hepatitis   . History of chicken pox   . History of hiatal hernia   . History of measles   . History of mumps as a child   . Hx of hepatitis    with cirrhosis - per her report from [facility] and eval in 2013 at [facility]  . Hyperlipidemia   . Left ear pain   . Migraines    pt states vesticular migraines has dizziness in relation   . Numbness and tingling    feet bilat has had for 40 years  . Right knee pain   . Scarlet fever   . Shingles   . Urinary tract bacterial infections    hx of  . Vertigo    associated with headache, intermittent, chronic    Past Surgical History:  Procedure Laterality Date  . ABDOMINAL HYSTERECTOMY  1994   TAH/BSO due to endometriosis  . APPENDECTOMY    . CATARACT EXTRACTION W/ INTRAOCULAR LENS IMPLANT Bilateral 05/2015, 04/2015  .  DIAGNOSTIC LAPAROSCOPY    . LAPAROSCOPIC PARTIAL COLECTOMY N/A 04/10/2015   Procedure: LAPAROSCOPIC PARTIAL CECTOMY;  Surgeon: Ralene Ok, MD;  Location: [facility];  Service: General;  Laterality: N/A;  . LUNG BIOPSY  1996   Negative  . NEPHRECTOMY  04/16/09   Partial removal due to cancer  . OOPHORECTOMY    . TONSILLECTOMY    . TONSILLECTOMY      There were no vitals filed for this visit.  Subjective Assessment - 12/04/17 1623    Subjective  Pt states she is doing good again today - "this has been a good week"    Pertinent History  idiopathic peripheral neuropathy; pt was prescribed medication for migraines but states medication did not help; pt has sensorineural hearing loss (initally labyrinthitis? with sequelae?); h/o kidney cancer; chronic headaches    Patient Stated Goals  "would like to be comfortable enough to not have to use cane and be self sufficient"    Currently in Pain?  No/denies                       [facility] Adult PT Treatment/Exercise -  12/04/17 0001      Ambulation/Gait   Ambulation/Gait  Yes    Ambulation/Gait Assistance  5: Supervision    Ambulation Distance (Feet)  250 Feet    Assistive device  None    Gait Pattern  Within Functional Limits    Ambulation Surface  Level;Indoor    Gait Comments  Pt performed sudden stops/turns with ambulation - turning towards Rt and Lt sides; pt amb. making clockwise and counterclockwise circles, "V" and "t" patters 60' each direction      Knee/Hip Exercises: Aerobic   Nustep  Level 4 x 10" with UE's & LE's no charge as unsupervised          Balance Exercises - 12/04/17 1625      Balance Exercises: Standing   Standing Eyes Opened  Narrow base of support (BOS);Wide (BOA);Head turns;Foam/compliant surface;5 reps    Standing Eyes Closed  Narrow base of support (BOS);Wide (BOA);Foam/compliant surface;5 reps    SLS with Vectors  Foam/compliant surface on Bosu- with // bars    Rockerboard   Anterior/posterior;Head turns;EO;10 reps    Balance Beam  standing on blue balance beam in tandem and perpendicular stance with UE support prn inside // bars with head turns with EO and EC    Partial Tandem Stance  Eyes open;5 reps;Foam/compliant surface    Turning  Right;Left;5 reps walking with turning/stopping             PT Long Term Goals - 12/01/17 1705      PT LONG TERM GOAL #4   Title  Pt will report at least 25% improvement in vertigo.    Target Date  12/22/17      PT LONG TERM GOAL #6   Title  Pt will report at least 50% improvement in vertigo.    Target Date  12/22/17      PT LONG TERM GOAL #7   Title  Pt will amb. 79' with horizontal head turns without moderate LOB with dizziness rating </= 3/10 intensity.    Target Date  12/22/17      PT LONG TERM GOAL  #9   TITLE  Pt will improve DHI score from 72% to </= 48% to demo improvement in vertigo. REVISED:  Improve DHI score from 84% on 10-01-17 to </ 60%      Baseline  72% 08-13-17;  84% on 10-01-17    Target Date  12/22/17      PT LONG TERM GOAL  #10   TITLE  Improve Lt SLS to >/= 6 secs     Target Date  12/22/17            Plan - 12/04/17 1628    Clinical Impression Statement  Pt able to recover balance quickly with sudden stops and turns; balance is improving ; pt reports she has had less dizziness this week.  Pt is progressing towards goals    Rehab Potential  Good    PT Frequency  2x / week    PT Duration  4 weeks    PT Treatment/Interventions  ADLs/Self Care Home Management;Gait training;Stair training;Therapeutic activities;Therapeutic exercise;Balance training;Neuromuscular re-education;Patient/family education;Vestibular    PT Next Visit Plan  cont balance/vestibular exercises    PT Home Exercise Plan  x1 viewing; balance on foam added 05-19-17; added heel lifts and heel cord stretch on 06-04-17    Consulted and Agree with Plan of Care  Patient       Patient will benefit from skilled therapeutic  intervention in  order to improve the following deficits and impairments:  Abnormal gait, Decreased balance, Difficulty walking, Dizziness  Visit Diagnosis: Unsteadiness on feet  Dizziness and giddiness     Problem List Patient Active Problem List   Diagnosis Date Noted  . S/P partial resection of colon 04/10/2015  . Cirrhosis ([place]) 12/08/2014  . Esophageal reflux 12/08/2014  . Esophageal stricture 12/08/2014  . Vertigo 12/08/2014    Alda Lea, PT 12/04/2017, 4:33 PM  [facility] [address] [place], [place], [address] Phone: [phone]   Fax:  [phone]  Name: MANAIA SAMAD MRN: [id number] Date of Birth: [birth year]

## 2017-12-07 ENCOUNTER — Ambulatory Visit: Payer: Medicare Other | Admitting: Physical Therapy

## 2017-12-07 ENCOUNTER — Encounter: Payer: Self-pay | Admitting: Physical Therapy

## 2017-12-07 DIAGNOSIS — R42 Dizziness and giddiness: Secondary | ICD-10-CM

## 2017-12-07 DIAGNOSIS — R2689 Other abnormalities of gait and mobility: Secondary | ICD-10-CM | POA: Diagnosis not present

## 2017-12-07 DIAGNOSIS — R2681 Unsteadiness on feet: Secondary | ICD-10-CM

## 2017-12-07 NOTE — Therapy (Signed)
[facility] [address] [place], [place], [address] Phone: [phone]   Fax:  [phone]  Physical Therapy Treatment  Patient Details  Name: Brenda [name] MRN: [id number] Date of Birth: [birth year] Referring Provider: Colin Benton, DO   Encounter Date: 12/07/2017  PT End of Session - 12/07/17 2149    Visit Number  38    Number of Visits  42    Date for PT Re-Evaluation  12/22/17    Authorization Type  MCR    Authorization Time Period  06-04-17 - 08-03-17;  08-10-17 - 09-11-16:  09-10-17 - 10-11-17; 10-01-17 - 11-22-17; 11-23-17 - 01-23-18    PT Start Time  1103    PT Stop Time  1146    PT Time Calculation (min)  43 min    Equipment Utilized During Treatment  Gait belt       Past Medical History:  Diagnosis Date  . Arthritis   . Bronchitis    hx of when smoked   . Cancer ([place]) 04/16/09   kidney cancer - [facility] per her report released from f/u  . Cataracts, bilateral   . Diverticulitis 11/2004  . Endometriosis 1994  . GERD (gastroesophageal reflux disease)    hx hiatal hernia, hx esophageal stricture s/p dilation  . Headache   . Hepatitis   . History of chicken pox   . History of hiatal hernia   . History of measles   . History of mumps as a child   . Hx of hepatitis    with cirrhosis - per her report from [facility] and eval in 2013 at [facility]  . Hyperlipidemia   . Left ear pain   . Migraines    pt states vesticular migraines has dizziness in relation   . Numbness and tingling    feet bilat has had for 40 years  . Right knee pain   . Scarlet fever   . Shingles   . Urinary tract bacterial infections    hx of  . Vertigo    associated with headache, intermittent, chronic    Past Surgical History:  Procedure Laterality Date  . ABDOMINAL HYSTERECTOMY  1994   TAH/BSO due to endometriosis  . APPENDECTOMY    . CATARACT EXTRACTION W/ INTRAOCULAR LENS IMPLANT Bilateral 05/2015, 04/2015  .  DIAGNOSTIC LAPAROSCOPY    . LAPAROSCOPIC PARTIAL COLECTOMY N/A 04/10/2015   Procedure: LAPAROSCOPIC PARTIAL CECTOMY;  Surgeon: Ralene Ok, MD;  Location: [facility];  Service: General;  Laterality: N/A;  . LUNG BIOPSY  1996   Negative  . NEPHRECTOMY  04/16/09   Partial removal due to cancer  . OOPHORECTOMY    . TONSILLECTOMY    . TONSILLECTOMY      There were no vitals filed for this visit.  Subjective Assessment - 12/07/17 2144    Subjective  Pt reports more fogginess and pain on Rt side of head - in forehead region; maybe a migraine - states she isn't sure; reports she stayed in bed for 1/2 day on Saturday due to not feeling well    Pertinent History  idiopathic peripheral neuropathy; pt was prescribed medication for migraines but states medication did not help; pt has sensorineural hearing loss (initally labyrinthitis? with sequelae?); h/o kidney cancer; chronic headaches    Patient Stated Goals  "would like to be comfortable enough to not have to use cane and be self sufficient"  [facility] Adult PT Treatment/Exercise - 12/07/17 1132      Ambulation/Gait   Ambulation/Gait  Yes    Ambulation/Gait Assistance  5: Supervision    Ambulation Distance (Feet)  300 Feet    Assistive device  None    Gait Pattern  Within Functional Limits    Ambulation Surface  Level;Indoor    Gait Comments  Pt performed clockwise and counterclockwise with ball; "V" and "t" pattern for visual tracking      Knee/Hip Exercises: Aerobic   Nustep  Level 4 x 10" with UE's & LE's no charge as unsupervised          Balance Exercises - 12/07/17 2148      Balance Exercises: Standing   Standing Eyes Opened  Narrow base of support (BOS);Wide (BOA);Head turns;Foam/compliant surface;5 reps    Standing Eyes Closed  Narrow base of support (BOS);Wide (BOA);Foam/compliant surface;5 reps    Rockerboard  Anterior/posterior;Head turns;EO;10 reps    Partial Tandem Stance  Eyes open;5  reps;Foam/compliant surface    Turning  Right;Left;5 reps walking with turning/stopping    Other Standing Exercises  Pt performed marching with marching with head turns horizontal and vertical  on red mat with CGA             PT Long Term Goals - 12/01/17 1705      PT LONG TERM GOAL #4   Title  Pt will report at least 25% improvement in vertigo.    Target Date  12/22/17      PT LONG TERM GOAL #6   Title  Pt will report at least 50% improvement in vertigo.    Target Date  12/22/17      PT LONG TERM GOAL #7   Title  Pt will amb. 38' with horizontal head turns without moderate LOB with dizziness rating </= 3/10 intensity.    Target Date  12/22/17      PT LONG TERM GOAL  #9   TITLE  Pt will improve DHI score from 72% to </= 48% to demo improvement in vertigo. REVISED:  Improve DHI score from 84% on 10-01-17 to </ 60%      Baseline  72% 08-13-17;  84% on 10-01-17    Target Date  12/22/17      PT LONG TERM GOAL  #10   TITLE  Improve Lt SLS to >/= 6 secs     Target Date  12/22/17            Plan - 12/07/17 2150    Clinical Impression Statement  Pt reports dizziness approx. 6/10 intensity with balance/vestibular activities during today's session; pt stated that doing the activities was a struggle today    Rehab Potential  Good    PT Frequency  2x / week    PT Duration  4 weeks    PT Treatment/Interventions  ADLs/Self Care Home Management;Gait training;Stair training;Therapeutic activities;Therapeutic exercise;Balance training;Neuromuscular re-education;Patient/family education;Vestibular    PT Next Visit Plan  cont balance/vestibular exercises    PT Home Exercise Plan  x1 viewing; balance on foam added 05-19-17; added heel lifts and heel cord stretch on 06-04-17    Consulted and Agree with Plan of Care  Patient       Patient will benefit from skilled therapeutic intervention in order to improve the following deficits and impairments:  Abnormal gait, Decreased balance,  Difficulty walking, Dizziness  Visit Diagnosis: Unsteadiness on feet  Dizziness and giddiness     Problem List Patient Active Problem List  Diagnosis Date Noted  . S/P partial resection of colon 04/10/2015  . Cirrhosis ([place]) 12/08/2014  . Esophageal reflux 12/08/2014  . Esophageal stricture 12/08/2014  . Vertigo 12/08/2014    Alda Lea, PT 12/07/2017, 9:52 PM  [facility] [address] [place], [place], [address] Phone: [phone]   Fax:  [phone]  Name: Brenda [name] MRN: [id number] Date of Birth: 1946-06-30

## 2017-12-10 ENCOUNTER — Ambulatory Visit: Payer: Medicare Other | Admitting: Physical Therapy

## 2017-12-10 DIAGNOSIS — R2681 Unsteadiness on feet: Secondary | ICD-10-CM | POA: Diagnosis not present

## 2017-12-10 DIAGNOSIS — R42 Dizziness and giddiness: Secondary | ICD-10-CM

## 2017-12-10 DIAGNOSIS — R2689 Other abnormalities of gait and mobility: Secondary | ICD-10-CM | POA: Diagnosis not present

## 2017-12-11 ENCOUNTER — Encounter: Payer: Self-pay | Admitting: Physical Therapy

## 2017-12-11 NOTE — Therapy (Signed)
[facility] [address] [place], [place], [address] Phone: [phone]   Fax:  [phone]  Physical Therapy Treatment  Patient Details  Name: Brenda [name] MRN: [id number] Date of Birth: Jul 16, 1946 Referring Provider: Colin Benton, DO   Encounter Date: 12/10/2017  PT End of Session - 12/11/17 1130    Visit Number  39    Number of Visits  42    Date for PT Re-Evaluation  12/22/17    Authorization Type  MCR    Authorization Time Period  06-04-17 - 08-03-17;  08-10-17 - 09-11-16:  09-10-17 - 10-11-17; 10-01-17 - 11-22-17; 11-23-17 - 01-23-18    PT Start Time  0932    PT Stop Time  1017    PT Time Calculation (min)  45 min    Equipment Utilized During Treatment  Gait belt       Past Medical History:  Diagnosis Date  . Arthritis   . Bronchitis    hx of when smoked   . Cancer ([place]) 04/16/09   kidney cancer - tx by [facility] per her report released from f/u  . Cataracts, bilateral   . Diverticulitis 11/2004  . Endometriosis 1994  . GERD (gastroesophageal reflux disease)    hx hiatal hernia, hx esophageal stricture s/p dilation  . Headache   . Hepatitis   . History of chicken pox   . History of hiatal hernia   . History of measles   . History of mumps as a child   . Hx of hepatitis    with cirrhosis - per her report from [facility] and eval in 2013 at [facility]  . Hyperlipidemia   . Left ear pain   . Migraines    pt states vesticular migraines has dizziness in relation   . Numbness and tingling    feet bilat has had for 40 years  . Right knee pain   . Scarlet fever   . Shingles   . Urinary tract bacterial infections    hx of  . Vertigo    associated with headache, intermittent, chronic    Past Surgical History:  Procedure Laterality Date  . ABDOMINAL HYSTERECTOMY  1994   TAH/BSO due to endometriosis  . APPENDECTOMY    . CATARACT EXTRACTION W/ INTRAOCULAR LENS IMPLANT Bilateral 05/2015, 04/2015  .  DIAGNOSTIC LAPAROSCOPY    . LAPAROSCOPIC PARTIAL COLECTOMY N/A 04/10/2015   Procedure: LAPAROSCOPIC PARTIAL CECTOMY;  Surgeon: Ralene Ok, MD;  Location: [facility];  Service: General;  Laterality: N/A;  . LUNG BIOPSY  1996   Negative  . NEPHRECTOMY  04/16/09   Partial removal due to cancer  . OOPHORECTOMY    . TONSILLECTOMY    . TONSILLECTOMY      There were no vitals filed for this visit.  Subjective Assessment - 12/11/17 1124    Subjective  Pt reports no changes since previous session- continues to report fogginess and dizziness in head     Pertinent History  idiopathic peripheral neuropathy; pt was prescribed medication for migraines but states medication did not help; pt has sensorineural hearing loss (initally labyrinthitis? with sequelae?); h/o kidney cancer; chronic headaches    Patient Stated Goals  "would like to be comfortable enough to not have to use cane and be self sufficient"    Currently in Pain?  No/denies                       [facility] Adult PT Treatment/Exercise -  12/11/17 0001      Ambulation/Gait   Ambulation/Gait  Yes    Ambulation/Gait Assistance  5: Supervision    Ambulation Distance (Feet)  200 Feet    Assistive device  None    Gait Pattern  Within Functional Limits    Ambulation Surface  Level;Indoor    Gait Comments  Pt performed clockwise and counterclockwise with ball; "V" and "t" pattern for visual tracking      High Level Balance   High Level Balance Activities  Marching forwards;Marching backwards;Turns;Sudden stops      Knee/Hip Exercises: Aerobic   Nustep  Level 4 x 10" with UE's & LE's no charge as unsupervised          Balance Exercises - 12/11/17 1128      Balance Exercises: Standing   Standing Eyes Opened  Narrow base of support (BOS);Wide (BOA);Head turns;Foam/compliant surface;5 reps    Standing Eyes Closed  Narrow base of support (BOS);Wide (BOA);Foam/compliant surface;5 reps    SLS with Vectors  Foam/compliant  surface on Bosu- with // bars    Rockerboard  Anterior/posterior;Head turns;EO;10 reps    Turning  5 reps figure 8 with ball - 180 degree turns on mat    Other Standing Exercises  Forward, back and side kicks 10 reps each standing on blue mat with CGA              PT Long Term Goals - 12/01/17 1705      PT LONG TERM GOAL #4   Title  Pt will report at least 25% improvement in vertigo.    Target Date  12/22/17      PT LONG TERM GOAL #6   Title  Pt will report at least 50% improvement in vertigo.    Target Date  12/22/17      PT LONG TERM GOAL #7   Title  Pt will amb. 46' with horizontal head turns without moderate LOB with dizziness rating </= 3/10 intensity.    Target Date  12/22/17      PT LONG TERM GOAL  #9   TITLE  Pt will improve DHI score from 72% to </= 48% to demo improvement in vertigo. REVISED:  Improve DHI score from 84% on 10-01-17 to </ 60%      Baseline  72% 08-13-17;  84% on 10-01-17    Target Date  12/22/17      PT LONG TERM GOAL  #10   TITLE  Improve Lt SLS to >/= 6 secs     Target Date  12/22/17            Plan - 12/11/17 1131    Clinical Impression Statement  Pt reported less dizziness with vestibular/balance activities at intensity 5/10 today; pt's c/o dizziness with activities varries on daily basis but appears to be constant; pt is plateauing in functional progress - plan D/C in next scheduled week    Rehab Potential  Good    PT Frequency  2x / week    PT Duration  4 weeks    PT Treatment/Interventions  ADLs/Self Care Home Management;Gait training;Stair training;Therapeutic activities;Therapeutic exercise;Balance training;Neuromuscular re-education;Patient/family education;Vestibular    PT Next Visit Plan  begin checking LTG's    PT Home Exercise Plan  x1 viewing; balance on foam added 05-19-17; added heel lifts and heel cord stretch on 06-04-17    Consulted and Agree with Plan of Care  Patient       Patient will benefit from skilled therapeutic  intervention in order to improve the following deficits and impairments:  Abnormal gait, Decreased balance, Difficulty walking, Dizziness  Visit Diagnosis: Unsteadiness on feet  Dizziness and giddiness     Problem List Patient Active Problem List   Diagnosis Date Noted  . S/P partial resection of colon 04/10/2015  . Cirrhosis ([place]) 12/08/2014  . Esophageal reflux 12/08/2014  . Esophageal stricture 12/08/2014  . Vertigo 12/08/2014    Alda Lea, PT 12/11/2017, 11:37 AM  [facility] [address] [place], [place], [address] Phone: [phone]   Fax:  [phone]  Name: Brenda [name] MRN: [id number] Date of Birth: 1945-10-17

## 2017-12-14 ENCOUNTER — Telehealth: Payer: Self-pay | Admitting: *Deleted

## 2017-12-14 NOTE — Telephone Encounter (Signed)
I called the pt and informed her of the message below.  Appt scheduled for 4/25.

## 2017-12-14 NOTE — Telephone Encounter (Signed)
If has orders from [facility] - can go to a [facility] or outpt lab.  Otherwise schedule appt with me. Thanks.

## 2017-12-14 NOTE — Telephone Encounter (Signed)
Copied from [facility] [id number]. Topic: Quick Communication - See Telephone Encounter >> Dec 11, 2017 11:45 AM [place] Belt A wrote: CRM for notification. See Telephone encounter for: 12/11/17. Pt called in and said that her dr from [facility] said that pt needs labs to check your cholesterol.  The meds that she is one from [facility] effects her cholesterol.  She would like to know if she needs an appt for just labs put in ?     Best number  [phone]

## 2017-12-16 NOTE — Progress Notes (Signed)
HPI:  Using dictation device. Unfortunately this device frequently misinterprets words/phrases.  Follow up hyperlipidemia: -advised starting statin 04/2017 -she did not tolerate crestor - caused heartburn -tolerating pravastatin fine  Seeing Specialist at [facility] for chronic vertigo: -currently taking effexor for this -doin rehab -was doing OMT as well -was doing better, but worse the last month - mom in hospice and lots of stress, has good faith and support but tough  Hx drug induced liver damage: -seeing [name] for management - reports seeing him this summer  ROS: See pertinent positives and negatives per HPI.  Past Medical History:  Diagnosis Date  . Arthritis   . Bronchitis    hx of when smoked   . Cancer ([place]) 04/16/09   kidney cancer - tx by [facility] per her report released from f/u  . Cataracts, bilateral   . Diverticulitis 11/2004  . Endometriosis 1994  . GERD (gastroesophageal reflux disease)    hx hiatal hernia, hx esophageal stricture s/p dilation  . Headache   . Hepatitis   . History of chicken pox   . History of hiatal hernia   . History of measles   . History of mumps as a child   . Hx of hepatitis    with cirrhosis - per her report from [place] and eval in 2013 at [place]  . Hyperlipidemia   . Left ear pain   . Migraines    pt states vesticular migraines has dizziness in relation   . Numbness and tingling    feet bilat has had for 40 years  . Right knee pain   . Scarlet fever   . Shingles   . Urinary tract bacterial infections    hx of  . Vertigo    associated with headache, intermittent, chronic    Past Surgical History:  Procedure Laterality Date  . ABDOMINAL HYSTERECTOMY  1994   TAH/BSO due to endometriosis  . APPENDECTOMY    . CATARACT EXTRACTION W/ INTRAOCULAR LENS IMPLANT Bilateral 05/2015, 04/2015  . DIAGNOSTIC LAPAROSCOPY    . LAPAROSCOPIC PARTIAL COLECTOMY N/A 04/10/2015   Procedure: LAPAROSCOPIC PARTIAL CECTOMY;  Surgeon:  Ralene Ok, MD;  Location: [facility];  Service: General;  Laterality: N/A;  . LUNG BIOPSY  1996   Negative  . NEPHRECTOMY  04/16/09   Partial removal due to cancer  . OOPHORECTOMY    . TONSILLECTOMY    . TONSILLECTOMY      Family History  Problem Relation Age of Onset  . Cancer Father   . Thyroid cancer Mother   . Cancer Mother        thyroid/ renal cell ca   . Colon cancer Neg Hx     SOCIAL HX: see above   Current Outpatient Medications:  .  cycloSPORINE (RESTASIS) 0.05 % ophthalmic emulsion, Apply to eye 2 (two) times daily., Disp: , Rfl:  .  Estradiol (YUVAFEM) 10 MCG TABS vaginal tablet, USE TWICE WEEKLY AS DIRECTED, Disp: 24 tablet, Rfl: 3 .  Multiple Vitamin (MULTIVITAMIN) tablet, Take 1 tablet by mouth daily.  , Disp: , Rfl:  .  omeprazole (PRILOSEC) 20 MG capsule, Take 1 capsule by mouth daily., Disp: , Rfl:  .  pravastatin (PRAVACHOL) 20 MG tablet, Take 1 tablet (20 mg total) by mouth daily., Disp: 90 tablet, Rfl: 3 .  venlafaxine XR (EFFEXOR-XR) 75 MG 24 hr capsule, Take 75 mg by mouth daily., Disp: , Rfl: 3  EXAM:  Vitals:   12/17/17 0935  BP: 120/82  Pulse: 78  Temp: 97.7 F (36.5 C)    Body mass index is 26.55 kg/m.  GENERAL: vitals reviewed and listed above, alert, oriented, appears well hydrated and in no acute distress  HEENT: atraumatic, conjunttiva clear, no obvious abnormalities on inspection of external nose and ears  NECK: no obvious masses on inspection  LUNGS: clear to auscultation bilaterally, no wheezes, rales or rhonchi, good air movement  CV: HRRR, no peripheral edema  MS: moves all extremities without noticeable abnormality  PSYCH: pleasant and cooperative, no obvious depression or anxiety  ASSESSMENT AND PLAN:  Discussed the following assessment and plan:  Hyperlipidemia, unspecified hyperlipidemia type  Vertigo  Elevated blood pressure reading  Cirrhosis of liver without ascites, unspecified hepatic cirrhosis type  ([place])  -labs -she feels BP is up due to stress with mom in hospice, soon to pass - monitor, recheck at [facility] -seeing GI and neurologist  -discussed bereavement counseling, she has information if needs -CPE/F/u in 3-4 months   Patient Instructions  BEFORE YOU LEAVE: -labs -follow up: AWV with [name] and follow up with Dr. Maudie Mercury in 3-4 months  We have ordered labs or studies at this visit. It can take up to 1-2 weeks for results and processing. IF results require follow up or explanation, we will call you with instructions. Clinically stable results will be released to your [facility]. If you have not heard from [place] or cannot find your results in [facility] in 2 weeks please contact our office at [phone].  If you are not yet signed up for [facility], please consider signing up.  Hang in there.          Lucretia Kern, DO

## 2017-12-17 ENCOUNTER — Encounter: Payer: Self-pay | Admitting: Family Medicine

## 2017-12-17 ENCOUNTER — Ambulatory Visit (INDEPENDENT_AMBULATORY_CARE_PROVIDER_SITE_OTHER): Payer: Medicare Other | Admitting: Family Medicine

## 2017-12-17 VITALS — BP 120/82 | HR 78 | Temp 97.7°F | Ht 63.25 in | Wt 151.1 lb

## 2017-12-17 DIAGNOSIS — K7460 Unspecified cirrhosis of liver: Secondary | ICD-10-CM | POA: Diagnosis not present

## 2017-12-17 DIAGNOSIS — E785 Hyperlipidemia, unspecified: Secondary | ICD-10-CM

## 2017-12-17 DIAGNOSIS — R030 Elevated blood-pressure reading, without diagnosis of hypertension: Secondary | ICD-10-CM

## 2017-12-17 DIAGNOSIS — R42 Dizziness and giddiness: Secondary | ICD-10-CM | POA: Diagnosis not present

## 2017-12-17 LAB — LIPID PANEL
CHOL/HDL RATIO: 3
Cholesterol: 168 mg/dL (ref 0–200)
HDL: 52 mg/dL (ref >39.00)
LDL CALC: 98 mg/dL (ref 0–99)
NONHDL: 116.44
TRIGLYCERIDES: 92 mg/dL (ref 0.0–149.0)
VLDL: 18.4 mg/dL (ref 0.0–40.0)

## 2017-12-17 NOTE — Patient Instructions (Signed)
BEFORE YOU LEAVE: -labs -follow up: AWV with [name] and follow up with Dr. Maudie Mercury in 3-4 months  We have ordered labs or studies at this visit. It can take up to 1-2 weeks for results and processing. IF results require follow up or explanation, we will call you with instructions. Clinically stable results will be released to your [facility]. If you have not heard from [place] or cannot find your results in [facility] in 2 weeks please contact our office at [phone].  If you are not yet signed up for [facility], please consider signing up.  Hang in there.

## 2017-12-21 ENCOUNTER — Ambulatory Visit: Payer: Medicare Other | Admitting: Physical Therapy

## 2017-12-22 ENCOUNTER — Encounter: Payer: Medicare Other | Admitting: Physical Therapy

## 2018-01-26 DIAGNOSIS — I8500 Esophageal varices without bleeding: Secondary | ICD-10-CM | POA: Diagnosis not present

## 2018-01-26 DIAGNOSIS — Z13810 Encounter for screening for upper gastrointestinal disorder: Secondary | ICD-10-CM | POA: Diagnosis not present

## 2018-01-26 DIAGNOSIS — K449 Diaphragmatic hernia without obstruction or gangrene: Secondary | ICD-10-CM | POA: Diagnosis not present

## 2018-01-26 DIAGNOSIS — K649 Unspecified hemorrhoids: Secondary | ICD-10-CM | POA: Diagnosis not present

## 2018-01-26 DIAGNOSIS — Z711 Person with feared health complaint in whom no diagnosis is made: Secondary | ICD-10-CM | POA: Diagnosis not present

## 2018-01-26 DIAGNOSIS — Z86010 Personal history of colonic polyps: Secondary | ICD-10-CM | POA: Diagnosis not present

## 2018-01-26 DIAGNOSIS — K5730 Diverticulosis of large intestine without perforation or abscess without bleeding: Secondary | ICD-10-CM | POA: Diagnosis not present

## 2018-01-29 DIAGNOSIS — Z711 Person with feared health complaint in whom no diagnosis is made: Secondary | ICD-10-CM | POA: Diagnosis not present

## 2018-03-26 ENCOUNTER — Other Ambulatory Visit: Payer: Self-pay

## 2018-04-12 ENCOUNTER — Other Ambulatory Visit: Payer: Self-pay | Admitting: Gastroenterology

## 2018-04-12 DIAGNOSIS — K7460 Unspecified cirrhosis of liver: Secondary | ICD-10-CM

## 2018-04-16 DIAGNOSIS — R42 Dizziness and giddiness: Secondary | ICD-10-CM | POA: Diagnosis not present

## 2018-04-19 ENCOUNTER — Ambulatory Visit
Admission: RE | Admit: 2018-04-19 | Discharge: 2018-04-19 | Disposition: A | Discharge status: Home / Self Care | Payer: Medicare Other | Source: Ambulatory Visit | Attending: Gastroenterology | Admitting: Gastroenterology

## 2018-04-19 DIAGNOSIS — K7460 Unspecified cirrhosis of liver: Secondary | ICD-10-CM

## 2018-04-19 NOTE — Progress Notes (Deleted)
HPI:  Using dictation device. Unfortunately this device frequently misinterprets words/phrases.  Brenda [name] is a pleasant 72 y.o. here for follow up. Chronic medical problems summarized below were reviewed for changes and stability and were updated as needed below. These issues and their treatment remain stable for the most part. ***. Denies CP, SOB, DOE, treatment intolerance or new symptoms. Due for flu shot in October with AWV  Follow up hyperlipidemia: -advised starting statin 04/2017 -she did not tolerate crestor - caused heartburn -tolerating pravastatin fine  Seeing Specialist at [facility] for chronic vertigo: -currently taking effexor for this -doin rehab -was doing OMT as well -was doing better, but worse the last month - mom in hospice and lots of stress, has good faith and support but tough  Hx drug induced liver damage: -seeing [name] for management - reports seeing him this summer  ROS: See pertinent positives and negatives per HPI.  Past Medical History:  Diagnosis Date  . Arthritis   . Bronchitis    hx of when smoked   . Cancer ([place]) 04/16/09   kidney cancer - [facility] per her report released from f/u  . Cataracts, bilateral   . Diverticulitis 11/2004  . Endometriosis 1994  . GERD (gastroesophageal reflux disease)    hx hiatal hernia, hx esophageal stricture s/p dilation  . Headache   . Hepatitis   . History of chicken pox   . History of hiatal hernia   . History of measles   . History of mumps as a child   . Hx of hepatitis    with cirrhosis - per her report from [place] and eval in 2013 at [place]  . Hyperlipidemia   . Left ear pain   . Migraines    pt states vesticular migraines has dizziness in relation   . Numbness and tingling    feet bilat has had for 40 years  . Right knee pain   . Scarlet fever   . Shingles   . Urinary tract bacterial infections    hx of  . Vertigo    associated with headache, intermittent, chronic     Past Surgical History:  Procedure Laterality Date  . ABDOMINAL HYSTERECTOMY  1994   TAH/BSO due to endometriosis  . APPENDECTOMY    . CATARACT EXTRACTION W/ INTRAOCULAR LENS IMPLANT Bilateral 05/2015, 04/2015  . DIAGNOSTIC LAPAROSCOPY    . LAPAROSCOPIC PARTIAL COLECTOMY N/A 04/10/2015   Procedure: LAPAROSCOPIC PARTIAL CECTOMY;  Surgeon: Ralene Ok, MD;  Location: [facility];  Service: General;  Laterality: N/A;  . LUNG BIOPSY  1996   Negative  . NEPHRECTOMY  04/16/09   Partial removal due to cancer  . OOPHORECTOMY    . TONSILLECTOMY    . TONSILLECTOMY      Family History  Problem Relation Age of Onset  . Cancer Father   . Thyroid cancer Mother   . Cancer Mother        thyroid/ renal cell ca   . Colon cancer Neg Hx     SOCIAL HX: ***   Current Outpatient Medications:  .  cycloSPORINE (RESTASIS) 0.05 % ophthalmic emulsion, Apply to eye 2 (two) times daily., Disp: , Rfl:  .  Estradiol (YUVAFEM) 10 MCG TABS vaginal tablet, USE TWICE WEEKLY AS DIRECTED, Disp: 24 tablet, Rfl: 3 .  Multiple Vitamin (MULTIVITAMIN) tablet, Take 1 tablet by mouth daily.  , Disp: , Rfl:  .  omeprazole (PRILOSEC) 20 MG capsule, Take 1 capsule by mouth  daily., Disp: , Rfl:  .  pravastatin (PRAVACHOL) 20 MG tablet, Take 1 tablet (20 mg total) by mouth daily., Disp: 90 tablet, Rfl: 3 .  venlafaxine XR (EFFEXOR-XR) 75 MG 24 hr capsule, Take 75 mg by mouth daily., Disp: , Rfl: 3  EXAM:  There were no vitals filed for this visit.  There is no height or weight on file to calculate BMI.  GENERAL: vitals reviewed and listed above, alert, oriented, appears well hydrated and in no acute distress  HEENT: atraumatic, conjunttiva clear, no obvious abnormalities on inspection of external nose and ears  NECK: no obvious masses on inspection  LUNGS: clear to auscultation bilaterally, no wheezes, rales or rhonchi, good air movement  CV: HRRR, no peripheral edema  MS: moves all extremities without  noticeable abnormality *** PSYCH: pleasant and cooperative, no obvious depression or anxiety  ASSESSMENT AND PLAN:  Discussed the following assessment and plan:  No diagnosis found.  *** -Patient advised to return or notify a doctor immediately if symptoms worsen or persist or new concerns arise.  There are no Patient Instructions on file for this visit.  Lucretia Kern, DO

## 2018-04-19 NOTE — Progress Notes (Deleted)
Subjective:   Brenda [name] is a 72 y.o. female who presents for Medicare Annual (Subsequent) preventive examination.  Reports health as  Diet Chol/hd 3 A1c 5.7    Exercise  Former smoker 92; 30 pack years   Health Maintenance Due  Topic Date Due  . INFLUENZA VACCINE  03/25/2018   Colonoscopy 04/2016 Mammogram 05/2017 10/2009 dexa neg  Educated regarding shigrix          Objective:     Vitals: LMP 08/25/1992   There is no height or weight on file to calculate BMI.  Advanced Directives 05/06/2017 05/05/2017 04/10/2015 04/10/2015 04/06/2015 12/22/2014  Does Patient Have a Medical Advance Directive? Yes Yes Yes Yes Yes Yes  Type of Advance Directive Living will - [place];Living will [place];Living will [place];Living will [place];Living will;Mental Health Advance Directive;Advance instruction for mental health treatment;Out of facility DNR (pink MOST or yellow form)  Does patient want to make changes to medical advance directive? - - No - Patient declined No - Patient declined No - Patient declined -  Copy of [place] in Chart? - - No - copy requested No - copy requested No - copy requested No - copy requested    Tobacco Social History   Tobacco Use  Smoking Status Former Smoker  . Packs/day: 1.50  . Years: 20.00  . Pack years: 30.00  . Types: Cigarettes  . Last attempt to quit: 08/25/1990  . Years since quitting: 27.6  Smokeless Tobacco Never Used     Counseling given: Not Answered   Clinical Intake:    Past Medical History:  Diagnosis Date  . Arthritis   . Bronchitis    hx of when smoked   . Cancer ([place]) 04/16/09   kidney cancer - [facility] per her report released from f/u  . Cataracts, bilateral   . Diverticulitis 11/2004  . Endometriosis 1994  . GERD (gastroesophageal reflux disease)    hx hiatal hernia, hx esophageal stricture s/p  dilation  . Headache   . Hepatitis   . History of chicken pox   . History of hiatal hernia   . History of measles   . History of mumps as a child   . Hx of hepatitis    with cirrhosis - per her report from [place] and eval in 2013 at [place]  . Hyperlipidemia   . Left ear pain   . Migraines    pt states vesticular migraines has dizziness in relation   . Numbness and tingling    feet bilat has had for 40 years  . Right knee pain   . Scarlet fever   . Shingles   . Urinary tract bacterial infections    hx of  . Vertigo    associated with headache, intermittent, chronic   Past Surgical History:  Procedure Laterality Date  . ABDOMINAL HYSTERECTOMY  1994   TAH/BSO due to endometriosis  . APPENDECTOMY    . CATARACT EXTRACTION W/ INTRAOCULAR LENS IMPLANT Bilateral 05/2015, 04/2015  . DIAGNOSTIC LAPAROSCOPY    . LAPAROSCOPIC PARTIAL COLECTOMY N/A 04/10/2015   Procedure: LAPAROSCOPIC PARTIAL CECTOMY;  Surgeon: Ralene Ok, MD;  Location: [facility];  Service: General;  Laterality: N/A;  . LUNG BIOPSY  1996   Negative  . NEPHRECTOMY  04/16/09   Partial removal due to cancer  . OOPHORECTOMY    . TONSILLECTOMY    . TONSILLECTOMY  Family History  Problem Relation Age of Onset  . Cancer Father   . Thyroid cancer Mother   . Cancer Mother        thyroid/ renal cell ca   . Colon cancer Neg Hx    Social History   Socioeconomic History  . Marital status: Married    Spouse name: Not on file  . Number of children: 0  . Years of education: Not on file  . Highest education level: Not on file  Occupational History  . Occupation: Retired    Fish farm manager: RETIRED  Social Needs  . Financial resource strain: Not on file  . Food insecurity:    Worry: Not on file    Inability: Not on file  . Transportation needs:    Medical: Not on file    Non-medical: Not on file  Tobacco Use  . Smoking status: Former Smoker    Packs/day: 1.50    Years: 20.00    Pack years: 30.00    Types:  Cigarettes    Last attempt to quit: 08/25/1990    Years since quitting: 27.6  . Smokeless tobacco: Never Used  Substance and Sexual Activity  . Alcohol use: No    Comment: Rare  . Drug use: No  . Sexual activity: Never    Partners: Male    Birth control/protection: Surgical, Post-menopausal    Comment: TAH  Lifestyle  . Physical activity:    Days per week: Not on file    Minutes per session: Not on file  . Stress: Not on file  Relationships  . Social connections:    Talks on phone: Not on file    Gets together: Not on file    Attends religious service: Not on file    Active member of club or organization: Not on file    Attends meetings of clubs or organizations: Not on file    Relationship status: Not on file  Other Topics Concern  . Not on file  Social History Narrative  . Not on file    Outpatient Encounter Medications as of 04/20/2018  Medication Sig  . cycloSPORINE (RESTASIS) 0.05 % ophthalmic emulsion Apply to eye 2 (two) times daily.  . Estradiol (YUVAFEM) 10 MCG TABS vaginal tablet USE TWICE WEEKLY AS DIRECTED  . Multiple Vitamin (MULTIVITAMIN) tablet Take 1 tablet by mouth daily.    Marland Kitchen omeprazole (PRILOSEC) 20 MG capsule Take 1 capsule by mouth daily.  . pravastatin (PRAVACHOL) 20 MG tablet Take 1 tablet (20 mg total) by mouth daily.  Marland Kitchen venlafaxine XR (EFFEXOR-XR) 75 MG 24 hr capsule Take 75 mg by mouth daily.   No facility-administered encounter medications on file as of 04/20/2018.     Activities of Daily Living No flowsheet data found.  Patient Care Team: Lucretia Kern, DO as PCP - General (Family Medicine) Arta Silence, MD as Consulting Physician (Gastroenterology) Pieter Partridge, DO as Consulting Physician (Neurology) Romine, Lubertha South, MD as Consulting Physician (Obstetrics and Gynecology) Rutherford Guys, MD as Consulting Physician (Ophthalmology) Lynda Rainwater, DDS as Consulting Physician (Dentistry)    Assessment:   This is a routine wellness  examination for Brenda [name].  Exercise Activities and Dietary recommendations    Goals   None     Fall Risk Fall Risk  10/21/2016 11/23/2015 03/26/2015 12/08/2014  Falls in the past year? No No No No     Depression Screen PHQ 2/9 Scores 10/21/2016 03/26/2015 12/08/2014  PHQ - 2 Score 0 0 0  Cognitive Function   Ad8 score reviewed for issues:  Issues making decisions:  Less interest in hobbies / activities:  Repeats questions, stories (family complaining):  Trouble using ordinary gadgets (microwave, computer, phone):  Forgets the month or year:   Mismanaging finances:   Remembering appts:  Daily problems with thinking and/or memory: Ad8 score is=          Immunization History  Administered Date(s) Administered  . Influenza Split 06/26/2011, 05/18/2012  . Influenza Whole 05/06/2009  . Influenza, High Dose Seasonal PF 05/17/2015, 05/19/2017  . Influenza-Unspecified 05/25/2016  . Pneumococcal Conjugate-13 08/26/2007  . Pneumococcal Polysaccharide-23 08/20/2011  . Tdap 08/21/2011  . Zoster 06/26/2011     Screening Tests Health Maintenance  Topic Date Due  . INFLUENZA VACCINE  03/25/2018  . MAMMOGRAM  06/17/2018  . COLONOSCOPY  05/24/2019  . TETANUS/TDAP  08/20/2021  . DEXA SCAN  Completed  . Hepatitis C Screening  Completed  . PNA vac Low Risk Adult  Completed        Plan:      PCP Notes ***  Health Maintenance ***  Abnormal Screens  ***  Referrals  ***  Patient concerns; ***  Nurse Concerns; ***  Next PCP apt ***      I have personally reviewed and noted the following in the patient's chart:   . Medical and social history . Use of alcohol, tobacco or illicit drugs  . Current medications and supplements . Functional ability and status . Nutritional status . Physical activity . Advanced directives . List of other physicians . Hospitalizations, surgeries, and ER visits in previous 12 months . Vitals . Screenings to include  cognitive, depression, and falls . Referrals and appointments  In addition, I have reviewed and discussed with patient certain preventive protocols, quality metrics, and best practice recommendations. A written personalized care plan for preventive services as well as general preventive health recommendations were provided to patient.     Wynetta Fines, RN  04/19/2018

## 2018-04-20 ENCOUNTER — Ambulatory Visit: Payer: Medicare Other

## 2018-04-20 ENCOUNTER — Ambulatory Visit: Payer: Medicare Other | Admitting: Family Medicine

## 2018-04-23 ENCOUNTER — Other Ambulatory Visit: Payer: Self-pay | Admitting: Obstetrics and Gynecology

## 2018-04-23 DIAGNOSIS — Z1231 Encounter for screening mammogram for malignant neoplasm of breast: Secondary | ICD-10-CM

## 2018-05-04 ENCOUNTER — Ambulatory Visit: Payer: Medicare Other | Admitting: Family Medicine

## 2018-05-12 ENCOUNTER — Ambulatory Visit: Payer: Medicare Other

## 2018-05-13 ENCOUNTER — Ambulatory Visit: Payer: Medicare Other | Admitting: Family Medicine

## 2018-05-19 ENCOUNTER — Ambulatory Visit: Payer: Medicare Other

## 2018-05-20 NOTE — Progress Notes (Signed)
HPI:  Using dictation device. Unfortunately this device frequently misinterprets words/phrases.  Brenda [name] is a pleasant 72 y.o. here for follow up. Chronic medical problems summarized below were reviewed for changes and stability and were updated as needed below. These issues and their treatment remain stable for the most part.  Reports doing well for the most part. Continued gait issues though better. Now walks without a cane and her neurologist let her get back to driving and she has not had any issues with driving. Continues effexor. Plans to change this rx over to here. Denies CP, SOB, DOE, treatment intolerance or new symptoms.reports did [place] and labs with liver specialist and lipids recently with NEurologist. Sees gyn, [name], uses vaginal estrogen  Has AWV with susan today. Due for flu shot   Follow up hyperlipidemia: -advised starting statin 04/2017 -she did not tolerate crestor - caused heartburn -tolerating pravastatin fine  Seeing Specialist at [facility] for chronic vertigo: -currently taking effexor for this -did gait/vestibular rehab  -was doing OMT as well -stress with mom in hospice -had extensive eval with [facility] with multiple specialist, they old her no further work up needed, ok to drive  Hx drug induced liver damage: -seeing [name] for management - reports seeing him this summer   ROS: See pertinent positives and negatives per HPI.  Past Medical History:  Diagnosis Date  . Arthritis   . Bronchitis    hx of when smoked   . Cancer ([place]) 04/16/09   kidney cancer - tx by [facility] per her report released from f/u  . Cataracts, bilateral   . Diverticulitis 11/2004  . Endometriosis 1994  . GERD (gastroesophageal reflux disease)    hx hiatal hernia, hx esophageal stricture s/p dilation  . Headache   . Hepatitis   . History of chicken pox   . History of hiatal hernia   . History of measles   . History of mumps as a child   . Hx of hepatitis    with cirrhosis - per her report from [place] and eval in 2013 at [place]  . Hyperlipidemia   . Left ear pain   . Migraines    pt states vesticular migraines has dizziness in relation   . Numbness and tingling    feet bilat has had for 40 years  . Right knee pain   . Scarlet fever   . Shingles   . Urinary tract bacterial infections    hx of  . Vertigo    associated with headache, intermittent, chronic    Past Surgical History:  Procedure Laterality Date  . ABDOMINAL HYSTERECTOMY  1994   TAH/BSO due to endometriosis  . APPENDECTOMY    . CATARACT EXTRACTION W/ INTRAOCULAR LENS IMPLANT Bilateral 05/2015, 04/2015  . DIAGNOSTIC LAPAROSCOPY    . LAPAROSCOPIC PARTIAL COLECTOMY N/A 04/10/2015   Procedure: LAPAROSCOPIC PARTIAL CECTOMY;  Surgeon: Ralene Ok, MD;  Location: [facility];  Service: General;  Laterality: N/A;  . LUNG BIOPSY  1996   Negative  . NEPHRECTOMY  04/16/09   Partial removal due to cancer  . OOPHORECTOMY    . TONSILLECTOMY    . TONSILLECTOMY      Family History  Problem Relation Age of Onset  . Cancer Father   . Thyroid cancer Mother   . Cancer Mother        thyroid/ renal cell ca   . Colon cancer Neg Hx     SOCIAL HX: see hpi   Current  Outpatient Medications:  .  cycloSPORINE (RESTASIS) 0.05 % ophthalmic emulsion, Apply to eye 2 (two) times daily., Disp: , Rfl:  .  Estradiol (YUVAFEM) 10 MCG TABS vaginal tablet, USE TWICE WEEKLY AS DIRECTED, Disp: 24 tablet, Rfl: 3 .  Multiple Vitamin (MULTIVITAMIN) tablet, Take 1 tablet by mouth daily.  , Disp: , Rfl:  .  omeprazole (PRILOSEC) 20 MG capsule, Take 1 capsule by mouth daily., Disp: , Rfl:  .  pravastatin (PRAVACHOL) 20 MG tablet, Take 1 tablet (20 mg total) by mouth daily., Disp: 90 tablet, Rfl: 3 .  venlafaxine XR (EFFEXOR-XR) 75 MG 24 hr capsule, Take 75 mg by mouth daily., Disp: , Rfl: 3  EXAM:  Vitals:   05/24/18 1033  BP: 140/70  Pulse: 79  Temp: 97.6 F (36.4 C)    Body mass index is 27.29  kg/m.  GENERAL: vitals reviewed and listed above, alert, oriented, appears well hydrated and in no acute distress  HEENT: atraumatic, conjunttiva clear, no obvious abnormalities on inspection of external nose and ears  NECK: no obvious masses on inspection  LUNGS: clear to auscultation bilaterally, no wheezes, rales or rhonchi, good air movement  CV: HRRR, no peripheral edema  MS: moves all extremities without noticeable abnormality  PSYCH: pleasant and cooperative, no obvious depression or anxiety  ASSESSMENT AND PLAN:  Discussed the following assessment and plan:  Vertigo -cont effexor -fall precuations -advised caution with driving and to not drive if having vertigo  Hyperlipidemia, unspecified hyperlipidemia type -cont pravastatin, reports tolerating well  Hepatitis -sees [name] for management  -Patient advised to return or notify a doctor immediately if symptoms worsen or persist or new concerns arise.  Patient Instructions  BEFORE YOU LEAVE: -AWV with [name] -depression screening in epic -follow up: 6 months   We recommend the following healthy lifestyle for LIFE: 1) Small portions. But, make sure to get regular (at least 3 per day), healthy meals and small healthy snacks if needed.  2) Eat a healthy clean diet.   TRY TO EAT: -at least 5-7 servings of low sugar, colorful, and nutrient rich vegetables per day (not corn, potatoes or bananas.) -berries are the best choice if you wish to eat fruit (only eat small amounts if trying to reduce weight)  -lean meets (fish, white meat of chicken or [place]) -vegan proteins for some meals - beans or tofu, whole grains, nuts and seeds -Replace bad fats with good fats - good fats include: fish, nuts and seeds, canola oil, olive oil -small amounts of low fat or non fat dairy -small amounts of100 % whole grains - check the lables -drink plenty of water  AVOID: -SUGAR, sweets, anything with added sugar, corn syrup or  sweeteners - must read labels as even foods advertised as "healthy" often are loaded with sugar -if you must have a sweetener, small amounts of stevia may be best -sweetened beverages and artificially sweetened beverages -simple starches (rice, bread, potatoes, pasta, chips, etc - small amounts of 100% whole grains are ok) -red meat, pork, butter -fried foods, fast food, processed food, excessive dairy, eggs and coconut.  3)Get at least 150 minutes of sweaty aerobic exercise per week.  4)Reduce stress - consider counseling, meditation and relaxation to balance other aspects of your life.     Lucretia Kern, DO

## 2018-05-23 NOTE — Progress Notes (Signed)
Subjective:   Brenda [name] is a 72 y.o. female who presents for Medicare Annual (Subsequent) preventive examination.  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   Colonoscopy 04/2016 - q 3 years due again in 04/2019 [name]  Mammogram 05/2017 annually; scheduled for next month  Dexa 10/2009 - normal       Objective:     Vitals: BP 140/70   Pulse 79   Ht 5\' 3"  (1.6 m)   Wt 155 lb (70.3 kg)   LMP 08/25/1992   BMI 27.46 kg/m   Body mass index is 27.46 kg/m.  Advanced Directives 05/24/2018 05/06/2017 05/05/2017 04/10/2015 04/10/2015 04/06/2015 12/22/2014  Does Patient Have a Medical Advance Directive? Yes Yes Yes Yes Yes Yes Yes  Type of Advance Directive - Living will - [place];Living will [place];Living will [place];Living will [place];Living will;Mental Health Advance Directive;Advance instruction for mental health treatment;Out of facility DNR (pink MOST or yellow form)  Does patient want to make changes to medical advance directive? - - - No - Patient declined No - Patient declined No - Patient declined -  Copy of [place] in Chart? - - - No - copy requested No - copy requested No - copy requested No - copy requested    Tobacco Social History   Tobacco Use  Smoking Status Former Smoker  . Packs/day: 1.50  . Years: 20.00  . Pack  years: 30.00  . Types: Cigarettes  . Last attempt to quit: 08/25/1990  . Years since quitting: 27.7  Smokeless Tobacco Never Used     Counseling given: Yes   Clinical Intake:  Past Medical History:  Diagnosis Date  . Arthritis   . Bronchitis    hx of when smoked   . Cancer ([place]) 04/16/09   kidney cancer - [facility] per her report released from f/u  . Cataracts, bilateral   . Diverticulitis 11/2004  . Endometriosis 1994  . GERD (gastroesophageal reflux disease)    hx hiatal hernia, hx esophageal stricture s/p dilation  . Headache   . Hepatitis   . History of chicken pox   . History of hiatal hernia   . History of measles   . History of mumps as a child   . Hx of hepatitis    with cirrhosis - per her report from [place] and eval in 2013 at [place]  . Hyperlipidemia   . Left ear pain   . Migraines    pt states vesticular migraines has dizziness in relation   . Numbness and tingling    feet bilat has had for 40 years  . Right knee pain   . Scarlet fever   . Shingles   . Urinary tract bacterial infections    hx of  . Vertigo  associated with headache, intermittent, chronic   Past Surgical History:  Procedure Laterality Date  . ABDOMINAL HYSTERECTOMY  1994   TAH/BSO due to endometriosis  . APPENDECTOMY    . CATARACT EXTRACTION W/ INTRAOCULAR LENS IMPLANT Bilateral 05/2015, 04/2015  . DIAGNOSTIC LAPAROSCOPY    . LAPAROSCOPIC PARTIAL COLECTOMY N/A 04/10/2015   Procedure: LAPAROSCOPIC PARTIAL CECTOMY;  Surgeon: Ralene Ok, MD;  Location: [facility];  Service: General;  Laterality: N/A;  . LUNG BIOPSY  1996   Negative  . NEPHRECTOMY  04/16/09   Partial removal due to cancer  . OOPHORECTOMY    . TONSILLECTOMY    . TONSILLECTOMY     Family History  Problem Relation Age of Onset  . Cancer Father   . Thyroid cancer Mother   . Cancer Mother        thyroid/ renal cell ca   . Colon cancer Neg Hx    Social History   Socioeconomic History  . Marital  status: Married    Spouse name: Not on file  . Number of children: 0  . Years of education: Not on file  . Highest education level: Not on file  Occupational History  . Occupation: Retired    Fish farm manager: RETIRED  Social Needs  . Financial resource strain: Not on file  . Food insecurity:    Worry: Not on file    Inability: Not on file  . Transportation needs:    Medical: Not on file    Non-medical: Not on file  Tobacco Use  . Smoking status: Former Smoker    Packs/day: 1.50    Years: 20.00    Pack years: 30.00    Types: Cigarettes    Last attempt to quit: 08/25/1990    Years since quitting: 27.7  . Smokeless tobacco: Never Used  Substance and Sexual Activity  . Alcohol use: No    Comment: Rare  . Drug use: No  . Sexual activity: Never    Partners: Male    Birth control/protection: Surgical, Post-menopausal    Comment: TAH  Lifestyle  . Physical activity:    Days per week: Not on file    Minutes per session: Not on file  . Stress: Not on file  Relationships  . Social connections:    Talks on phone: Not on file    Gets together: Not on file    Attends religious service: Not on file    Active member of club or organization: Not on file    Attends meetings of clubs or organizations: Not on file    Relationship status: Not on file  Other Topics Concern  . Not on file  Social History Narrative  . Not on file    Outpatient Encounter Medications as of 05/24/2018  Medication Sig  . cycloSPORINE (RESTASIS) 0.05 % ophthalmic emulsion Apply to eye 2 (two) times daily.  . Estradiol (YUVAFEM) 10 MCG TABS vaginal tablet USE TWICE WEEKLY AS DIRECTED  . Multiple Vitamin (MULTIVITAMIN) tablet Take 1 tablet by mouth daily.    Marland Kitchen omeprazole (PRILOSEC) 20 MG capsule Take 1 capsule by mouth daily.  . pravastatin (PRAVACHOL) 20 MG tablet Take 1 tablet (20 mg total) by mouth daily.  Marland Kitchen venlafaxine XR (EFFEXOR-XR) 75 MG 24 hr capsule Take 75 mg by mouth daily.   No facility-administered  encounter medications on file as of 05/24/2018.     Activities of Daily Living In your present state of health, do you have any difficulty performing the following activities: 05/24/2018  Hearing? N  Vision? N  Difficulty concentrating or making decisions? N  Walking or climbing stairs? N  Dressing or bathing? N  Doing errands, shopping? N  Preparing Food and eating ? N  Using the Toilet? N  In the past six months, have you accidently leaked urine? N  Do you have problems with loss of bowel control? N  Managing your Medications? N  Managing your Finances? N  Housekeeping or managing your Housekeeping? N  Some recent data might be hidden    Patient Care Team: Lucretia Kern, DO as PCP - General (Family Medicine) Arta Silence, MD as Consulting Physician (Gastroenterology) Pieter Partridge, DO as Consulting Physician (Neurology) Romine, Lubertha South, MD as Consulting Physician (Obstetrics and Gynecology) Rutherford Guys, MD as Consulting Physician (Ophthalmology) Lynda Rainwater, DDS as Consulting Physician (Dentistry)    Assessment:   This is a routine wellness examination for Dahlila.  Exercise Activities and Dietary recommendations    Goals    . Patient Stated     Will work on sleep and if measures discussed do not work, come back to see Dr Maudie Mercury       Fall Risk Fall Risk  05/24/2018 03/26/2018 10/21/2016 11/23/2015 03/26/2015  Falls in the past year? No No No No No  Comment - [place] Telephone Survey: data to providers prior to load - - -     Depression Screen PHQ 2/9 Scores 05/24/2018 10/21/2016 03/26/2015 12/08/2014  PHQ - 2 Score 0 0 0 0     Cognitive Function     Ad8 score reviewed for issues:  Issues making decisions:  Less interest in hobbies / activities:  Repeats questions, stories (family complaining):  Trouble using ordinary gadgets (microwave, computer, phone):  Forgets the month or year:   Mismanaging finances:   Remembering appts:  Daily problems with  thinking and/or memory: Ad8 score is=0 Mother had memory loss         Immunization History  Administered Date(s) Administered  . Influenza Split 06/26/2011, 05/18/2012, 04/25/2018  . Influenza Whole 05/06/2009  . Influenza, High Dose Seasonal PF 05/17/2015, 05/19/2017  . Influenza-Unspecified 05/25/2016  . Pneumococcal Conjugate-13 08/26/2007  . Pneumococcal Polysaccharide-23 08/20/2011  . Tdap 08/21/2011  . Zoster 06/26/2011     Screening Tests Health Maintenance  Topic Date Due  . MAMMOGRAM  06/17/2018  . COLONOSCOPY  05/24/2019  . TETANUS/TDAP  08/20/2021  . INFLUENZA VACCINE  Completed  . DEXA SCAN  Completed  . Hepatitis C Screening  Completed  . PNA vac Low Risk Adult  Completed          Plan:      PCP Notes   Health Maintenance Colonoscopy 04/2016 - q 3 years due again in 04/2019 [name]  Mammogram 05/2017 annually; scheduled for next month  Dexa 10/2009 - normal  Had one shingrix but could not find any pharmacy who could give her the second. Had the 1st over a year ago. Educated outcomes based on 2 vaccines within 6 months; she can repeat the series but declines for now   Abnormal Screens  none  Referrals  none  Patient concerns; Knee pain from fall still not much improved Does not want to see ortho and no falls since Feb  Sleep needs adjusting  Can't go to bed at 11pm and now going to bed 1 am and sleeping to 10 pm   has been taking melatonin and it does not help Trying to get off the computer at hs  Discussed sleep hygiene Going to bed 30 minutes earlier, staying off the computer, trying brain exercises and DB to relax. Will make an apt to come see Dr. Maudie Mercury if she needs medication.   Taking gel cap every day for bowels- referred to GI for future issues- has been like this all of her life. 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 Medical and social history . Use of alcohol, tobacco or illicit drugs  . Current medications and supplements . Functional ability and status . Nutritional status . Physical activity . Advanced directives . List of other physicians . Hospitalizations, surgeries, and ER visits in previous 12 months . Vitals . Screenings to include cognitive, depression, and falls . Referrals and appointments  In addition, I have reviewed and discussed with patient certain preventive protocols, quality metrics, and best practice recommendations. A written personalized care plan for preventive services as well as general preventive health recommendations were provided to patient.     Wynetta Fines, RN  05/24/2018

## 2018-05-24 ENCOUNTER — Ambulatory Visit (INDEPENDENT_AMBULATORY_CARE_PROVIDER_SITE_OTHER): Payer: Medicare Other

## 2018-05-24 ENCOUNTER — Ambulatory Visit (INDEPENDENT_AMBULATORY_CARE_PROVIDER_SITE_OTHER): Payer: Medicare Other | Admitting: Family Medicine

## 2018-05-24 ENCOUNTER — Encounter: Payer: Self-pay | Admitting: Family Medicine

## 2018-05-24 VITALS — BP 140/70 | HR 79 | Ht 63.0 in | Wt 155.0 lb

## 2018-05-24 VITALS — BP 140/70 | HR 79 | Temp 97.6°F | Ht 63.25 in | Wt 155.3 lb

## 2018-05-24 DIAGNOSIS — Z0000 Encounter for general adult medical examination without abnormal findings: Secondary | ICD-10-CM | POA: Diagnosis not present

## 2018-05-24 DIAGNOSIS — E785 Hyperlipidemia, unspecified: Secondary | ICD-10-CM

## 2018-05-24 DIAGNOSIS — K759 Inflammatory liver disease, unspecified: Secondary | ICD-10-CM | POA: Diagnosis not present

## 2018-05-24 DIAGNOSIS — R42 Dizziness and giddiness: Secondary | ICD-10-CM | POA: Diagnosis not present

## 2018-05-24 NOTE — Patient Instructions (Addendum)
Ms. Brenda [name] , Thank you for taking time to come for your Medicare Wellness Visit. I appreciate your ongoing commitment to your health goals. Please review the following plan we discussed and let me know if I can assist you in the future.    Calcium 1259m with Vit D 800u per day; more as directed by physician Strength building exercises discussed; can include walking; housework; small weights or stretch bands; silver sneakers if [facility] to the [facility]  Please visit the osteoporosis [email or web address] for up to date recommendations  Shingrix is a vaccine for the prevention of Shingles in Adults 50 and older.  If you are on Medicare, the shingrix is covered under your Part D plan, so you will take both of the vaccines in the series at your pharmacy. Please check with your benefits regarding applicable copays or out of pocket expenses.  The Shingrix is given in 2 vaccines approx 8 weeks apart. You must receive the 2nd dose prior to 6 months from receipt of the first. Please have the pharmacist print out you Immunization  dates for our office records  Could not get the 2nd  1st one was one year ago   You can discuss repeat of shingrix with Dr. KMaudie Mercury   These are the goals we discussed: Goals    . Patient Stated     Will work on sleep and if measures discussed do not work, come back to see Dr KMaudie Mercury      This is a list of the screening recommended for you and due dates:  Health Maintenance  Topic Date Due  . Mammogram  06/17/2018  . Colon Cancer Screening  05/24/2019  . Tetanus Vaccine  08/20/2021  . Flu Shot  Completed  . DEXA scan (bone density measurement)  Completed  .  Hepatitis C: One time screening is recommended by Center for Disease Control  (CDC) for  adults born from [birth year]through 1965.   Completed  . Pneumonia vaccines  Completed   Insomnia Insomnia is a sleep disorder that makes it difficult to fall asleep or to stay asleep. Insomnia can cause tiredness (fatigue), low energy,  difficulty concentrating, mood swings, and poor performance at work or school. There are three different ways to classify insomnia:  Difficulty falling asleep.  Difficulty staying asleep.  Waking up too early in the morning.  Any type of insomnia can be long-term (chronic) or short-term (acute). Both are common. Short-term insomnia usually lasts for three months or less. Chronic insomnia occurs at least three times a week for longer than three months. What are the causes? Insomnia may be caused by another condition, situation, or substance, such as:  Anxiety.  Certain medicines.  Gastroesophageal reflux disease (GERD) or other gastrointestinal conditions.  Asthma or other breathing conditions.  Restless legs syndrome, sleep apnea, or other sleep disorders.  Chronic pain.  Menopause. This may include hot flashes.  Stroke.  Abuse of alcohol, tobacco, or illegal drugs.  Depression.  Caffeine.  Neurological disorders, such as Alzheimer disease.  An overactive thyroid (hyperthyroidism).  The cause of insomnia may not be known. What increases the risk? Risk factors for insomnia include:  Gender. Women are more commonly affected than men.  Age. Insomnia is more common as you get older.  Stress. This may involve your professional or personal life.  Income. Insomnia is more common in people with lower income.  Lack of exercise.  Irregular work schedule or night shifts.  Traveling between different time zones.  What are the signs or symptoms? If you have insomnia, trouble falling asleep or trouble staying asleep is the main symptom. This may lead to other symptoms, such as:  Feeling fatigued.  Feeling nervous about going to sleep.  Not feeling rested in the morning.  Having trouble concentrating.  Feeling irritable, anxious, or depressed.  How is this treated? Treatment for insomnia depends on the cause. If your insomnia is caused by an underlying  condition, treatment will focus on addressing the condition. Treatment may also include:  Medicines to help you sleep.  Counseling or therapy.  Lifestyle adjustments.  Follow these instructions at home:  Take medicines only as directed by your health care provider.  Keep regular sleeping and waking hours. Avoid naps.  Keep a sleep diary to help you and your health care provider figure out what could be causing your insomnia. Include: ? When you sleep. ? When you wake up during the night. ? How well you sleep. ? How rested you feel the next day. ? Any side effects of medicines you are taking. ? What you eat and drink.  Make your bedroom a comfortable place where it is easy to fall asleep: ? Put up shades or special blackout curtains to block light from outside. ? Use a white noise machine to block noise. ? Keep the temperature cool.  Exercise regularly as directed by your health care provider. Avoid exercising right before bedtime.  Use relaxation techniques to manage stress. Ask your health care provider to suggest some techniques that may work well for you. These may include: ? Breathing exercises. ? Routines to release muscle tension. ? Visualizing peaceful scenes.  Cut back on alcohol, caffeinated beverages, and cigarettes, especially close to bedtime. These can disrupt your sleep.  Do not overeat or eat spicy foods right before bedtime. This can lead to digestive discomfort that can make it hard for you to sleep.  Limit screen use before bedtime. This includes: ? Watching TV. ? Using your smartphone, tablet, and computer.  Stick to a routine. This can help you fall asleep faster. Try to do a quiet activity, brush your teeth, and go to bed at the same time each night.  Get out of bed if you are still awake after 15 minutes of trying to sleep. Keep the lights down, but try reading or doing a quiet activity. When you feel sleepy, go back to bed.  Make sure that you  drive carefully. Avoid driving if you feel very sleepy.  Keep all follow-up appointments as directed by your health care provider. This is important. Contact a health care provider if:  You are tired throughout the day or have trouble in your daily routine due to sleepiness.  You continue to have sleep problems or your sleep problems get worse. Get help right away if:  You have serious thoughts about hurting yourself or someone else. This information is not intended to replace advice given to you by your health care provider. Make sure you discuss any questions you have with your health care provider. Document Released: 08/08/2000 Document Revised: 01/11/2016 Document Reviewed: 05/12/2014 Elsevier Interactive Patient Education  2018 [employer].    Constipation, Adult Constipation is when a person:  Poops (has a bowel movement) fewer times in a week than normal.  Has a hard time pooping.  Has poop that is dry, hard, or bigger than normal.  Follow these instructions at home: Eating and drinking   Eat foods that have a lot of  fiber, such as: ? Fresh fruits and vegetables. ? Whole grains. ? Beans.  Eat less of foods that are high in fat, low in fiber, or overly processed, such as: ? [place] fries. ? Hamburgers. ? Cookies. ? Candy. ? Soda.  Drink enough fluid to keep your pee (urine) clear or pale yellow. General instructions  Exercise regularly or as told by your doctor.  Go to the restroom when you feel like you need to poop. Do not hold it in.  Take over-the-counter and prescription medicines only as told by your doctor. These include any fiber supplements.  Do pelvic floor retraining exercises, such as: ? Doing deep breathing while relaxing your lower belly (abdomen). ? Relaxing your pelvic floor while pooping.  Watch your condition for any changes.  Keep all follow-up visits as told by your doctor. This is important. Contact a doctor if:  You have pain  that gets worse.  You have a fever.  You have not pooped for 4 days.  You throw up (vomit).  You are not hungry.  You lose weight.  You are bleeding from the anus.  You have thin, pencil-like poop (stool). Get help right away if:  You have a fever, and your symptoms suddenly get worse.  You leak poop or have blood in your poop.  Your belly feels hard or bigger than normal (is bloated).  You have very bad belly pain.  You feel dizzy or you faint. This information is not intended to replace advice given to you by your health care provider. Make sure you discuss any questions you have with your health care provider. Document Released: 01/28/2008 Document Revised: 02/29/2016 Document Reviewed: 01/30/2016 Elsevier Interactive Patient Education  2018 [place] in the Home Falls can cause injuries. They can happen to people of all ages. There are many things you can do to make your home safe and to help prevent falls. What can I do on the outside of my home?  Regularly fix the edges of walkways and driveways and fix any cracks.  Remove anything that might make you trip as you walk through a door, such as a raised step or threshold.  Trim any bushes or trees on the path to your home.  Use bright outdoor lighting.  Clear any walking paths of anything that might make someone trip, such as rocks or tools.  Regularly check to see if handrails are loose or broken. Make sure that both sides of any steps have handrails.  Any raised decks and porches should have guardrails on the edges.  Have any leaves, snow, or ice cleared regularly.  Use sand or salt on walking paths during winter.  Clean up any spills in your garage right away. This includes oil or grease spills. What can I do in the bathroom?  Use night lights.  Install grab bars by the toilet and in the tub and shower. Do not use towel bars as grab bars.  Use non-skid mats or decals in the tub  or shower.  If you need to sit down in the shower, use a plastic, non-slip stool.  Keep the floor dry. Clean up any water that spills on the floor as soon as it happens.  Remove soap buildup in the tub or shower regularly.  Attach bath mats securely with double-sided non-slip rug tape.  Do not have throw rugs and other things on the floor that can make you trip. What can I do in the bedroom?  Use night lights.  Make sure that you have a light by your bed that is easy to reach.  Do not use any sheets or blankets that are too big for your bed. They should not hang down onto the floor.  Have a firm chair that has side arms. You can use this for support while you get dressed.  Do not have throw rugs and other things on the floor that can make you trip. What can I do in the kitchen?  Clean up any spills right away.  Avoid walking on wet floors.  Keep items that you use a lot in easy-to-reach places.  If you need to reach something above you, use a strong step stool that has a grab bar.  Keep electrical cords out of the way.  Do not use floor polish or wax that makes floors slippery. If you must use wax, use non-skid floor wax.  Do not have throw rugs and other things on the floor that can make you trip. What can I do with my stairs?  Do not leave any items on the stairs.  Make sure that there are handrails on both sides of the stairs and use them. Fix handrails that are broken or loose. Make sure that handrails are as long as the stairways.  Check any carpeting to make sure that it is firmly attached to the stairs. Fix any carpet that is loose or worn.  Avoid having throw rugs at the top or bottom of the stairs. If you do have throw rugs, attach them to the floor with carpet tape.  Make sure that you have a light switch at the top of the stairs and the bottom of the stairs. If you do not have them, ask someone to add them for you. What else can I do to help prevent  falls?  Wear shoes that: ? Do not have high heels. ? Have rubber bottoms. ? Are comfortable and fit you well. ? Are closed at the toe. Do not wear sandals.  If you use a stepladder: ? Make sure that it is fully opened. Do not climb a closed stepladder. ? Make sure that both sides of the stepladder are locked into place. ? Ask someone to hold it for you, if possible.  Clearly mark and make sure that you can see: ? Any grab bars or handrails. ? First and last steps. ? Where the edge of each step is.  Use tools that help you move around (mobility aids) if they are needed. These include: ? Canes. ? Walkers. ? Scooters. ? Crutches.  Turn on the lights when you go into a dark area. Replace any light bulbs as soon as they burn out.  Set up your furniture so you have a clear path. Avoid moving your furniture around.  If any of your floors are uneven, fix them.  If there are any pets around you, be aware of where they are.  Review your medicines with your doctor. Some medicines can make you feel dizzy. This can increase your chance of falling. Ask your doctor what other things that you can do to help prevent falls. This information is not intended to replace advice given to you by your health care provider. Make sure you discuss any questions you have with your health care provider. Document Released: 06/07/2009 Document Revised: 01/17/2016 Document Reviewed: 09/15/2014 Elsevier Interactive Patient Education  2018 [place] Maintenance, Female Adopting a healthy lifestyle and getting preventive care can  go a long way to promote health and wellness. Talk with your health care provider about what schedule of regular examinations is right for you. This is a good chance for you to check in with your provider about disease prevention and staying healthy. In between checkups, there are plenty of things you can do on your own. Experts have done a lot of research about which  lifestyle changes and preventive measures are most likely to keep you healthy. Ask your health care provider for more information. Weight and diet Eat a healthy diet  Be sure to include plenty of vegetables, fruits, low-fat dairy products, and lean protein.  Do not eat a lot of foods high in solid fats, added sugars, or salt.  Get regular exercise. This is one of the most important things you can do for your health. ? Most adults should exercise for at least 150 minutes each week. The exercise should increase your heart rate and make you sweat (moderate-intensity exercise). ? Most adults should also do strengthening exercises at least twice a week. This is in addition to the moderate-intensity exercise.  Maintain a healthy weight  Body mass index (BMI) is a measurement that can be used to identify possible weight problems. It estimates body fat based on height and weight. Your health care provider can help determine your BMI and help you achieve or maintain a healthy weight.  For females 46 years of age and older: ? A BMI below 18.5 is considered underweight. ? A BMI of 18.5 to 24.9 is normal. ? A BMI of 25 to 29.9 is considered overweight. ? A BMI of 30 and above is considered obese.  Watch levels of cholesterol and blood lipids  You should start having your blood tested for lipids and cholesterol at 72 years of age, then have this test every 5 years.  You may need to have your cholesterol levels checked more often if: ? Your lipid or cholesterol levels are high. ? You are older than 72 years of age. ? You are at high risk for heart disease.  Cancer screening Lung Cancer  Lung cancer screening is recommended for adults 63-59 years old who are at high risk for lung cancer because of a history of smoking.  A yearly low-dose CT scan of the lungs is recommended for people who: ? Currently smoke. ? Have quit within the past 15 years. ? Have at least a 30-pack-year history of  smoking. A pack year is smoking an average of one pack of cigarettes a day for 1 year.  Yearly screening should continue until it has been 15 years since you quit.  Yearly screening should stop if you develop a health problem that would prevent you from having lung cancer treatment.  Breast Cancer  Practice breast self-awareness. This means understanding how your breasts normally appear and feel.  It also means doing regular breast self-exams. Let your health care provider know about any changes, no matter how small.  If you are in your 20s or 30s, you should have a clinical breast exam (CBE) by a health care provider every 1-3 years as part of a regular health exam.  If you are 29 or older, have a CBE every year. Also consider having a breast X-ray (mammogram) every year.  If you have a family history of breast cancer, talk to your health care provider about genetic screening.  If you are at high risk for breast cancer, talk to your health care provider about having  an MRI and a mammogram every year.  Breast cancer gene (BRCA) assessment is recommended for women who have family members with BRCA-related cancers. BRCA-related cancers include: ? Breast. ? Ovarian. ? Tubal. ? Peritoneal cancers.  Results of the assessment will determine the need for genetic counseling and BRCA1 and BRCA2 testing.  Cervical Cancer Your health care provider may recommend that you be screened regularly for cancer of the pelvic organs (ovaries, uterus, and vagina). This screening involves a pelvic examination, including checking for microscopic changes to the surface of your cervix (Pap test). You may be encouraged to have this screening done every 3 years, beginning at age 62.  For women ages 24-65, health care providers may recommend pelvic exams and Pap testing every 3 years, or they may recommend the Pap and pelvic exam, combined with testing for human papilloma virus (HPV), every 5 years. Some types of  HPV increase your risk of cervical cancer. Testing for HPV may also be done on women of any age with unclear Pap test results.  Other health care providers may not recommend any screening for nonpregnant women who are considered low risk for pelvic cancer and who do not have symptoms. Ask your health care provider if a screening pelvic exam is right for you.  If you have had past treatment for cervical cancer or a condition that could lead to cancer, you need Pap tests and screening for cancer for at least 20 years after your treatment. If Pap tests have been discontinued, your risk factors (such as having a new sexual partner) need to be reassessed to determine if screening should resume. Some women have medical problems that increase the chance of getting cervical cancer. In these cases, your health care provider may recommend more frequent screening and Pap tests.  Colorectal Cancer  This type of cancer can be detected and often prevented.  Routine colorectal cancer screening usually begins at 72 years of age and continues through 71 years of age.  Your health care provider may recommend screening at an earlier age if you have risk factors for colon cancer.  Your health care provider may also recommend using home test kits to check for hidden blood in the stool.  A small camera at the end of a tube can be used to examine your colon directly (sigmoidoscopy or colonoscopy). This is done to check for the earliest forms of colorectal cancer.  Routine screening usually begins at age 25.  Direct examination of the colon should be repeated every 5-10 years through 72 years of age. However, you may need to be screened more often if early forms of precancerous polyps or small growths are found.  Skin Cancer  Check your skin from head to toe regularly.  Tell your health care provider about any new moles or changes in moles, especially if there is a change in a mole's shape or color.  Also tell  your health care provider if you have a mole that is larger than the size of a pencil eraser.  Always use sunscreen. Apply sunscreen liberally and repeatedly throughout the day.  Protect yourself by wearing long sleeves, pants, a wide-brimmed hat, and sunglasses whenever you are outside.  Heart disease, diabetes, and high blood pressure  High blood pressure causes heart disease and increases the risk of stroke. High blood pressure is more likely to develop in: ? People who have blood pressure in the high end of the normal range (130-139/85-89 mm Hg). ? People who are overweight  or obese. ? People who are African American.  If you are 66-92 years of age, have your blood pressure checked every 3-5 years. If you are 88 years of age or older, have your blood pressure checked every year. You should have your blood pressure measured twice-once when you are at a hospital or clinic, and once when you are not at a hospital or clinic. Record the average of the two measurements. To check your blood pressure when you are not at a hospital or clinic, you can use: ? An automated blood pressure machine at a pharmacy. ? A home blood pressure monitor.  If you are between 80 years and 22 years old, ask your health care provider if you should take aspirin to prevent strokes.  Have regular diabetes screenings. This involves taking a blood sample to check your fasting blood sugar level. ? If you are at a normal weight and have a low risk for diabetes, have this test once every three years after 72 years of age. ? If you are overweight and have a high risk for diabetes, consider being tested at a younger age or more often. Preventing infection Hepatitis B  If you have a higher risk for hepatitis B, you should be screened for this virus. You are considered at high risk for hepatitis B if: ? You were born in a country where hepatitis B is common. Ask your health care provider which countries are considered high  risk. ? Your parents were born in a high-risk country, and you have not been immunized against hepatitis B (hepatitis B vaccine). ? You have HIV or AIDS. ? You use needles to inject street drugs. ? You live with someone who has hepatitis B. ? You have had sex with someone who has hepatitis B. ? You get hemodialysis treatment. ? You take certain medicines for conditions, including cancer, organ transplantation, and autoimmune conditions.  Hepatitis C  Blood testing is recommended for: ? Everyone born from [birth year] through 1965. ? Anyone with known risk factors for hepatitis C.  Sexually transmitted infections (STIs)  You should be screened for sexually transmitted infections (STIs) including gonorrhea and chlamydia if: ? You are sexually active and are younger than 72 years of age. ? You are older than 72 years of age and your health care provider tells you that you are at risk for this type of infection. ? Your sexual activity has changed since you were last screened and you are at an increased risk for chlamydia or gonorrhea. Ask your health care provider if you are at risk.  If you do not have HIV, but are at risk, it may be recommended that you take a prescription medicine daily to prevent HIV infection. This is called pre-exposure prophylaxis (PrEP). You are considered at risk if: ? You are sexually active and do not regularly use condoms or know the HIV status of your partner(s). ? You take drugs by injection. ? You are sexually active with a partner who has HIV.  Talk with your health care provider about whether you are at high risk of being infected with HIV. If you choose to begin PrEP, you should first be tested for HIV. You should then be tested every 3 months for as long as you are taking PrEP. Pregnancy  If you are premenopausal and you may become pregnant, ask your health care provider about preconception counseling.  If you may become pregnant, take 400 to 800 micrograms  (mcg) of folic acid every  day.  If you want to prevent pregnancy, talk to your health care provider about birth control (contraception). Osteoporosis and menopause  Osteoporosis is a disease in which the bones lose minerals and strength with aging. This can result in serious bone fractures. Your risk for osteoporosis can be identified using a bone density scan.  If you are 98 years of age or older, or if you are at risk for osteoporosis and fractures, ask your health care provider if you should be screened.  Ask your health care provider whether you should take a calcium or vitamin D supplement to lower your risk for osteoporosis.  Menopause may have certain physical symptoms and risks.  Hormone replacement therapy may reduce some of these symptoms and risks. Talk to your health care provider about whether hormone replacement therapy is right for you. Follow these instructions at home:  Schedule regular health, dental, and eye exams.  Stay current with your immunizations.  Do not use any tobacco products including cigarettes, chewing tobacco, or electronic cigarettes.  If you are pregnant, do not drink alcohol.  If you are breastfeeding, limit how much and how often you drink alcohol.  Limit alcohol intake to no more than 1 drink per day for nonpregnant women. One drink equals 12 ounces of beer, 5 ounces of wine, or 1 ounces of hard liquor.  Do not use street drugs.  Do not share needles.  Ask your health care provider for help if you need support or information about quitting drugs.  Tell your health care provider if you often feel depressed.  Tell your health care provider if you have ever been abused or do not feel safe at home. This information is not intended to replace advice given to you by your health care provider. Make sure you discuss any questions you have with your health care provider. Document Released: 02/24/2011 Document Revised: 01/17/2016 Document Reviewed:  05/15/2015 Elsevier Interactive Patient Education  [employer].

## 2018-05-24 NOTE — Progress Notes (Signed)
Brenda R Kim, DO  

## 2018-05-24 NOTE — Patient Instructions (Addendum)
BEFORE YOU LEAVE: -AWV with [name] -depression screening in epic -follow up: 6 months   We recommend the following healthy lifestyle for LIFE: 1) Small portions. But, make sure to get regular (at least 3 per day), healthy meals and small healthy snacks if needed.  2) Eat a healthy clean diet.   TRY TO EAT: -at least 5-7 servings of low sugar, colorful, and nutrient rich vegetables per day (not corn, potatoes or bananas.) -berries are the best choice if you wish to eat fruit (only eat small amounts if trying to reduce weight)  -lean meets (fish, white meat of chicken or [place]) -vegan proteins for some meals - beans or tofu, whole grains, nuts and seeds -Replace bad fats with good fats - good fats include: fish, nuts and seeds, canola oil, olive oil -small amounts of low fat or non fat dairy -small amounts of100 % whole grains - check the lables -drink plenty of water  AVOID: -SUGAR, sweets, anything with added sugar, corn syrup or sweeteners - must read labels as even foods advertised as "healthy" often are loaded with sugar -if you must have a sweetener, small amounts of stevia may be best -sweetened beverages and artificially sweetened beverages -simple starches (rice, bread, potatoes, pasta, chips, etc - small amounts of 100% whole grains are ok) -red meat, pork, butter -fried foods, fast food, processed food, excessive dairy, eggs and coconut.  3)Get at least 150 minutes of sweaty aerobic exercise per week.  4)Reduce stress - consider counseling, meditation and relaxation to balance other aspects of your life.

## 2018-06-17 DIAGNOSIS — Z961 Presence of intraocular lens: Secondary | ICD-10-CM | POA: Diagnosis not present

## 2018-06-17 DIAGNOSIS — H02834 Dermatochalasis of left upper eyelid: Secondary | ICD-10-CM | POA: Diagnosis not present

## 2018-06-17 DIAGNOSIS — H02831 Dermatochalasis of right upper eyelid: Secondary | ICD-10-CM | POA: Diagnosis not present

## 2018-06-18 ENCOUNTER — Ambulatory Visit: Payer: Medicare Other

## 2018-07-08 ENCOUNTER — Other Ambulatory Visit: Payer: Self-pay | Admitting: Family Medicine

## 2018-07-12 NOTE — Progress Notes (Signed)
72 y.o. G22P0000 Married Caucasian female here for annual exam.    Vertigo and balance improved.   Patient also complains of decrease libido. OK with this.   Spontaneous urinary incontinence and leak with cough and sneeze.  Feels her incontinence worsens when she is almost due for her Vagifem.  Notes her absorbant pad is full.   Patient is concerned about thyroid cancer . Concerned her right side of her neck is enlarged. No problems swallowing.   Mother decreased in April 2019.   Labs with PCP.  PCP:   Colin Benton, DO  Patient's last menstrual period was 08/25/1992.           Sexually active: No.  The current method of family planning is post menopausal status/Hysterectomy.    Exercising: No.   Smoker:  no  Health Maintenance: Pap: 04/2008 Neg History of abnormal Pap:  no MMG: 06-17-17 Neg/density B/BiRads1--APPT. 07-28-18 Colonoscopy: 2018 or 08/2017. [facility]--repeat 3years  BMD:11-22-09 Result :T Score, 0.6 Spine / 0.5 Left Femur Neck TDaP:  08-21-11 Gardasil:   no HIV: never Hep C: patient states she has had hepatitis in the past Screening Labs:  Hb today: PCP  Flu vaccine:  Done.    reports that she quit smoking about 27 years ago. Her smoking use included cigarettes. She has a 30.00 pack-year smoking history. She has never used smokeless tobacco. She reports that she does not drink alcohol or use drugs.  Past Medical History:  Diagnosis Date  . Arthritis   . Bronchitis    hx of when smoked   . Cancer ([place]) 04/16/09   kidney cancer - tx by [facility] per her report released from f/u  . Cataracts, bilateral   . Diverticulitis 11/2004  . Endometriosis 1994  . GERD (gastroesophageal reflux disease)    hx hiatal hernia, hx esophageal stricture s/p dilation  . Headache   . Hepatitis   . History of chicken pox   . History of hiatal hernia   . History of measles   . History of mumps as a child   . Hx of hepatitis    with cirrhosis - per her report from  [place] and eval in 2013 at [place]  . Hyperlipidemia   . Left ear pain   . Migraines    pt states vesticular migraines has dizziness in relation   . Numbness and tingling    feet bilat has had for 40 years  . Right knee pain   . Scarlet fever   . Shingles   . Urinary tract bacterial infections    hx of  . Vertigo    associated with headache, intermittent, chronic    Past Surgical History:  Procedure Laterality Date  . ABDOMINAL HYSTERECTOMY  1994   TAH/BSO due to endometriosis  . APPENDECTOMY    . CATARACT EXTRACTION W/ INTRAOCULAR LENS IMPLANT Bilateral 05/2015, 04/2015  . DIAGNOSTIC LAPAROSCOPY    . LAPAROSCOPIC PARTIAL COLECTOMY N/A 04/10/2015   Procedure: LAPAROSCOPIC PARTIAL CECTOMY;  Surgeon: Ralene Ok, MD;  Location: [facility];  Service: General;  Laterality: N/A;  . LUNG BIOPSY  1996   Negative  . NEPHRECTOMY  04/16/09   Partial removal due to cancer  . OOPHORECTOMY    . TONSILLECTOMY    . TONSILLECTOMY      Current Outpatient Medications  Medication Sig Dispense Refill  . cycloSPORINE (RESTASIS) 0.05 % ophthalmic emulsion Apply to eye 2 (two) times daily.    . Estradiol (YUVAFEM) 10 MCG TABS  vaginal tablet USE TWICE WEEKLY AS DIRECTED 24 tablet 3  . Multiple Vitamin (MULTIVITAMIN) tablet Take 1 tablet by mouth daily.      Marland Kitchen omeprazole (PRILOSEC) 20 MG capsule Take 1 capsule by mouth daily.    . pravastatin (PRAVACHOL) 20 MG tablet TAKE 1 TABLET BY MOUTH EVERY DAY 90 tablet 1  . venlafaxine XR (EFFEXOR-XR) 75 MG 24 hr capsule Take 75 mg by mouth daily.  3   No current facility-administered medications for this visit.     Family History  Problem Relation Age of Onset  . Cancer Father   . Thyroid cancer Mother   . Cancer Mother        thyroid/ renal cell ca   . Colon cancer Neg Hx     Review of Systems  Genitourinary: Positive for frequency.       Incontinence  Musculoskeletal:       Muscle and joint aches  Neurological:       Memory issues  All  other systems reviewed and are negative.   Exam:   BP 138/66 (BP Location: Right Arm, Patient Position: Sitting, Cuff Size: Normal)   Pulse 80   Ht 5\' 3"  (1.6 m)   Wt 156 lb 6.4 oz (70.9 kg)   LMP 08/25/1992   BMI 27.71 kg/m     General appearance: alert, cooperative and appears stated age Head: Normocephalic, without obvious abnormality, atraumatic Neck: no adenopathy, supple, symmetrical, trachea midline and thyroid normal to inspection and palpation Lungs: clear to auscultation bilaterally Breasts: normal appearance, no masses or tenderness, No nipple retraction or dimpling, No nipple discharge or bleeding, No axillary or supraclavicular adenopathy Heart: regular rate and rhythm Abdomen: soft, non-tender; no masses, no organomegaly Extremities: extremities normal, atraumatic, no cyanosis or edema Skin: Skin color, texture, turgor normal. No rashes or lesions Lymph nodes: Cervical, supraclavicular, and axillary nodes normal. No abnormal inguinal nodes palpated Neurologic: Grossly normal  Pelvic: External genitalia:  no lesions              Urethra:  normal appearing urethra with no masses, tenderness or lesions              Bartholins and Skenes: normal                 Vagina: normal appearing vagina with normal color and discharge, no lesions              Cervix:  absent              Pap taken: No. Bimanual Exam:  Uterus:   absent              Adnexa: no mass, fullness, tenderness              Rectal exam: Yes.  .  Confirms.              Anus:  normal sphincter tone, no lesions  Chaperone was present for exam.  Assessment:   Well woman visit with normal exam. Status post TAH/BSO for endometriosis. Hot flashes at night when Effexor is due.  Decreased libido.  On Vagifem.  Mixed incontinence.  Status post partial colectomy/appendectomy due to neoplastic polyp found on colonoscopy. Status post partial nephrectomy for cancer.   Plan: Mammogram screening. Recommended  self breast awareness. Pap and HR HPV as above. Guidelines for Calcium, Vitamin D, regular exercise program including cardiovascular and weight bearing exercise. Switch to Estring.  Discussed potential effect on breast cancer.  Follow up annually and prn.   After visit summary provided.

## 2018-07-13 DIAGNOSIS — H02834 Dermatochalasis of left upper eyelid: Secondary | ICD-10-CM | POA: Diagnosis not present

## 2018-07-13 DIAGNOSIS — H02413 Mechanical ptosis of bilateral eyelids: Secondary | ICD-10-CM | POA: Diagnosis not present

## 2018-07-13 DIAGNOSIS — H53483 Generalized contraction of visual field, bilateral: Secondary | ICD-10-CM | POA: Diagnosis not present

## 2018-07-13 DIAGNOSIS — H02831 Dermatochalasis of right upper eyelid: Secondary | ICD-10-CM | POA: Diagnosis not present

## 2018-07-13 DIAGNOSIS — H04123 Dry eye syndrome of bilateral lacrimal glands: Secondary | ICD-10-CM | POA: Diagnosis not present

## 2018-07-13 DIAGNOSIS — H57813 Brow ptosis, bilateral: Secondary | ICD-10-CM | POA: Diagnosis not present

## 2018-07-13 DIAGNOSIS — H0279 Other degenerative disorders of eyelid and periocular area: Secondary | ICD-10-CM | POA: Diagnosis not present

## 2018-07-14 ENCOUNTER — Ambulatory Visit (INDEPENDENT_AMBULATORY_CARE_PROVIDER_SITE_OTHER): Payer: Medicare Other | Admitting: Obstetrics and Gynecology

## 2018-07-14 ENCOUNTER — Encounter: Payer: Self-pay | Admitting: Obstetrics and Gynecology

## 2018-07-14 ENCOUNTER — Other Ambulatory Visit: Payer: Self-pay

## 2018-07-14 VITALS — BP 138/66 | HR 80 | Ht 63.0 in | Wt 156.4 lb

## 2018-07-14 DIAGNOSIS — Z124 Encounter for screening for malignant neoplasm of cervix: Secondary | ICD-10-CM

## 2018-07-14 DIAGNOSIS — Z01419 Encounter for gynecological examination (general) (routine) without abnormal findings: Secondary | ICD-10-CM | POA: Diagnosis not present

## 2018-07-14 MED ORDER — ESTRADIOL 2 MG VA RING: 2.0000 mg | VAGINAL_RING | VAGINAL | 3 refills | Status: DC

## 2018-07-14 NOTE — Patient Instructions (Signed)

## 2018-07-28 ENCOUNTER — Ambulatory Visit
Admission: RE | Admit: 2018-07-28 | Discharge: 2018-07-28 | Disposition: A | Discharge status: Home / Self Care | Payer: Medicare Other | Source: Ambulatory Visit | Attending: Obstetrics and Gynecology | Admitting: Obstetrics and Gynecology

## 2018-07-28 DIAGNOSIS — Z1231 Encounter for screening mammogram for malignant neoplasm of breast: Secondary | ICD-10-CM | POA: Diagnosis not present

## 2018-09-14 ENCOUNTER — Other Ambulatory Visit: Payer: Self-pay | Admitting: Obstetrics and Gynecology

## 2018-09-14 ENCOUNTER — Telehealth: Payer: Self-pay | Admitting: Obstetrics and Gynecology

## 2018-09-14 NOTE — Telephone Encounter (Signed)
Patient would like to change her prescription back to [facility]. Confirmed pharmacy on file.

## 2018-09-14 NOTE — Telephone Encounter (Signed)
Ok to discontinue Estring and switch back to Vagifem generic twice weekly until annual exam is due.  Her mammogram is up to date.

## 2018-09-14 NOTE — Telephone Encounter (Signed)
Patient states she started Estring vaginal ring 06/2018. Patient is requesting to go back to Yuvafem 10 mcg twice weekly. Patient reports Estring not working well for urinary incontinence and hot flashes. Patient denies any other GYN symptoms. Confirmed pharmacy on file. Advised I will review with [name] and return call with recommendations. Patient agreeable.   [name] -please advise on [facility].

## 2018-09-15 MED ORDER — ESTRADIOL 10 MCG VA TABS: 10.0000 ug | ORAL_TABLET | VAGINAL | 2 refills | Status: DC

## 2018-09-15 NOTE — Telephone Encounter (Signed)
Spoke with patient. RX for Yuvafem 73mcg pv twice weekly #24/2RF to verified pharmacy. Patient verbalizes understanding and is agreeable.   Encounter closed.

## 2018-09-30 ENCOUNTER — Other Ambulatory Visit: Payer: Self-pay | Admitting: Gastroenterology

## 2018-09-30 DIAGNOSIS — K7460 Unspecified cirrhosis of liver: Secondary | ICD-10-CM

## 2018-10-20 ENCOUNTER — Telehealth: Payer: Self-pay | Admitting: *Deleted

## 2018-10-20 NOTE — Telephone Encounter (Signed)
[facility] request for a new Rx for Restasis.  I did not see where this was prescribed by Dr Maudie Mercury.  Is this OK to refill?

## 2018-10-20 NOTE — Telephone Encounter (Signed)
I would think this would come from ophtho? Not sure if Dr. Maudie Mercury would mind writing for it, but perhaps you can check with patient regarding where initial rx came from because I do not see that Dr. Maudie Mercury wrote in past and not sure preference for continuing to write?

## 2018-10-20 NOTE — Telephone Encounter (Signed)
I called the pt and informed her of the message below.  Patient stated she will contact the pharmacy as this request should have been sent to Dr Kellie Moor office.

## 2018-11-01 ENCOUNTER — Ambulatory Visit
Admission: RE | Admit: 2018-11-01 | Discharge: 2018-11-01 | Disposition: A | Discharge status: Home / Self Care | Payer: Medicare Other | Source: Ambulatory Visit | Attending: Gastroenterology | Admitting: Gastroenterology

## 2018-11-01 DIAGNOSIS — K7460 Unspecified cirrhosis of liver: Secondary | ICD-10-CM

## 2018-11-02 DIAGNOSIS — H02413 Mechanical ptosis of bilateral eyelids: Secondary | ICD-10-CM | POA: Diagnosis not present

## 2018-11-02 DIAGNOSIS — H57813 Brow ptosis, bilateral: Secondary | ICD-10-CM | POA: Diagnosis not present

## 2018-11-02 DIAGNOSIS — H02831 Dermatochalasis of right upper eyelid: Secondary | ICD-10-CM | POA: Diagnosis not present

## 2018-11-02 DIAGNOSIS — H0279 Other degenerative disorders of eyelid and periocular area: Secondary | ICD-10-CM | POA: Diagnosis not present

## 2018-11-02 DIAGNOSIS — H02834 Dermatochalasis of left upper eyelid: Secondary | ICD-10-CM | POA: Diagnosis not present

## 2018-11-02 DIAGNOSIS — H53483 Generalized contraction of visual field, bilateral: Secondary | ICD-10-CM | POA: Diagnosis not present

## 2018-11-02 DIAGNOSIS — H04123 Dry eye syndrome of bilateral lacrimal glands: Secondary | ICD-10-CM | POA: Diagnosis not present

## 2018-11-15 ENCOUNTER — Encounter: Payer: Self-pay | Admitting: Physical Therapy

## 2018-11-15 NOTE — Therapy (Signed)
[facility] [address] [place], [place], [address] Phone: [phone]   Fax:  [phone]  Patient Details  Name: Brenda [name] MRN: [id number] Date of Birth: 20-Oct-1945 Referring Provider:  No ref. provider found  Colin Benton, DO               PT DISCHARGE SUMMARY   Pt was seen from 05-05-17 - 12-10-17 in [facility] for vertigo and balance deficits.  Pt received 39 visits. Pt cancelled last PT appt due to failing health of her mother and did not reschedule final appointment.  PT Long Term Goals - 12/01/17 1705      PT LONG TERM GOAL #4   Title  Pt will report at least 25% improvement in vertigo.    Target Date  12/22/17      PT LONG TERM GOAL #6   Title  Pt will report at least 50% improvement in vertigo.    Target Date  12/22/17      PT LONG TERM GOAL #7   Title  Pt will amb. 27' with horizontal head turns without moderate LOB with dizziness rating </= 3/10 intensity.    Target Date  12/22/17      PT LONG TERM GOAL  #9   TITLE  Pt will improve DHI score from 72% to </= 48% to demo improvement in vertigo. REVISED:  Improve DHI score from 84% on 10-01-17 to </ 60%      Baseline  72% 08-13-17;  84% on 10-01-17    Target Date  12/22/17      PT LONG TERM GOAL  #10   TITLE  Improve Lt SLS to >/= 6 secs     Target Date  12/22/17      Pt is discharged due to not returning to PT as of this time.                Encounter Date: 11/15/2018   Alda Lea, PT 11/15/2018, 12:47 PM  [facility] [address] [place], [place], [address] Phone: [phone]   Fax:  [phone]

## 2018-12-21 ENCOUNTER — Ambulatory Visit: Payer: Medicare Other | Admitting: Family Medicine

## 2018-12-21 ENCOUNTER — Other Ambulatory Visit: Payer: Self-pay

## 2018-12-21 NOTE — Progress Notes (Deleted)
Virtual Visit via Video Note  I connected with Brenda [name]  on 12/21/18 at  9:45 AM EDT by a video enabled telemedicine application and verified that I am speaking with the correct person using two identifiers.  Location patient: home Location provider:work or home office Persons participating in the virtual visit: patient, provider  I discussed the limitations of evaluation and management by telemedicine and the availability of in person appointments. The patient expressed understanding and agreed to proceed.   HPI:  Brenda [name] is a pleasant 73 y.o. here for follow up. Chronic medical problems summarized below were reviewed for changes and stability and were updated as needed below. These issues and their treatment remain stable for the most part.  Lipids look great at check last year. ***. Denies CP, SOB, DOE, treatment intolerance or new symptoms.   Has AWV with Brenda [name] 04/2018   Follow up hyperlipidemia: -advised starting statin 04/2017 -she did not tolerate crestor - caused heartburn -tolerating pravastatin fine  Seeing Specialist at [facility] for chronic vertigo/Gait issues: -currently taking effexor for this -did gait/vestibular rehab  -was doing OMT as well -stress with mom in hospice -had extensive eval with [facility] with multiple specialist, they old her no further work up needed, ok to drive  Hx drug induced liver damage: -seeing Dr. Paulita [name] formanagement   ROS: See pertinent positives and negatives per HPI.  Past Medical History:  Diagnosis Date  . Arthritis   . Bronchitis    hx of when smoked   . Cancer ([place]) 04/16/09   kidney cancer - [facility] per her report released from f/u  . Cataracts, bilateral   . Diverticulitis 11/2004  . Endometriosis 1994  . GERD (gastroesophageal reflux disease)    hx hiatal hernia, hx esophageal stricture s/p dilation  . Headache   . Hepatitis   . History of chicken pox   . History of hiatal hernia   . History of measles    . History of mumps as a child   . Hx of hepatitis    with cirrhosis - per her report from [place] and eval in 2013 at [place]  . Hyperlipidemia   . Left ear pain   . Migraines    pt states vesticular migraines has dizziness in relation   . Numbness and tingling    feet bilat has had for 40 years  . Right knee pain   . Scarlet fever   . Shingles   . Urinary tract bacterial infections    hx of  . Vertigo    associated with headache, intermittent, chronic    Past Surgical History:  Procedure Laterality Date  . ABDOMINAL HYSTERECTOMY  1994   TAH/BSO due to endometriosis  . APPENDECTOMY    . CATARACT EXTRACTION W/ INTRAOCULAR LENS IMPLANT Bilateral 05/2015, 04/2015  . DIAGNOSTIC LAPAROSCOPY    . LAPAROSCOPIC PARTIAL COLECTOMY N/A 04/10/2015   Procedure: LAPAROSCOPIC PARTIAL CECTOMY;  Surgeon: Ralene Ok, MD;  Location: [facility];  Service: General;  Laterality: N/A;  . LUNG BIOPSY  1996   Negative  . NEPHRECTOMY  04/16/09   Partial removal due to cancer  . OOPHORECTOMY    . TONSILLECTOMY    . TONSILLECTOMY      Family History  Problem Relation Age of Onset  . Cancer Father   . Thyroid cancer Mother   . Cancer Mother        thyroid/ renal cell ca   . Colon cancer Neg Hx   .  Breast cancer Neg Hx     SOCIAL HX: ***   Current Outpatient Medications:  .  cycloSPORINE (RESTASIS) 0.05 % ophthalmic emulsion, Apply to eye 2 (two) times daily., Disp: , Rfl:  .  Estradiol (YUVAFEM) 10 MCG TABS vaginal tablet, Place 1 tablet (10 mcg total) vaginally 2 (two) times a week., Disp: 24 tablet, Rfl: 2 .  Multiple Vitamin (MULTIVITAMIN) tablet, Take 1 tablet by mouth daily.  , Disp: , Rfl:  .  omeprazole (PRILOSEC) 20 MG capsule, Take 1 capsule by mouth daily., Disp: , Rfl:  .  pravastatin (PRAVACHOL) 20 MG tablet, TAKE 1 TABLET BY MOUTH EVERY DAY, Disp: 90 tablet, Rfl: 1 .  venlafaxine XR (EFFEXOR-XR) 75 MG 24 hr capsule, Take 75 mg by mouth daily., Disp: , Rfl: 3  EXAM:  VITALS  per patient if applicable:  GENERAL: alert, oriented, appears well and in no acute distress  HEENT: atraumatic, conjunttiva clear, no obvious abnormalities on inspection of external nose and ears  NECK: normal movements of the head and neck  LUNGS: on inspection no signs of respiratory distress, breathing rate appears normal, no obvious gross SOB, gasping or wheezing  CV: no obvious cyanosis  MS: moves all visible extremities without noticeable abnormality  PSYCH/NEURO: pleasant and cooperative, no obvious depression or anxiety, speech and thought processing grossly intact  ASSESSMENT AND PLAN:  Discussed the following assessment and plan:  No diagnosis found.     I discussed the assessment and treatment plan with the patient. The patient was provided an opportunity to ask questions and all were answered. The patient agreed with the plan and demonstrated an understanding of the instructions.   The patient was advised to call back or seek an in-person evaluation if the symptoms worsen or if the condition fails to improve as anticipated.   Lucretia Kern, DO

## 2019-03-01 DIAGNOSIS — L820 Inflamed seborrheic keratosis: Secondary | ICD-10-CM | POA: Diagnosis not present

## 2019-04-21 ENCOUNTER — Other Ambulatory Visit: Payer: Self-pay | Admitting: Family Medicine

## 2019-04-27 ENCOUNTER — Other Ambulatory Visit: Payer: Self-pay | Admitting: Family Medicine

## 2019-04-27 MED ORDER — VENLAFAXINE HCL ER 75 MG PO CP24: 75.0000 mg | ORAL_CAPSULE | Freq: Every day | ORAL | 0 refills | Status: DC

## 2019-04-27 NOTE — Telephone Encounter (Signed)
Requested medication (s) are due for refill today: yes  Requested medication (s) are on the active medication list:yes  Last refill:    Future visit scheduled: no  Notes to clinic:  Review for refll  Requested Prescriptions  Pending Prescriptions Disp Refills   venlafaxine XR (EFFEXOR-XR) 75 MG 24 hr capsule  3    Sig: Take 1 capsule (75 mg total) by mouth daily.     Psychiatry: Antidepressants - SNRI - desvenlafaxine & venlafaxine Failed - 04/27/2019  1:38 PM      Failed - LDL in normal range and within 360 days    LDL Cholesterol  Date Value Ref Range Status  12/17/2017 98 0 - 99 mg/dL Final         Failed - Total Cholesterol in normal range and within 360 days    Cholesterol  Date Value Ref Range Status  12/17/2017 168 0 - 200 mg/dL Final    Comment:    ATP III Classification       Desirable:  < 200 mg/dL               Borderline High:  200 - 239 mg/dL          High:  > = 240 mg/dL         Failed - Triglycerides in normal range and within 360 days    Triglycerides  Date Value Ref Range Status  12/17/2017 92.0 0.0 - 149.0 mg/dL Final    Comment:    Normal:  <150 mg/dLBorderline High:  150 - 199 mg/dL         Failed - Valid encounter within last 6 months    Recent Outpatient Visits          11 months ago [facility], [place], DO   1 year ago Hyperlipidemia, unspecified hyperlipidemia type   [facility], [place], DO   1 year ago Cough   [facility], [place], DO   1 year ago [facility], [place], DO   2 years ago [facility] [place], DO             Failed - Completed PHQ-2 or PHQ-9 in the last 360 days.      Passed - Last BP in normal range    BP Readings from Last 1 Encounters:  07/14/18 138/66

## 2019-04-27 NOTE — Telephone Encounter (Signed)
Copied from [place] [id number]. Topic: Quick Communication - Rx Refill/Question >> Apr 27, 2019  1:34 PM Mcneil, Ja-Kwan wrote: Medication: venlafaxine XR (EFFEXOR-XR) 75 MG 24 hr capsule  Has the patient contacted their pharmacy? no  Preferred Pharmacy (with phone number or street name): [id number] [address], [place]. [phone] (Phone)  [phone] (Fax)  Agent: Please be advised that RX refills may take up to 3 business days. We ask that you follow-up with your pharmacy.

## 2019-05-10 ENCOUNTER — Other Ambulatory Visit: Payer: Self-pay | Admitting: Gastroenterology

## 2019-05-10 DIAGNOSIS — K7469 Other cirrhosis of liver: Secondary | ICD-10-CM

## 2019-05-13 ENCOUNTER — Other Ambulatory Visit: Payer: Medicare Other

## 2019-05-21 ENCOUNTER — Other Ambulatory Visit: Payer: Self-pay | Admitting: Family Medicine

## 2019-06-21 ENCOUNTER — Other Ambulatory Visit: Payer: Self-pay | Admitting: Family Medicine

## 2019-07-09 ENCOUNTER — Other Ambulatory Visit: Payer: Self-pay | Admitting: Family Medicine

## 2019-07-25 ENCOUNTER — Ambulatory Visit: Payer: Medicare Other | Admitting: Obstetrics and Gynecology

## 2019-08-15 ENCOUNTER — Telehealth: Payer: Self-pay

## 2019-08-15 NOTE — Telephone Encounter (Signed)
Called pt no answer °

## 2019-08-15 NOTE — Telephone Encounter (Signed)
Copied from [place] [id number]. Topic: General - Other >> Aug 15, 2019  1:03 PM Leward Quan A wrote: Reason for CRM: Patient called to get an appointment for today if possible a virtual visit please She states that she have Bronchitis and feel like something sitting on her chest and need a virtual visit since she need something to help her today. Please call patient at Ph#  [phone]

## 2019-08-16 ENCOUNTER — Other Ambulatory Visit: Payer: Self-pay | Admitting: Family Medicine

## 2019-08-16 ENCOUNTER — Ambulatory Visit
Admission: EM | Admit: 2019-08-16 | Discharge: 2019-08-16 | Disposition: A | Discharge status: Home / Self Care | Payer: Medicare Other | Attending: Physician Assistant | Admitting: Physician Assistant

## 2019-08-16 DIAGNOSIS — Z85528 Personal history of other malignant neoplasm of kidney: Secondary | ICD-10-CM

## 2019-08-16 DIAGNOSIS — R0989 Other specified symptoms and signs involving the circulatory and respiratory systems: Secondary | ICD-10-CM

## 2019-08-16 DIAGNOSIS — Z20828 Contact with and (suspected) exposure to other viral communicable diseases: Secondary | ICD-10-CM | POA: Diagnosis not present

## 2019-08-16 MED ORDER — FLUTICASONE PROPIONATE 50 MCG/ACT NA SUSP: 2.0000 | Freq: Every day | NASAL | 0 refills | Status: DC

## 2019-08-16 MED ORDER — PREDNISONE 50 MG PO TABS: 50.0000 mg | ORAL_TABLET | Freq: Every day | ORAL | 0 refills | Status: DC

## 2019-08-16 NOTE — Telephone Encounter (Signed)
I called the pt back to offer an appt and obtain more information in regards to her symptoms.  Patient stated she has had central chest pain with a sensation of something heavy pushing on her chest for the past 2 days and she feels this is due to bronchitis.  Patient declines a fever, cough, no shortness of breath or nausea.  Stated she does have some lightheadedness and dizziness.  I advised the pt she should go to an urgent care for an in-person evaluation and Roselyn Reef our Publishing copy agreed to this recommendation.  Patient stated this is not related to her heart and I advised the pt this cannot be determined with a virtual visit unfortunately and it is best she be seen in person.  Patient was advised and agreed to go the urgent care on [facility] as this is close to her home.

## 2019-08-16 NOTE — Discharge Instructions (Signed)
COVID PCR testing ordered. I would like you to quarantine until testing results. Prednisone and flonase as directed. If experiencing shortness of breath, trouble breathing, go to the emergency department for further evaluation needed.

## 2019-08-16 NOTE — ED Triage Notes (Signed)
Pt c/o center chest congestion and runny nose x4 days, hx of bronchitis and feels the same

## 2019-08-16 NOTE — ED Provider Notes (Signed)
[facility]    CSN: [id number] Arrival date & time: 08/16/19  1501      History   Chief Complaint Chief Complaint  Patient presents with  . chest congestion    HPIf Brenda [name] is a 73 y.o. female.   73 year old female comes in for 4 day of URI symptoms. Nonproductive cough, rhinorrhea. Center chest pain that is constant, without obvious aggravating or alleviating factor. Denies shortness of breath, N/V, diaphoresis. Denies weakness, dizziness, syncope. Denies fever, chills, body aches. Denies abdominal pain, nausea, vomiting, diarrhea. Denies loss of taste/smell. No known sick contact. Former smoker.   History of kidney cancer s/p resection without chemo or radiation. No active treatment at this time. Denies history of heart disease.      Past Medical History:  Diagnosis Date  . Arthritis   . Bronchitis    hx of when smoked   . Cancer ([place]) 04/16/09   kidney cancer - tx by [facility] per her report released from f/u  . Cataracts, bilateral   . Diverticulitis 11/2004  . Endometriosis 1994  . GERD (gastroesophageal reflux disease)    hx hiatal hernia, hx esophageal stricture s/p dilation  . Headache   . Hepatitis   . History of chicken pox   . History of hiatal hernia   . History of measles   . History of mumps as a child   . Hx of hepatitis    with cirrhosis - per her report from [place] and eval in 2013 at [place]  . Hyperlipidemia   . Left ear pain   . Migraines    pt states vesticular migraines has dizziness in relation   . Numbness and tingling    feet bilat has had for 40 years  . Right knee pain   . Scarlet fever   . Shingles   . Urinary tract bacterial infections    hx of  . Vertigo    associated with headache, intermittent, chronic    Patient Active Problem List   Diagnosis Date Noted  . S/P partial resection of colon 04/10/2015  . Cirrhosis ([place]) 12/08/2014  . Esophageal reflux 12/08/2014  . Esophageal stricture  12/08/2014  . Vertigo 12/08/2014    Past Surgical History:  Procedure Laterality Date  . ABDOMINAL HYSTERECTOMY  1994   TAH/BSO due to endometriosis  . APPENDECTOMY    . CATARACT EXTRACTION W/ INTRAOCULAR LENS IMPLANT Bilateral 05/2015, 04/2015  . DIAGNOSTIC LAPAROSCOPY    . LAPAROSCOPIC PARTIAL COLECTOMY N/A 04/10/2015   Procedure: LAPAROSCOPIC PARTIAL CECTOMY;  Surgeon: Ralene Ok, MD;  Location: [facility];  Service: General;  Laterality: N/A;  . LUNG BIOPSY  1996   Negative  . NEPHRECTOMY  04/16/09   Partial removal due to cancer  . OOPHORECTOMY    . TONSILLECTOMY    . TONSILLECTOMY      OB History    Gravida  0   Para  0   Term  0   Preterm  0   AB  0   Living  0     SAB  0   TAB  0   Ectopic  0   Multiple  0   Live Births  0            Home Medications    Prior to Admission medications   Medication Sig Start Date End Date Taking? Authorizing Provider  cycloSPORINE (RESTASIS) 0.05 % ophthalmic emulsion Apply to eye 2 (two) times daily. 06/02/16  [provider]  Estradiol (YUVAFEM) 10 MCG TABS vaginal tablet Place 1 tablet (10 mcg total) vaginally 2 (two) times a week. 09/16/18   Nunzio Cobbs, MD  fluticasone (FLONASE) 50 MCG/ACT nasal spray Place 2 sprays into both nostrils daily. 08/16/19   Tasia Catchings, Kathryn Linarez V, PA-C  Multiple Vitamin (MULTIVITAMIN) tablet Take 1 tablet by mouth daily.      [provider]  omeprazole (PRILOSEC) 20 MG capsule Take 1 capsule by mouth daily.    [provider]  pravastatin (PRAVACHOL) 20 MG tablet TAKE 1 TABLET BY MOUTH EVERY DAY 04/21/19   Lucretia Kern, DO  predniSONE (DELTASONE) 50 MG tablet Take 1 tablet (50 mg total) by mouth daily with breakfast. 08/16/19   Tasia Catchings, Quintavius Niebuhr V, PA-C  venlafaxine XR (EFFEXOR-XR) 75 MG 24 hr capsule TAKE 1 CAPSULE BY MOUTH EVERY DAY (NEED OFFICE VISIT) 07/11/19   Lucretia Kern, DO    Family History Family History  Problem Relation Age of Onset  . Cancer  Father   . Thyroid cancer Mother   . Cancer Mother        thyroid/ renal cell ca   . Colon cancer Neg Hx   . Breast cancer Neg Hx     Social History Social History   Tobacco Use  . Smoking status: Former Smoker    Packs/day: 1.50    Years: 20.00    Pack years: 30.00    Types: Cigarettes    Quit date: 08/25/1990    Years since quitting: 28.9  . Smokeless tobacco: Never Used  Substance Use Topics  . Alcohol use: No    Comment: Rare  . Drug use: No     Allergies   Codeine, Cottonelle fresh care [attends briefs small], Dexilant [dexlansoprazole], Nitrofurantoin, Other, Ranitidine, Septra [sulfamethoxazole-trimethoprim], and Sulfamethoxazole-trimethoprim   Review of Systems Review of Systems  Reason unable to perform ROS: See HPI as above.     Physical Exam Triage Vital Signs ED Triage Vitals  Enc Vitals Group     BP 08/16/19 1532 139/80     Pulse Rate 08/16/19 1532 89     Resp 08/16/19 1532 20     Temp 08/16/19 1532 (!) 97.4 F (36.3 C)     Temp Source 08/16/19 1532 Oral     SpO2 08/16/19 1532 97 %     Weight --      Height --      Head Circumference --      Peak Flow --      Pain Score 08/16/19 1533 6     Pain Loc --      Pain Edu? --      Excl. in [place]? --    No data found.  Updated Vital Signs BP 139/80 (BP Location: Left Arm)   Pulse 89   Temp (!) 97.4 F (36.3 C) (Oral)   Resp 20   LMP 08/25/1992   SpO2 97%   Physical Exam Constitutional:      General: She is not in acute distress.    Appearance: Normal appearance. She is not ill-appearing, toxic-appearing or diaphoretic.  HENT:     Head: Normocephalic and atraumatic.     Mouth/Throat:     Mouth: Mucous membranes are moist.     Pharynx: Oropharynx is clear. Uvula midline.  Cardiovascular:     Rate and Rhythm: Normal rate and regular rhythm.     Heart sounds: Normal heart sounds. No murmur. No friction rub. No  gallop.   Pulmonary:     Effort: Pulmonary effort is normal. No accessory muscle  usage, prolonged expiration, respiratory distress or retractions.     Comments: Lungs clear to auscultation without adventitious lung sounds. Chest:     Comments: Chest pain reproducible by palpation along the strenum Abdominal:     General: Bowel sounds are normal.     Palpations: Abdomen is soft.     Tenderness: There is no abdominal tenderness. There is no guarding or rebound.  Musculoskeletal:     Cervical back: Normal range of motion and neck supple.  Neurological:     General: No focal deficit present.     Mental Status: She is alert and oriented to person, place, and time.      UC Treatments / Results  Labs (all labs ordered are listed, but only abnormal results are displayed) Labs Reviewed  NOVEL CORONAVIRUS, NAA    EKG   Radiology No results found.  Procedures Procedures (including critical care time)  Medications Ordered in UC Medications - No data to display  Initial Impression / Assessment and Plan / UC Course  I have reviewed the triage vital signs and the nursing notes.  Pertinent labs & imaging results that were available during my care of the patient were reviewed by me and considered in my medical decision making (see chart for details).    COVID PCR test ordered. Patient to quarantine until testing results return. No alarming signs on exam.  Patient speaking in full sentences without respiratory distress.  Symptomatic treatment discussed.  Push fluids.  Return precautions given.  Patient expresses understanding and agrees to plan.  Final Clinical Impressions(s) / UC Diagnoses   Final diagnoses:  Chest congestion   ED Prescriptions    Medication Sig Dispense Auth. Provider   predniSONE (DELTASONE) 50 MG tablet Take 1 tablet (50 mg total) by mouth daily with breakfast. 5 tablet Keilee Denman V, PA-C   fluticasone (FLONASE) 50 MCG/ACT nasal spray Place 2 sprays into both nostrils daily. 1 g Ok Edwards, PA-C     PDMP not reviewed this encounter.   Ok Edwards, PA-C 08/16/19 1559

## 2019-08-16 NOTE — Telephone Encounter (Signed)
Left a detailed message at the pts home number to call for a virtual visit today with Dr Maudie Mercury.

## 2019-08-18 LAB — NOVEL CORONAVIRUS, NAA: SARS-CoV-2, NAA: NOT DETECTED

## 2019-08-24 ENCOUNTER — Other Ambulatory Visit: Payer: Self-pay | Admitting: Family Medicine

## 2019-08-24 NOTE — Telephone Encounter (Signed)
I called the pt and informed her an appt is needed prior to additional refills as her last visit was in September 2019.  Patient stated she is not sure if she wants to continue this and actually was weaning herself off of this.  Patient stated she is not feeling better with a recurrent cough from the urgent care visit last week.  Virtual appt scheduled for tomorrow to discuss both problems and denial sent to the pharmcy.

## 2019-08-25 ENCOUNTER — Encounter: Payer: Self-pay | Admitting: Family Medicine

## 2019-08-25 ENCOUNTER — Other Ambulatory Visit: Payer: Self-pay

## 2019-08-25 ENCOUNTER — Telehealth (INDEPENDENT_AMBULATORY_CARE_PROVIDER_SITE_OTHER): Payer: Medicare Other | Admitting: Family Medicine

## 2019-08-25 DIAGNOSIS — R05 Cough: Secondary | ICD-10-CM

## 2019-08-25 DIAGNOSIS — R059 Cough, unspecified: Secondary | ICD-10-CM

## 2019-08-25 MED ORDER — CEFDINIR 300 MG PO CAPS: 300.0000 mg | ORAL_CAPSULE | Freq: Two times a day (BID) | ORAL | 0 refills | Status: DC

## 2019-08-25 NOTE — Progress Notes (Signed)
Virtual Visit via Video Note  I connected with Brenda [name]  on 08/25/19 at 12:20 PM EST by a video enabled telemedicine application and verified that I am speaking with the correct person using two identifiers. She was not able to get the video to work on her device.  Location patient: home Location provider:work or home office Persons participating in the virtual visit: patient, provider, patient's husband  I discussed the limitations of evaluation and management by telemedicine and the availability of in person appointments. The patient expressed understanding and agreed to proceed.   HPI:  Acute visit for cough: -got sit with resp illness the week of Christmas -was seen in local [facility] 12/22 with neg COVID testing and was treated with prednisone and tessalon -today reports: has had "bronchitis" for 2 weeks now. Reports the cough has improved some but persists still feels tight in the chest. Has coughed up some thick mucus yesterday - beige colored. Was a ? streak of blood in it once last week none since. Has been low energy.  -denies: SOB, fevers, NVD, loss of taste or smell -no known sick contacts  PMH vertigo (managed by neurology), liver disease (managed by GI), Hyperlipidemia. She has not established with a new PCP yet. Due for mamo and colon ca screening.  ROS: See pertinent positives and negatives per HPI.  Past Medical History:  Diagnosis Date  . Arthritis   . Bronchitis    hx of when smoked   . Cancer ([place]) 04/16/09   kidney cancer - tx [facility] per her report released from f/u  . Cataracts, bilateral   . Diverticulitis 11/2004  . Endometriosis 1994  . GERD (gastroesophageal reflux disease)    hx hiatal hernia, hx esophageal stricture s/p dilation  . Headache   . Hepatitis   . History of chicken pox   . History of hiatal hernia   . History of measles   . History of mumps as a child   . Hx of hepatitis    with cirrhosis - per her report from [place] and  eval in 2013 at [place]  . Hyperlipidemia   . Left ear pain   . Migraines    pt states vesticular migraines has dizziness in relation   . Numbness and tingling    feet bilat has had for 40 years  . Right knee pain   . Scarlet fever   . Shingles   . Urinary tract bacterial infections    hx of  . Vertigo    associated with headache, intermittent, chronic    Past Surgical History:  Procedure Laterality Date  . ABDOMINAL HYSTERECTOMY  1994   TAH/BSO due to endometriosis  . APPENDECTOMY    . CATARACT EXTRACTION W/ INTRAOCULAR LENS IMPLANT Bilateral 05/2015, 04/2015  . DIAGNOSTIC LAPAROSCOPY    . LAPAROSCOPIC PARTIAL COLECTOMY N/A 04/10/2015   Procedure: LAPAROSCOPIC PARTIAL CECTOMY;  Surgeon: Ralene Ok, MD;  Location: [facility];  Service: General;  Laterality: N/A;  . LUNG BIOPSY  1996   Negative  . NEPHRECTOMY  04/16/09   Partial removal due to cancer  . OOPHORECTOMY    . TONSILLECTOMY    . TONSILLECTOMY      Family History  Problem Relation Age of Onset  . Cancer Father   . Thyroid cancer Mother   . Cancer Mother        thyroid/ renal cell ca   . Colon cancer Neg Hx   . Breast cancer Neg Hx  SOCIAL HX: see hpi   Current Outpatient Medications:  .  cycloSPORINE (RESTASIS) 0.05 % ophthalmic emulsion, Apply to eye 2 (two) times daily., Disp: , Rfl:  .  Estradiol (YUVAFEM) 10 MCG TABS vaginal tablet, Place 1 tablet (10 mcg total) vaginally 2 (two) times a week., Disp: 24 tablet, Rfl: 2 .  fluticasone (FLONASE) 50 MCG/ACT nasal spray, Place 2 sprays into both nostrils daily., Disp: 1 g, Rfl: 0 .  Multiple Vitamin (MULTIVITAMIN) tablet, Take 1 tablet by mouth daily.  , Disp: , Rfl:  .  pravastatin (PRAVACHOL) 20 MG tablet, TAKE 1 TABLET BY MOUTH EVERY DAY, Disp: 90 tablet, Rfl: 0 .  cefdinir (OMNICEF) 300 MG capsule, Take 1 capsule (300 mg total) by mouth 2 (two) times daily., Disp: 14 capsule, Rfl: 0  EXAM:  VITALS per patient if applicable:  GENERAL: alert,  oriented, appears well and in no acute distress  HEENT: atraumatic, conjunttiva clear, no obvious abnormalities on inspection of external nose and ears  NECK: normal movements of the head and neck  LUNGS: on inspection no signs of respiratory distress, breathing rate appears normal, no obvious gross SOB, gasping or wheezing  CV: no obvious cyanosis  MS: moves all visible extremities without noticeable abnormality  PSYCH/NEURO: pleasant and cooperative, no obvious depression or anxiety, speech and thought processing grossly intact  ASSESSMENT AND PLAN:  Discussed the following assessment and plan:  Cough  -we discussed possible serious and likely etiologies, options for evaluation and workup, limitations of telemedicine visit vs in person visit, treatment, treatment risks and precautions. Pt prefers to treat via telemedicine empirically rather then risking or undertaking an in person visit at this moment. Advised with ongoing issues and the ? Streak of blood would advise inperson eval and CXR. She prefers to try empiric abx treatment first as is trying to avoid inperson exams given the pandemic and increasing rates of COVID19. Discussed various treatment options and she decided on omnicef given her liver condition. She agrees to seek inperson care if no sig improvement in the next few days, if any ongoing chest congestion/tightness or if any further blood or ongoing symptoms.  She needs new PCP and is going to try to get in with an office near her house.    I discussed the assessment and treatment plan with the patient. The patient was provided an opportunity to ask questions and all were answered. The patient agreed with the plan and demonstrated an understanding of the instructions.   The patient was advised to call back or seek an in-person evaluation if the symptoms worsen or if the condition fails to improve as anticipated.   Lucretia Kern, DO

## 2019-09-01 ENCOUNTER — Ambulatory Visit
Admission: EM | Admit: 2019-09-01 | Discharge: 2019-09-01 | Disposition: A | Discharge status: Home / Self Care | Payer: Medicare Other | Attending: Emergency Medicine | Admitting: Emergency Medicine

## 2019-09-01 ENCOUNTER — Other Ambulatory Visit: Payer: Self-pay

## 2019-09-01 ENCOUNTER — Encounter: Payer: Self-pay | Admitting: Emergency Medicine

## 2019-09-01 DIAGNOSIS — R5383 Other fatigue: Secondary | ICD-10-CM | POA: Diagnosis not present

## 2019-09-01 DIAGNOSIS — R0789 Other chest pain: Secondary | ICD-10-CM

## 2019-09-01 NOTE — Discharge Instructions (Signed)
Recommend he go to ER for further evaluation. Recommend follow-up with PCP/GI doctor for further evaluation if you feel esophageal stricture is contributory.

## 2019-09-01 NOTE — ED Triage Notes (Signed)
Pt presents to [facility] for assessment of chest pain since visit on 12/22.  States it has been constant ever since.  Pt states it feels like someone has put something heavy on her chest.  Denies shortness breath, and cough stopped a week after she stopped her steroid.  Pt states hx of vertigo, but denies changes in.

## 2019-09-01 NOTE — ED Provider Notes (Signed)
[facility]    CSN: [id number] Arrival date & time: 09/01/19  1521      History   Chief Complaint Chief Complaint  Patient presents with  . Chest Pain    HPI Brenda [name] is a 74 y.o. female with history of kidney cancer status post resection without chemo or radiation intervention, hyperlipidemia, GERD, esophageal stricture, hepatitis with cirrhosis presenting for persistent chest pain.  Patient previously evaluated on 12/22 for chest pain with concurrent URI symptoms, see HPI below:  "Center chest pain that is constant, without obvious aggravating or alleviating factor. Denies shortness of breath, N/V, diaphoresis. Denies weakness, dizziness, syncope. Denies fever, chills, body aches. Denies abdominal pain, nausea, vomiting, diarrhea. Denies loss of taste/smell. No known sick contact. Former smoker.Marland KitchenMarland KitchenDenies history of heart disease."  Patient reported Covid testing (Negative) and was given prednisone, Flonase at time of discharge.  Patient was compliant with prednisone which improved her cough, though "never really help my chest pain ".  Patient describes chest pain as "like someone sitting on my chest ".  Denies shortness of breath, lightheadedness, palpitations.  Does endorse fatigue.  Patient states has been here since she has had to have her esophagus dilated, and that that pain feels different.  No recent choking, regurgitation, excessive drooling.   Past Medical History:  Diagnosis Date  . Arthritis   . Bronchitis    hx of when smoked   . Cancer ([place]) 04/16/09   kidney cancer - tx by [facility] per her report released from f/u  . Cataracts, bilateral   . Diverticulitis 11/2004  . Endometriosis 1994  . GERD (gastroesophageal reflux disease)    hx hiatal hernia, hx esophageal stricture s/p dilation  . Headache   . Hepatitis   . History of chicken pox   . History of hiatal hernia   . History of measles   . History of mumps as a child   . Hx of  hepatitis    with cirrhosis - per her report from [place] and eval in 2013 at [place]  . Hyperlipidemia   . Left ear pain   . Migraines    pt states vesticular migraines has dizziness in relation   . Numbness and tingling    feet bilat has had for 40 years  . Right knee pain   . Scarlet fever   . Shingles   . Urinary tract bacterial infections    hx of  . Vertigo    associated with headache, intermittent, chronic    Patient Active Problem List   Diagnosis Date Noted  . S/P partial resection of colon 04/10/2015  . Cirrhosis ([place]) 12/08/2014  . Esophageal reflux 12/08/2014  . Esophageal stricture 12/08/2014  . Vertigo 12/08/2014    Past Surgical History:  Procedure Laterality Date  . ABDOMINAL HYSTERECTOMY  1994   TAH/BSO due to endometriosis  . APPENDECTOMY    . CATARACT EXTRACTION W/ INTRAOCULAR LENS IMPLANT Bilateral 05/2015, 04/2015  . DIAGNOSTIC LAPAROSCOPY    . LAPAROSCOPIC PARTIAL COLECTOMY N/A 04/10/2015   Procedure: LAPAROSCOPIC PARTIAL CECTOMY;  Surgeon: Ralene Ok, MD;  Location: [facility];  Service: General;  Laterality: N/A;  . LUNG BIOPSY  1996   Negative  . NEPHRECTOMY  04/16/09   Partial removal due to cancer  . OOPHORECTOMY    . TONSILLECTOMY    . TONSILLECTOMY      OB History    Gravida  0   Para  0   Term  0  Preterm  0   AB  0   Living  0     SAB  0   TAB  0   Ectopic  0   Multiple  0   Live Births  0            Home Medications    Prior to Admission medications   Medication Sig Start Date End Date Taking? Authorizing Provider  cefdinir (OMNICEF) 300 MG capsule Take 1 capsule (300 mg total) by mouth 2 (two) times daily. 08/25/19   Lucretia Kern, DO  cycloSPORINE (RESTASIS) 0.05 % ophthalmic emulsion Apply to eye 2 (two) times daily. 06/02/16   [provider]  Estradiol (YUVAFEM) 10 MCG TABS vaginal tablet Place 1 tablet (10 mcg total) vaginally 2 (two) times a week. 09/16/18   Nunzio Cobbs, MD    fluticasone (FLONASE) 50 MCG/ACT nasal spray Place 2 sprays into both nostrils daily. 08/16/19   Tasia Catchings, Amy V, PA-C  Multiple Vitamin (MULTIVITAMIN) tablet Take 1 tablet by mouth daily.      [provider]  pravastatin (PRAVACHOL) 20 MG tablet TAKE 1 TABLET BY MOUTH EVERY DAY 04/21/19   Lucretia Kern, DO    Family History Family History  Problem Relation Age of Onset  . Cancer Father   . Thyroid cancer Mother   . Cancer Mother        thyroid/ renal cell ca   . Colon cancer Neg Hx   . Breast cancer Neg Hx     Social History Social History   Tobacco Use  . Smoking status: Former Smoker    Packs/day: 1.50    Years: 20.00    Pack years: 30.00    Types: Cigarettes    Quit date: 08/25/1990    Years since quitting: 29.0  . Smokeless tobacco: Never Used  Substance Use Topics  . Alcohol use: No    Comment: Rare  . Drug use: No     Allergies   Codeine, Cottonelle fresh care [attends briefs small], Dexilant [dexlansoprazole], Nitrofurantoin, Other, Ranitidine, Septra [sulfamethoxazole-trimethoprim], and Sulfamethoxazole-trimethoprim   Review of Systems As per HPI   Physical Exam Triage Vital Signs ED Triage Vitals  Enc Vitals Group     BP 09/01/19 1606 (!) 164/85     Pulse Rate 09/01/19 1606 80     Resp 09/01/19 1606 16     Temp 09/01/19 1606 97.6 F (36.4 C)     Temp Source 09/01/19 1606 Temporal     SpO2 09/01/19 1606 98 %     Weight --      Height --      Head Circumference --      Peak Flow --      Pain Score 09/01/19 1605 9     Pain Loc --      Pain Edu? --      Excl. in [place]? --    No data found.  Updated Vital Signs BP (!) 164/85 (BP Location: Right Arm)   Pulse 80   Temp 97.6 F (36.4 C) (Temporal)   Resp 16   LMP 08/25/1992   SpO2 98%   Visual Acuity Right Eye Distance:   Left Eye Distance:   Bilateral Distance:    Right Eye Near:   Left Eye Near:    Bilateral Near:     Physical Exam Constitutional:      General: She is not in  acute distress.    Appearance: She is  well-developed. She is not ill-appearing or diaphoretic.  HENT:     Head: Normocephalic and atraumatic.  Eyes:     General: No scleral icterus.    Pupils: Pupils are equal, round, and reactive to light.  Neck:     Vascular: No JVD.     Trachea: No tracheal deviation.  Cardiovascular:     Rate and Rhythm: Normal rate and regular rhythm.     Pulses:          Carotid pulses are 2+ on the right side and 2+ on the left side.      Radial pulses are 2+ on the right side and 2+ on the left side.     Heart sounds: Heart sounds not distant. No friction rub. No gallop.   Pulmonary:     Effort: Pulmonary effort is normal. No tachypnea, accessory muscle usage or respiratory distress.     Breath sounds: Normal breath sounds. No stridor.  Chest:     Chest wall: No deformity, tenderness or crepitus.  Musculoskeletal:     Cervical back: Neck supple.  Skin:    Capillary Refill: Capillary refill takes less than 2 seconds.     Coloration: Skin is not cyanotic, jaundiced or pale.     Findings: No ecchymosis.  Neurological:     General: No focal deficit present.     Mental Status: She is alert and oriented to person, place, and time.      UC Treatments / Results  Labs (all labs ordered are listed, but only abnormal results are displayed) Labs Reviewed - No data to display  EKG   Radiology No results found.  Procedures Procedures (including critical care time)  Medications Ordered in UC Medications - No data to display  Initial Impression / Assessment and Plan / UC Course  I have reviewed the triage vital signs and the nursing notes.  Pertinent labs & imaging results that were available during my care of the patient were reviewed by me and considered in my medical decision making (see chart for details).     Patient afebrile, nontoxic in office today.  Patient is hypertensive as compared to last visit: Denies history of this and does not take  any antihypertensives.  EKG done in office, reviewed by me and compared to previous from 2016: Normal sinus rhythm with ventricular rate of 82 bpm.  No QTC prolongation.  Automatic reading shows short PR of 106 MS, though this provider does not appreciate this on physical EKG printout.  No ST elevation.  All waveforms stable as compared to previous.  Reviewed findings patient who verbalized understanding.  Discussed the EKG is limited in cardiac rule out which would require going to ER.  Patient feels that this may be related to her esophagus, which is provider states would need further work-up with GI/PCP for possible repeat dilation.  Given patient complaining of someone "sitting on my chest", fatigue, age, comorbidities, this provider recommended patient go to ER.  Electing to self transport in stable condition. Final Clinical Impressions(s) / UC Diagnoses   Final diagnoses:  Other chest pain     Discharge Instructions     Recommend he go to ER for further evaluation. Recommend follow-up with PCP/GI doctor for further evaluation if you feel esophageal stricture is contributory.    ED Prescriptions    None     PDMP not reviewed this encounter.   Neldon Mc [place], [place] 09/01/19 1819

## 2019-09-16 ENCOUNTER — Ambulatory Visit: Payer: Medicare Other | Attending: Internal Medicine

## 2019-09-16 DIAGNOSIS — Z23 Encounter for immunization: Secondary | ICD-10-CM | POA: Insufficient documentation

## 2019-09-16 NOTE — Progress Notes (Signed)
   Covid-19 Vaccination Clinic  Name:  Brenda [name]    MRN: [id number] DOB: 1945-09-27  09/16/2019  [name] was observed post Covid-19 immunization for 15 minutes without incidence. She was provided with Vaccine Information Sheet and instruction to access the V-Safe system.   [name] was instructed to call 911 with any severe reactions post vaccine: Marland Kitchen Difficulty breathing  . Swelling of your face and throat  . A fast heartbeat  . A bad rash all over your body  . Dizziness and weakness    Immunizations Administered    Name Date Dose VIS Date Route   Pfizer COVID-19 Vaccine 09/16/2019  1:28 PM 0.3 mL 08/05/2019 Intramuscular   Manufacturer: [place]   Lot: [id number]   [place]: [id number]

## 2019-09-20 ENCOUNTER — Other Ambulatory Visit: Payer: Self-pay | Admitting: Family Medicine

## 2019-09-20 ENCOUNTER — Other Ambulatory Visit: Payer: Self-pay | Admitting: Obstetrics and Gynecology

## 2019-09-20 ENCOUNTER — Telehealth: Payer: Self-pay | Admitting: Family Medicine

## 2019-09-20 ENCOUNTER — Other Ambulatory Visit: Payer: Self-pay

## 2019-09-20 ENCOUNTER — Ambulatory Visit (INDEPENDENT_AMBULATORY_CARE_PROVIDER_SITE_OTHER): Payer: Medicare Other | Admitting: Internal Medicine

## 2019-09-20 DIAGNOSIS — M25561 Pain in right knee: Secondary | ICD-10-CM

## 2019-09-20 DIAGNOSIS — R232 Flushing: Secondary | ICD-10-CM

## 2019-09-20 DIAGNOSIS — Z01419 Encounter for gynecological examination (general) (routine) without abnormal findings: Secondary | ICD-10-CM

## 2019-09-20 DIAGNOSIS — G8929 Other chronic pain: Secondary | ICD-10-CM | POA: Diagnosis not present

## 2019-09-20 MED ORDER — PRAVASTATIN SODIUM 20 MG PO TABS: 20.0000 mg | ORAL_TABLET | Freq: Every day | ORAL | 0 refills | Status: DC

## 2019-09-20 NOTE — Progress Notes (Signed)
Virtual Visit via Telephone Note Due to current restrictions/limitations of in-office visits due to the COVID-19 pandemic, this scheduled clinical appointment was converted to a telehealth visit  I connected with Brenda [name] on 09/20/19 at 11:36 a.m by telephone and verified that I am speaking with the correct person using two identifiers. I am in my office.  The patient is at home.  Only the patient and myself participated in this encounter.  I discussed the limitations, risks, security and privacy concerns of performing an evaluation and management service by telephone and the availability of in person appointments. I also discussed with the patient that there may be a patient responsible charge related to this service. The patient expressed understanding and agreed to proceed.   History of Present Illness: Pt with hx of HTN, HL, cirrhosis, GERD,chronic vertigo, hot flashes  Previous PCP was Dr. Maudie Mercury at [place].  Last eval 4 wks ago.  Changing provider because it takes her 40 minutes to travel to appts there.  [facility] is closer to her.     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  Followed by [name].  Has f/u appt next mth Hx of chronic vertigo was followed by neurology at [facility].  No longer bother with this problem.  Hot flashes:  Not well controlled on current biweekly estrogen vaginal inserts. Was on estrogen patches before which worked well but were d/c due to cancer risk.  Had hysterectomy in her 86s.  Followed by GYN.  Has appt with her next week.    C/o constant 5/10 pain in RT knee x 6-8 mths which she thinks is arthritis.  No swelling.  No stiffness. Not on any med for it.  Walks unassisted.  No falls.  Husband has some Voltaren gel at home but she has not tried that.    Observations/Objective: No direct observation done as this is a telephone encounter.  Assessment and Plan: 1. Chronic pain of right knee -Probably arthritis.  I offered for her to come  in and have an x-ray done but patient declined.  I recommended trying Voltaren gel which can be purchased over-the-counter.  Patient states they have a lot at home which her husband uses.  She will try that first and see how she does and if it does not help then she would consider having the x-ray done.  2. Hot flashes Followed by gynecology and on estrogen vaginal inserts.  She has a follow-up appointment with her gynecologist next week.  I see that she had TSH done 2 years ago which was normal.   Follow Up Instructions: 2-3 mths with Dr. Juleen China.  She will need baseline blood tests on that visit as she has not had blood test done in over a year.   I discussed the assessment and treatment plan with the patient. The patient was provided an opportunity to ask questions and all were answered. The patient agreed with the plan and demonstrated an understanding of the instructions.   The patient was advised to call back or seek an in-person evaluation if the symptoms worsen or if the condition fails to improve as anticipated.  I provided 16 minutes of non-face-to-face time during this encounter.   Karle Plumber, MD

## 2019-09-20 NOTE — Telephone Encounter (Signed)
Left message for patient to call Estill Bamberg, CMA or triage nurse.(she has AEX 09-26-19, but last MMG 07-28-18 Neg/BiRads1).

## 2019-09-20 NOTE — Telephone Encounter (Signed)
Patient is calling regarding refill request for Yuvafem 10 MCG tabs. Patient confirmed pharmacy as [facility] on [address].

## 2019-09-20 NOTE — Telephone Encounter (Signed)
Rx done. 

## 2019-09-20 NOTE — Telephone Encounter (Signed)
Patient needs a refill on Provastatin.  Patient is completely out.  [facility] [address]

## 2019-09-21 NOTE — Telephone Encounter (Signed)
Patient is returning a call to [facility] or triage regarding her refill request.

## 2019-09-22 ENCOUNTER — Other Ambulatory Visit: Payer: Self-pay | Admitting: Obstetrics and Gynecology

## 2019-09-22 DIAGNOSIS — Z1231 Encounter for screening mammogram for malignant neoplasm of breast: Secondary | ICD-10-CM

## 2019-09-22 MED ORDER — ESTRADIOL 10 MCG VA TABS: 10.0000 ug | ORAL_TABLET | VAGINAL | 0 refills | Status: DC

## 2019-09-22 NOTE — Telephone Encounter (Signed)
Spoke to pt. Pt states out of Yuvafem. Had last MMG in 07/2018. Pt states cancelled MMG for 08/2019 due to Covid. Pt encouraged to call back to scheduled MMG. Pt agreeable. Will review with Dr Quincy Simmonds for [facility] RF. Has AEX on 09/26/2019 with Dr Quincy Simmonds and is aware.   Routing to Dr Quincy Simmonds for refill request. #24 tablets, 0RF until updated MMG. Pt aware.

## 2019-09-22 NOTE — Progress Notes (Signed)
74 y.o. [place] Mar[place] Caucasian female here for annual exam.  She ran out of Vagifem.  Her hot flashes stopped and so did her urinary incontinence since she ran out.  She is voiding well.  She wants to stay off Vagifem.  Not sexually active.   Having her second Covid vaccine next week.   PCP:  Colin Benton, MD   Patient's last menstrual period was 08/25/1992.           Sexually active: No.  The current method of family planning is post menopausal status/Hysterectomy.    Exercising: No.  The patient does not participate in regular exercise at present. Smoker:  no  Health Maintenance: Pap: : 04/2008 Neg History of abnormal Pap:  no MMG:07-28-18 3D/Neg/density B/Birads1--appt.09/27/19 Colonoscopy 2018/2019 next due 3 years--patient knows she needs to schedule BMD: 11-22-09  Result :T Score, 0.6 Spine / 0.5 Left Femur Neck TDaP:  08-21-11 Gardasil:   no HIV:no Hep C: hx of hepatitis in past Screening Labs:  PCP.  Flu vaccine:  Completed.    reports that she quit smoking about 29 years ago. Her smoking use included cigarettes. She has a 30.00 pack-year smoking history. She has never used smokeless tobacco. She reports that she does not drink alcohol or use drugs.  Past Medical History:  Diagnosis Date  . Arthritis   . Bronchitis    hx of when smoked   . Cancer ([place]) 04/16/09   kidney cancer - tx by [facility] per her report released from f/u  . Cataracts, bilateral   . Diverticulitis 11/2004  . Endometriosis 1994  . GERD (gastroesophageal reflux disease)    hx hiatal hernia, hx esophageal stricture s/p dilation  . Headache   . Hepatitis   . History of chicken pox   . History of hiatal hernia   . History of measles   . History of mumps as a child   . Hx of hepatitis    with cirrhosis - per her report from [place] and eval in 2013 at [place]  . Hyperlipidemia   . Left ear pain   . Migraines    pt states vesticular migraines has dizziness in relation   . Numbness  and tingling    feet bilat has had for 40 years  . Right knee pain   . Scarlet fever   . Shingles   . Urinary tract bacterial infections    hx of  . Vertigo    associated with headache, intermittent, chronic    Past Surgical History:  Procedure Laterality Date  . ABDOMINAL HYSTERECTOMY  1994   TAH/BSO due to endometriosis  . APPENDECTOMY    . CATARACT EXTRACTION W/ INTRAOCULAR LENS IMPLANT Bilateral 05/2015, 04/2015  . DIAGNOSTIC LAPAROSCOPY    . LAPAROSCOPIC PARTIAL COLECTOMY N/A 04/10/2015   Procedure: LAPAROSCOPIC PARTIAL CECTOMY;  Surgeon: Ralene Ok, MD;  Location: [facility];  Service: General;  Laterality: N/A;  . LUNG BIOPSY  1996   Negative  . NEPHRECTOMY  04/16/09   Partial removal due to cancer  . OOPHORECTOMY    . TONSILLECTOMY    . TONSILLECTOMY      Current Outpatient Medications  Medication Sig Dispense Refill  . cycloSPORINE (RESTASIS) 0.05 % ophthalmic emulsion Apply to eye 2 (two) times daily.    . Estradiol (YUVAFEM) 10 MCG TABS vaginal tablet Place 1 tablet (10 mcg total) vaginally 2 (two) times a week. 8 tablet 0  . fluticasone (FLONASE) 50 MCG/ACT nasal spray Place 2 sprays into  both nostrils daily. 1 g 0  . Multiple Vitamin (MULTIVITAMIN) tablet Take 1 tablet by mouth daily.      Marland Kitchen omeprazole (PRILOSEC) 20 MG capsule Take 1 capsule by mouth as needed.    . pravastatin (PRAVACHOL) 20 MG tablet Take 1 tablet (20 mg total) by mouth daily. 30 tablet 0   No current facility-administered medications for this visit.    Family History  Problem Relation Age of Onset  . Cancer Father   . Thyroid cancer Mother   . Cancer Mother        thyroid/ renal cell ca   . Colon cancer Neg Hx   . Breast cancer Neg Hx     Review of Systems  Genitourinary:       Urinary incontinence  All other systems reviewed and are negative.   Exam:   BP 120/64   Pulse 90   Temp (!) 97.2 F (36.2 C) (Temporal)   Resp 16   Ht 5' 2.75" (1.594 m)   Wt 158 lb 9.6 oz (71.9  kg)   LMP 08/25/1992   BMI 28.32 kg/m     General appearance: alert, cooperative and appears stated age Head: normocephalic, without obvious abnormality, atraumatic Neck: no adenopathy, supple, symmetrical, trachea midline and thyroid normal to inspection and palpation Lungs: clear to auscultation bilaterally Breasts: normal appearance, no masses or tenderness, No nipple retraction or dimpling, No nipple discharge or bleeding, No axillary adenopathy Heart: regular rate and rhythm Abdomen: soft, non-tender; no masses, no organomegaly Extremities: extremities normal, atraumatic, no cyanosis or edema Skin: skin color, texture, turgor normal. No rashes or lesions Lymph nodes: cervical, supraclavicular, and axillary nodes normal. Neurologic: grossly normal  Pelvic: External genitalia:  no lesions              No abnormal inguinal nodes palpated.              Urethra:  normal appearing urethra with no masses, tenderness or lesions              Bartholins and Skenes: normal                 Vagina: normal appearing vagina with normal color and discharge, no lesions              Cervix: absent              Pap taken: No. Bimanual Exam:  Uterus:  absent              Adnexa: no mass, fullness, tenderness              Rectal exam: Yes.  .  Confirms.              Anus:  normal sphincter tone, no lesions  Chaperone was present for exam.  Assessment:   Well woman visit with normal exam. Status post TAH/BSO for endometriosis. Recently off Vagifem.  Mixed incontinence.  Status post partial colectomy/appendectomy due to neoplastic polyp found on colonoscopy. Status post partial nephrectomy for cancer.  Plan: Mammogram screening discussed. Self breast awareness reviewed. Pap and HR HPV as above. Guidelines for Calcium, Vitamin D, regular exercise program including cardiovascular and weight bearing exercise. She will continue off Vagifem.  Labs with PCP.  She will contact Gi about her  colonoscopy.  Follow up annually and prn.   After visit summary provided.

## 2019-09-26 ENCOUNTER — Encounter: Payer: Self-pay | Admitting: Obstetrics and Gynecology

## 2019-09-26 ENCOUNTER — Ambulatory Visit (INDEPENDENT_AMBULATORY_CARE_PROVIDER_SITE_OTHER): Payer: Medicare Other | Admitting: Obstetrics and Gynecology

## 2019-09-26 ENCOUNTER — Other Ambulatory Visit: Payer: Self-pay

## 2019-09-26 ENCOUNTER — Ambulatory Visit: Payer: Medicare Other

## 2019-09-26 VITALS — BP 120/64 | HR 90 | Temp 97.2°F | Resp 16 | Ht 62.75 in | Wt 158.6 lb

## 2019-09-26 DIAGNOSIS — Z124 Encounter for screening for malignant neoplasm of cervix: Secondary | ICD-10-CM | POA: Diagnosis not present

## 2019-09-26 DIAGNOSIS — Z01419 Encounter for gynecological examination (general) (routine) without abnormal findings: Secondary | ICD-10-CM | POA: Diagnosis not present

## 2019-09-26 NOTE — Patient Instructions (Signed)

## 2019-09-27 ENCOUNTER — Ambulatory Visit
Admission: RE | Admit: 2019-09-27 | Discharge: 2019-09-27 | Disposition: A | Discharge status: Home / Self Care | Payer: Medicare Other | Source: Ambulatory Visit | Attending: Obstetrics and Gynecology | Admitting: Obstetrics and Gynecology

## 2019-09-27 DIAGNOSIS — Z1231 Encounter for screening mammogram for malignant neoplasm of breast: Secondary | ICD-10-CM | POA: Diagnosis not present

## 2019-10-07 ENCOUNTER — Ambulatory Visit: Payer: Medicare Other | Attending: Internal Medicine

## 2019-10-07 DIAGNOSIS — Z23 Encounter for immunization: Secondary | ICD-10-CM | POA: Insufficient documentation

## 2019-10-07 NOTE — Progress Notes (Signed)
   Covid-19 Vaccination Clinic  Name:  ANNAHI PHAIR    MRN: [id number] DOB: 02-06-1946  10/07/2019  Ms. Becerril was observed post Covid-19 immunization for 15 minutes without incidence. She was provided with Vaccine Information Sheet and instruction to access the V-Safe system.   Ms. Dalton was instructed to call 911 with any severe reactions post vaccine: Marland Kitchen Difficulty breathing  . Swelling of your face and throat  . A fast heartbeat  . A bad rash all over your body  . Dizziness and weakness    Immunizations Administered    Name Date Dose VIS Date Route   Pfizer COVID-19 Vaccine 10/07/2019 10:21 AM 0.3 mL 08/05/2019 Intramuscular   Manufacturer: [place]   Lot: [id number]   [place]: [id number]

## 2019-10-17 ENCOUNTER — Other Ambulatory Visit: Payer: Self-pay | Admitting: Family Medicine

## 2019-10-17 ENCOUNTER — Other Ambulatory Visit: Payer: Self-pay | Admitting: Obstetrics and Gynecology

## 2019-10-17 DIAGNOSIS — Z01419 Encounter for gynecological examination (general) (routine) without abnormal findings: Secondary | ICD-10-CM

## 2019-11-08 ENCOUNTER — Other Ambulatory Visit: Payer: Self-pay | Admitting: Obstetrics and Gynecology

## 2019-11-08 DIAGNOSIS — Z01419 Encounter for gynecological examination (general) (routine) without abnormal findings: Secondary | ICD-10-CM

## 2019-11-11 ENCOUNTER — Other Ambulatory Visit: Payer: Self-pay | Admitting: Obstetrics and Gynecology

## 2019-11-11 DIAGNOSIS — Z01419 Encounter for gynecological examination (general) (routine) without abnormal findings: Secondary | ICD-10-CM

## 2019-11-18 ENCOUNTER — Telehealth: Payer: Self-pay

## 2019-11-18 NOTE — Telephone Encounter (Signed)

## 2019-11-18 NOTE — Patient Instructions (Signed)
Thank you for choosing Primary Care at [facility] to be your medical home!    Brenda [name] was seen by Melina Schools, DO today.   Tarri Glenn Kavanaugh's primary care provider is Phill Myron, DO.   For the best care possible, you should try to see Phill Myron, DO whenever you come to the clinic.   We look forward to seeing you again soon!  If you have any questions about your visit today, please call us at [phone] or feel free to reach your primary care provider via [place].

## 2019-11-21 ENCOUNTER — Other Ambulatory Visit: Payer: Self-pay

## 2019-11-21 ENCOUNTER — Encounter: Payer: Self-pay | Admitting: Internal Medicine

## 2019-11-21 ENCOUNTER — Ambulatory Visit (INDEPENDENT_AMBULATORY_CARE_PROVIDER_SITE_OTHER): Payer: Medicare Other | Admitting: Internal Medicine

## 2019-11-21 VITALS — BP 119/73 | HR 92 | Temp 97.2°F | Resp 17 | Wt 157.0 lb

## 2019-11-21 DIAGNOSIS — M25562 Pain in left knee: Secondary | ICD-10-CM

## 2019-11-21 DIAGNOSIS — E042 Nontoxic multinodular goiter: Secondary | ICD-10-CM | POA: Diagnosis not present

## 2019-11-21 DIAGNOSIS — M25561 Pain in right knee: Secondary | ICD-10-CM

## 2019-11-21 DIAGNOSIS — G8929 Other chronic pain: Secondary | ICD-10-CM

## 2019-11-21 DIAGNOSIS — R232 Flushing: Secondary | ICD-10-CM | POA: Diagnosis not present

## 2019-11-21 DIAGNOSIS — Z13228 Encounter for screening for other metabolic disorders: Secondary | ICD-10-CM

## 2019-11-21 DIAGNOSIS — E7841 Elevated Lipoprotein(a): Secondary | ICD-10-CM

## 2019-11-21 NOTE — Progress Notes (Signed)
Mult  Subjective:    Brenda [name] - 75 y.o. female MRN [id number]  Date of birth: Jun 26, 1946  HPI  Brenda [name] is here for f/u HLD.  HYPERLIPIDEMIA  Symptoms Chest pain on exertion:  no    Leg claudication:   no  Medication Monitoring Compliance- No. She has previously been on Pravastatin. No longer taking---has been a while.  Right upper quadrant pain- no   Muscle aches- no    Knee Pain: Bothers her constantly with any movement. Right started about 9 months ago. Left started 3 months ago. Believes it is OA. Uses some arthritis pain relief gel but doesn't have a ton of improvement with this. Has not tried any PO meds as is not a big medication user. She denies weakness or lack of joint stability. No prior knee imaging. No known injuries. Causes pain with movement but does not limit her ability to do daily activities.   Health Maintenance:  There are no preventive care reminders to display for this patient.  -  reports that she quit smoking about 29 years ago. Her smoking use included cigarettes. She has a 30.00 pack-year smoking history. She has never used smokeless tobacco. - Review of Systems: Per HPI. - Past Medical History: Patient Active Problem List   Diagnosis Date Noted  . S/P partial resection of colon 04/10/2015  . Cirrhosis ([place]) 12/08/2014  . Esophageal reflux 12/08/2014  . Esophageal stricture 12/08/2014  . Vertigo 12/08/2014   - Medications: reviewed and updated   Objective:   Physical Exam BP 119/73   Pulse 92   Temp (!) 97.2 F (36.2 C) (Temporal)   Resp 17   Wt 157 lb (71.2 kg)   LMP 08/25/1992   SpO2 97%   BMI 28.03 kg/m  Physical Exam  Constitutional: She is oriented to person, place, and time and well-developed, well-nourished, and in no distress. No distress.  Cardiovascular: Normal rate.  Pulmonary/Chest: Effort normal. No respiratory distress.  Musculoskeletal:        General: Normal range of motion.     Comments: Knees without  edema, erythema or increased warmth. Crepitus present with passive flexion/extension bilaterally, right > left. Neg anterior drawer bilaterally. No pain or laxity with valgus or varus stress testing bilaterally.   Neurological: She is alert and oriented to person, place, and time.  Skin: Skin is warm and dry. She is not diaphoretic.  Psychiatric: Affect and judgment normal.       Assessment & Plan:   1. Elevated lipoprotein(a) Not currently on statin therapy. Not fasting today but elected to draw labs.  - Lipid panel  2. Screening for metabolic disorder - Comprehensive metabolic panel  3. Hot flashes Patient reports hot flashes started 40 years after having hysterectomy with oophorectomy. Will obtain CBC and TSH to evaluate further.  - CBC - TSH  4. Chronic pain of both knees Suspect related to OA. Patient declines imaging at present. Discussed option of knee corticosteroid injections as well as PO NSAID/Tylenol. Patient would like to consider her options further as currently not limiting her activity. Could also consider PT as an option.     Phill Myron, D.O. 11/21/2019, 1:49 PM Primary Care at [facility]

## 2019-11-22 ENCOUNTER — Other Ambulatory Visit: Payer: Self-pay | Admitting: Internal Medicine

## 2019-11-22 DIAGNOSIS — E785 Hyperlipidemia, unspecified: Secondary | ICD-10-CM

## 2019-11-22 LAB — COMPREHENSIVE METABOLIC PANEL WITH GFR
ALT: 22 [IU]/L (ref 0–32)
AST: 53 [IU]/L — ABNORMAL HIGH (ref 0–40)
Albumin/Globulin Ratio: 1.4 (ref 1.2–2.2)
Albumin: 4 g/dL (ref 3.7–4.7)
Alkaline Phosphatase: 85 [IU]/L (ref 39–117)
BUN/Creatinine Ratio: 13 (ref 12–28)
BUN: 11 mg/dL (ref 8–27)
Bilirubin Total: 0.3 mg/dL (ref 0.0–1.2)
CO2: 23 mmol/L (ref 20–29)
Calcium: 9 mg/dL (ref 8.7–10.3)
Chloride: 101 mmol/L (ref 96–106)
Creatinine, Ser: 0.87 mg/dL (ref 0.57–1.00)
GFR calc Af Amer: 76 mL/min/{1.73_m2}
GFR calc non Af Amer: 66 mL/min/{1.73_m2}
Globulin, Total: 2.9 g/dL (ref 1.5–4.5)
Glucose: 110 mg/dL — ABNORMAL HIGH (ref 65–99)
Potassium: 4.4 mmol/L (ref 3.5–5.2)
Sodium: 138 mmol/L (ref 134–144)
Total Protein: 6.9 g/dL (ref 6.0–8.5)

## 2019-11-22 LAB — CBC
Hematocrit: 40 % (ref 34.0–46.6)
Hemoglobin: 13.3 g/dL (ref 11.1–15.9)
MCH: 30.1 pg (ref 26.6–33.0)
MCHC: 33.3 g/dL (ref 31.5–35.7)
MCV: 91 fL (ref 79–97)
Platelets: 261 10*3/uL (ref 150–450)
RBC: 4.42 x10E6/uL (ref 3.77–5.28)
RDW: 13.4 % (ref 11.7–15.4)
WBC: 6.8 10*3/uL (ref 3.4–10.8)

## 2019-11-22 LAB — TSH: TSH: 0.923 u[IU]/mL (ref 0.450–4.500)

## 2019-11-22 LAB — LIPID PANEL
Chol/HDL Ratio: 5.1 ratio — ABNORMAL HIGH (ref 0.0–4.4)
Cholesterol, Total: 190 mg/dL (ref 100–199)
HDL: 37 mg/dL — ABNORMAL LOW (ref >39)
LDL Chol Calc (NIH): 118 mg/dL — ABNORMAL HIGH (ref 0–99)
Triglycerides: 200 mg/dL — ABNORMAL HIGH (ref 0–149)
VLDL Cholesterol Cal: 35 mg/dL (ref 5–40)

## 2019-11-22 MED ORDER — ASPIRIN 81 MG PO TBEC: 81.0000 mg | DELAYED_RELEASE_TABLET | Freq: Every day | ORAL | 12 refills | Status: DC

## 2019-11-22 MED ORDER — ATORVASTATIN CALCIUM 40 MG PO TABS: 40.0000 mg | ORAL_TABLET | Freq: Every day | ORAL | 3 refills | Status: DC

## 2019-11-29 NOTE — Progress Notes (Signed)
Patient notified of results & recommendations. Expressed understanding. She states that she will start taking the Aspirin 81 mg but does not want to take a Statin. She says that she will work on diet & exercise as well as starting to take fish oil.

## 2020-02-06 ENCOUNTER — Other Ambulatory Visit: Payer: Self-pay | Admitting: Internal Medicine

## 2020-02-06 ENCOUNTER — Telehealth: Payer: Self-pay | Admitting: Internal Medicine

## 2020-02-06 MED ORDER — MICONAZOLE NITRATE 2 % VA CREA: 1.0000 | TOPICAL_CREAM | Freq: Every day | VAGINAL | 0 refills | Status: DC

## 2020-02-06 NOTE — Telephone Encounter (Signed)
Pt called asking for [facility] infection [facility] [address]

## 2020-02-06 NOTE — Telephone Encounter (Signed)
I sent in Miconazole to be used nightly for 7 nights to treat yeast infection.   Phill Myron, D.O. Primary Care at [facility]  02/06/2020, 4:12 PM

## 2020-02-07 ENCOUNTER — Telehealth (INDEPENDENT_AMBULATORY_CARE_PROVIDER_SITE_OTHER): Payer: Medicare Other | Admitting: Internal Medicine

## 2020-02-07 ENCOUNTER — Encounter: Payer: Self-pay | Admitting: Internal Medicine

## 2020-02-07 ENCOUNTER — Other Ambulatory Visit (HOSPITAL_COMMUNITY)
Admission: RE | Admit: 2020-02-07 | Discharge: 2020-02-07 | Disposition: A | Discharge status: Home / Self Care | Payer: Medicare Other | Source: Ambulatory Visit | Attending: Internal Medicine | Admitting: Internal Medicine

## 2020-02-07 ENCOUNTER — Telehealth: Payer: Self-pay | Admitting: Internal Medicine

## 2020-02-07 DIAGNOSIS — R102 Pelvic and perineal pain: Secondary | ICD-10-CM | POA: Insufficient documentation

## 2020-02-07 DIAGNOSIS — R399 Unspecified symptoms and signs involving the genitourinary system: Secondary | ICD-10-CM

## 2020-02-07 LAB — POCT URINALYSIS DIP (CLINITEK)
Bilirubin, UA: NEGATIVE
Blood, UA: NEGATIVE
Glucose, UA: NEGATIVE mg/dL
Ketones, POC UA: NEGATIVE mg/dL
Leukocytes, UA: NEGATIVE
Nitrite, UA: NEGATIVE
POC PROTEIN,UA: NEGATIVE
Spec Grav, UA: <=1.005 — AB (ref 1.010–1.025)
Urobilinogen, UA: 0.2 E.U./dL
pH, UA: 6 (ref 5.0–8.0)

## 2020-02-07 MED ORDER — CEPHALEXIN 500 MG PO CAPS: 500.0000 mg | ORAL_CAPSULE | Freq: Four times a day (QID) | ORAL | 0 refills | Status: DC

## 2020-02-07 NOTE — Telephone Encounter (Signed)
Called pt lvm .

## 2020-02-07 NOTE — Progress Notes (Signed)
Virtual Visit via Telephone Note  I connected with Brenda [name], on 02/07/2020 at 2:59 PM by telephone due to the COVID-19 pandemic and verified that I am speaking with the correct person using two identifiers.   Consent: I discussed the limitations, risks, security and privacy concerns of performing an evaluation and management service by telephone and the availability of in person appointments. I also discussed with the patient that there may be a patient responsible charge related to this service. The patient expressed understanding and agreed to proceed.   Location of Patient: Home   Location of Provider: Clinic    Persons participating in Telemedicine visit: [facility] Dr. Juleen China      History of Present Illness: Patient has an acute visit for concerns about UTI. Thought it was initially just a yeast infection. Really "hurts down there". Positive for dysuria. Has been occurring for 3 days. No hematuria. Urinary frequency has been occurring. She wears a pad for urinary leakage all the time and that makes keeping up with hygiene/staying dry difficult at times. No fevers or vomiting. No flank pain.    Past Medical History:  Diagnosis Date  . Arthritis   . Bronchitis    hx of when smoked   . Cancer ([place]) 04/16/09   kidney cancer - tx by [facility] per her report released from f/u  . Cataracts, bilateral   . Diverticulitis 11/2004  . Endometriosis 1994  . GERD (gastroesophageal reflux disease)    hx hiatal hernia, hx esophageal stricture s/p dilation  . Headache   . Hepatitis   . History of chicken pox   . History of hiatal hernia   . History of measles   . History of mumps as a child   . Hx of hepatitis    with cirrhosis - per her report from [place] and eval in 2013 at [place]  . Hyperlipidemia   . Left ear pain   . Migraines    pt states vesticular migraines has dizziness in relation   . Numbness and tingling    feet bilat has had  for 40 years  . Right knee pain   . Scarlet fever   . Shingles   . Urinary tract bacterial infections    hx of  . Vertigo    associated with headache, intermittent, chronic   Allergies  Allergen Reactions  . Codeine     REACTION: nausea---CNS side effects  . [place] [employer] [Attends Briefs Small]   . Dexilant [Dexlansoprazole] Diarrhea  . Nitrofurantoin     REACTION: sick--? hepatitis  . Other     Metal staples caused swelling and infection   . Ranitidine Diarrhea  . Septra [Sulfamethoxazole-Trimethoprim]   . Sulfamethoxazole-Trimethoprim Other (See Comments)    Current Outpatient Medications on File Prior to Visit  Medication Sig Dispense Refill  . aspirin (EC-81 ASPIRIN) 81 MG EC tablet Take 1 tablet (81 mg total) by mouth daily. Swallow whole. 30 tablet 12  . atorvastatin (LIPITOR) 40 MG tablet Take 1 tablet (40 mg total) by mouth daily. 90 tablet 3  . cycloSPORINE (RESTASIS) 0.05 % ophthalmic emulsion Apply to eye 2 (two) times daily.    . Multiple Vitamin (MULTIVITAMIN) tablet Take 1 tablet by mouth daily.      Marland Kitchen omeprazole (PRILOSEC) 20 MG capsule Take 1 capsule by mouth as needed.    . miconazole (MONISTAT 7) 2 % vaginal cream Place 1 Applicatorful vaginally at bedtime. (Patient not taking: Reported on  02/07/2020) 45 g 0   No current facility-administered medications on file prior to visit.    Observations/Objective: NAD. Speaking clearly.  Work of breathing normal.  Alert and oriented. Mood appropriate.   Assessment and Plan: 1. UTI symptoms Sounds consistent with acute cystitis. Patient does not have any symptoms concerning for complicated infection or pyelonephritis. Will treat presumptively with Keflex while awaiting culture results. Reviewed last culture was E. Coli in 2016 resistant only to Bactrim. Will adjust antibiotic therapy as indicated pending sensitivity data.  - POCT URINALYSIS DIP (CLINITEK) - Urine Culture - cephALEXin (KEFLEX) 500 MG  capsule; Take 1 capsule (500 mg total) by mouth 4 (four) times daily.  Dispense: 20 capsule; Refill: 0  2. Vaginal pain Sent in Rx for Miconazole yesterday. Will evaluate for presence of yeast and other etiologies of vaginal discomfort although this may all be related to acute cystitis.  - Cervicovaginal ancillary only   Follow Up Instructions: Labs today and PRN/routine medical care    I discussed the assessment and treatment plan with the patient. The patient was provided an opportunity to ask questions and all were answered. The patient agreed with the plan and demonstrated an understanding of the instructions.   The patient was advised to call back or seek an in-person evaluation if the symptoms worsen or if the condition fails to improve as anticipated.     I provided 12 minutes total of non-face-to-face time during this encounter including median intraservice time, reviewing previous notes, investigations, ordering medications, medical decision making, coordinating care and patient verbalized understanding at the end of the visit.    Phill Myron, D.O. Primary Care at [facility]  02/07/2020, 2:59 PM

## 2020-02-07 NOTE — Telephone Encounter (Signed)
Pt states she has an uti and is requesting medication be sent to the pharmacy. Pt does not want to schedule an appt and requested a message be sent to Dr. Maudie Mercury. Thanks

## 2020-02-08 NOTE — Telephone Encounter (Signed)
Spoke to pt and she stated that she has a new provider now and they saw her for the issue. No further action needed.

## 2020-02-09 LAB — CERVICOVAGINAL ANCILLARY ONLY
Bacterial Vaginitis (gardnerella): NEGATIVE
Candida Glabrata: NEGATIVE
Candida Vaginitis: NEGATIVE
Chlamydia: NEGATIVE
Comment: NEGATIVE
Comment: NEGATIVE
Comment: NEGATIVE
Comment: NEGATIVE
Comment: NEGATIVE
Comment: NORMAL
Neisseria Gonorrhea: NEGATIVE
Trichomonas: NEGATIVE

## 2020-02-10 LAB — URINE CULTURE

## 2020-02-10 NOTE — Progress Notes (Signed)
Patient notified of results & recommendations. Expressed understanding.

## 2020-05-03 ENCOUNTER — Telehealth: Payer: Self-pay

## 2020-05-03 ENCOUNTER — Ambulatory Visit: Payer: Medicare Other | Admitting: Obstetrics and Gynecology

## 2020-05-03 NOTE — Telephone Encounter (Signed)
Patient is needing to be seen for yeast infection. Patient had a scheduled appointment for (05/03/20) but came in late.

## 2020-05-03 NOTE — Telephone Encounter (Signed)
Spoke with patient. Patient reports vagina feels raw, some external itching. Denies vaginal d/c, odor, urinary symptoms or bleeding. Reports she did try something OTC, is unsure what it was. No change in symptoms.   OV scheduled for 9/13 at 11:30am with [name]. Reviewed use of coconut oil in meantime for comfort.   Last AEX 09/26/19  Routing to provider for final review. Patient is agreeable to disposition. Will close encounter.

## 2020-05-07 ENCOUNTER — Ambulatory Visit: Payer: Self-pay | Admitting: Obstetrics and Gynecology

## 2020-05-07 ENCOUNTER — Telehealth: Payer: Self-pay

## 2020-05-07 NOTE — Progress Notes (Deleted)
GYNECOLOGY  VISIT   HPI: 74 y.o.   Married  Caucasian  female   G0P0000 with Patient's last menstrual period was 08/25/1992.   here for vaginitis     GYNECOLOGIC HISTORY: Patient's last menstrual period was 08/25/1992. Contraception:  Hysterectomy Menopausal hormone therapy:  none Last mammogram:  09/27/19 BIRADS 1 negative/density c Last pap smear:   04/2008 Negative        OB History    Gravida  0   Para  0   Term  0   Preterm  0   AB  0   Living  0     SAB  0   TAB  0   Ectopic  0   Multiple  0   Live Births  0              Patient Active Problem List   Diagnosis Date Noted  . Hyperlipidemia 11/22/2019  . S/P partial resection of colon 04/10/2015  . Cirrhosis ([place]) 12/08/2014  . Esophageal reflux 12/08/2014  . Esophageal stricture 12/08/2014  . Vertigo 12/08/2014    Past Medical History:  Diagnosis Date  . Arthritis   . Bronchitis    hx of when smoked   . Cancer ([place]) 04/16/09   kidney cancer - tx by [facility] per her report released from f/u  . Cataracts, bilateral   . Diverticulitis 11/2004  . Endometriosis 1994  . GERD (gastroesophageal reflux disease)    hx hiatal hernia, hx esophageal stricture s/p dilation  . Headache   . Hepatitis   . History of chicken pox   . History of hiatal hernia   . History of measles   . History of mumps as a child   . Hx of hepatitis    with cirrhosis - per her report from [place] and eval in 2013 at [facility]  . Hyperlipidemia   . Left ear pain   . Migraines    pt states vesticular migraines has dizziness in relation   . Numbness and tingling    feet bilat has had for 40 years  . Right knee pain   . Scarlet fever   . Shingles   . Urinary tract bacterial infections    hx of  . Vertigo    associated with headache, intermittent, chronic    Past Surgical History:  Procedure Laterality Date  . ABDOMINAL HYSTERECTOMY  1994   TAH/BSO due to endometriosis  . APPENDECTOMY    . CATARACT EXTRACTION  W/ INTRAOCULAR LENS IMPLANT Bilateral 05/2015, 04/2015  . DIAGNOSTIC LAPAROSCOPY    . LAPAROSCOPIC PARTIAL COLECTOMY N/A 04/10/2015   Procedure: LAPAROSCOPIC PARTIAL CECTOMY;  Surgeon: Ralene Ok, MD;  Location: [facility];  Service: General;  Laterality: N/A;  . LUNG BIOPSY  1996   Negative  . NEPHRECTOMY  04/16/09   Partial removal due to cancer  . OOPHORECTOMY    . TONSILLECTOMY    . TONSILLECTOMY      Current Outpatient Medications  Medication Sig Dispense Refill  . aspirin (EC-81 ASPIRIN) 81 MG EC tablet Take 1 tablet (81 mg total) by mouth daily. Swallow whole. 30 tablet 12  . atorvastatin (LIPITOR) 40 MG tablet Take 1 tablet (40 mg total) by mouth daily. 90 tablet 3  . cephALEXin (KEFLEX) 500 MG capsule Take 1 capsule (500 mg total) by mouth 4 (four) times daily. 20 capsule 0  . cycloSPORINE (RESTASIS) 0.05 % ophthalmic emulsion Apply to eye 2 (two) times daily.    . miconazole (  MONISTAT 7) 2 % vaginal cream Place 1 Applicatorful vaginally at bedtime. (Patient not taking: Reported on 02/07/2020) 45 g 0  . Multiple Vitamin (MULTIVITAMIN) tablet Take 1 tablet by mouth daily.      Marland Kitchen omeprazole (PRILOSEC) 20 MG capsule Take 1 capsule by mouth as needed.     No current facility-administered medications for this visit.     ALLERGIES: Codeine, Cottonelle fresh care [attends briefs small], Dexilant [dexlansoprazole], Nitrofurantoin, Other, Ranitidine, Septra [sulfamethoxazole-trimethoprim], and Sulfamethoxazole-trimethoprim  Family History  Problem Relation Age of Onset  . Cancer Father   . Thyroid cancer Mother   . Cancer Mother        thyroid/ renal cell ca   . Colon cancer Neg Hx   . Breast cancer Neg Hx     Social History   Socioeconomic History  . Marital status: Married    Spouse name: Not on file  . Number of children: 0  . Years of education: Not on file  . Highest education level: Not on file  Occupational History  . Occupation: Retired    Fish farm manager: RETIRED   Tobacco Use  . Smoking status: Former Smoker    Packs/day: 1.50    Years: 20.00    Pack years: 30.00    Types: Cigarettes    Quit date: 08/25/1990    Years since quitting: 29.7  . Smokeless tobacco: Never Used  Vaping Use  . Vaping Use: Never used  Substance and Sexual Activity  . Alcohol use: No    Comment: Rare  . Drug use: No  . Sexual activity: Not Currently    Partners: Male    Birth control/protection: Surgical, Post-menopausal    Comment: TAH  Other Topics Concern  . Not on file  Social History Narrative  . Not on file   Social Determinants of Health   Financial Resource Strain:   . Difficulty of Paying Living Expenses: Not on file  Food Insecurity:   . Worried About Charity fundraiser in the Last Year: Not on file  . Ran Out of Food in the Last Year: Not on file  Transportation Needs:   . Lack of Transportation (Medical): Not on file  . Lack of Transportation (Non-Medical): Not on file  Physical Activity:   . Days of Exercise per Week: Not on file  . Minutes of Exercise per Session: Not on file  Stress:   . Feeling of Stress : Not on file  Social Connections:   . Frequency of Communication with Friends and Family: Not on file  . Frequency of Social Gatherings with Friends and Family: Not on file  . Attends Religious Services: Not on file  . Active Member of Clubs or Organizations: Not on file  . Attends Archivist Meetings: Not on file  . Marital Status: Not on file  Intimate Partner Violence:   . Fear of Current or Ex-Partner: Not on file  . Emotionally Abused: Not on file  . Physically Abused: Not on file  . Sexually Abused: Not on file    Review of Systems  PHYSICAL EXAMINATION:    LMP 08/25/1992     General appearance: alert, cooperative and appears stated age Head: Normocephalic, without obvious abnormality, atraumatic Neck: no adenopathy, supple, symmetrical, trachea midline and thyroid normal to inspection and palpation Lungs:  clear to auscultation bilaterally Breasts: normal appearance, no masses or tenderness, No nipple retraction or dimpling, No nipple discharge or bleeding, No axillary or supraclavicular adenopathy Heart: regular rate and  rhythm Abdomen: soft, non-tender, no masses,  no organomegaly Extremities: extremities normal, atraumatic, no cyanosis or edema Skin: Skin color, texture, turgor normal. No rashes or lesions Lymph nodes: Cervical, supraclavicular, and axillary nodes normal. No abnormal inguinal nodes palpated Neurologic: Grossly normal  Pelvic: External genitalia:  no lesions              Urethra:  normal appearing urethra with no masses, tenderness or lesions              Bartholins and Skenes: normal                 Vagina: normal appearing vagina with normal color and discharge, no lesions              Cervix: no lesions                Bimanual Exam:  Uterus:  normal size, contour, position, consistency, mobility, non-tender              Adnexa: no mass, fullness, tenderness              Rectal exam: {yes no:314532}.  Confirms.              Anus:  normal sphincter tone, no lesions  Chaperone was present for exam.  ASSESSMENT     PLAN     An After Visit Summary was printed and given to the patient.  ______ minutes face to face time of which over 50% was spent in counseling.

## 2020-05-07 NOTE — Telephone Encounter (Signed)
No DNKA fee.

## 2020-05-07 NOTE — Telephone Encounter (Signed)
Patient did not keep schedule appointment. Should the [facility] policy apply? Patient seemed very disoriented when coming for appointment last week that had to be rescheduled for today.

## 2020-05-14 ENCOUNTER — Telehealth: Payer: Self-pay | Admitting: Obstetrics and Gynecology

## 2020-05-14 ENCOUNTER — Ambulatory Visit (INDEPENDENT_AMBULATORY_CARE_PROVIDER_SITE_OTHER): Payer: Medicare Other | Admitting: Obstetrics and Gynecology

## 2020-05-14 ENCOUNTER — Other Ambulatory Visit: Payer: Self-pay

## 2020-05-14 ENCOUNTER — Encounter: Payer: Self-pay | Admitting: Obstetrics and Gynecology

## 2020-05-14 VITALS — BP 142/82 | HR 84 | Ht 62.75 in | Wt 147.4 lb

## 2020-05-14 DIAGNOSIS — R7309 Other abnormal glucose: Secondary | ICD-10-CM

## 2020-05-14 DIAGNOSIS — N760 Acute vaginitis: Secondary | ICD-10-CM

## 2020-05-14 DIAGNOSIS — R61 Generalized hyperhidrosis: Secondary | ICD-10-CM

## 2020-05-14 NOTE — Progress Notes (Signed)
GYNECOLOGY  VISIT   HPI: 74 y.o.   Married  Caucasian  female   G0P0000 with Patient's last menstrual period was 08/25/1992.   here for internal vaginal irritation. Patient also complaining of hot flashes for the past 6 months.  She notices dryness on the left side of her vagina.   She is having hot flashes every day, but not at night.  She has hot flashes 3 - 4 per day.   She has dizziness and has seen providers at [facility] for this.   Not taking any medication except Tums and Restasis.   She is having urinary incontinence again.  Comes without warning.  Drinks decaffeinated beverages.  Drinks a large decaf diet soda.   Elevated glucose of 110 on 11/21/19 on chart review.  Had her Covid vaccine.   GYNECOLOGIC HISTORY: Patient's last menstrual period was 08/25/1992. Contraception: PMP/Hyst/BSO Menopausal hormone therapy: none Last mammogram: 09-27-19  3D/Neg/density C/BiRads1 Last pap smear: 2009 Neg        OB History    Gravida  0   Para  0   Term  0   Preterm  0   AB  0   Living  0     SAB  0   TAB  0   Ectopic  0   Multiple  0   Live Births  0              Patient Active Problem List   Diagnosis Date Noted  . Hyperlipidemia 11/22/2019  . S/P partial resection of colon 04/10/2015  . Cirrhosis ([place]) 12/08/2014  . Esophageal reflux 12/08/2014  . Esophageal stricture 12/08/2014  . Vertigo 12/08/2014    Past Medical History:  Diagnosis Date  . Arthritis   . Bronchitis    hx of when smoked   . Cancer ([place]) 04/16/09   kidney cancer - [facility] per her report released from f/u  . Cataracts, bilateral   . Diverticulitis 11/2004  . Endometriosis 1994  . GERD (gastroesophageal reflux disease)    hx hiatal hernia, hx esophageal stricture s/p dilation  . Headache   . Hepatitis   . History of chicken pox   . History of hiatal hernia   . History of measles   . History of mumps as a child   . Hx of hepatitis    with cirrhosis - per her  report from [place] and eval in 2013 at [place]  . Hyperlipidemia   . Left ear pain   . Migraines    pt states vesticular migraines has dizziness in relation   . Numbness and tingling    feet bilat has had for 40 years  . Right knee pain   . Scarlet fever   . Shingles   . Urinary tract bacterial infections    hx of  . Vertigo    associated with headache, intermittent, chronic    Past Surgical History:  Procedure Laterality Date  . ABDOMINAL HYSTERECTOMY  1994   TAH/BSO due to endometriosis  . APPENDECTOMY    . CATARACT EXTRACTION W/ INTRAOCULAR LENS IMPLANT Bilateral 05/2015, 04/2015  . DIAGNOSTIC LAPAROSCOPY    . LAPAROSCOPIC PARTIAL COLECTOMY N/A 04/10/2015   Procedure: LAPAROSCOPIC PARTIAL CECTOMY;  Surgeon: Ralene Ok, MD;  Location: [facility];  Service: General;  Laterality: N/A;  . LUNG BIOPSY  1996   Negative  . NEPHRECTOMY  04/16/09   Partial removal due to cancer  . OOPHORECTOMY    . TONSILLECTOMY    .  TONSILLECTOMY      Current Outpatient Medications  Medication Sig Dispense Refill  . cycloSPORINE (RESTASIS) 0.05 % ophthalmic emulsion Apply to eye 2 (two) times daily.     No current facility-administered medications for this visit.     ALLERGIES: Codeine, Cottonelle fresh care [attends briefs small], Dexilant [dexlansoprazole], Nitrofurantoin, Other, Ranitidine, Septra [sulfamethoxazole-trimethoprim], and Sulfamethoxazole-trimethoprim  Family History  Problem Relation Age of Onset  . Cancer Father   . Thyroid cancer Mother   . Cancer Mother        thyroid/ renal cell ca   . Colon cancer Neg Hx   . Breast cancer Neg Hx     Social History   Socioeconomic History  . Marital status: Married    Spouse name: Not on file  . Number of children: 0  . Years of education: Not on file  . Highest education level: Not on file  Occupational History  . Occupation: Retired    Fish farm manager: RETIRED  Tobacco Use  . Smoking status: Former Smoker    Packs/day: 1.50     Years: 20.00    Pack years: 30.00    Types: Cigarettes    Quit date: 08/25/1990    Years since quitting: 29.7  . Smokeless tobacco: Never Used  Vaping Use  . Vaping Use: Never used  Substance and Sexual Activity  . Alcohol use: No    Comment: Rare  . Drug use: No  . Sexual activity: Not Currently    Partners: Male    Birth control/protection: Surgical, Post-menopausal    Comment: TAH  Other Topics Concern  . Not on file  Social History Narrative  . Not on file   Social Determinants of Health   Financial Resource Strain:   . Difficulty of Paying Living Expenses: Not on file  Food Insecurity:   . Worried About Charity fundraiser in the Last Year: Not on file  . Ran Out of Food in the Last Year: Not on file  Transportation Needs:   . Lack of Transportation (Medical): Not on file  . Lack of Transportation (Non-Medical): Not on file  Physical Activity:   . Days of Exercise per Week: Not on file  . Minutes of Exercise per Session: Not on file  Stress:   . Feeling of Stress : Not on file  Social Connections:   . Frequency of Communication with Friends and Family: Not on file  . Frequency of Social Gatherings with Friends and Family: Not on file  . Attends Religious Services: Not on file  . Active Member of Clubs or Organizations: Not on file  . Attends Archivist Meetings: Not on file  . Marital Status: Not on file  Intimate Partner Violence:   . Fear of Current or Ex-Partner: Not on file  . Emotionally Abused: Not on file  . Physically Abused: Not on file  . Sexually Abused: Not on file    Review of Systems  Neurological: Positive for dizziness (she has not seen PCP regarding this--she states this has gone on for years).  All other systems reviewed and are negative.   PHYSICAL EXAMINATION:    BP (!) 142/82   Pulse 84   Ht 5' 2.75" (1.594 m)   Wt 147 lb 6.4 oz (66.9 kg)   LMP 08/25/1992   BMI 26.32 kg/m     General appearance: alert, cooperative  and appears stated age  Pelvic: External genitalia:  Erythema of the bilateral labia.  Urethra:  normal appearing urethra with no masses, tenderness or lesions              Bartholins and Skenes: normal                 Vagina: normal appearing vagina with normal color and discharge, no lesions              Cervix: absent                Bimanual Exam:  Uterus:  absent              Adnexa: no mass, fullness, tenderness                Chaperone was present for exam.  ASSESSMENT  Status post TAH/BSO for endometriosis. Off Vagifem.  Diaphoresis.  Elevated glucose on chart review. Vulvovaginitis.   PLAN  Affirm testing for vaginitis. Check A1C, CMP, and TSH. Will wait to consider restarting Vagifem.  Final recommendations to follow.

## 2020-05-14 NOTE — Telephone Encounter (Signed)
Spoke with patient. Patient request to schedule OV for vaginal irritation. Denies any other symptoms. Has been using coconut oil nightly with no changes. OV scheduled for today at 4pm with [name].   Patient read back appt details and is agreeable.   Encounter closed.

## 2020-05-14 NOTE — Telephone Encounter (Signed)
Patient is having vaginal irritation.

## 2020-05-15 LAB — COMPREHENSIVE METABOLIC PANEL
ALT: 11 IU/L (ref 0–32)
AST: 27 IU/L (ref 0–40)
Albumin/Globulin Ratio: 1.5 (ref 1.2–2.2)
Albumin: 4.3 g/dL (ref 3.7–4.7)
Alkaline Phosphatase: 85 IU/L (ref 44–121)
BUN/Creatinine Ratio: 11 — ABNORMAL LOW (ref 12–28)
BUN: 10 mg/dL (ref 8–27)
Bilirubin Total: 0.3 mg/dL (ref 0.0–1.2)
CO2: 26 mmol/L (ref 20–29)
Calcium: 9.3 mg/dL (ref 8.7–10.3)
Chloride: 99 mmol/L (ref 96–106)
Creatinine, Ser: 0.89 mg/dL (ref 0.57–1.00)
GFR calc Af Amer: 74 mL/min/{1.73_m2} (ref >59)
GFR calc non Af Amer: 64 mL/min/{1.73_m2} (ref >59)
Globulin, Total: 2.9 g/dL (ref 1.5–4.5)
Glucose: 87 mg/dL (ref 65–99)
Potassium: 5.2 mmol/L (ref 3.5–5.2)
Sodium: 138 mmol/L (ref 134–144)
Total Protein: 7.2 g/dL (ref 6.0–8.5)

## 2020-05-15 LAB — VAGINITIS/VAGINOSIS, DNA PROBE
Candida Species: NEGATIVE
Gardnerella vaginalis: NEGATIVE
Trichomonas vaginosis: NEGATIVE

## 2020-05-15 LAB — HEMOGLOBIN A1C
Est. average glucose Bld gHb Est-mCnc: 111 mg/dL
Hgb A1c MFr Bld: 5.5 % (ref 4.8–5.6)

## 2020-05-15 LAB — TSH: TSH: 1.07 u[IU]/mL (ref 0.450–4.500)

## 2020-05-16 ENCOUNTER — Other Ambulatory Visit: Payer: Self-pay | Admitting: *Deleted

## 2020-05-16 MED ORDER — ESTRADIOL 10 MCG VA TABS: ORAL_TABLET | VAGINAL | 4 refills | Status: DC

## 2020-05-17 ENCOUNTER — Telehealth: Payer: Self-pay

## 2020-05-17 NOTE — Telephone Encounter (Addendum)
Spoke with patient. Patient states her right arm is sore, she did not recall getting any injections while in the office, calling to confirm. States she is scheduled to get her flu vaccine on 9/24. Denies any other symptoms or recent injuries/falls. A/O x4. Advised no vaccines given during her OV on 05/14/20. Patient verbalizes understanding is agreeable.   Routing to provider for final review. Patient is agreeable to disposition. Will close encounter.

## 2020-05-17 NOTE — Telephone Encounter (Signed)
Patient would like to know what kind of shots she received at last visit.

## 2020-05-17 NOTE — Telephone Encounter (Addendum)
[facility]ax received from [facility]. "Patient request new Rx: pt is asking if she was given a flu shot at last appt. Presented to pharmacy wanting to get one".  Brenda [name], CMA contacted [facility] and advised no flu shot received in office. Was advised patient did [facility] today.    This RN spoke with patient on 05/16/20 to review results, advised patient to f/u with PCP.   Per review of Epic, Patient is scheduled for OV with PCP on 05/28/20.   Call placed to [facility] at [facility] [phone], office phones are off, will attempt to contact again on 9/24 to ensure patient is seen for evaluation.

## 2020-05-18 ENCOUNTER — Telehealth: Payer: Self-pay | Admitting: Internal Medicine

## 2020-05-18 NOTE — Telephone Encounter (Signed)
Please call the nurse Sharee Pimple [phone] at [facility] concerning the pts increased confusion

## 2020-05-18 NOTE — Telephone Encounter (Signed)
Call placed to PCP, LM for CMA to return call to [place], RN at [facility] at [phone].

## 2020-05-18 NOTE — Telephone Encounter (Signed)
Called OBGYN back to confirm that  I had received their message. Upcoming appointment is scheduled to be in person. Will complete MMSE at this visit.

## 2020-05-18 NOTE — Telephone Encounter (Signed)
Message received from Dr. Alcario Drought office. Patient has upcoming appt.  Per review of Epic, will complete MMSE.  Routing to Dr. Antony Blackbird.

## 2020-05-28 ENCOUNTER — Ambulatory Visit: Payer: Medicare Other | Admitting: Internal Medicine

## 2020-06-07 ENCOUNTER — Other Ambulatory Visit: Payer: Self-pay | Admitting: Internal Medicine

## 2020-06-07 DIAGNOSIS — K222 Esophageal obstruction: Secondary | ICD-10-CM

## 2020-06-07 DIAGNOSIS — R1310 Dysphagia, unspecified: Secondary | ICD-10-CM

## 2020-06-07 NOTE — Telephone Encounter (Signed)
I put in the referral to GI.   Phill Myron, D.O. Primary Care at [facility]  06/07/2020, 3:58 PM

## 2020-06-12 ENCOUNTER — Other Ambulatory Visit: Payer: Self-pay | Admitting: Gastroenterology

## 2020-06-12 DIAGNOSIS — R1310 Dysphagia, unspecified: Secondary | ICD-10-CM | POA: Diagnosis not present

## 2020-06-12 DIAGNOSIS — K7460 Unspecified cirrhosis of liver: Secondary | ICD-10-CM | POA: Diagnosis not present

## 2020-06-12 DIAGNOSIS — K219 Gastro-esophageal reflux disease without esophagitis: Secondary | ICD-10-CM | POA: Diagnosis not present

## 2020-06-20 ENCOUNTER — Ambulatory Visit
Admission: RE | Admit: 2020-06-20 | Discharge: 2020-06-20 | Disposition: A | Discharge status: Home / Self Care | Payer: Medicare Other | Source: Ambulatory Visit | Attending: Gastroenterology | Admitting: Gastroenterology

## 2020-06-20 DIAGNOSIS — R1310 Dysphagia, unspecified: Secondary | ICD-10-CM

## 2020-06-20 DIAGNOSIS — K7460 Unspecified cirrhosis of liver: Secondary | ICD-10-CM

## 2020-06-20 DIAGNOSIS — K224 Dyskinesia of esophagus: Secondary | ICD-10-CM | POA: Diagnosis not present

## 2020-06-20 DIAGNOSIS — K7689 Other specified diseases of liver: Secondary | ICD-10-CM | POA: Diagnosis not present

## 2020-07-04 ENCOUNTER — Other Ambulatory Visit: Payer: Self-pay | Admitting: Gastroenterology

## 2020-07-04 DIAGNOSIS — K7469 Other cirrhosis of liver: Secondary | ICD-10-CM

## 2020-07-11 IMAGING — MG DIGITAL SCREENING BILAT W/ TOMO W/ CAD
8 series · 8 of 24 positions shown · non-contrast
Comparison: Previous exam(s).

CLINICAL DATA: Screening.

EXAM:
DIGITAL SCREENING BILATERAL MAMMOGRAM WITH TOMO AND CAD

[L CC synth-2D]
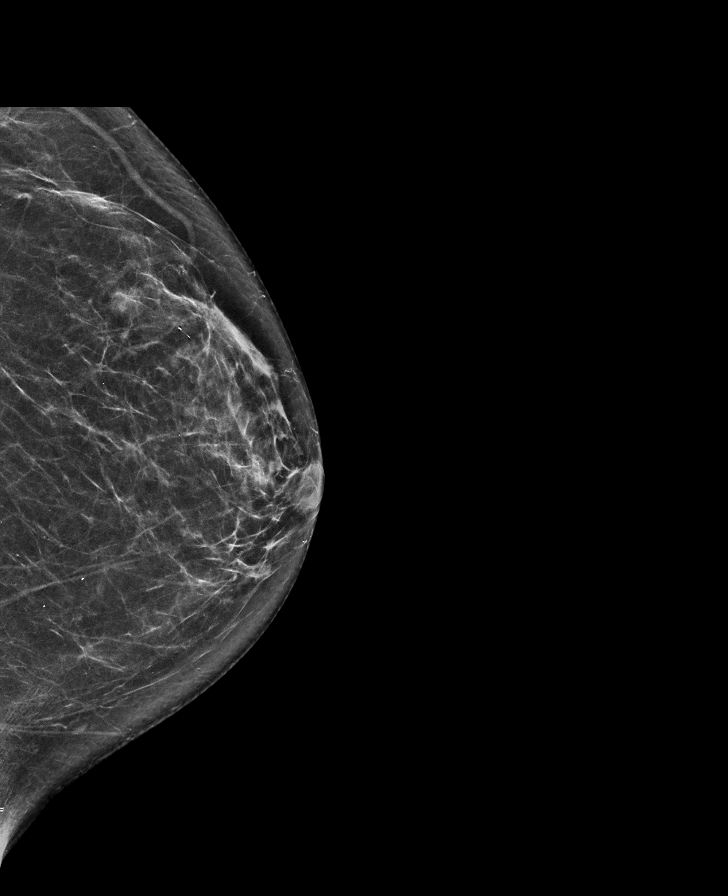

[R CC synth-2D]
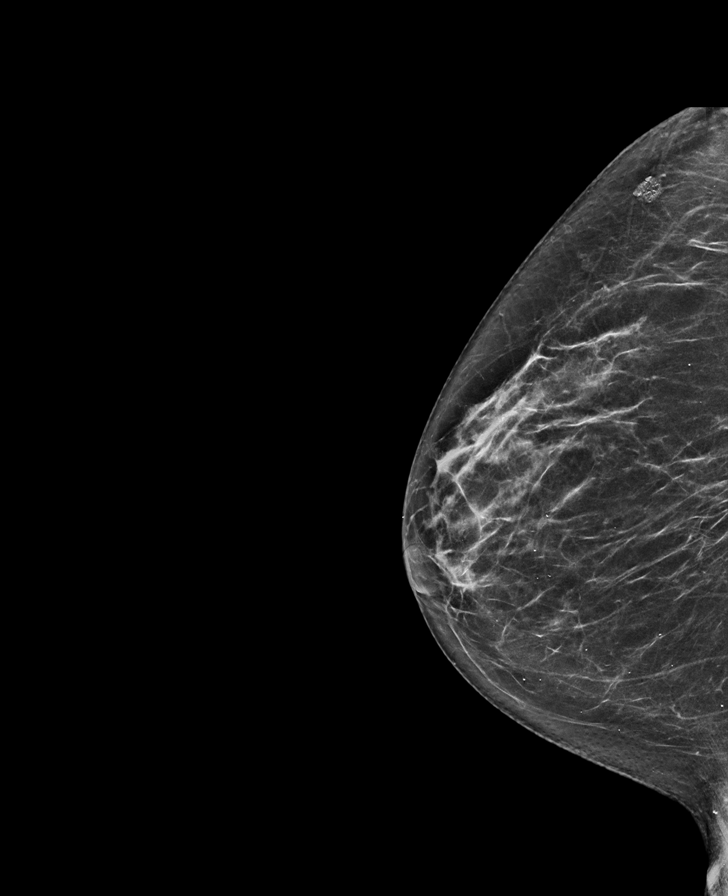

[L MLO synth-2D]
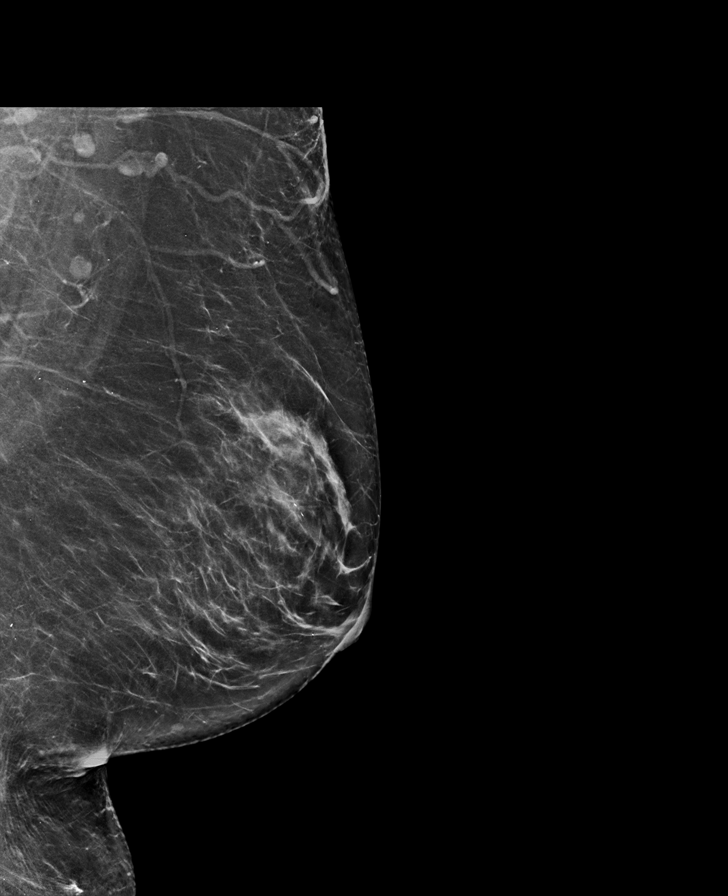

[R MLO synth-2D]
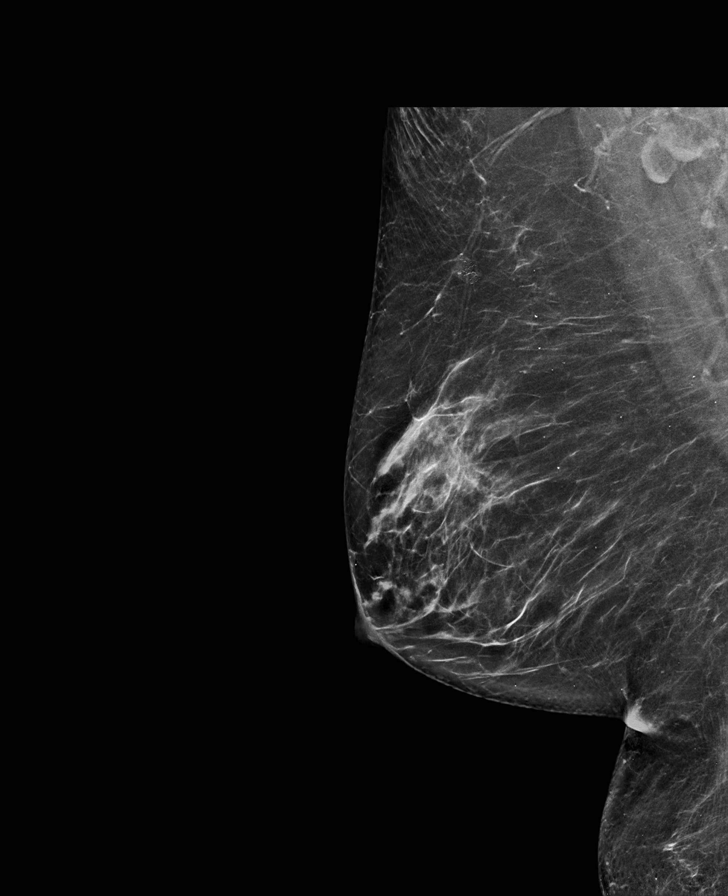

[L CC tomo · tomo slice 35/70.0]
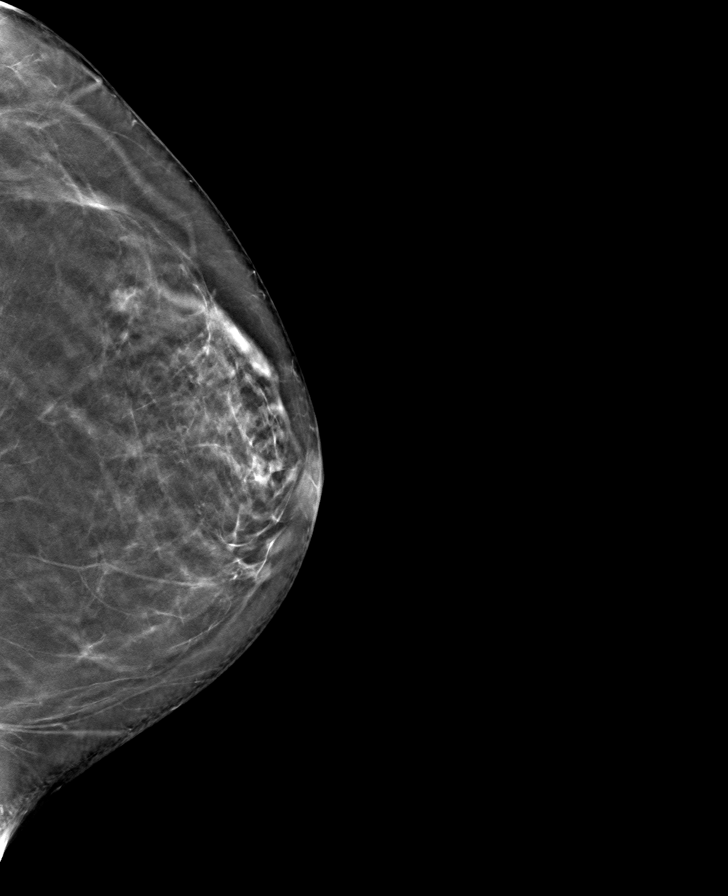

[R MLO tomo · tomo slice 37/74.0]
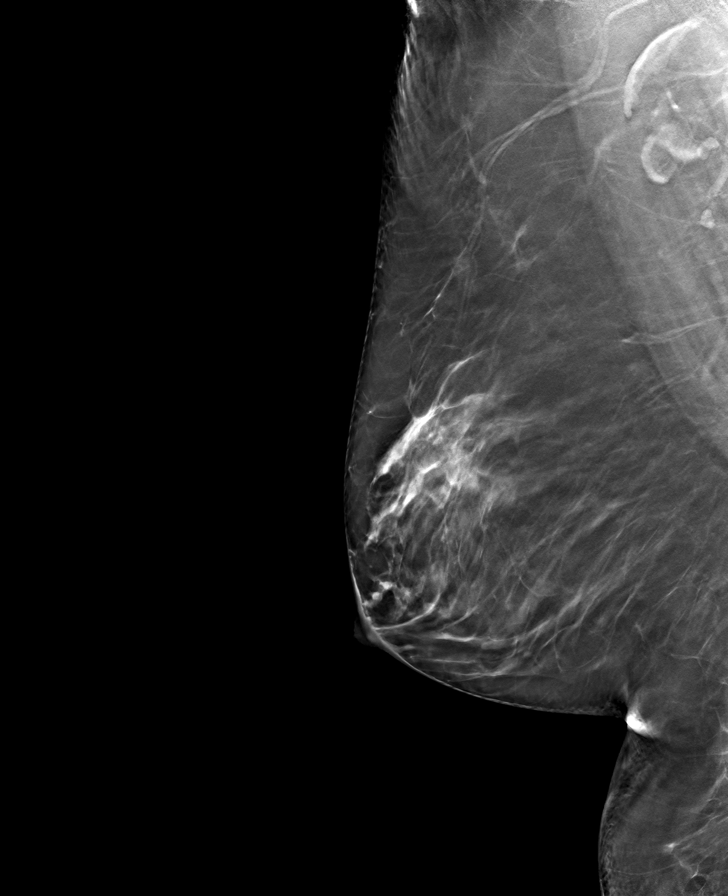

[L MLO tomo · tomo slice 39/76.0]
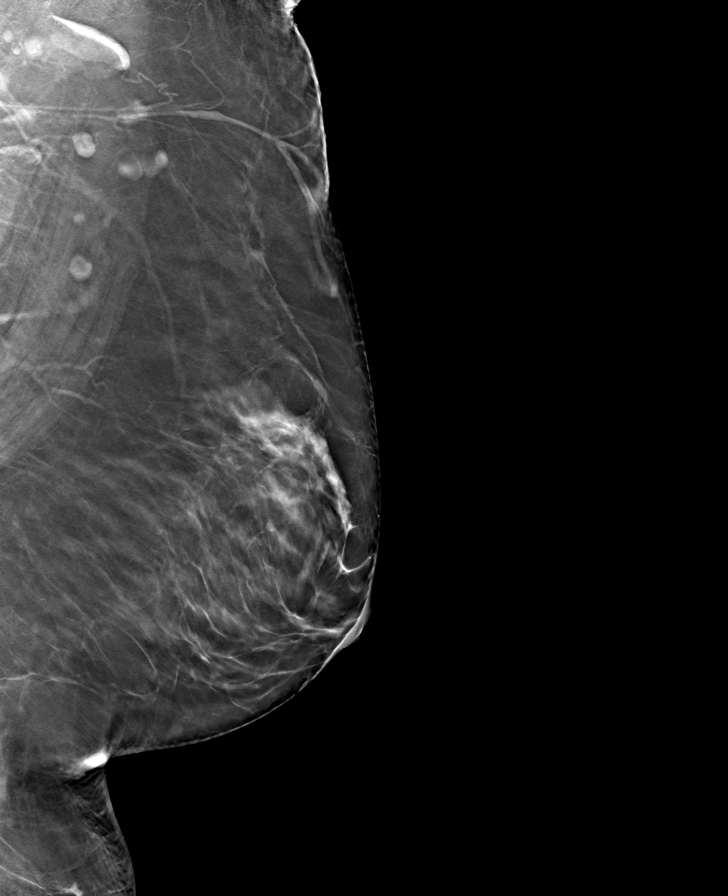

[R CC tomo · tomo slice 35/68.0]
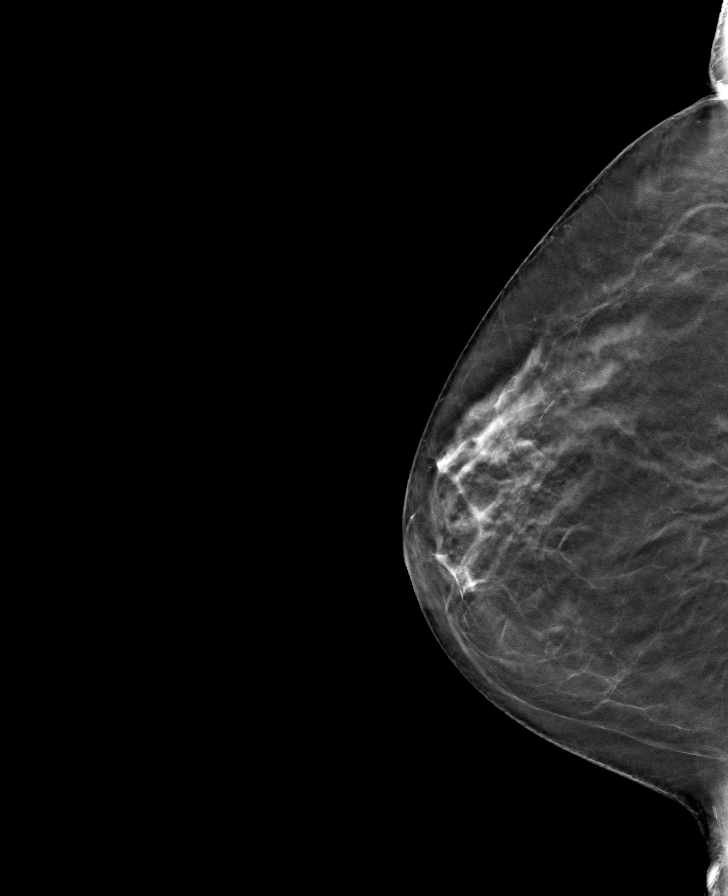

[8 of 24 positions shown; findings below may reference images not displayed]

ACR Breast Density Category c: The breast tissue is heterogeneously
dense, which may obscure small masses.
FINDINGS: There are no findings suspicious for malignancy. Images were
processed with CAD.
IMPRESSION: No mammographic evidence of malignancy. A result letter of this
screening mammogram will be mailed directly to the patient.

RECOMMENDATION:
Screening mammogram in one year. (Code:[id number]-[id number])

BI-RADS CATEGORY  1: Negative.

## 2020-07-13 DIAGNOSIS — Z1159 Encounter for screening for other viral diseases: Secondary | ICD-10-CM | POA: Diagnosis not present

## 2020-07-18 DIAGNOSIS — K222 Esophageal obstruction: Secondary | ICD-10-CM | POA: Diagnosis not present

## 2020-07-18 DIAGNOSIS — K269 Duodenal ulcer, unspecified as acute or chronic, without hemorrhage or perforation: Secondary | ICD-10-CM | POA: Diagnosis not present

## 2020-07-18 DIAGNOSIS — R1310 Dysphagia, unspecified: Secondary | ICD-10-CM | POA: Diagnosis not present

## 2020-07-18 DIAGNOSIS — I8500 Esophageal varices without bleeding: Secondary | ICD-10-CM | POA: Diagnosis not present

## 2020-07-18 DIAGNOSIS — R933 Abnormal findings on diagnostic imaging of other parts of digestive tract: Secondary | ICD-10-CM | POA: Diagnosis not present

## 2020-07-18 DIAGNOSIS — K449 Diaphragmatic hernia without obstruction or gangrene: Secondary | ICD-10-CM | POA: Diagnosis not present

## 2020-07-18 DIAGNOSIS — R1010 Upper abdominal pain, unspecified: Secondary | ICD-10-CM | POA: Diagnosis not present

## 2020-07-27 ENCOUNTER — Other Ambulatory Visit: Payer: Self-pay | Admitting: Gastroenterology

## 2020-07-27 DIAGNOSIS — K222 Esophageal obstruction: Secondary | ICD-10-CM

## 2020-07-27 DIAGNOSIS — R634 Abnormal weight loss: Secondary | ICD-10-CM

## 2020-08-16 ENCOUNTER — Ambulatory Visit
Admission: RE | Admit: 2020-08-16 | Discharge: 2020-08-16 | Disposition: A | Discharge status: Home / Self Care | Payer: Medicare Other | Source: Ambulatory Visit | Attending: Gastroenterology | Admitting: Gastroenterology

## 2020-08-16 DIAGNOSIS — K449 Diaphragmatic hernia without obstruction or gangrene: Secondary | ICD-10-CM | POA: Diagnosis not present

## 2020-08-16 DIAGNOSIS — J432 Centrilobular emphysema: Secondary | ICD-10-CM | POA: Diagnosis not present

## 2020-08-16 DIAGNOSIS — I700 Atherosclerosis of aorta: Secondary | ICD-10-CM | POA: Diagnosis not present

## 2020-08-16 DIAGNOSIS — R634 Abnormal weight loss: Secondary | ICD-10-CM

## 2020-08-16 DIAGNOSIS — K222 Esophageal obstruction: Secondary | ICD-10-CM

## 2020-08-16 MED ORDER — IOPAMIDOL (ISOVUE-300) INJECTION 61%
75.0000 mL | Freq: Once | INTRAVENOUS | Status: AC | PRN
  Administered 2020-08-16: 75 mL via INTRAVENOUS

## 2020-08-26 ENCOUNTER — Telehealth: Payer: Medicare Other | Admitting: Family

## 2020-08-26 DIAGNOSIS — R399 Unspecified symptoms and signs involving the genitourinary system: Secondary | ICD-10-CM | POA: Diagnosis not present

## 2020-08-26 MED ORDER — CEPHALEXIN 500 MG PO CAPS: 500.0000 mg | ORAL_CAPSULE | Freq: Two times a day (BID) | ORAL | 0 refills | Status: DC

## 2020-08-26 NOTE — Progress Notes (Signed)

## 2020-09-06 DIAGNOSIS — K222 Esophageal obstruction: Secondary | ICD-10-CM | POA: Diagnosis not present

## 2020-10-31 DIAGNOSIS — K219 Gastro-esophageal reflux disease without esophagitis: Secondary | ICD-10-CM | POA: Diagnosis not present

## 2020-10-31 DIAGNOSIS — K449 Diaphragmatic hernia without obstruction or gangrene: Secondary | ICD-10-CM | POA: Diagnosis not present

## 2020-10-31 DIAGNOSIS — K2930 Chronic superficial gastritis without bleeding: Secondary | ICD-10-CM | POA: Diagnosis not present

## 2020-10-31 DIAGNOSIS — K222 Esophageal obstruction: Secondary | ICD-10-CM | POA: Diagnosis not present

## 2020-11-26 DIAGNOSIS — M79603 Pain in arm, unspecified: Secondary | ICD-10-CM | POA: Diagnosis not present

## 2020-11-26 DIAGNOSIS — R531 Weakness: Secondary | ICD-10-CM | POA: Diagnosis not present

## 2020-11-26 DIAGNOSIS — I10 Essential (primary) hypertension: Secondary | ICD-10-CM | POA: Diagnosis not present

## 2020-11-26 DIAGNOSIS — R29810 Facial weakness: Secondary | ICD-10-CM | POA: Diagnosis not present

## 2021-01-25 ENCOUNTER — Other Ambulatory Visit: Payer: Self-pay | Admitting: Gastroenterology

## 2021-01-25 DIAGNOSIS — K7460 Unspecified cirrhosis of liver: Secondary | ICD-10-CM

## 2021-01-29 ENCOUNTER — Ambulatory Visit
Admission: RE | Admit: 2021-01-29 | Discharge: 2021-01-29 | Disposition: A | Discharge status: Home / Self Care | Payer: Medicare Other | Source: Ambulatory Visit | Attending: Gastroenterology | Admitting: Gastroenterology

## 2021-01-29 DIAGNOSIS — K7460 Unspecified cirrhosis of liver: Secondary | ICD-10-CM

## 2021-01-31 ENCOUNTER — Telehealth: Payer: Self-pay | Admitting: Internal Medicine

## 2021-01-31 NOTE — Telephone Encounter (Signed)
Pt called in asking if she could get a prescription called in for a UTI.   Pharmacy  [id number] - [place], [address] [place] [address]  Phone:  [phone]  Fax:  [phone]  DEA #:  [id number]

## 2021-02-04 ENCOUNTER — Telehealth: Payer: Self-pay | Admitting: *Deleted

## 2021-02-04 NOTE — Telephone Encounter (Signed)
Patient states she has already taken care of the sx's of UTI.

## 2021-05-22 DIAGNOSIS — Z23 Encounter for immunization: Secondary | ICD-10-CM | POA: Diagnosis not present

## 2021-06-11 ENCOUNTER — Other Ambulatory Visit: Payer: Self-pay

## 2021-06-11 ENCOUNTER — Encounter: Payer: Self-pay | Admitting: Family Medicine

## 2021-06-11 ENCOUNTER — Ambulatory Visit (INDEPENDENT_AMBULATORY_CARE_PROVIDER_SITE_OTHER): Payer: Medicare Other | Admitting: Family Medicine

## 2021-06-11 VITALS — BP 121/85 | HR 75 | Temp 97.4°F | Resp 16 | Ht 64.0 in | Wt 137.0 lb

## 2021-06-11 DIAGNOSIS — G8929 Other chronic pain: Secondary | ICD-10-CM | POA: Diagnosis not present

## 2021-06-11 DIAGNOSIS — Z1231 Encounter for screening mammogram for malignant neoplasm of breast: Secondary | ICD-10-CM

## 2021-06-11 DIAGNOSIS — R42 Dizziness and giddiness: Secondary | ICD-10-CM

## 2021-06-11 DIAGNOSIS — M25561 Pain in right knee: Secondary | ICD-10-CM

## 2021-06-11 NOTE — Progress Notes (Signed)
Patient has concerns about STM loss Patient needs a mammogram

## 2021-06-12 ENCOUNTER — Encounter: Payer: Self-pay | Admitting: Family Medicine

## 2021-06-12 NOTE — Progress Notes (Signed)
Established  Patient Office Visit  Subjective:  Patient ID: Brenda [name], female    DOB: 28-Aug-1945  Age: 75 y.o. MRN: [id number]  CC:  Chief Complaint  Patient presents with   Follow-up    HPI Brenda [name] presents for follow up/review of chronic med issues. Patient reports that she continues to have chronic right knee pain. However she wants to continue her present management which is OTC topical voltaren gel which manages her sx. She also has chronic intermittent dizziness which she reports has been worked up by neuro and is now also managed.   Past Medical History:  Diagnosis Date   Arthritis    Bronchitis    hx of when smoked    Cancer ([place]) 04/16/09   kidney cancer - tx by [facility] per her report released from f/u   Cataracts, bilateral    Diverticulitis 11/2004   Endometriosis 1994   GERD (gastroesophageal reflux disease)    hx hiatal hernia, hx esophageal stricture s/p dilation   Headache    Hepatitis    History of chicken pox    History of hiatal hernia    History of measles    History of mumps as a child    Hx of hepatitis    with cirrhosis - per her report from [facility] and eval in 2013 at [facility]   Hyperlipidemia    Left ear pain    Migraines    pt states vesticular migraines has dizziness in relation    Numbness and tingling    feet bilat has had for 40 years   Right knee pain    Scarlet fever    Shingles    Urinary tract bacterial infections    hx of   Vertigo    associated with headache, intermittent, chronic    Past Surgical History:  Procedure Laterality Date   ABDOMINAL HYSTERECTOMY  1994   TAH/BSO due to endometriosis   APPENDECTOMY     CATARACT EXTRACTION W/ INTRAOCULAR LENS IMPLANT Bilateral 05/2015, 04/2015   DIAGNOSTIC LAPAROSCOPY     LAPAROSCOPIC PARTIAL COLECTOMY N/A 04/10/2015   Procedure: LAPAROSCOPIC PARTIAL CECTOMY;  Surgeon: Ralene Ok, MD;  Location: [facility];  Service: General;  Laterality: N/A;   LUNG BIOPSY   1996   Negative   NEPHRECTOMY  04/16/09   Partial removal due to cancer   OOPHORECTOMY     TONSILLECTOMY     TONSILLECTOMY      Family History  Problem Relation Age of Onset   Cancer Father    Thyroid cancer Mother    Cancer Mother        thyroid/ renal cell ca    Colon cancer Neg Hx    Breast cancer Neg Hx     Social History   Socioeconomic History   Marital status: Married    Spouse name: Not on file   Number of children: 0   Years of education: Not on file   Highest education level: Not on file  Occupational History   Occupation: Retired    Fish farm manager: RETIRED  Tobacco Use   Smoking status: Former    Packs/day: 1.50    Years: 20.00    Pack years: 30.00    Types: Cigarettes    Quit date: 08/25/1990    Years since quitting: 30.8   Smokeless tobacco: Never  Vaping Use   Vaping Use: Never used  Substance and Sexual Activity   Alcohol use: No    Comment: Rare  Drug use: No   Sexual activity: Not Currently    Partners: Male    Birth control/protection: Surgical, Post-menopausal    Comment: TAH  Other Topics Concern   Not on file  Social History Narrative   Not on file   Social Determinants of Health   Financial Resource Strain: Not on file  Food Insecurity: Not on file  Transportation Needs: Not on file  Physical Activity: Not on file  Stress: Not on file  Social Connections: Not on file  Intimate Partner Violence: Not on file    ROS Review of Systems  Musculoskeletal:  Negative for gait problem.  Neurological:  Positive for dizziness.  All other systems reviewed and are negative.  Objective:   Today's Vitals: BP 121/85   Pulse 75   Temp (!) 97.4 F (36.3 C) (Oral)   Resp 16   Ht 5\' 4"  (1.626 m)   Wt 137 lb (62.1 kg)   LMP 08/25/1992   SpO2 95%   BMI 23.52 kg/m   Physical Exam Vitals and nursing note reviewed.  Constitutional:      General: She is not in acute distress. HENT:     Head: Normocephalic and atraumatic.  Cardiovascular:      Rate and Rhythm: Normal rate and regular rhythm.  Pulmonary:     Effort: Pulmonary effort is normal.     Breath sounds: Normal breath sounds.  Musculoskeletal:     Right knee: Swelling present. No deformity. Tenderness present.  Neurological:     General: No focal deficit present.     Mental Status: She is alert and oriented to person, place, and time.    Assessment & Plan:    1. Chronic pain of right knee Continue present management  2. Dizziness Appears stable - continue present management  3. Encounter for screening mammogram for malignant neoplasm of breast  - MM Digital Screening; Future    Outpatient Encounter Medications as of 06/11/2021  Medication Sig   cephALEXin (KEFLEX) 500 MG capsule Take 1 capsule (500 mg total) by mouth 2 (two) times daily.   Estradiol 10 MCG TABS vaginal tablet Place one tablet (10 mcg) per vagina at hs nightly for 2 weeks and then place one tablet twice weekly.   [DISCONTINUED] cycloSPORINE (RESTASIS) 0.05 % ophthalmic emulsion Apply to eye 2 (two) times daily. (Patient not taking: Reported on 06/11/2021)   No facility-administered encounter medications on file as of 06/11/2021.    Follow-up: No follow-ups on file.   Becky Sax, MD

## 2021-06-27 ENCOUNTER — Ambulatory Visit (INDEPENDENT_AMBULATORY_CARE_PROVIDER_SITE_OTHER): Payer: Medicare Other

## 2021-06-27 ENCOUNTER — Other Ambulatory Visit: Payer: Self-pay

## 2021-06-27 ENCOUNTER — Encounter: Payer: Self-pay | Admitting: Nurse Practitioner

## 2021-06-27 ENCOUNTER — Ambulatory Visit (INDEPENDENT_AMBULATORY_CARE_PROVIDER_SITE_OTHER): Payer: Medicare Other | Admitting: Nurse Practitioner

## 2021-06-27 VITALS — BP 130/69 | HR 75 | Resp 18

## 2021-06-27 DIAGNOSIS — R109 Unspecified abdominal pain: Secondary | ICD-10-CM | POA: Insufficient documentation

## 2021-06-27 DIAGNOSIS — R0781 Pleurodynia: Secondary | ICD-10-CM | POA: Diagnosis not present

## 2021-06-27 LAB — POCT URINALYSIS DIP (CLINITEK)
Bilirubin, UA: NEGATIVE
Blood, UA: NEGATIVE
Glucose, UA: NEGATIVE mg/dL
Ketones, POC UA: NEGATIVE mg/dL
Nitrite, UA: NEGATIVE
POC PROTEIN,UA: NEGATIVE
Spec Grav, UA: 1.015 (ref 1.010–1.025)
Urobilinogen, UA: 0.2 E.U./dL
pH, UA: 7 (ref 5.0–8.0)

## 2021-06-27 MED ORDER — CEPHALEXIN 500 MG PO CAPS: 500.0000 mg | ORAL_CAPSULE | Freq: Three times a day (TID) | ORAL | 0 refills | Status: AC

## 2021-06-27 NOTE — Progress Notes (Signed)
@Patient  ID: Brenda [name], female    DOB: [birth year], 75 y.o.   MRN: [id number]  Chief Complaint  Patient presents with   Abdominal Pain    Referring provider: Dorna Mai, MD   HPI  Patient presents today with left abdominal pain.  She states that she started having left-sided abdominal pain yesterday.  She states that she has been having regular bowel movements.  Her last bowel movement was yesterday.  She denies any blood in stool.  She denies any recent fever.  She denies any recent vomiting.  Patient is having pinpoint tenderness over her left ribs.  She states that she has not fallen recently.  Denies any recent injury. Denies f/c/s, n/v/d, hemoptysis, PND, Chest pain or edema.       Allergies  Allergen Reactions   Codeine     REACTION: nausea---CNS side effects   Cottonelle Fresh Care [Attends Briefs Small]    Dexilant [Dexlansoprazole] Diarrhea   Nitrofurantoin     REACTION: sick--? hepatitis   Other     Metal staples caused swelling and infection    Ranitidine Diarrhea   Septra [Sulfamethoxazole-Trimethoprim]    Sulfamethoxazole-Trimethoprim Other (See Comments)    Immunization History  Administered Date(s) Administered   Influenza Split 06/26/2011, 05/18/2012, 04/25/2018   Influenza Whole 05/06/2009   Influenza, High Dose Seasonal PF 05/17/2015, 05/19/2017, 04/27/2019, 05/22/2021   Influenza-Unspecified 05/25/2016   PFIZER(Purple Top)SARS-COV-2 Vaccination 09/16/2019, 10/07/2019, 05/21/2020, 05/22/2021   PNEUMOCOCCAL CONJUGATE-20 05/22/2021   Pneumococcal Conjugate-13 08/26/2007   Pneumococcal Polysaccharide-23 08/20/2011   Tdap 08/21/2011   Zoster Recombinat (Shingrix) 09/28/2017, 05/22/2021   Zoster, Live 06/26/2011    Past Medical History:  Diagnosis Date   Arthritis    Bronchitis    hx of when smoked    Cancer ([place]) 04/16/09   kidney cancer - tx by [facility] per her report released from f/u   Cataracts, bilateral    Diverticulitis  11/2004   Endometriosis 1994   GERD (gastroesophageal reflux disease)    hx hiatal hernia, hx esophageal stricture s/p dilation   Headache    Hepatitis    History of chicken pox    History of hiatal hernia    History of measles    History of mumps as a child    Hx of hepatitis    with cirrhosis - per her report from macrodantin and eval in 2013 at [facility]   Hyperlipidemia    Left ear pain    Migraines    pt states vesticular migraines has dizziness in relation    Numbness and tingling    feet bilat has had for 40 years   Right knee pain    Scarlet fever    Shingles    Urinary tract bacterial infections    hx of   Vertigo    associated with headache, intermittent, chronic    Tobacco History: Social History   Tobacco Use  Smoking Status Former   Packs/day: 1.50   Years: 20.00   Pack years: 30.00   Types: Cigarettes   Quit date: 08/25/1990   Years since quitting: 30.8  Smokeless Tobacco Never   Counseling given: Yes   Outpatient Encounter Medications as of 06/27/2021  Medication Sig   cephALEXin (KEFLEX) 500 MG capsule Take 1 capsule (500 mg total) by mouth 3 (three) times daily for 10 days.   No facility-administered encounter medications on file as of 06/27/2021.     Review of Systems  Review of Systems  Constitutional: Negative.  Negative for chills and fever.  HENT: Negative.    Cardiovascular: Negative.   Gastrointestinal:  Positive for abdominal pain (LEFT). Negative for blood in stool, constipation, diarrhea, nausea, rectal pain and vomiting.  Allergic/Immunologic: Negative.   Neurological: Negative.   Psychiatric/Behavioral: Negative.        Physical Exam  BP 130/69   Pulse 75   Resp 18   LMP 08/25/1992   SpO2 95%   Wt Readings from Last 5 Encounters:  06/11/21 137 lb (62.1 kg)  05/14/20 147 lb 6.4 oz (66.9 kg)  11/21/19 157 lb (71.2 kg)  09/26/19 158 lb 9.6 oz (71.9 kg)  07/14/18 156 lb 6.4 oz (70.9 kg)     Physical Exam Vitals and  nursing note reviewed.  Constitutional:      General: She is not in acute distress.    Appearance: She is well-developed.  Cardiovascular:     Rate and Rhythm: Normal rate and regular rhythm.  Pulmonary:     Effort: Pulmonary effort is normal.     Breath sounds: Normal breath sounds.  Abdominal:     General: Abdomen is flat. Bowel sounds are decreased. There is no distension.     Palpations: Abdomen is soft.     Tenderness: There is abdominal tenderness in the left upper quadrant and left lower quadrant.    Neurological:     Mental Status: She is alert and oriented to person, place, and time.      Assessment & Plan:   Left sided abdominal pain Tenderness noted over left lower ribs - will order xray  Will order abdominal US - GI Imaging   Bland diet  Stay active  Stay well hydrated  UTI:  Patient reports history of Cystitis  UA in office showed small leuk - will order keflex  Follow up:  Follow up if needed - please go to the ED if symptoms become severe     Fenton Foy, NP 06/27/2021

## 2021-06-27 NOTE — Assessment & Plan Note (Signed)
Tenderness noted over left lower ribs - will order xray  Will order abdominal US - GI Imaging   Bland diet  Stay active  Stay well hydrated  UTI:  Patient reports history of Cystitis  UA in office showed small leuk - will order keflex  Follow up:  Follow up if needed - please go to the ED if symptoms become severe

## 2021-06-27 NOTE — Patient Instructions (Addendum)
Left side abdominal pain:  Tenderness noted over left lower ribs - will order xray  Will order abdominal US - GI Imaging   Bland diet  Stay active  Stay well hydrated  UTI:  Patient reports history of Cystitis  UA in office showed small leuk - will order keflex  Follow up:  Follow up if needed - please go to the ED if symptoms become severe

## 2021-07-02 LAB — URINE CULTURE

## 2021-07-02 LAB — URINALYSIS
Bilirubin, UA: NEGATIVE
Glucose, UA: NEGATIVE
Ketones, UA: NEGATIVE
Nitrite, UA: NEGATIVE
Protein,UA: NEGATIVE
RBC, UA: NEGATIVE
Specific Gravity, UA: 1.006 (ref 1.005–1.030)
Urobilinogen, Ur: 0.2 mg/dL (ref 0.2–1.0)
pH, UA: 7.5 (ref 5.0–7.5)

## 2021-07-04 ENCOUNTER — Ambulatory Visit (HOSPITAL_COMMUNITY): Payer: Medicare Other

## 2021-07-04 ENCOUNTER — Encounter (HOSPITAL_COMMUNITY): Payer: Self-pay

## 2021-07-04 ENCOUNTER — Other Ambulatory Visit: Payer: Self-pay

## 2021-07-04 ENCOUNTER — Ambulatory Visit (HOSPITAL_COMMUNITY)
Admission: RE | Admit: 2021-07-04 | Discharge: 2021-07-04 | Disposition: A | Discharge status: Home / Self Care | Payer: Medicare Other | Source: Ambulatory Visit | Attending: Nurse Practitioner | Admitting: Nurse Practitioner

## 2021-07-04 DIAGNOSIS — K828 Other specified diseases of gallbladder: Secondary | ICD-10-CM | POA: Diagnosis not present

## 2021-07-04 DIAGNOSIS — K7460 Unspecified cirrhosis of liver: Secondary | ICD-10-CM | POA: Diagnosis not present

## 2021-07-04 DIAGNOSIS — R109 Unspecified abdominal pain: Secondary | ICD-10-CM | POA: Diagnosis not present

## 2021-07-04 DIAGNOSIS — K7689 Other specified diseases of liver: Secondary | ICD-10-CM | POA: Diagnosis not present

## 2021-07-04 DIAGNOSIS — N281 Cyst of kidney, acquired: Secondary | ICD-10-CM | POA: Diagnosis not present

## 2021-07-19 ENCOUNTER — Encounter: Payer: Self-pay | Admitting: Family Medicine

## 2021-07-29 ENCOUNTER — Telehealth: Payer: Medicare Other | Admitting: Physician Assistant

## 2021-07-29 DIAGNOSIS — R3989 Other symptoms and signs involving the genitourinary system: Secondary | ICD-10-CM | POA: Diagnosis not present

## 2021-07-29 DIAGNOSIS — T3695XA Adverse effect of unspecified systemic antibiotic, initial encounter: Secondary | ICD-10-CM

## 2021-07-29 DIAGNOSIS — B379 Candidiasis, unspecified: Secondary | ICD-10-CM | POA: Diagnosis not present

## 2021-07-29 MED ORDER — CEPHALEXIN 500 MG PO CAPS: 500.0000 mg | ORAL_CAPSULE | Freq: Two times a day (BID) | ORAL | 0 refills | Status: DC

## 2021-07-29 MED ORDER — FLUCONAZOLE 150 MG PO TABS: 150.0000 mg | ORAL_TABLET | Freq: Once | ORAL | 0 refills | Status: AC

## 2021-07-29 NOTE — Patient Instructions (Signed)
Ivery Quale, thank you for joining Mar Daring, PA-C for today's virtual visit.  While this provider is not your primary care provider (PCP), if your PCP is located in our provider database this encounter information will be shared with them immediately following your visit.  Consent: (Patient) Brenda [name] provided verbal consent for this virtual visit at the beginning of the encounter.  Current Medications:  Current Outpatient Medications:    cephALEXin (KEFLEX) 500 MG capsule, Take 1 capsule (500 mg total) by mouth 2 (two) times daily., Disp: 14 capsule, Rfl: 0   fluconazole (DIFLUCAN) 150 MG tablet, Take 1 tablet (150 mg total) by mouth once for 1 dose. May take in 72 hrs if needed, Disp: 2 tablet, Rfl: 0   Medications ordered in this encounter:  Meds ordered this encounter  Medications   cephALEXin (KEFLEX) 500 MG capsule    Sig: Take 1 capsule (500 mg total) by mouth 2 (two) times daily.    Dispense:  14 capsule    Refill:  0    Order Specific Question:   Supervising Provider    Answer:   MILLER, BRIAN [[id number]]   fluconazole (DIFLUCAN) 150 MG tablet    Sig: Take 1 tablet (150 mg total) by mouth once for 1 dose. May take in 72 hrs if needed    Dispense:  2 tablet    Refill:  0    Order Specific Question:   Supervising Provider    Answer:   Noemi Chapel [[id number]]     *If you need refills on other medications prior to your next appointment, please contact your pharmacy*  Follow-Up: Call back or seek an in-person evaluation if the symptoms worsen or if the condition fails to improve as anticipated.  Other Instructions Atrophic Vaginitis Atrophic vaginitis is when the lining of the vagina becomes dry and thin. This is most common in women who have stopped having their periods (are in menopause). It usually starts when a woman is 2 to 75 years old. What are the causes? This condition is caused by a drop in a female hormone (estrogen). What increases the risk? You  are more likely to develop this condition if: You take certain medicines. You have had your ovaries taken out. You are being treated for cancer. You have given birth or are breastfeeding. You are more than 75 years old. You smoke. What are the signs or symptoms? Pain during sex. A feeling of pressure during sex. Bleeding during sex. Burning or itching in the vagina. Burning pain when you pee (urinate). Fluid coming from your vagina. Some people do not have symptoms. How is this treated? Using a lubricant before sex. Using a moisturizer in the vagina. Using estrogen in the vagina. In some cases, you may not need treatment. Follow these instructions at home: Medicines Take all medicines only as told by your doctor. This includes medicines for dryness. Do not use herbal medicines unless your doctor says it is okay. General instructions Talk with your doctor about treatment. Do not douche. Do not use scented: Sprays. Tampons. Soaps. If sex hurts, try using lubricants right before you have sex. Contact a doctor if: You have fluid coming from the vagina that is not like normal. You have a bad smell coming from your vagina. You have new symptoms. Your symptoms do not get better when treated. Your symptoms get worse. Summary This condition happens when the lining of the vagina becomes dry and thin. It is most common in  women who no longer have periods. Treatment may include using medicines for dryness. Call a doctor if your symptoms do not get better. This information is not intended to replace advice given to you by your health care provider. Make sure you discuss any questions you have with your health care provider. Document Revised: 02/09/2020 Document Reviewed: 02/09/2020 Elsevier Patient Education  2022 [employer].    If you have been instructed to have an in-person evaluation today at a local Urgent Care facility, please use the link below. It will take you to a  list of all of our available [facility], including address, phone number and hours of operation. Please do not delay care.  [facility]  If you or a family member do not have a primary care provider, use the link below to schedule a visit and establish care. When you choose a [facility] primary care physician or advanced practice provider, you gain a long-term partner in health. Find a Primary Care Provider  Learn more about [facility] in-office and virtual care options: [facility]

## 2021-07-29 NOTE — Progress Notes (Signed)
Virtual Visit Consent   Brenda [name], you are scheduled for a virtual visit with a [facility] provider today.     Just as with appointments in the office, your consent must be obtained to participate.  Your consent will be active for this visit and any virtual visit you may have with one of our providers in the next 365 days.     If you have a MyChart account, a copy of this consent can be sent to you electronically.  All virtual visits are billed to your insurance company just like a traditional visit in the office.    As this is a virtual visit, video technology does not allow for your provider to perform a traditional examination.  This may limit your provider's ability to fully assess your condition.  If your provider identifies any concerns that need to be evaluated in person or the need to arrange testing (such as labs, EKG, etc.), we will make arrangements to do so.     Although advances in technology are sophisticated, we cannot ensure that it will always work on either your end or our end.  If the connection with a video visit is poor, the visit may have to be switched to a telephone visit.  With either a video or telephone visit, we are not always able to ensure that we have a secure connection.     I need to obtain your verbal consent now.   Are you willing to proceed with your visit today?    JANNE FAULK has provided verbal consent on 07/29/2021 for a virtual visit (video or telephone).   Mar Daring, PA-C   Date: 07/29/2021 1:29 PM   Virtual Visit via Video Note   I, Mar Daring, connected with  NICHOLE NEYER  ([id number][birth year]) on 07/29/21 at  1:15 PM EST by a video-enabled telemedicine application and verified that I am speaking with the correct person using two identifiers.  Location: Patient: Virtual Visit Location Patient: Home Provider: Virtual Visit Location Provider: Home Office   I discussed the limitations of evaluation and management  by telemedicine and the availability of in person appointments. The patient expressed understanding and agreed to proceed.    History of Present Illness: Brenda [name] is a 75 y.o. who identifies as a female who was assigned female at birth, and is being seen today for possible UTI.  HPI: Urinary Tract Infection  This is a new problem. The current episode started yesterday. The problem occurs every urination. The problem has been gradually worsening. The quality of the pain is described as aching and burning. There has been no fever. Associated symptoms include frequency, hesitancy and urgency. Pertinent negatives include no chills, flank pain, hematuria, nausea or vomiting. Associated symptoms comments: dysuria. She has tried increased fluids (AZO tabs) for the symptoms. The treatment provided no relief. Her past medical history is significant for recurrent UTIs. There is no history of catheterization.   Recently treated with Keflex on 06/27/21. Husband gave her some amoxicillin he had on hand as well.   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 hepatitis   Other     Metal staples caused swelling and infection    Ranitidine Diarrhea   Septra [Sulfamethoxazole-Trimethoprim]    Sulfamethoxazole-Trimethoprim Other (See Comments)   Medications:  Current Outpatient Medications:    cephALEXin (KEFLEX) 500 MG capsule, Take 1 capsule (500 mg total) by mouth 2 (two) times daily., Disp: 14 capsule, Rfl: 0   fluconazole (DIFLUCAN)  150 MG tablet, Take 1 tablet (150 mg total) by mouth once for 1 dose. May take in 72 hrs if needed, Disp: 2 tablet, Rfl: 0  Observations/Objective: Patient is well-developed, well-nourished in no acute distress.  Resting comfortably at home.  Head is normocephalic, atraumatic.  No labored breathing.  Speech is clear and coherent with logical content.  Patient is alert and oriented at baseline.    Assessment and Plan: 1. Suspected UTI - cephALEXin (KEFLEX) 500 MG capsule; Take 1 capsule (500 mg total) by mouth 2 (two) times daily.  Dispense: 14 capsule; Refill: 0  2. Antibiotic-induced yeast infection - fluconazole (DIFLUCAN) 150 MG tablet; Take 1 tablet (150 mg total) by mouth once for 1 dose. May take in 72 hrs if needed  Dispense: 2 tablet; Refill: 0  - Suspect more atrophic vaginitis as symptoms but possible UTI - Will treat with Keflex as above - Gets yeast infections with antibiotics. Diflucan given  - Advised to seek further evaluation with PCP or GYN for evaluation of atropic vaginitis   Follow Up Instructions: I discussed the assessment and treatment plan with the patient. The patient was provided an opportunity to ask questions and all were answered. The patient agreed with the plan and demonstrated an understanding of the instructions.  A copy of instructions were sent to the patient via MyChart unless otherwise noted below.    The patient was advised to call back or seek an in-person evaluation if the symptoms worsen or if the condition fails to improve as anticipated.  Time:  I spent 13 minutes with the patient via telehealth technology discussing the above problems/concerns.    Mar Daring, PA-C

## 2021-07-30 ENCOUNTER — Ambulatory Visit: Payer: BC Managed Care – PPO | Admitting: Family Medicine

## 2021-08-07 ENCOUNTER — Encounter: Payer: Self-pay | Admitting: Obstetrics and Gynecology

## 2021-08-07 ENCOUNTER — Other Ambulatory Visit (HOSPITAL_COMMUNITY)
Admission: RE | Admit: 2021-08-07 | Discharge: 2021-08-07 | Disposition: A | Discharge status: Home / Self Care | Payer: Medicare Other | Source: Ambulatory Visit | Attending: Obstetrics and Gynecology | Admitting: Obstetrics and Gynecology

## 2021-08-07 ENCOUNTER — Other Ambulatory Visit: Payer: Self-pay

## 2021-08-07 ENCOUNTER — Ambulatory Visit (INDEPENDENT_AMBULATORY_CARE_PROVIDER_SITE_OTHER): Payer: Medicare Other | Admitting: Obstetrics and Gynecology

## 2021-08-07 VITALS — BP 138/72 | HR 92 | Ht 62.75 in | Wt 147.0 lb

## 2021-08-07 DIAGNOSIS — N760 Acute vaginitis: Secondary | ICD-10-CM | POA: Diagnosis not present

## 2021-08-07 DIAGNOSIS — N952 Postmenopausal atrophic vaginitis: Secondary | ICD-10-CM

## 2021-08-07 NOTE — Progress Notes (Signed)
GYNECOLOGY  VISIT   HPI: 75 y.o.   Married  Caucasian  female   [id number] with Patient's last menstrual period was 08/25/1992.   here for internal/external vaginal pain for 6 months, increasing.  Vagina feels dry.  No discharge or odor.   Used Vagifem in the past on chart review.  No new exposures at home.   Wearing pads all the time, due to some urinary leakage at times.   No recent abx.   Not sexually active for years.   14 grandchildren.  GYNECOLOGIC HISTORY: Patient's last menstrual period was 08/25/1992. 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   Cirrhosis ([place]) 12/08/2014   Esophageal reflux 12/08/2014   Esophageal stricture 12/08/2014   Vertigo 12/08/2014    Past Medical History:  Diagnosis Date   Arthritis    Bronchitis    hx of when smoked    Cancer ([place]) 04/16/09   kidney cancer - tx by [facility] per her report released from f/u   Cataracts, bilateral    Diverticulitis 11/2004   Endometriosis 1994   GERD (gastroesophageal reflux disease)    hx hiatal hernia, hx esophageal stricture s/p dilation   Headache    Hepatitis    History of chicken pox    History of hiatal hernia    History of measles    History of mumps as a child    Hx of hepatitis    with cirrhosis - per her report from macrodantin and eval in 2013 at [facility]   Hyperlipidemia    Left ear pain    Migraines    pt states vesticular migraines has dizziness in relation    Numbness and tingling    feet bilat has had for 40 years   Right knee pain     Scarlet fever    Shingles    Urinary tract bacterial infections    hx of   Vertigo    associated with headache, intermittent, chronic    Past Surgical History:  Procedure Laterality Date   ABDOMINAL HYSTERECTOMY  1994   TAH/BSO due to endometriosis   APPENDECTOMY     CATARACT EXTRACTION W/ INTRAOCULAR LENS IMPLANT Bilateral 05/2015, 04/2015   DIAGNOSTIC LAPAROSCOPY     LAPAROSCOPIC PARTIAL COLECTOMY N/A 04/10/2015   Procedure: LAPAROSCOPIC PARTIAL CECTOMY;  Surgeon: Ralene Ok, MD;  Location: [facility];  Service: General;  Laterality: N/A;   LUNG BIOPSY  1996   Negative   NEPHRECTOMY  04/16/09   Partial removal due to cancer   OOPHORECTOMY     TONSILLECTOMY     TONSILLECTOMY      Current Outpatient Medications  Medication Sig Dispense Refill   cycloSPORINE (RESTASIS) 0.05 % ophthalmic emulsion 1 drop into affected eye     pravastatin (PRAVACHOL) 20  MG tablet 1 tablet     venlafaxine (EFFEXOR) 75 MG tablet 1 tablet with food     No current facility-administered medications for this visit.     ALLERGIES: Codeine, [place]elle fresh care [attends briefs small], Dexilant [dexlansoprazole], Nitrofurantoin, Other, Ranitidine, Septra [sulfamethoxazole-trimethoprim], and Sulfamethoxazole-trimethoprim  Family History  Problem Relation Age of Onset   Cancer Father    Thyroid cancer Mother    Cancer Mother        thyroid/ renal cell ca    Colon cancer Neg Hx    Breast cancer Neg Hx     Social History   Socioeconomic History   Marital status: Married    Spouse name: Not on file   Number of children: 0   Years of education: Not on file   Highest education level: Not on file  Occupational History   Occupation: Retired    Fish farm manager: RETIRED  Tobacco Use   Smoking status: Former    Packs/day: 1.50    Years: 20.00    Pack years: 30.00    Types: Cigarettes    Quit date: 08/25/1990    Years since quitting: 30.9   Smokeless tobacco: Never  Vaping Use   Vaping Use: Never used   Substance and Sexual Activity   Alcohol use: No    Comment: Rare   Drug use: No   Sexual activity: Not Currently    Partners: Male    Birth control/protection: Surgical, Post-menopausal    Comment: TAH  Other Topics Concern   Not on file  Social History Narrative   Not on file   Social Determinants of Health   Financial Resource Strain: Not on file  Food Insecurity: Not on file  Transportation Needs: Not on file  Physical Activity: Not on file  Stress: Not on file  Social Connections: Not on file  Intimate Partner Violence: Not on file    Review of Systems  Genitourinary:  Positive for vaginal pain.  All other systems reviewed and are negative.  PHYSICAL EXAMINATION:    BP 138/72    Pulse 92    Ht 5' 2.75" (1.594 m)    Wt 147 lb (66.7 kg)    LMP 08/25/1992    SpO2 95%    BMI 26.25 kg/m     General appearance: alert, cooperative and appears stated age   Pelvic: External genitalia:  no lesions              Urethra:  normal appearing urethra with no masses, tenderness or lesions              Bartholins and Skenes: normal                 Vagina: normal appearing vagina with normal color and large amount of white creamy discharge, no lesions              Cervix: absent                Bimanual Exam:  Uterus: absent              Adnexa: no mass, fullness, tenderness           Chaperone was present for exam:  Estill Bamberg, CMA  ASSESSMENT  Vaginitis.  Vaginal atrophy.   PLAN  Vaginitis testing.  After any potential vaginal infection is treated, will treat atrophy with Vagifem 10 mcg.  She will also need to update her mammogram first.  Information on [facility] contact to patient.  Will need to follow up 6 weeks after starting treatment for atrophy.  We discussed avoiding irritants.  She may try a new pad and stop using dryer sheets.    An After Visit Summary was printed and given to the patient.  21 min  total time was spent for this patient encounter, including  preparation, face-to-face counseling with the patient, coordination of care, and documentation of the encounter.

## 2021-08-08 ENCOUNTER — Encounter: Payer: Self-pay | Admitting: Obstetrics and Gynecology

## 2021-08-08 LAB — CERVICOVAGINAL ANCILLARY ONLY
Bacterial Vaginitis (gardnerella): NEGATIVE
Candida Glabrata: NEGATIVE
Candida Vaginitis: NEGATIVE
Comment: NEGATIVE
Comment: NEGATIVE
Comment: NEGATIVE
Comment: NEGATIVE
Trichomonas: NEGATIVE

## 2021-08-12 ENCOUNTER — Ambulatory Visit
Admission: RE | Admit: 2021-08-12 | Discharge: 2021-08-12 | Disposition: A | Discharge status: Home / Self Care | Payer: Medicare Other | Source: Ambulatory Visit | Attending: Family Medicine | Admitting: Family Medicine

## 2021-08-12 ENCOUNTER — Telehealth: Payer: Self-pay | Admitting: *Deleted

## 2021-08-12 DIAGNOSIS — Z1231 Encounter for screening mammogram for malignant neoplasm of breast: Secondary | ICD-10-CM | POA: Diagnosis not present

## 2021-08-12 NOTE — Telephone Encounter (Signed)
See telephone encounter dated 08/12/21.   MyChart encounter closed.

## 2021-08-12 NOTE — Telephone Encounter (Signed)
You routed conversation to Brenda [name], Brenda [name], Brenda [name] Just now (4:33 PM)   You Just now (4:33 PM)   Spoke with patient.  Advised message received. MMG results pending.  Patient request return call once Rx has been sent to review Rx sent and instructions. Patient is aware of reason for updating MMG. Questions answered. Patient thankful for call.    Routing to Dr. Quincy [name]      Note    Brenda [name], [place] routed conversation to Brenda [name], Brenda [name], Brenda [name] 3 minutes ago (4:29 PM)   Brenda [name] (supporting Brenda [name], Brenda [name]) 9 minutes ago (4:23 PM)   Mammogram completed today. Please ask Dr. Quincy [name] to proceed with estrogen Rx. Phone to [place].    Brenda [name], Brenda [name] routed conversation to Brenda [name] 3 days ago   [name] (supporting Brenda [name], Brenda [name]) 3 days ago   Please have Dr. Harolyn [name] or your supervisor contact me by MyChart or phone [phone].    Brenda [name], Brenda [name]  Brenda [name] 4 days ago   That was the first available screening mammogram appointment however they always suggest that patients call for cancellations.  They have cancellations daily and you will likely be able to get in much sooner if you call each day and check with them.  [phone].    Brenda [name] (supporting Brenda [name], Brenda [name]) 4 days ago   I have no breast discomfort. My problem is Atrophic Vaginitis. I saw Dr. Harolyn [name] yesterday and she wanted me to have a mammogram before prescribing estrogen Brenda [name]) to relieve vaginal discomfort. Can you arrange an immediate mammogram for that diagnosis?    Brenda [name], Brenda [name]  Brenda [name] 4 days ago   I will cancel the screening mammogram and you will need to have an appointment  for breast exam so that we can order a diagnostic mammogram to assess for breast discomfort.  . That is our  provider's protocol-- Will not order diagnostic mammogram without a breast exam first. I can have someone from our appointment desk call you to arrange.    Brenda [name] (supporting Brenda [name], Brenda [name]) 4 days ago   Thank you but I am experiencing constant discomfort and would really like to have a much earlier appointment  if that is what has to be done to start the estrogen treatment.    Brenda [name], Brenda [name]  Brenda [name] 4 days ago   Brenda [name], Just want to make sure you are not having any breast problems and this is your screening mammogram?   Your last mammogram was 09/27/19 so you are past due for screening. Not sure what confusion caused someone to tell you that in the past.   I went ahead and scheduled you for the first available screening mammogram and that will be Tuesday January 27 at 2:30pm ([place] 2:20PM) at [facility]. If this appointment does not work for you you can call and rescheduled at [phone].   This is scheduled for screening mammogram so again, if any breast problem let us know because it will need to be scheduled differently.   Brenda [name]  Brenda [name] (supporting Brenda [name], Brenda [name]) 4  days ago   Because MyChart showed that I was overdue, [facility]. They replied that their records showed I had a recent mammogram done and could not have another until February 2023. If you want me to have one now, can you contact them and square that away?   Thanks, Brenda [name]

## 2021-08-12 NOTE — Telephone Encounter (Signed)
Spoke with patient.  Advised message received. MMG results pending.  Patient request return call once Rx has been sent to review Rx sent and instructions. Patient is aware of reason for updating MMG. Questions answered. Patient thankful for call.   Routing to [name]

## 2021-08-13 MED ORDER — ESTRADIOL 10 MCG VA TABS: ORAL_TABLET | VAGINAL | 3 refills | Status: DC

## 2021-08-13 NOTE — Telephone Encounter (Signed)
Left message for patient to call.

## 2021-08-13 NOTE — Telephone Encounter (Signed)
Patient aware, Rx sent. Message sent to appointments to schedule recheck in 6 weeks.

## 2021-08-13 NOTE — Telephone Encounter (Signed)
Mammogram report reviewed and no evidence of malignancy.  The report was routed to her PCP and not to me.   OK for her to begin Vagifem 10 mcg per vagina at hs x 2 weeks and then 10 mcg per vagina at hs twice weekly.  Disp:  34 RF:  3 (24 tablets).  She can also use cooking oils or KY Jelly to increased hydration of the vaginal and vulva, but I would not do this at the same time as the Vagifem.   Please have her do a recheck with me after doing 6 weeks of treatment.   On a side note, please have patient send any medical communication through office phone calls or the My Chart portal.  I received an email through my [facility] Email, which is not monitored for patient concerns or questions.

## 2021-08-15 NOTE — Telephone Encounter (Signed)
Scheduled for 09/25/21

## 2021-09-10 ENCOUNTER — Ambulatory Visit: Payer: BC Managed Care – PPO

## 2021-09-24 ENCOUNTER — Encounter: Payer: Self-pay | Admitting: Obstetrics and Gynecology

## 2021-09-25 ENCOUNTER — Other Ambulatory Visit: Payer: Self-pay

## 2021-09-25 ENCOUNTER — Encounter: Payer: Self-pay | Admitting: Obstetrics and Gynecology

## 2021-09-25 ENCOUNTER — Ambulatory Visit (INDEPENDENT_AMBULATORY_CARE_PROVIDER_SITE_OTHER): Payer: Medicare Other | Admitting: Obstetrics and Gynecology

## 2021-09-25 VITALS — BP 140/72 | HR 84 | Ht 62.75 in | Wt 147.0 lb

## 2021-09-25 DIAGNOSIS — N760 Acute vaginitis: Secondary | ICD-10-CM

## 2021-09-25 LAB — WET PREP FOR TRICH, YEAST, CLUE

## 2021-09-25 MED ORDER — ESTRADIOL 0.1 MG/GM VA CREA: TOPICAL_CREAM | VAGINAL | 1 refills | Status: DC

## 2021-09-25 MED ORDER — LIDOCAINE 5 % EX OINT: 1.0000 "application " | TOPICAL_OINTMENT | Freq: Three times a day (TID) | CUTANEOUS | 1 refills | Status: DC

## 2021-09-25 NOTE — Patient Instructions (Addendum)
Try Dove soap for sensitive skin.   Atrophic Vaginitis Atrophic vaginitis is when the lining of the vagina becomes dry and thin. This is most common in women who have stopped having their periods (are in menopause). It usually starts when a woman is 70 to 76 years old. What are the causes? This condition is caused by a drop in a female hormone (estrogen). What increases the risk? You are more likely to develop this condition if: You take certain medicines. You have had your ovaries taken out. You are being treated for cancer. You have given birth or are breastfeeding. You are more than 76 years old. You smoke. What are the signs or symptoms? Pain during sex. A feeling of pressure during sex. Bleeding during sex. Burning or itching in the vagina. Burning pain when you pee (urinate). Fluid coming from your vagina. Some people do not have symptoms. How is this treated? Using a lubricant before sex. Using a moisturizer in the vagina.  Options are KY Jelly, Replens, Astroglide, coconut oil, olive oil, or canola oil.  These do not cause infection.  Using estrogen in the vagina. In some cases, you may not need treatment. Follow these instructions at home: Medicines Take all medicines only as told by your doctor. This includes medicines for dryness. Do not use herbal medicines unless your doctor says it is okay. General instructions Talk with your doctor about treatment. Do not douche. Do not use scented: Sprays. Tampons. Soaps. If sex hurts, try using lubricants right before you have sex. Contact a doctor if: You have fluid coming from the vagina that is not like normal. You have a bad smell coming from your vagina. You have new symptoms. Your symptoms do not get better when treated. Your symptoms get worse. Summary This condition happens when the lining of the vagina becomes dry and thin. It is most common in women who no longer have periods. Treatment may include using  medicines for dryness. Call a doctor if your symptoms do not get better. This information is not intended to replace advice given to you by your health care provider. Make sure you discuss any questions you have with your health care provider. Document Revised: 02/09/2020 Document Reviewed: 02/09/2020 Elsevier Patient Education  [employer].

## 2021-09-25 NOTE — Progress Notes (Signed)
GYNECOLOGY  VISIT   HPI: 76 y.o.   Married  Caucasian  female   [id number] with Patient's last menstrual period was 08/25/1992.   here for follow up of vaginitis and vaginal atrophy.   The vaginal canal feels irritated one hundred percent of the time.  "Feels like some has taken sand and put it up in the vaginal and rubbed around." Left side is more irritated than the right side.  She clarifies that the area of most irritation is really the entrance to the vagina.  Negative vaginitis testing on 08/07/21.  She has been using Vagifem.  She used the Vagifem every night until it ran out.   No discharge.   No bleeding.   Takes a shower and uses Dial soap.  Using a perfume free laundry soap.   Wears pad for protection daily.  Has urinary leakage.  Leaks with sneeze or cough.  No extensive leakage.   Not sexually active.   GYNECOLOGIC HISTORY: Patient's last menstrual period was 08/25/1992. Contraception:  Hyst/BSO Menopausal hormone therapy:  none Last mammogram:  08-12-21 Neg/BiRads1 Last pap smear:    2009 Neg        OB History     Gravida  0   Para  0   Term  0   Preterm  0   AB  0   Living  0      SAB  0   IAB  0   Ectopic  0   Multiple  0   Live Births  0              Patient Active Problem List   Diagnosis Date Noted   Left sided abdominal pain 06/27/2021   Hyperlipidemia 11/22/2019   S/P partial resection of colon 04/10/2015   Cirrhosis ([place]) 12/08/2014   Esophageal reflux 12/08/2014   Esophageal stricture 12/08/2014   Vertigo 12/08/2014    Past Medical History:  Diagnosis Date   Arthritis    Bronchitis    hx of when smoked    Cancer ([place]) 04/16/09   kidney cancer - tx by [facility] per her report released from f/u   Cataracts, bilateral    Diverticulitis 11/2004   Endometriosis 1994   GERD (gastroesophageal reflux disease)    hx hiatal hernia, hx esophageal stricture s/p dilation   Headache    Hepatitis    History of  chicken pox    History of hiatal hernia    History of measles    History of mumps as a child    Hx of hepatitis    with cirrhosis - per her report from macrodantin and eval in [facility]   Hyperlipidemia    Left ear pain    Migraines    pt states vesticular migraines has dizziness in relation    Numbness and tingling    feet bilat has had for 40 years   Right knee pain    Scarlet fever    Shingles    Urinary tract bacterial infections    hx of   Vertigo    associated with headache, intermittent, chronic    Past Surgical History:  Procedure Laterality Date   ABDOMINAL HYSTERECTOMY  1994   TAH/BSO due to endometriosis   APPENDECTOMY     CATARACT EXTRACTION W/ INTRAOCULAR LENS IMPLANT Bilateral 05/2015, 04/2015   DIAGNOSTIC LAPAROSCOPY     LAPAROSCOPIC PARTIAL COLECTOMY N/A 04/10/2015   Procedure: LAPAROSCOPIC PARTIAL CECTOMY;  Surgeon: Ralene Ok, MD;  Location:  [facility];  Service: General;  Laterality: N/A;   LUNG BIOPSY  1996   Negative   NEPHRECTOMY  04/16/09   Partial removal due to cancer   OOPHORECTOMY     TONSILLECTOMY     TONSILLECTOMY      Current Outpatient Medications  Medication Sig Dispense Refill   Estradiol 10 MCG TABS vaginal tablet One tablet vaginally at bedtime for 2 weeks only. After 2 weeks start on 1 tablet vaginally twice weekly 34 tablet 3   No current facility-administered medications for this visit.     ALLERGIES: Codeine, Dexilant [dexlansoprazole], Nitrofurantoin, Other, Ranitidine, Septra [sulfamethoxazole-trimethoprim], and Sulfamethoxazole-trimethoprim  Family History  Problem Relation Age of Onset   Cancer Father    Thyroid cancer Mother    Cancer Mother        thyroid/ renal cell ca    Colon cancer Neg Hx    Breast cancer Neg Hx     Social History   Socioeconomic History   Marital status: Married    Spouse name: Not on file   Number of children: 0   Years of education: Not on file   Highest education level: Not on file   Occupational History   Occupation: Retired    Fish farm manager: RETIRED  Tobacco Use   Smoking status: Former    Packs/day: 1.50    Years: 20.00    Pack years: 30.00    Types: Cigarettes    Quit date: 08/25/1990    Years since quitting: 31.1   Smokeless tobacco: Never  Vaping Use   Vaping Use: Never used  Substance and Sexual Activity   Alcohol use: No    Comment: Rare   Drug use: No   Sexual activity: Not Currently    Partners: Male    Birth control/protection: Surgical, Post-menopausal    Comment: TAH  Other Topics Concern   Not on file  Social History Narrative   Not on file   Social Determinants of Health   Financial Resource Strain: Not on file  Food Insecurity: Not on file  Transportation Needs: Not on file  Physical Activity: Not on file  Stress: Not on file  Social Connections: Not on file  Intimate Partner Violence: Not on file    Review of Systems  All other systems reviewed and are negative.  PHYSICAL EXAMINATION:    BP 140/72    Pulse 84    Ht 5' 2.75" (1.594 m)    Wt 147 lb (66.7 kg)    LMP 08/25/1992    SpO2 99%    BMI 26.25 kg/m     General appearance: alert, cooperative and appears stated age   Pelvic: External genitalia:  no lesions.  Tender introitus left > right.               Urethra:  normal appearing urethra with no masses, tenderness or lesions              Bartholins and Skenes: normal                 Vagina: normal appearing vagina with normal color and large amount of white creamy discharge, no lesions              Cervix: absent                Bimanual Exam:  Uterus:  normal size, contour, position, consistency, mobility, non-tender              Adnexa: no mass,  fullness, tenderness    Chaperone was present for exam:  Estill Bamberg, CMA  ASSESSMENT  Vulvovaginitis.  Vaginal atrophy.  Urinary incontinence.   PLAN  Wet prep:  negative yeast, trichomonas, clue cells.  Switch to vaginal estrogen cream 1/2 gram pv to vagina and vulva at hs  three times per week.  Lidocaine ointment 5% to vulva tid prn.  Will try gabapentin cream 6% in neutral base tid if vaginal estrogen cream and Lidocaine ointment are not successful.  She will do Kegel's. She will switch to [employer].  Fu 4 weeks.   An After Visit Summary was printed and given to the patient.  30 min  total time was spent for this patient encounter, including preparation, face-to-face counseling with the patient, coordination of care, and documentation of the encounter.

## 2021-09-25 NOTE — Telephone Encounter (Signed)
I called patient to discuss and found out she is schedule for a visit today. I advised patient to discuss with Dr.Silva.

## 2021-09-28 ENCOUNTER — Emergency Department (HOSPITAL_COMMUNITY): Payer: Medicare Other

## 2021-09-28 ENCOUNTER — Emergency Department (HOSPITAL_COMMUNITY)
Admission: EM | Admit: 2021-09-28 | Discharge: 2021-09-28 | Disposition: A | Discharge status: Home / Self Care | Payer: Medicare Other | Attending: Emergency Medicine | Admitting: Emergency Medicine

## 2021-09-28 ENCOUNTER — Encounter (HOSPITAL_COMMUNITY): Payer: Self-pay

## 2021-09-28 ENCOUNTER — Other Ambulatory Visit: Payer: Self-pay

## 2021-09-28 DIAGNOSIS — S199XXA Unspecified injury of neck, initial encounter: Secondary | ICD-10-CM | POA: Diagnosis not present

## 2021-09-28 DIAGNOSIS — R202 Paresthesia of skin: Secondary | ICD-10-CM | POA: Insufficient documentation

## 2021-09-28 DIAGNOSIS — R9431 Abnormal electrocardiogram [ECG] [EKG]: Secondary | ICD-10-CM | POA: Diagnosis not present

## 2021-09-28 DIAGNOSIS — Z20822 Contact with and (suspected) exposure to covid-19: Secondary | ICD-10-CM | POA: Insufficient documentation

## 2021-09-28 DIAGNOSIS — R531 Weakness: Secondary | ICD-10-CM | POA: Insufficient documentation

## 2021-09-28 DIAGNOSIS — I10 Essential (primary) hypertension: Secondary | ICD-10-CM | POA: Diagnosis not present

## 2021-09-28 DIAGNOSIS — I6782 Cerebral ischemia: Secondary | ICD-10-CM | POA: Insufficient documentation

## 2021-09-28 DIAGNOSIS — W19XXXA Unspecified fall, initial encounter: Secondary | ICD-10-CM | POA: Diagnosis not present

## 2021-09-28 LAB — COMPREHENSIVE METABOLIC PANEL
ALT: 10 U/L (ref 0–44)
AST: 28 U/L (ref 15–41)
Albumin: 3.7 g/dL (ref 3.5–5.0)
Alkaline Phosphatase: 98 U/L (ref 38–126)
Anion gap: 9 (ref 5–15)
BUN: 6 mg/dL — ABNORMAL LOW (ref 8–23)
CO2: 25 mmol/L (ref 22–32)
Calcium: 8.8 mg/dL — ABNORMAL LOW (ref 8.9–10.3)
Chloride: 97 mmol/L — ABNORMAL LOW (ref 98–111)
Creatinine, Ser: 0.97 mg/dL (ref 0.44–1.00)
GFR, Estimated: >60 mL/min (ref >60)
Glucose, Bld: 132 mg/dL — ABNORMAL HIGH (ref 70–99)
Potassium: 4.3 mmol/L (ref 3.5–5.1)
Sodium: 131 mmol/L — ABNORMAL LOW (ref 135–145)
Total Bilirubin: 1 mg/dL (ref 0.3–1.2)
Total Protein: 7.1 g/dL (ref 6.5–8.1)

## 2021-09-28 LAB — CBC
HCT: 42.6 % (ref 36.0–46.0)
Hemoglobin: 14 g/dL (ref 12.0–15.0)
MCH: 30.9 pg (ref 26.0–34.0)
MCHC: 32.9 g/dL (ref 30.0–36.0)
MCV: 94 fL (ref 80.0–100.0)
Platelets: 277 10*3/uL (ref 150–400)
RBC: 4.53 MIL/uL (ref 3.87–5.11)
RDW: 14.5 % (ref 11.5–15.5)
WBC: 6.1 10*3/uL (ref 4.0–10.5)
nRBC: 0 % (ref 0.0–0.2)

## 2021-09-28 LAB — DIFFERENTIAL
Abs Immature Granulocytes: 0.07 10*3/uL (ref 0.00–0.07)
Basophils Absolute: 0.1 10*3/uL (ref 0.0–0.1)
Basophils Relative: 1 %
Eosinophils Absolute: 0.1 10*3/uL (ref 0.0–0.5)
Eosinophils Relative: 1 %
Immature Granulocytes: 1 %
Lymphocytes Relative: 21 %
Lymphs Abs: 1.3 10*3/uL (ref 0.7–4.0)
Monocytes Absolute: 0.8 10*3/uL (ref 0.1–1.0)
Monocytes Relative: 13 %
Neutro Abs: 3.8 10*3/uL (ref 1.7–7.7)
Neutrophils Relative %: 63 %

## 2021-09-28 LAB — APTT: aPTT: 29 seconds (ref 24–36)

## 2021-09-28 LAB — PROTIME-INR
INR: 1.1 (ref 0.8–1.2)
Prothrombin Time: 14.3 seconds (ref 11.4–15.2)

## 2021-09-28 LAB — RESP PANEL BY RT-PCR (FLU A&B, COVID) ARPGX2
Influenza A by PCR: NEGATIVE
Influenza B by PCR: NEGATIVE
SARS Coronavirus 2 by RT PCR: NEGATIVE

## 2021-09-28 LAB — CBG MONITORING, ED: Glucose-Capillary: 116 mg/dL — ABNORMAL HIGH (ref 70–99)

## 2021-09-28 NOTE — ED Notes (Signed)
Pt ambulated around room without difficulty

## 2021-09-28 NOTE — ED Triage Notes (Signed)
BIB EMS from home after a fall, pt reports "my left leg just went out," pt reports left arm weakness since yesterday and left leg since Thursday. Pt denies LOC, is not on blood thinners, EMS reports left arm drift. Pt denies dizziness and main c/o is of thoracic spinal pain

## 2021-09-28 NOTE — Discharge Instructions (Addendum)
Return to ED with any new or worsening symptoms such as loss of consciousness and collapse, facial drooping, slurring of words Please follow-up with neurology.  I have referred you and there is number attached to this discharge paperwork Your lab work was revealing of a slightly decreased level of sodium.  Please replenish her sodium by drinking a Gatorade, salty foods or crackers at home

## 2021-09-28 NOTE — ED Provider Notes (Signed)
[facility]ERGENC[facility] DEPARTMENT Provider Note   CSN: [id number] Arrival date & time: 09/28/21  1444     History  Chief Complaint  Patient presents with   Weakness    Brenda [name] is a 76 y.o. female with history of diverticulitis, endometriosis, cancer, GERD.  Patient states that since Thursday she has had left-sided lower extremity weakness.  Patient states that this morning at 9 AM, she was walking in her home when her left leg suddenly gave out causing her to fall to the floor and hit her head.  Patient denies being on blood thinners.  Patient's husband came to her aid at this time and reports that the patient was "grumbling" after her fall for about 2 to 3 minutes making nonsensical noises.  Patient's husband reports that these resolved on their own by the time EMS arrived.  Patient is still complaining of left-sided lower extremity weakness.  Patient states that her left leg feels like it is tingling.  Patient denies any shortness of breath, chest pain, abdominal pain, fevers, recent illnesses.   Weakness Associated symptoms: no abdominal pain, no chest pain, no dizziness, no fever, no nausea, no shortness of breath and no vomiting       Home Medications Prior to Admission medications   Medication Sig Start Date End Date Taking? Authorizing Provider  estradiol (ESTRACE) 0.1 MG/GM vaginal cream Use 1/2 g vaginally and to vulva three times per week as needed to maintain symptom relief. 09/25/21   Nunzio Cobbs, MD  lidocaine (XYLOCAINE) 5 % ointment Apply 1 application topically 3 (three) times daily. 09/25/21   Nunzio Cobbs, MD      Allergies    Codeine, Dexilant [dexlansoprazole], Nitrofurantoin, Other, Ranitidine, Septra [sulfamethoxazole-trimethoprim], and Sulfamethoxazole-trimethoprim    Review of Systems   Review of Systems  Constitutional:  Negative for chills and fever.  Respiratory:  Negative for shortness of breath.    Cardiovascular:  Negative for chest pain.  Gastrointestinal:  Negative for abdominal pain, nausea and vomiting.  Neurological:  Positive for weakness and numbness (Described as tingling). Negative for dizziness.  All other systems reviewed and are negative.  Physical Exam Updated Vital Signs BP (!) 158/78 (BP Location: Left Arm)    Pulse 80    Temp 97.8 F (36.6 C) (Oral)    Resp 16    Ht 5' 2.75" (1.594 m)    Wt 66.7 kg    LMP 08/25/1992    SpO2 100%    BMI 26.25 kg/m  Physical Exam Vitals and nursing note reviewed.  Constitutional:      General: She is not in acute distress.    Appearance: She is not ill-appearing, toxic-appearing or diaphoretic.  HENT:     Head: Normocephalic and atraumatic.     Nose: Nose normal.     Mouth/Throat:     Mouth: Mucous membranes are moist.  Eyes:     Extraocular Movements: Extraocular movements intact.     Pupils: Pupils are equal, round, and reactive to light.  Cardiovascular:     Rate and Rhythm: Normal rate and regular rhythm.  Pulmonary:     Effort: Pulmonary effort is normal.     Breath sounds: Normal breath sounds. No wheezing.  Abdominal:     General: Abdomen is flat.     Palpations: Abdomen is soft.     Tenderness: There is no abdominal tenderness.  Musculoskeletal:        General: Normal  range of motion.     Cervical back: Normal range of motion. No rigidity or tenderness.  Skin:    General: Skin is warm and dry.     Capillary Refill: Capillary refill takes less than 2 seconds.  Neurological:     Mental Status: She is alert.     GCS: GCS eye subscore is 4. GCS verbal subscore is 5. GCS motor subscore is 6.     Cranial Nerves: Cranial nerves 2-12 are intact. No cranial nerve deficit or dysarthria.     Sensory: Sensation is intact. No sensory deficit.     Motor: Motor function is intact. No weakness.     Coordination: Coordination is intact.     Comments: Patient has 5 out of 5 strength noted to bilateral lower extremities.  5  out of 5 strength of bilateral upper extremities.    ED Results / Procedures / Treatments   Labs (all labs ordered are listed, but only abnormal results are displayed) Labs Reviewed  COMPREHENSIVE METABOLIC PANEL - Abnormal; Notable for the following components:      Result Value   Sodium 131 (*)    Chloride 97 (*)    Glucose, Bld 132 (*)    BUN 6 (*)    Calcium 8.8 (*)    All other components within normal limits  CBG MONITORING, ED - Abnormal; Notable for the following components:   Glucose-Capillary 116 (*)    All other components within normal limits  RESP PANEL BY RT-PCR (FLU A&B, COVID) ARPGX2  PROTIME-INR  APTT  CBC  DIFFERENTIAL  URINALYSIS, ROUTINE W REFLEX MICROSCOPIC    EKG EKG Interpretation  Date/Time:  Saturday September 28 2021 14:47:07 EST Ventricular Rate:  95 PR Interval:  115 QRS Duration: 92 QT Interval:  370 QTC Calculation: 466 R Axis:   67 Text Interpretation: Sinus rhythm Borderline short PR interval No significant change since last tracing Confirmed by Calvert Cantor [phone]) on 09/28/2021 4:13:20 PM  Radiology CT HEAD WO CONTRAST  Result Date: 09/28/2021 CLINICAL DATA:  Neck trauma.  Weakness.  Fall. EXAM: CT HEAD WITHOUT CONTRAST CT CERVICAL SPINE WITHOUT CONTRAST TECHNIQUE: Multidetector CT imaging of the head and cervical spine was performed following the standard protocol without intravenous contrast. Multiplanar CT image reconstructions of the cervical spine were also generated. RADIATION DOSE REDUCTION: This exam was performed according to the departmental dose-optimization program which includes automated exposure control, adjustment of the mA and/or kV according to patient size and/or use of iterative reconstruction technique. COMPARISON:  None. FINDINGS: CT HEAD FINDINGS Brain: No evidence of acute infarction, hemorrhage, hydrocephalus, extra-axial collection or mass lesion/mass effect. There is mild diffuse low-attenuation within the  subcortical and periventricular white matter compatible with chronic microvascular disease. Prominence of the sulci and ventricles compatible with age related brain atrophy. Vascular: No hyperdense vessel or unexpected calcification. Skull: Normal. Negative for fracture or focal lesion. Sinuses/Orbits: No acute finding. Other: None CT CERVICAL SPINE FINDINGS Alignment: Normal. Skull base and vertebrae: No acute fracture. No primary bone lesion or focal pathologic process. Soft tissues and spinal canal: No prevertebral fluid or swelling. No visible canal hematoma. Disc levels: Disc space narrowing and endplate spurring is identified at C5-6. Upper chest: Negative. Other: None IMPRESSION: 1. No acute intracranial abnormality. 2. Chronic microvascular disease and brain atrophy. 3. No evidence for acute cervical spine fracture or subluxation. 4. Cervical degenerative disc disease. Electronically Signed   By: Kerby Moors M.D.   On: 09/28/2021 15:53   CT  Cervical Spine Wo Contrast  Result Date: 09/28/2021 CLINICAL DATA:  Neck trauma.  Weakness.  Fall. EXAM: CT HEAD WITHOUT CONTRAST CT CERVICAL SPINE WITHOUT CONTRAST TECHNIQUE: Multidetector CT imaging of the head and cervical spine was performed following the standard protocol without intravenous contrast. Multiplanar CT image reconstructions of the cervical spine were also generated. RADIATION DOSE REDUCTION: This exam was performed according to the departmental dose-optimization program which includes automated exposure control, adjustment of the mA and/or kV according to patient size and/or use of iterative reconstruction technique. COMPARISON:  None. FINDINGS: CT HEAD FINDINGS Brain: No evidence of acute infarction, hemorrhage, hydrocephalus, extra-axial collection or mass lesion/mass effect. There is mild diffuse low-attenuation within the subcortical and periventricular white matter compatible with chronic microvascular disease. Prominence of the sulci and  ventricles compatible with age related brain atrophy. Vascular: No hyperdense vessel or unexpected calcification. Skull: Normal. Negative for fracture or focal lesion. Sinuses/Orbits: No acute finding. Other: None CT CERVICAL SPINE FINDINGS Alignment: Normal. Skull base and vertebrae: No acute fracture. No primary bone lesion or focal pathologic process. Soft tissues and spinal canal: No prevertebral fluid or swelling. No visible canal hematoma. Disc levels: Disc space narrowing and endplate spurring is identified at C5-6. Upper chest: Negative. Other: None IMPRESSION: 1. No acute intracranial abnormality. 2. Chronic microvascular disease and brain atrophy. 3. No evidence for acute cervical spine fracture or subluxation. 4. Cervical degenerative disc disease. Electronically Signed   By: Kerby Moors M.D.   On: 09/28/2021 15:53    Procedures Procedures    Medications Ordered in ED Medications - No data to display  ED Course/ Medical Decision Making/ A&P Clinical Course as of 09/28/21 1715  Sat Sep 28, 2021  1455 I evaluated patient for screening exam.  She has been having some left-sided weakness that started on Thursday.  This was most prominent in the left upper extremity.  She also apparently had a left facial droop.  Today she felt like her left leg started giving out and she ended up falling.  She hit her head.  She is not on blood thinners.  She did not have loss of consciousness.  No other concerning neurological symptoms.  Neuro exam is completely intact with no notable findings of left-sided weakness.  I cannot really see a left-sided droop either.  I put an initial screening labs as well as head CT.  Will defer to primary team for further management. [GL]    Clinical Course User Index [GL] Sherre Poot Cherlyn Roberts                           Medical Decision Making  76 year old female presents to ED for evaluation of left lower extremity weakness since Thursday.  On examination, patient  is afebrile, nontachycardic, not hypoxic, no facial droop, no neurodeficits noted on exam.  Patient has 5 out of 5 strength to bilateral lower extremities.  Patient endorsing "tingling" to the left lower leg.  Labs ordered and personally interpreted by me include: CBG, CMP, urinalysis, PT/INR, PTT, CT head, CT C-spine CBG: 116 CMP: Patient is slightly hyponatremic at 131, patient is able to tolerate p.o.  We will advised the patient to replenish utilizing Pedialyte/Gatorade. UA: Pending PT/INR: PT 14.3, INR 1.1 PTT: 29 CT head: No intracranial abnormality noted, no signs of ischemia or collection of fluid. CT C-spine: No acute fractures  When I went in to reevaluate this patient, she was standing up on her own  strength trying to disconnect herself from her cords/leads.  The patient states that she still felt "a little weak" but that her lower extremity weakness had resolved.  Patient was able to ambulate around the room under her own power without issue.  I had a shared decision-making conversation with the patient and her husband about further imaging studies but at this time they do not feel the need to pursue that.  They just wish to follow-up with a neurologist outpatient.  At this time, I feel this patient is stable for discharge.  I discussed the patient and her case with Dr. Karle Starch who is in agreement.  I feel this patient will need outpatient follow-up with neurology, and I will refer her here today.  I have advised the patient of return precautions and she voiced understanding.  I have answered all the questions of the patient to her satisfaction.  The patient is stable at time discharge.    Final Clinical Impression(s) / ED Diagnoses Final diagnoses:  Weakness    Rx / DC Orders ED Discharge Orders     None         Lawana Chambers 09/28/21 1716    Truddie Hidden, MD 09/28/21 [phone]

## 2021-10-01 ENCOUNTER — Encounter: Payer: Self-pay | Admitting: Family Medicine

## 2021-10-01 ENCOUNTER — Encounter: Payer: Self-pay | Admitting: Neurology

## 2021-10-16 NOTE — Telephone Encounter (Signed)
Patient was called and VM left to give office a call back to discuss options per PCP

## 2021-10-22 NOTE — Progress Notes (Addendum)
GYNECOLOGY  VISIT ?  ?HPI: ?76 y.o.   Married  Caucasian  female   ?[id number] with Patient's last menstrual period was 08/25/1992.   ?here for 4 week follow up.  Husband is present today.  ? ?Started vaginal estrogen cream and lidocaine for vulvovaginitis and vaginal atrophy.  ?States she still has burning all of the time at the opening of the vagina.  ? ?Some more external lateral itching.  ? ?No discharge or odor.  ? ?Urinary incontinence is somewhat better.  ?Using a Poise pad.  ? ?GYNECOLOGIC HISTORY: ?Patient's last menstrual period was 08/25/1992. ?Contraception:  Hyst/BSO ?Menopausal hormone therapy:  none ?Last mammogram:  08-12-21 Neg/[facility] ?Last pap smear:   2009 Neg ?       ?OB History   ? ? Gravida  ?0  ? Para  ?0  ? Term  ?0  ? Preterm  ?0  ? AB  ?0  ? Living  ?0  ?  ? ? SAB  ?0  ? IAB  ?0  ? Ectopic  ?0  ? Multiple  ?0  ? Live Births  ?0  ?   ?  ?  ?    ? ?Patient Active Problem List  ? Diagnosis Date Noted  ? Left sided abdominal pain 06/27/2021  ? Hyperlipidemia 11/22/2019  ? S/P partial resection of colon 04/10/2015  ? Cirrhosis ([place]) 12/08/2014  ? Esophageal reflux 12/08/2014  ? Esophageal stricture 12/08/2014  ? Vertigo 12/08/2014  ? ? ?Past Medical History:  ?Diagnosis Date  ? Arthritis   ? Bronchitis   ? hx of when smoked   ? Cancer [facility]) 04/16/09  ? kidney cancer - tx by [facility] per her report released from f/u  ? Cataracts, bilateral   ? Diverticulitis 11/2004  ? Endometriosis 1994  ? GERD (gastroesophageal reflux disease)   ? hx hiatal hernia, hx esophageal stricture s/p dilation  ? Headache   ? Hepatitis   ? History of chicken pox   ? History of hiatal hernia   ? History of measles   ? History of mumps as a child   ? Hx of hepatitis   ? with cirrhosis - per her report from [place] and eval in 2013 at [facility]  ? Hyperlipidemia   ? Left ear pain   ? Migraines   ? pt states vesticular migraines has dizziness in relation   ? Numbness and tingling   ? feet bilat has had for 40 years  ?  Right knee pain   ? Scarlet fever   ? Shingles   ? Urinary tract bacterial infections   ? hx of  ? Vertigo   ? associated with headache, intermittent, chronic  ? ? ?Past Surgical History:  ?Procedure Laterality Date  ? ABDOMINAL HYSTERECTOMY  1994  ? TAH/BSO due to endometriosis  ? APPENDECTOMY    ? CATARACT EXTRACTION W/ INTRAOCULAR LENS IMPLANT Bilateral 05/2015, 04/2015  ? DIAGNOSTIC LAPAROSCOPY    ? LAPAROSCOPIC PARTIAL COLECTOMY N/A 04/10/2015  ? Procedure: LAPAROSCOPIC PARTIAL CECTOMY;  Surgeon: Ralene Ok, MD;  Location: [facility];  Service: General;  Laterality: N/A;  ? [place]  ? Negative  ? NEPHRECTOMY  04/16/09  ? Partial removal due to cancer  ? OOPHORECTOMY    ? TONSILLECTOMY    ? TONSILLECTOMY    ? ? ?Current Outpatient Medications  ?Medication Sig Dispense Refill  ? aspirin EC 81 MG tablet Take 81 mg by mouth daily. Swallow whole.    ?  estradiol (ESTRACE) 0.1 MG/GM vaginal cream Use 1/2 g vaginally and to vulva three times per week as needed to maintain symptom relief. 42.5 g 1  ? lidocaine (XYLOCAINE) 5 % ointment Apply 1 application topically 3 (three) times daily. 1.25 g 1  ? omeprazole (PRILOSEC) 20 MG capsule Take 20 mg by mouth daily.    ? ?No current facility-administered medications for this visit.  ?  ? ?ALLERGIES: Codeine, Dexilant [dexlansoprazole], Nitrofurantoin, Other, Ranitidine, Septra [sulfamethoxazole-trimethoprim], and Sulfamethoxazole-trimethoprim ? ?Family History  ?Problem Relation Age of Onset  ? Cancer Father   ? Thyroid cancer Mother   ? Cancer Mother   ?     thyroid/ renal cell ca   ? Colon cancer Neg Hx   ? Breast cancer Neg Hx   ? ? ?Social History  ? ?Socioeconomic History  ? Marital status: Married  ?  Spouse name: Not on file  ? Number of children: 0  ? Years of education: Not on file  ? Highest education level: Not on file  ?Occupational History  ? Occupation: Retired  ?  Employer: RETIRED  ?Tobacco Use  ? Smoking status: Former  ?  Packs/day: 1.50  ?  Years:  20.00  ?  Pack years: 30.00  ?  Types: Cigarettes  ?  Quit date: 08/25/1990  ?  Years since quitting: 31.1  ? Smokeless tobacco: Never  ?Vaping Use  ? Vaping Use: Never used  ?Substance and Sexual Activity  ? Alcohol use: No  ?  Comment: Rare  ? Drug use: No  ? Sexual activity: Not Currently  ?  Partners: Male  ?  Birth control/protection: Surgical, Post-menopausal  ?  Comment: TAH  ?Other Topics Concern  ? Not on file  ?Social History Narrative  ? Not on file  ? ?Social Determinants of Health  ? ?Financial Resource Strain: Not on file  ?Food Insecurity: Not on file  ?Transportation Needs: Not on file  ?Physical Activity: Not on file  ?Stress: Not on file  ?Social Connections: Not on file  ?Intimate Partner Violence: Not on file  ? ? ?Review of Systems  ?All other systems reviewed and are negative. ? ?PHYSICAL EXAMINATION:   ? ?BP (!) 158/78   Pulse 73   Ht 5' 2.75" (1.594 m)   Wt 147 lb (66.7 kg)   LMP 08/25/1992   SpO2 98%   BMI 26.25 kg/m?     ?General appearance: alert, cooperative and appears stated age ?  ?Pelvic: External genitalia:  erythema of the vulva.  ?             Urethra:  normal appearing urethra with no masses, tenderness or lesions ?             Bartholins and Skenes: normal    ?             Vagina: normal appearing vagina with normal color and whitish discharge, no lesions ?             Cervix: absent ?               ?Bimanual Exam:  Uterus:  absent ?             Adnexa: no mass, fullness, tenderness ?             ? ?Chaperone was present for exam:  Estill Bamberg, CMA ? ?ASSESSMENT ? ?Vulvovaginitis.  ?Vulvodynia.  ? ?PLAN ? ?Information on vulvodynia.  ?Nuswab.  ?Diflucan 150 mg po x  1.  Repeat in 72 hours.  ?Use Vaseline on vulva as needed. ?Gabapentin 6% cream in neutral base to vulva tid.  ?Ok to use Monistat now.  ?Continue vaginal estrogen cream. ?Fu in 6 weeks.  ?  ?An After Visit Summary was printed and given to the patient. ? ?31 min  total time was spent for this patient encounter,  including preparation, face-to-face counseling with the patient, coordination of care, and documentation of the encounter. ? ? ?

## 2021-10-24 ENCOUNTER — Telehealth: Payer: Self-pay

## 2021-10-24 ENCOUNTER — Other Ambulatory Visit (HOSPITAL_COMMUNITY)
Admission: RE | Admit: 2021-10-24 | Discharge: 2021-10-24 | Disposition: A | Discharge status: Home / Self Care | Payer: Medicare Other | Source: Ambulatory Visit | Attending: Obstetrics and Gynecology | Admitting: Obstetrics and Gynecology

## 2021-10-24 ENCOUNTER — Other Ambulatory Visit: Payer: Self-pay

## 2021-10-24 ENCOUNTER — Encounter: Payer: Self-pay | Admitting: Obstetrics and Gynecology

## 2021-10-24 ENCOUNTER — Ambulatory Visit (INDEPENDENT_AMBULATORY_CARE_PROVIDER_SITE_OTHER): Payer: Medicare Other | Admitting: Obstetrics and Gynecology

## 2021-10-24 VITALS — BP 158/78 | HR 73 | Ht 62.75 in | Wt 147.0 lb

## 2021-10-24 DIAGNOSIS — N94819 Vulvodynia, unspecified: Secondary | ICD-10-CM

## 2021-10-24 DIAGNOSIS — N760 Acute vaginitis: Secondary | ICD-10-CM

## 2021-10-24 MED ORDER — NONFORMULARY OR COMPOUNDED ITEM: 1 refills | Status: DC

## 2021-10-24 MED ORDER — FLUCONAZOLE 150 MG PO TABS: 150.0000 mg | ORAL_TABLET | Freq: Once | ORAL | 0 refills | Status: AC

## 2021-10-24 NOTE — Patient Instructions (Signed)
Vulvodynia ?Vulvodynia is long-lasting (chronic) pain in the external female genitalia (vulva). This condition may also be referred to as vulvar pain. The vulva includes tissues surrounding the vaginal opening and the urethra. ?There are two main types of vulvar pain: ?Localized vulvar pain. This is pain that affects a specific area of the vulva. ?Vulvar vestibulitis syndrome (VVS) is a type of localized vulvar pain in which pain is only felt around the opening (vestibule) of the vagina. ?Generalized vulvar pain. This is pain that affects: ?The entire vulva. ?Multiple areas of the vulva. ?One side of the vulva. ?What are the causes? ?The cause of this condition is often not known. Many factors may contribute to it. Vulvar pain may be a type of nerve pain (neuropathic pain). ?What increases the risk? ?You are more likely to develop this condition if you have: ?Pelvic floor dysfunction. This is a problem in the muscles of your pelvis. ?A disorder that affects a protein that is involved in inflammation in the body. ?An increased number of pain receptor nerves throughout the body. ?A painful bladder. ?Irritable bowel syndrome (IBS). ?Fibromyalgia. This is a condition that causes chronic pain in many parts of the body. ?Restless sleep. ?Depression. ?What are the signs or symptoms? ?The main symptom of this condition is pain that affects part of the vulva or the entire vulva. Pain may: ?Feel like a burning, tearing, stinging, or sharp, knife-like sensation. ?Be long-term and ongoing. ?Come and go (be intermittent). ?Get worse when touching or putting pressure on the vulva, such as when inserting a tampon, having sex, sitting for a long time, or wearing tight pants. ?Symptoms may also include: ?Swelling or redness of the vulva, perineum, or inner thighs. ?Psychological or emotional distress. ?How is this diagnosed? ?This condition may be diagnosed based on: ?Your symptoms and your medical history. Your health care provider  may ask questions about what causes or worsens your pain. ?A physical exam of your abdomen and pelvis. This may include a cotton swab test. During this test, your health care provider gently touches all areas of your vulva with a cotton swab to determine where you have pain. ?Tests of your vaginal fluid. These tests may be done to check for infection. ?How is this treated? ?Treatment for this condition depends on the cause and severity of your pain. Treatment may include: ?Caring for your skin in the vulvar area. ?Working with a health care provider who specializes in pain. ?Physical therapy. This may include doing pelvic floor exercises. ?Counseling (psychotherapy). This may include biofeedback. This is a type of therapy that helps you be more aware of your body's response to pain. It also helps you learn to deal with pain. ?Complementary medical therapies to help relieve pain, such as: ?Hypnotherapy. This means being put in a state of altered consciousness (hypnosis). ?Acupuncture. This is the insertion of needles into certain places on the skin. ?Surgery to remove affected parts of your vulva. ?Follow these instructions at home: ?Clothing ?Wear cotton underwear. ?Avoid wearing tight pants or pantyhose. ?Wash clothing in unscented, mild laundry detergent. Do not use fabric softeners or dryer sheets. ?Eating and drinking ?Avoid caffeine. ?Avoid foods that are highly processed or high in sugar. ?Self-care ?If any soaps, lotions, creams, or ointments cause or worsen your pain, do not use these products on the affected areas. ?Do not use feminine wipes or douches. ?Do not use scented body soaps, scented pads, or scented tampons. ?Do not use hot tubs or take hot baths. ?Clean your  vulvar skin with warm water only and pat the area dry. ?General instructions ?Take over-the-counter and prescription medicines only as told by your health care provider. ?If you have sex, consider using an unscented silicone-based or  oil-based lubricant. ?Keep all follow-up visits. This is important. ?Contact a health care provider if: ?Your pain gets worse or does not improve after starting treatment. ?You develop vaginal discharge that smells bad. ?Summary ?Vulvodynia is long-lasting (chronic) pain in the external female genitalia (vulva). This condition may also be referred to as vulvar pain. The vulva includes tissues surrounding the vaginal opening and the urethra. ?Symptoms of this condition may include swelling or redness of the vulva, perineum, or inner thighs. ?The cause of this condition is often not known. Many factors may contribute to it. Vulvar pain may be a type of nerve pain (neuropathic pain). ?Treatment depends on the cause and severity of your pain. It may include physical therapy, counseling, complementary medical therapies, or working with a pain specialist. Treatment may also include surgery to remove affected parts of your vulva. ?This information is not intended to replace advice given to you by your health care provider. Make sure you discuss any questions you have with your health care provider. ?Document Revised: 02/01/2020 Document Reviewed: 02/01/2020 ?Elsevier Patient Education ? [place]. ? ?

## 2021-10-24 NOTE — Telephone Encounter (Signed)
Patient called in last week, wanting a nurse to return a call.  ?

## 2021-10-25 ENCOUNTER — Other Ambulatory Visit: Payer: Self-pay | Admitting: Family Medicine

## 2021-10-25 LAB — CERVICOVAGINAL ANCILLARY ONLY
Bacterial Vaginitis (gardnerella): NEGATIVE
Candida Glabrata: NEGATIVE
Candida Vaginitis: NEGATIVE
Comment: NEGATIVE
Comment: NEGATIVE
Comment: NEGATIVE
Comment: NEGATIVE
Trichomonas: NEGATIVE

## 2021-10-25 MED ORDER — VALSARTAN 80 MG PO TABS: 80.0000 mg | ORAL_TABLET | Freq: Every day | ORAL | 0 refills | Status: DC

## 2021-10-28 ENCOUNTER — Encounter: Payer: Self-pay | Admitting: Family Medicine

## 2021-11-13 ENCOUNTER — Ambulatory Visit: Payer: Medicare Other | Admitting: Neurology

## 2021-11-13 ENCOUNTER — Encounter: Payer: Self-pay | Admitting: Obstetrics and Gynecology

## 2021-11-13 ENCOUNTER — Encounter: Payer: Self-pay | Admitting: Neurology

## 2021-11-14 ENCOUNTER — Other Ambulatory Visit: Payer: Self-pay

## 2021-11-14 MED ORDER — NONFORMULARY OR COMPOUNDED ITEM: 1 refills | Status: DC

## 2021-11-22 ENCOUNTER — Ambulatory Visit: Payer: Medicare Other

## 2021-11-22 VITALS — Ht 62.75 in

## 2021-11-22 DIAGNOSIS — Z0000 Encounter for general adult medical examination without abnormal findings: Secondary | ICD-10-CM

## 2021-11-22 NOTE — Progress Notes (Signed)
? ?Subjective:  ? Brenda [name] is a 76 y.o. female who presents for Medicare Annual (Subsequent) preventive examination. ? ?Review of Systems    ? ?  ? ?   ?Objective:  ?  ?There were no vitals filed for this visit. ?There is no height or weight on file to calculate BMI. ? ? ?  09/28/2021  ?  2:53 PM 05/24/2018  ? 11:34 AM 05/06/2017  ?  8:28 PM 05/05/2017  ?  2:14 PM 04/10/2015  ?  2:54 PM 04/10/2015  ? 10:43 AM 04/06/2015  ?  3:10 PM  ?Advanced Directives  ?Does Patient Have a Medical Advance Directive? No Yes Yes Yes Yes Yes Yes  ?Type of Advance Directive   Living will  [place];Living will [place];Living will [place];Living will  ?Does patient want to make changes to medical advance directive?     No - Patient declined No - Patient declined No - Patient declined  ?Copy of [place] in Chart?     No - copy requested No - copy requested No - copy requested  ?Would patient like information on creating a medical advance directive? No - Patient declined        ? ? ?Current Medications (verified) ?Outpatient Encounter Medications as of 11/22/2021  ?Medication Sig  ? aspirin EC 81 MG tablet Take 81 mg by mouth daily. Swallow whole.  ? estradiol (ESTRACE) 0.1 MG/GM vaginal cream Use 1/2 g vaginally and to vulva three times per week as needed to maintain symptom relief.  ? lidocaine (XYLOCAINE) 5 % ointment Apply 1 application topically 3 (three) times daily.  ? NONFORMULARY OR COMPOUNDED ITEM Gabapentin 6% cream in neutral base.  Apply to vulva tid. Disp:  60 grams, RF:  one.  ? omeprazole (PRILOSEC) 20 MG capsule Take 20 mg by mouth daily.  ? valsartan (DIOVAN) 80 MG tablet Take 1 tablet (80 mg total) by mouth daily.  ? ?No facility-administered encounter medications on file as of 11/22/2021.  ? ? ?Allergies (verified) ?Codeine, Dexilant [dexlansoprazole], Nitrofurantoin, Other, Ranitidine, Septra [sulfamethoxazole-trimethoprim], and  Sulfamethoxazole-trimethoprim  ? ?History: ?Past Medical History:  ?Diagnosis Date  ? Arthritis   ? Bronchitis   ? hx of when smoked   ? [facility]) 04/16/09  ? kidney cancer - tx by [facility] per her report released from f/u  ? Cataracts, bilateral   ? Diverticulitis 11/2004  ? Endometriosis 1994  ? GERD (gastroesophageal reflux disease)   ? hx hiatal hernia, hx esophageal stricture s/p dilation  ? Headache   ? Hepatitis   ? History of chicken pox   ? History of hiatal hernia   ? History of measles   ? History of mumps as a child   ? Hx of hepatitis   ? with cirrhosis - per her report from [place] and eval in 2013 at [facility]  ? Hyperlipidemia   ? Left ear pain   ? Migraines   ? pt states vesticular migraines has dizziness in relation   ? Numbness and tingling   ? feet bilat has had for 40 years  ? Right knee pain   ? Scarlet fever   ? Shingles   ? Urinary tract bacterial infections   ? hx of  ? Vertigo   ? associated with headache, intermittent, chronic  ? ?Past Surgical History:  ?Procedure Laterality Date  ? ABDOMINAL HYSTERECTOMY  1994  ? TAH/BSO due to endometriosis  ?  APPENDECTOMY    ? CATARACT EXTRACTION W/ INTRAOCULAR LENS IMPLANT Bilateral 05/2015, 04/2015  ? DIAGNOSTIC LAPAROSCOPY    ? LAPAROSCOPIC PARTIAL COLECTOMY N/A 04/10/2015  ? Procedure: LAPAROSCOPIC PARTIAL CECTOMY;  Surgeon: Ralene Ok, MD;  Location: [facility];  Service: General;  Laterality: N/A;  ? [place]  ? Negative  ? NEPHRECTOMY  04/16/09  ? Partial removal due to cancer  ? OOPHORECTOMY    ? TONSILLECTOMY    ? TONSILLECTOMY    ? ?Family History  ?Problem Relation Age of Onset  ? Cancer Father   ? Thyroid cancer Mother   ? Cancer Mother   ?     thyroid/ renal cell ca   ? Colon cancer Neg Hx   ? Breast cancer Neg Hx   ? ?Social History  ? ?Socioeconomic History  ? Marital status: Married  ?  Spouse name: Not on file  ? Number of children: 0  ? Years of education: Not on file  ? Highest education level: Not on file   ?Occupational History  ? Occupation: Retired  ?  Employer: RETIRED  ?Tobacco Use  ? Smoking status: Former  ?  Packs/day: 1.50  ?  Years: 20.00  ?  Pack years: 30.00  ?  Types: Cigarettes  ?  Quit date: 08/25/1990  ?  Years since quitting: 31.2  ? Smokeless tobacco: Never  ?Vaping Use  ? Vaping Use: Never used  ?Substance and Sexual Activity  ? Alcohol use: No  ?  Comment: Rare  ? Drug use: No  ? Sexual activity: Not Currently  ?  Partners: Male  ?  Birth control/protection: Surgical, Post-menopausal  ?  Comment: TAH  ?Other Topics Concern  ? Not on file  ?Social History Narrative  ? Not on file  ? ?Social Determinants of Health  ? ?Financial Resource Strain: Low Risk   ? Difficulty of Paying Living Expenses: Not hard at all  ?Food Insecurity: No Food Insecurity  ? Worried About Charity fundraiser in the Last Year: Never true  ? Ran Out of Food in the Last Year: Never true  ?Transportation Needs: No Transportation Needs  ? Lack of Transportation (Medical): No  ? Lack of Transportation (Non-Medical): No  ?Physical Activity: Inactive  ? Days of Exercise per Week: 0 days  ? Minutes of Exercise per Session: 0 min  ?Stress: No Stress Concern Present  ? Feeling of Stress : Not at all  ?Social Connections: Moderately Integrated  ? Frequency of Communication with Friends and Family: More than three times a week  ? Frequency of Social Gatherings with Friends and Family: Once a week  ? Attends Religious Services: Never  ? Active Member of Clubs or Organizations: Yes  ? Attends Archivist Meetings: Never  ? Marital Status: Married  ? ? ?Tobacco Counseling ?Counseling given: Not Answered ? ? ?Clinical Intake: ? ?  ? ?  ? ?  ? ?  ? ?How often do you need to have someone help you when you read instructions, pamphlets, or other written materials from your doctor or pharmacy?: (P) 3 - Sometimes ? ?Diabetic?No ? ?  ? ?  ? ? ?Activities of Daily Living ? ?  11/18/2021  ? 12:11 PM 06/11/2021  ?  2:02 PM  ?In your present  state of health, do you have any difficulty performing the following activities:  ?Hearing? 0 0  ?Vision? 0 0  ?Difficulty concentrating or making decisions? 1 0  ?Walking  or climbing stairs? 1 0  ?Dressing or bathing? 1 0  ?Doing errands, shopping? 1 1  ?Preparing Food and eating ? N   ?Using the Toilet? N   ?In the past six months, have you accidently leaked urine? Y   ?Do you have problems with loss of bowel control? N   ?Managing your Medications? Y   ?Managing your Finances? N   ?Housekeeping or managing your Housekeeping? N   ? ? ?Patient Care Team: ?Dorna Mai, MD as PCP - General (Family Medicine) ?Arta Silence, MD as Consulting Physician (Gastroenterology) ?Pieter Partridge, DO as Consulting Physician (Neurology) ?Romine, Lubertha South, MD as Consulting Physician (Obstetrics and Gynecology) ?Rutherford Guys, MD as Consulting Physician (Ophthalmology) ?Lynda Rainwater, DDS as Consulting Physician (Dentistry) ? ?Indicate any recent Medical Services you may have received from other than [facility] providers in the past year (date may be approximate). ? ?   ?Assessment:  ? This is a routine wellness examination for Maddyx. ? ?Hearing/Vision screen ?No results found. ? ?Dietary issues and exercise activities discussed: ?  ? ? Goals Addressed   ? ?  ?  ?  ?  ? This Visit's Progress  ?  Protect My Health     ?  The patient states she wants to work on staying healthy ?  ? ?  ? ?Depression Screen ? ?  11/22/2021  ?  1:04 PM 06/11/2021  ?  2:04 PM 09/20/2019  ? 10:35 AM 05/24/2018  ? 11:37 AM 10/21/2016  ?  9:18 AM 03/26/2015  ?  9:48 AM 12/08/2014  ? 11:21 AM  ?PHQ 2/9 Scores  ?PHQ - 2 Score 0 1 0 0 0 0 0  ?  ?Fall Risk ? ?  11/18/2021  ? 12:11 PM 06/11/2021  ?  2:03 PM 09/20/2019  ? 10:35 AM 05/24/2018  ? 11:37 AM 03/26/2018  ? 10:25 AM  ?Fall Risk   ?Falls in the past year? 1 0 0 No No  ?Comment     [facility] Telephone Survey: data to providers prior to load  ?Number falls in past yr: 0 0     ?Injury with Fall? 1 0     ? ? ?FALL RISK  PREVENTION PERTAINING TO THE HOME: ? ?Any stairs in or around the home? Yes  ?If so, are there any without handrails? Yes  ?Home free of loose throw rugs in walkways, pet beds, electrical cords, etc? Yes  ?Adequate lighting in yo

## 2021-12-03 NOTE — Progress Notes (Signed)
GYNECOLOGY  VISIT ?  ?HPI: ?76 y.o.   Married  Caucasian  female   ?[id number] with Patient's last menstrual period was 08/25/1992.   ?here for 6 week follow up.  Patient still complaining of vaginal dryness and vaginal pain. She states Gabapentin helps more than Estrace cream.   ?Her husband is present for the visit today. ? ?She uses Gabapentin cream externally to vulva at least once a day. ?It takes away all discomfort immediately.  ?No burning or irritation when she uses this.  ? ?She states the vaginal estrogen is not helping.  ?She is using a full tube three times a week.  ?The vagina feels uncomfortable.  ? ?Lidocaine externally did not help.  ? ?She had negative vaginitis testing on 10/24/21.  ? ?Denies depression, anxiety, or worry.  ? ?GYNECOLOGIC HISTORY: ?Patient's last menstrual period was 08/25/1992. ?Contraception:  Hyst/BSO ?Menopausal hormone therapy:  Estrace cream ?Last mammogram:  08-12-21 Neg/[facility] ?Last pap smear:   2009 Neg ?       ?OB History   ? ? Gravida  ?0  ? Para  ?0  ? Term  ?0  ? Preterm  ?0  ? AB  ?0  ? Living  ?0  ?  ? ? SAB  ?0  ? IAB  ?0  ? Ectopic  ?0  ? Multiple  ?0  ? Live Births  ?0  ?   ?  ?  ?    ? ?Patient Active Problem List  ? Diagnosis Date Noted  ? Left sided abdominal pain 06/27/2021  ? Hyperlipidemia 11/22/2019  ? S/P partial resection of colon 04/10/2015  ? Cirrhosis ([place]) 12/08/2014  ? Esophageal reflux 12/08/2014  ? Esophageal stricture 12/08/2014  ? Vertigo 12/08/2014  ? ? ?Past Medical History:  ?Diagnosis Date  ? Arthritis   ? Bronchitis   ? hx of when smoked   ? Cancer [facility]) 04/16/09  ? kidney cancer - [facility] per her report released from f/u  ? Cataracts, bilateral   ? Diverticulitis 11/2004  ? Endometriosis 1994  ? GERD (gastroesophageal reflux disease)   ? hx hiatal hernia, hx esophageal stricture s/p dilation  ? Headache   ? Hepatitis   ? History of chicken pox   ? History of hiatal hernia   ? History of measles   ? History of mumps as a child   ?  Hx of hepatitis   ? with cirrhosis - per her report from [place] and eval in 2013 at [facility]  ? Hyperlipidemia   ? Left ear pain   ? Migraines   ? pt states vesticular migraines has dizziness in relation   ? Numbness and tingling   ? feet bilat has had for 40 years  ? Right knee pain   ? Scarlet fever   ? Shingles   ? Urinary tract bacterial infections   ? hx of  ? Vertigo   ? associated with headache, intermittent, chronic  ? ? ?Past Surgical History:  ?Procedure Laterality Date  ? ABDOMINAL HYSTERECTOMY  1994  ? TAH/BSO due to endometriosis  ? APPENDECTOMY    ? CATARACT EXTRACTION W/ INTRAOCULAR LENS IMPLANT Bilateral 05/2015, 04/2015  ? DIAGNOSTIC LAPAROSCOPY    ? LAPAROSCOPIC PARTIAL COLECTOMY N/A 04/10/2015  ? Procedure: LAPAROSCOPIC PARTIAL CECTOMY;  Surgeon: Ralene Ok, MD;  Location: [facility];  Service: General;  Laterality: N/A;  ? [place]  ? Negative  ? NEPHRECTOMY  04/16/09  ? Partial removal  due to cancer  ? OOPHORECTOMY    ? TONSILLECTOMY    ? TONSILLECTOMY    ? ? ?Current Outpatient Medications  ?Medication Sig Dispense Refill  ? aspirin EC 81 MG tablet Take 81 mg by mouth daily. Swallow whole.    ? estradiol (ESTRACE) 0.1 MG/GM vaginal cream Use 1/2 g vaginally and to vulva three times per week as needed to maintain symptom relief. 42.5 g 1  ? lidocaine (XYLOCAINE) 5 % ointment Apply 1 application topically 3 (three) times daily. 1.25 g 1  ? NONFORMULARY OR COMPOUNDED ITEM Gabapentin 6% cream in neutral base.  Apply to vulva tid. Disp:  60 grams, RF:  one. 60 each 1  ? omeprazole (PRILOSEC) 20 MG capsule Take 20 mg by mouth daily.    ? valsartan (DIOVAN) 80 MG tablet Take 1 tablet (80 mg total) by mouth daily. 30 tablet 0  ? ?No current facility-administered medications for this visit.  ?  ? ?ALLERGIES: Codeine, Dexilant [dexlansoprazole], Nitrofurantoin, Other, Ranitidine, Septra [sulfamethoxazole-trimethoprim], and Sulfamethoxazole-trimethoprim ? ?Family History  ?Problem Relation Age of  Onset  ? Cancer Father   ? Thyroid cancer Mother   ? Cancer Mother   ?     thyroid/ renal cell ca   ? Colon cancer Neg Hx   ? Breast cancer Neg Hx   ? ? ?Social History  ? ?Socioeconomic History  ? Marital status: Married  ?  Spouse name: Not on file  ? Number of children: 0  ? Years of education: Not on file  ? Highest education level: Not on file  ?Occupational History  ? Occupation: Retired  ?  Employer: RETIRED  ?Tobacco Use  ? Smoking status: Former  ?  Packs/day: 1.50  ?  Years: 20.00  ?  Pack years: 30.00  ?  Types: Cigarettes  ?  Quit date: 08/25/1990  ?  Years since quitting: 31.2  ? Smokeless tobacco: Never  ?Vaping Use  ? Vaping Use: Never used  ?Substance and Sexual Activity  ? Alcohol use: No  ?  Comment: Rare  ? Drug use: No  ? Sexual activity: Not Currently  ?  Partners: Male  ?  Birth control/protection: Surgical, Post-menopausal  ?  Comment: TAH  ?Other Topics Concern  ? Not on file  ?Social History Narrative  ? Not on file  ? ?Social Determinants of Health  ? ?Financial Resource Strain: Low Risk   ? Difficulty of Paying Living Expenses: Not hard at all  ?Food Insecurity: No Food Insecurity  ? Worried About Charity fundraiser in the Last Year: Never true  ? Ran Out of Food in the Last Year: Never true  ?Transportation Needs: No Transportation Needs  ? Lack of Transportation (Medical): No  ? Lack of Transportation (Non-Medical): No  ?Physical Activity: Inactive  ? Days of Exercise per Week: 0 days  ? Minutes of Exercise per Session: 0 min  ?Stress: No Stress Concern Present  ? Feeling of Stress : Not at all  ?Social Connections: Moderately Integrated  ? Frequency of Communication with Friends and Family: More than three times a week  ? Frequency of Social Gatherings with Friends and Family: Once a week  ? Attends Religious Services: Never  ? Active Member of Clubs or Organizations: Yes  ? Attends Archivist Meetings: Never  ? Marital Status: Married  ?Intimate Partner Violence: Not At Risk   ? Fear of Current or Ex-Partner: No  ? Emotionally Abused: No  ? Physically  Abused: No  ? Sexually Abused: No  ? ? ?Review of Systems  ?Genitourinary:  Positive for vaginal pain.  ?All other systems reviewed and are negative. ? ?PHYSICAL EXAMINATION:   ? ?BP (!) 144/72   Ht 5' 2.75" (1.594 m)   Wt 147 lb (66.7 kg)   LMP 08/25/1992   BMI 26.25 kg/m?     ?General appearance: alert, cooperative and appears stated age ? ?ASSESSMENT ? ?Vulvar and vaginal pain.  ?Vulvodynia.  ? ?PLAN ? ?Refill of vaginal estrogen cream 1 gm three times weekly. I educated her that she is using too much vaginal estrogen cream.  ?Refill of gabapentin 6% cream tid prn.  To [facility]. ?Start Cymbalta 20 mg daily.  I discussed risks and benefits.  ?She declined oral Gabapentin and pelvic floor therapy at this time.  ?FU in 6 weeks.  ?  ?An After Visit Summary was printed and given to the patient. ? ?35 min  total time was spent for this patient encounter, including preparation, face-to-face counseling with the patient, coordination of care, and documentation of the encounter. ? ? ?

## 2021-12-04 ENCOUNTER — Ambulatory Visit (INDEPENDENT_AMBULATORY_CARE_PROVIDER_SITE_OTHER): Payer: Medicare Other | Admitting: Obstetrics and Gynecology

## 2021-12-04 ENCOUNTER — Encounter: Payer: Self-pay | Admitting: Obstetrics and Gynecology

## 2021-12-04 ENCOUNTER — Encounter: Payer: Self-pay | Admitting: Family Medicine

## 2021-12-04 VITALS — BP 144/72 | Ht 62.75 in | Wt 147.0 lb

## 2021-12-04 DIAGNOSIS — R102 Pelvic and perineal pain: Secondary | ICD-10-CM

## 2021-12-04 MED ORDER — DULOXETINE HCL 20 MG PO CPEP: 20.0000 mg | ORAL_CAPSULE | Freq: Every day | ORAL | 1 refills | Status: DC

## 2021-12-04 MED ORDER — NONFORMULARY OR COMPOUNDED ITEM: 1 refills | Status: DC

## 2021-12-04 MED ORDER — ESTRADIOL 0.1 MG/GM VA CREA: TOPICAL_CREAM | VAGINAL | 1 refills | Status: DC

## 2021-12-04 NOTE — Patient Instructions (Signed)
Duloxetine Delayed-Release Capsules ?What is this medication? ?DULOXETINE (doo LOX e teen) treats depression, anxiety, fibromyalgia, and certain types of chronic pain such as nerve, bone, or joint pain. It increases the amount of serotonin and norepinephrine in the brain, hormones that help regulate mood and pain. It belongs to a group of medications called SNRIs. ?This medicine may be used for other purposes; ask your health care provider or pharmacist if you have questions. ?COMMON BRAND NAME(S): Cymbalta, Drizalma, Irenka ?What should I tell my care team before I take this medication? ?They need to know if you have any of these conditions: ?Bipolar disorder ?Glaucoma ?High blood pressure ?Kidney disease ?Liver disease ?Seizures ?Suicidal thoughts, plans or attempt; a previous suicide attempt by you or a family member ?Take medications that treat or prevent blood clots ?Taken medications called MAOIs like Carbex, Eldepryl, Marplan, Nardil, and Parnate within 14 days ?Trouble passing urine ?An unusual reaction to duloxetine, other medications, foods, dyes, or preservatives ?Pregnant or trying to get pregnant ?Breast-feeding ?How should I use this medication? ?Take this medication by mouth with a glass of water. Follow the directions on the prescription label. Do not crush, cut or chew some capsules of this medication. Some capsules may be opened and sprinkled on applesauce. Check with your care team or pharmacist if you are not sure. You can take this medication with or without food. Take your medication at regular intervals. Do not take your medication more often than directed. Do not stop taking this medication suddenly except upon the advice of your care team. Stopping this medication too quickly may cause serious side effects or your condition may worsen. ?A special MedGuide will be given to you by the pharmacist with each prescription and refill. Be sure to read this information carefully each time. ?Talk to  your care team regarding the use of this medication in children. While this medication may be prescribed for children as young as 49 years of age for selected conditions, precautions do apply. ?Overdosage: If you think you have taken too much of this medicine contact a poison control center or emergency room at once. ?NOTE: This medicine is only for you. Do not share this medicine with others. ?What if I miss a dose? ?If you miss a dose, take it as soon as you can. If it is almost time for your next dose, take only that dose. Do not take double or extra doses. ?What may interact with this medication? ?Do not take this medication with any of the following: ?Desvenlafaxine ?Levomilnacipran ?Linezolid ?MAOIs like Carbex, Eldepryl, [place], Marplan, Nardil, and Parnate ?Methylene blue (injected into a vein) ?Milnacipran ?Safinamide ?Thioridazine ?Venlafaxine ?Viloxazine ?This medication may also interact with the following: ?Alcohol ?Amphetamines ?Aspirin and aspirin-like medications ?Certain antibiotics like ciprofloxacin and enoxacin ?Certain medications for blood pressure, heart disease, irregular heart beat ?Certain medications for depression, anxiety, or psychotic disturbances ?Certain medications for migraine headache like almotriptan, eletriptan, frovatriptan, naratriptan, rizatriptan, sumatriptan, zolmitriptan ?Certain medications that treat or prevent blood clots like warfarin, enoxaparin, and dalteparin ?Cimetidine ?Fentanyl ?Lithium ?NSAIDS, medications for pain and inflammation, like ibuprofen or naproxen ?Phentermine ?Procarbazine ?Rasagiline ?Sibutramine ?St. John's wort ?Theophylline ?Tramadol ?Tryptophan ?This list may not describe all possible interactions. Give your health care provider a list of all the medicines, herbs, non-prescription drugs, or dietary supplements you use. Also tell them if you smoke, drink alcohol, or use illegal drugs. Some items may interact with your medicine. ?What should I watch  for while using this medication? ?Tell your  care team if your symptoms do not get better or if they get worse. Visit your care team for regular checks on your progress. Because it may take several weeks to see the full effects of this medication, it is important to continue your treatment as prescribed by your care team. ?This medication may cause serious skin reactions. They can happen weeks to months after starting the medication. Contact your care team right away if you notice fevers or flu-like symptoms with a rash. The rash may be red or purple and then turn into blisters or peeling of the skin. Or, you might notice a red rash with swelling of the face, lips, or lymph nodes in your neck or under your arms. ?Watch for new or worsening thoughts of suicide or depression. This includes sudden changes in mood, behaviors, or thoughts. These changes can happen at any time but are more common in the beginning of treatment or after a change in dose. Call your care team right away if you experience these thoughts or worsening depression. ?Manic episodes may happen in patients with bipolar disorder who take this medication. Watch for changes in feelings or behaviors such as feeling anxious, nervous, agitated, panicky, irritable, hostile, aggressive, impulsive, severely restless, overly excited and hyperactive, or trouble sleeping. These symptoms can happen at any time, but are more common in the beginning of treatment or after a change in dose. Call your care team right away if you notice any of these symptoms. ?You may get drowsy or dizzy. Do not drive, use machinery, or do anything that needs mental alertness until you know how this medication affects you. Do not stand or sit up quickly, especially if you are an older patient. This reduces the risk of dizzy or fainting spells. Alcohol may interfere with the effect of this medication. Avoid alcoholic drinks. ?This medication may increase blood sugar. The risk may be  higher in patients who already have diabetes. Ask your care team what you can do to lower your risk of diabetes while taking this medication. ?This medication can cause an increase in blood pressure. This medication can also cause a sudden drop in your blood pressure, which may make you feel faint and increase the chance of a fall. These effects are most common when you first start the medication or when the dose is increased, or during use of other medications that can cause a sudden drop in blood pressure. Check with your care team for instructions on monitoring your blood pressure while taking this medication. ?Your mouth may get dry. Chewing sugarless gum or sucking hard candy, and drinking plenty of water, may help. Contact your care team if the problem does not go away or is severe. ?What side effects may I notice from receiving this medication? ?Side effects that you should report to your care team as soon as possible: ?Allergic reactions--skin rash, itching, hives, swelling of the face, lips, tongue, or throat ?Bleeding--bloody or black, tar-like stools, red or dark brown urine, vomiting blood or brown material that looks like coffee grounds, small, red or purple spots on skin, unusual bleeding or bruising ?Increase in blood pressure ?Liver injury--right upper belly pain, loss of appetite, nausea, light-colored stool, dark yellow or brown urine, yellowing skin or eyes, unusual weakness or fatigue ?Low sodium level--muscle weakness, fatigue, dizziness, headache, confusion ?Redness, blistering, peeling, or loosening of the skin, including inside the mouth ?Serotonin syndrome--irritability, confusion, fast or irregular heartbeat, muscle stiffness, twitching muscles, sweating, high fever, seizures, chills, vomiting, diarrhea ?  Sudden eye pain or change in vision such as blurry vision, seeing halos around lights, vision loss ?Thoughts of suicide or self-harm, worsening mood, feelings of depression ?Trouble passing  urine ?Side effects that usually do not require medical attention (report to your care team if they continue or are bothersome): ?Change in sex drive or performance ?Constipation ?Diarrhea ?Dizziness ?Dr

## 2021-12-05 ENCOUNTER — Encounter: Payer: Self-pay | Admitting: Obstetrics and Gynecology

## 2021-12-11 NOTE — Telephone Encounter (Signed)
-----   Message from Dorna Mai, MD sent at 12/10/2021 11:42 AM EDT ----- ?This patient has not been seen for 6 months and wants refills and med changes. I am not willing to do this for a patient her age with her chronic issues without at least some evaluation. Even a home nurse set up if patient is unwilling or able to come to office will at least give me some info. But I don't know why she is asking me if she is finding a new provider like her husband said.  ? ?

## 2021-12-11 NOTE — Progress Notes (Signed)
Patient was called today to advised she would need an appt to see Dr. Redmond Pulling for medication refill.I advised patient that she has not been seen over 6 months and we need to check her BP,labs, and medication. Patient said that she would need to talk with her husband about having an appointment. I did ask to speak with husband , Mrs. Ann said she get back with [place]. ?Melene Plan ? ?

## 2021-12-12 NOTE — Telephone Encounter (Signed)
Call placed to patient.  ?No taxi voucher given. ?Questions answered.  ?Patient will contact insurance plan to review transportation benefits. Is aware to contact  office if any additional questions.  ? ?Encounter closed.  ?

## 2021-12-18 ENCOUNTER — Encounter: Payer: Self-pay | Admitting: Neurology

## 2021-12-23 ENCOUNTER — Encounter: Payer: Self-pay | Admitting: Obstetrics and Gynecology

## 2021-12-23 ENCOUNTER — Telehealth: Payer: Self-pay | Admitting: Neurology

## 2021-12-23 ENCOUNTER — Encounter: Payer: Self-pay | Admitting: Neurology

## 2021-12-23 ENCOUNTER — Ambulatory Visit (INDEPENDENT_AMBULATORY_CARE_PROVIDER_SITE_OTHER): Payer: Medicare Other | Admitting: Neurology

## 2021-12-23 VITALS — BP 155/81 | HR 77 | Ht 63.0 in | Wt 151.0 lb

## 2021-12-23 DIAGNOSIS — G459 Transient cerebral ischemic attack, unspecified: Secondary | ICD-10-CM

## 2021-12-23 DIAGNOSIS — R7309 Other abnormal glucose: Secondary | ICD-10-CM | POA: Diagnosis not present

## 2021-12-23 DIAGNOSIS — R419 Unspecified symptoms and signs involving cognitive functions and awareness: Secondary | ICD-10-CM

## 2021-12-23 DIAGNOSIS — R42 Dizziness and giddiness: Secondary | ICD-10-CM | POA: Diagnosis not present

## 2021-12-23 DIAGNOSIS — R569 Unspecified convulsions: Secondary | ICD-10-CM

## 2021-12-23 NOTE — Telephone Encounter (Signed)
sent to [facility], they will reach out to the patient to schedule. ?

## 2021-12-23 NOTE — Progress Notes (Signed)
?[facility] ? ? ? ?Provider:  Dr Jaynee Eagles ?Requesting Provider: Terrill Mohr* ?Primary Care Provider:  Dorna Mai, MD ? ?CC: left sided weakness.  ? ?HPI:  Brenda [name] is a 76 y.o. female here as requested by Terrill Mohr* for left-sided weakness.  Past medical history diverticulitis, endometriosis, cancer GERD.  I reviewed her emergency room notes from February, she had several days of left-sided lower extremity weakness, at 9 AM that day she was walking at her home when her leg suddenly gave out causing her to fall to the floor and hit her head, husband came to her aid, no blood thinners, she was making nonsensical noises for 2 to 3 minutes, resolved on their own until EMS came, still reporting left-sided extremity weakness, he was like left leg is tingling the ED. neurologic exam was unremarkable and strength was 5 out of 5 in the emergency room.  She was still complaining of tingling of the leg in the emergency room.  I reviewed labs which showed slight hyponatremia at 131 otherwise unremarkable.  She was discharged from the emergency room with follow-up outpatient. ? ?Husband says this is NOT the case. This was February 4th, husband here and provides information, she started to walk to the sun porch and there is a step down and when she got there she all of a suddenly her arms started flapping, both arms, no alteration of awareness, she could not make her arms stop. Falpping up an down, thre her on the floor, she was across not left sided. It ws bilateral, she fell, unclear why she fell, possibly weakness, when husband found her she was flapping both legs and arms off the ground she was not lucide for several minutes but not unconscious, he said something to her and she and she said something unintelligible, the flapping went on for 5 minutes, but before that time she became that time she became more lucid and was able to speak but still likely altered. She  remembrs being on the ground. No shortness of breath, no palpitations, unclear if weakness, no hx of seizures, the paramedics cme and when they came he looked down and said "stroke", she has had chronic dizziness and saw [place]. No back painn, no neck pain, some tingling the feet on the bottom stable for several years. No other focal neurologic deficits, associated symptoms, inciting events or modifiable factors. ? ? ?Reviewed notes, labs and imaging from outside physicians, which showed:  ? ?CT head 09/2021: IMPRESSION: Personally reviewed CT of the head and agree with the above. ?1. No acute intracranial abnormality. ?2. Chronic microvascular disease and brain atrophy. ?3. No evidence for acute cervical spine fracture or subluxation. ?4. Cervical degenerative disc disease. ?  ?01/06/2017: MRi brain [facility]: FINDINGS:  ?Brain Parenchyma:  No hemorrhage, cerebral edema, acute cortical  ?infarction, mass, mass effect, or midline shift.  No abnormal enhancement.  ?Minimal periventricular and subcortical T2/FLAIR hyperintense foci  ?consistent with mild chronic small vessel ischemic changes.  ?Ventricles and Sulci: Normal for age.    ?Extra-Axial Spaces: No extra-axial fluid collection.  ?Basal Cisterns: Normal.  ?Intracranial Flow-Voids: Normal.  ? ?Paranasal Sinuses: Normal.  ?Mastoid Sinuses: Normal.  ?Orbits: Normal.  ?Cranium: Normal.  ? ?IMPRESSION:  ?No acute intracranial abnormalities. No etiology for ataxia identified. ? ?Review of Systems: ?Patient complains of symptoms per HPI as well as the following symptoms left-sided weakness. Pertinent negatives and positives per HPI. All others negative. ? ? ?Social  History  ? ?Socioeconomic History  ? Marital status: Married  ?  Spouse name: Not on file  ? Number of children: 0  ? Years of education: Not on file  ? Highest education level: Not on file  ?Occupational History  ? Occupation: Retired  ?  Employer: RETIRED  ?Tobacco Use  ? Smoking status: Former  ?   Packs/day: 1.50  ?  Years: 20.00  ?  Pack years: 30.00  ?  Types: Cigarettes  ?  Quit date: 08/25/1990  ?  Years since quitting: 31.3  ? Smokeless tobacco: Never  ?Vaping Use  ? Vaping Use: Never used  ?Substance and Sexual Activity  ? Alcohol use: No  ?  Comment: Rare  ? Drug use: No  ? Sexual activity: Not Currently  ?  Partners: Male  ?  Birth control/protection: Surgical, Post-menopausal  ?  Comment: TAH  ?Other Topics Concern  ? Not on file  ?Social History Narrative  ? Not on file  ? ?Social Determinants of Health  ? ?Financial Resource Strain: Low Risk   ? Difficulty of Paying Living Expenses: Not hard at all  ?Food Insecurity: No Food Insecurity  ? Worried About Charity fundraiser in the Last Year: Never true  ? Ran Out of Food in the Last Year: Never true  ?Transportation Needs: No Transportation Needs  ? Lack of Transportation (Medical): No  ? Lack of Transportation (Non-Medical): No  ?Physical Activity: Inactive  ? Days of Exercise per Week: 0 days  ? Minutes of Exercise per Session: 0 min  ?Stress: No Stress Concern Present  ? Feeling of Stress : Not at all  ?Social Connections: Moderately Integrated  ? Frequency of Communication with Friends and Family: More than three times a week  ? Frequency of Social Gatherings with Friends and Family: Once a week  ? Attends Religious Services: Never  ? Active Member of Clubs or Organizations: Yes  ? Attends Archivist Meetings: Never  ? Marital Status: Married  ?Intimate Partner Violence: Not At Risk  ? Fear of Current or Ex-Partner: No  ? Emotionally Abused: No  ? Physically Abused: No  ? Sexually Abused: No  ? ? ?Family History  ?Problem Relation Age of Onset  ? Thyroid cancer Mother   ? Cancer Mother   ?     thyroid/ renal cell ca   ? Cancer Father   ? Colon cancer Neg Hx   ? Breast cancer Neg Hx   ? Stroke Neg Hx   ? ? ?Past Medical History:  ?Diagnosis Date  ? Arthritis   ? Bronchitis   ? hx of when smoked   ? Cancer [facility]) 04/16/09  ? kidney cancer -  tx by [facility] per her report released from f/u  ? Cataracts, bilateral   ? Diverticulitis 11/2004  ? Endometriosis 1994  ? GERD (gastroesophageal reflux disease)   ? hx hiatal hernia, hx esophageal stricture s/p dilation  ? Headache   ? Hepatitis   ? History of chicken pox   ? History of hiatal hernia   ? History of measles   ? History of mumps as a child   ? Hx of hepatitis   ? with cirrhosis - per her report from [place] and eval in 2013 at [facility]  ? Hyperlipidemia   ? Left ear pain   ? Migraines   ? pt states vesticular migraines has dizziness in relation   ? Numbness and tingling   ?  feet bilat has had for 40 years  ? Right knee pain   ? Scarlet fever   ? Shingles   ? Urinary tract bacterial infections   ? hx of  ? Vertigo   ? associated with headache, intermittent, chronic  ? ? ?Patient Active Problem List  ? Diagnosis Date Noted  ? Left sided abdominal pain 06/27/2021  ? Hyperlipidemia 11/22/2019  ? S/P partial resection of colon 04/10/2015  ? Cirrhosis ([place]) 12/08/2014  ? Esophageal reflux 12/08/2014  ? Esophageal stricture 12/08/2014  ? Vertigo 12/08/2014  ? ? ?Past Surgical History:  ?Procedure Laterality Date  ? ABDOMINAL HYSTERECTOMY  1994  ? TAH/BSO due to endometriosis  ? APPENDECTOMY    ? CATARACT EXTRACTION W/ INTRAOCULAR LENS IMPLANT Bilateral 05/2015, 04/2015  ? DIAGNOSTIC LAPAROSCOPY    ? LAPAROSCOPIC PARTIAL COLECTOMY N/A 04/10/2015  ? Procedure: LAPAROSCOPIC PARTIAL CECTOMY;  Surgeon: Ralene Ok, MD;  Location: [facility];  Service: General;  Laterality: N/A;  ? [place]  ? Negative  ? NEPHRECTOMY  04/16/09  ? Partial removal due to cancer  ? OOPHORECTOMY    ? TONSILLECTOMY    ? TONSILLECTOMY    ? ? ?Current Outpatient Medications  ?Medication Sig Dispense Refill  ? aspirin EC 81 MG tablet Take 81 mg by mouth daily. Swallow whole.    ? DULoxetine (CYMBALTA) 20 MG capsule Take 1 capsule (20 mg total) by mouth daily. 30 capsule 1  ? estradiol (ESTRACE) 0.1 MG/GM vaginal cream Use 1  g vaginally and to vulva three times per week as needed to maintain symptom relief. 42.5 g 1  ? NONFORMULARY OR COMPOUNDED ITEM Gabapentin 6% cream in neutral base.  Apply to vulva tid. Disp:  60 grams, RF:  o

## 2021-12-23 NOTE — Patient Instructions (Addendum)
MRI of the brain and MRA of the blood vessels of the head ?Carotid dopplers of the neck ?Echocardiogram heart and 2-week patch to monitor heart rate (looking for atrial fibrillation or other risk factors for stroke and alteration of awareness) - Brenda Kaiser, MD ?EEG the brain  ?Continue ASA '81mg'$   ?Blood work ?PATIENT AGREED AND CONSENTED TO WORKUP ?PT vestibular therapy - call [facility]  ? ?Stroke Prevention ?Some medical conditions and behaviors can lead to a higher chance of having a stroke. You can help prevent a stroke by eating healthy, exercising, not smoking, and managing any medical conditions you have. ?Stroke is a leading cause of functional impairment. Primary prevention is particularly important because a majority of strokes are first-time events. Stroke changes the lives of not only those who experience a stroke but also their family and other caregivers. ?How can this condition affect me? ?A stroke is a medical emergency and should be treated right away. A stroke can lead to brain damage and can sometimes be life-threatening. If a person gets medical treatment right away, there is a better chance of surviving and recovering from a stroke. ?What can increase my risk? ?The following medical conditions may increase your risk of a stroke: ?Cardiovascular disease. ?High blood pressure (hypertension). ?Diabetes. ?High cholesterol. ?Sickle cell disease. ?Blood clotting disorders (hypercoagulable state). ?Obesity. ?Sleep disorders (obstructive sleep apnea). ?Other risk factors include: ?Being older than age 55. ?Having a history of blood clots, stroke, or mini-stroke (transient ischemic attack, TIA). ?Genetic factors, such as race, ethnicity, or a family history of stroke. ?Smoking cigarettes or using other tobacco products. ?Taking birth control pills, especially if you also use tobacco. ?Heavy use of alcohol or drugs, especially cocaine and methamphetamine. ?Physical inactivity. ?What actions  can I take to prevent this? ?Manage your health conditions ?High cholesterol levels. ?Eating a healthy diet is important for preventing high cholesterol. If cholesterol cannot be managed through diet alone, you may need to take medicines. ?Take any prescribed medicines to control your cholesterol as told by your health care provider. ?Hypertension. ?To reduce your risk of stroke, try to keep your blood pressure below 130/80. ?Eating a healthy diet and exercising regularly are important for controlling blood pressure. If these steps are not enough to manage your blood pressure, you may need to take medicines. ?Take any prescribed medicines to control hypertension as told by your health care provider. ?Ask your health care provider if you should monitor your blood pressure at home. ?Have your blood pressure checked every year, even if your blood pressure is normal. Blood pressure increases with age and some medical conditions. ?Diabetes. ?Eating a healthy diet and exercising regularly are important parts of managing your blood sugar (glucose). If your blood sugar cannot be managed through diet and exercise, you may need to take medicines. ?Take any prescribed medicines to control your diabetes as told by your health care provider. ?Get evaluated for obstructive sleep apnea. Talk to your health care provider about getting a sleep evaluation if you snore a lot or have excessive sleepiness. ?Make sure that any other medical conditions you have, such as atrial fibrillation or atherosclerosis, are managed. ?Nutrition ?Follow instructions from your health care provider about what to eat or drink to help manage your health condition. These instructions may include: ?Reducing your daily calorie intake. ?Limiting how much salt (sodium) you use to 1,500 milligrams (mg) each day. ?Using only healthy fats for cooking, such as olive oil, canola oil, or sunflower oil. ?Eating  healthy foods. You can do this by: ?Choosing foods that  are high in fiber, such as whole grains, and fresh fruits and vegetables. ?Eating at least 5 servings of fruits and vegetables a day. Try to fill one-half of your plate with fruits and vegetables at each meal. ?Choosing lean protein foods, such as lean cuts of meat, poultry without skin, fish, tofu, beans, and nuts. ?Eating low-fat dairy products. ?Avoiding foods that are high in sodium. This can help lower blood pressure. ?Avoiding foods that have saturated fat, trans fat, and cholesterol. This can help prevent high cholesterol. ?Avoiding processed and prepared foods. ?Counting your daily carbohydrate intake. ? ?Lifestyle ?If you drink alcohol: ?Limit how much you have to: ?0-1 drink a day for women who are not pregnant. ?0-2 drinks a day for men. ?Know how much alcohol is in your drink. In the [place] one drink equals one 12 oz bottle of beer (366m), one 5 oz glass of wine (1477m, or one 1? oz glass of hard liquor (4490m ?Do not use any products that contain nicotine or tobacco. These products include cigarettes, chewing tobacco, and vaping devices, such as e-cigarettes. If you need help quitting, ask your health care provider. ?Avoid secondhand smoke. ?Do not use drugs. ?Activity ? ?Try to stay at a healthy weight. ?Get at least 30 minutes of exercise on most days, such as: ?Fast walking. ?Biking. ?Swimming. ?Medicines ?Take over-the-counter and prescription medicines only as told by your health care provider. Aspirin or blood thinners (antiplatelets or anticoagulants) may be recommended to reduce your risk of forming blood clots that can lead to stroke. ?Avoid taking birth control pills. Talk to your health care provider about the risks of taking birth control pills if: ?You are over 35 47ars old. ?You smoke. ?You get very bad headaches. ?You have had a blood clot. ?Where to find more information ?American Stroke Association: www.strokeassociation.org ?Get help right away if: ?You or a loved one has any  symptoms of a stroke. "BE FAST" is an easy way to remember the main warning signs of a stroke: ?B - Balance. Signs are dizziness, sudden trouble walking, or loss of balance. ?E - Eyes. Signs are trouble seeing or a sudden change in vision. ?F - Face. Signs are sudden weakness or numbness of the face, or the face or eyelid drooping on one side. ?A - Arms. Signs are weakness or numbness in an arm. This happens suddenly and usually on one side of the body. ?S - Speech. Signs are sudden trouble speaking, slurred speech, or trouble understanding what people say. ?T - Time. Time to call emergency services. Write down what time symptoms started. ?You or a loved one has other signs of a stroke, such as: ?A sudden, severe headache with no known cause. ?Nausea or vomiting. ?Seizure. ?These symptoms may represent a serious problem that is an emergency. Do not wait to see if the symptoms will go away. Get medical help right away. Call your local emergency services (911 in the [place] Do not drive yourself to the hospital. ?Summary ?You can help to prevent a stroke by eating healthy, exercising, not smoking, limiting alcohol intake, and managing any medical conditions you may have. ?Do not use any products that contain nicotine or tobacco. These include cigarettes, chewing tobacco, and vaping devices, such as e-cigarettes. If you need help quitting, ask your health care provider. ?Remember "BE FAST" for warning signs of a stroke. Get help right away if you or a loved one has any  of these signs. ?This information is not intended to replace advice given to you by your health care provider. Make sure you discuss any questions you have with your health care provider. ?Document Revised: 03/12/2020 Document Reviewed: 03/12/2020 ?Elsevier Patient Education ? [place]. ? ?Seizure, Adult ?A seizure is a sudden burst of abnormal electrical and chemical activity in the brain. Seizures usually last from 30 seconds to 2 minutes. The  abnormal activity temporarily interrupts normal brain function. ?Many types of seizures can affect adults. A seizure can cause many different symptoms depending on where in the brain it starts. ?What are the c

## 2021-12-24 ENCOUNTER — Telehealth: Payer: Self-pay | Admitting: Obstetrics and Gynecology

## 2021-12-24 DIAGNOSIS — N760 Acute vaginitis: Secondary | ICD-10-CM

## 2021-12-24 LAB — LIPID PANEL
Chol/HDL Ratio: 4.7 ratio — ABNORMAL HIGH (ref 0.0–4.4)
Cholesterol, Total: 221 mg/dL — ABNORMAL HIGH (ref 100–199)
HDL: 47 mg/dL (ref >39)
LDL Chol Calc (NIH): 149 mg/dL — ABNORMAL HIGH (ref 0–99)
Triglycerides: 136 mg/dL (ref 0–149)
VLDL Cholesterol Cal: 25 mg/dL (ref 5–40)

## 2021-12-24 LAB — BASIC METABOLIC PANEL
BUN/Creatinine Ratio: 11 — ABNORMAL LOW (ref 12–28)
BUN: 10 mg/dL (ref 8–27)
CO2: 28 mmol/L (ref 20–29)
Calcium: 9.7 mg/dL (ref 8.7–10.3)
Chloride: 96 mmol/L (ref 96–106)
Creatinine, Ser: 0.89 mg/dL (ref 0.57–1.00)
Glucose: 99 mg/dL (ref 70–99)
Potassium: 4.6 mmol/L (ref 3.5–5.2)
Sodium: 134 mmol/L (ref 134–144)
eGFR: 68 mL/min/{1.73_m2} (ref >59)

## 2021-12-24 LAB — HEMOGLOBIN A1C
Est. average glucose Bld gHb Est-mCnc: 117 mg/dL
Hgb A1c MFr Bld: 5.7 % — ABNORMAL HIGH (ref 4.8–5.6)

## 2021-12-24 NOTE — Telephone Encounter (Signed)
Erin for refill of Estradiol cream to new pharmacy.  ?Sig:  1 gram pv at hs two to three times per week. ?Disp:  42.5 grams ?RF:  zero. ? ?Please check to see if an email can be included on a prescription for a patient.  ?I am not aware if this is HIPPA approved.  ?

## 2021-12-25 MED ORDER — ESTRADIOL 0.1 MG/GM VA CREA: TOPICAL_CREAM | VAGINAL | 0 refills | Status: DC

## 2021-12-25 NOTE — Telephone Encounter (Signed)
FYI. Verbal consent retained from pt to provide her e-mail address to pharmacy.  ? ?Rx sent.  ?

## 2021-12-25 NOTE — Telephone Encounter (Signed)
Encounter reviewed and closed.  

## 2021-12-26 ENCOUNTER — Telehealth: Payer: Self-pay | Admitting: *Deleted

## 2021-12-26 NOTE — Telephone Encounter (Signed)
-----   Message from Melvenia Beam, MD sent at 12/24/2021 12:54 PM EDT ----- ?Please call and let patient know this and ask if she wants me to start lipitor: You are technically per-diabetic but barely, just some diet changes should help. But your cholesterol is elevated, you should really be on a cholesterol medication. I would discuss with your primary care but if we find a stroke on your MRI we really should start you on some lipitor. I am happy to do so I fyou like, thanks dr Jaynee Eagles ?

## 2021-12-26 NOTE — Telephone Encounter (Signed)
Spoke with patient .Gave lab results patient wants to start Lipitor.  Informed patient  per dr.Ahern contact PCP  about lab results.Sent PCP lab work results today Will forward to Dr Jaynee Eagles to make her aware patient wants to start Lipitor. Pt thanked me for calling   ?

## 2021-12-27 ENCOUNTER — Other Ambulatory Visit: Payer: Self-pay | Admitting: Obstetrics and Gynecology

## 2021-12-30 ENCOUNTER — Other Ambulatory Visit: Payer: Self-pay | Admitting: Neurology

## 2021-12-30 MED ORDER — ATORVASTATIN CALCIUM 20 MG PO TABS: 20.0000 mg | ORAL_TABLET | Freq: Every day | ORAL | 6 refills | Status: DC

## 2021-12-30 NOTE — Telephone Encounter (Signed)
Called and spoke with pt. Advised rx lipitor sent to pharmacy and went over directions. She verbalized understanding.  ?

## 2022-01-01 ENCOUNTER — Ambulatory Visit (INDEPENDENT_AMBULATORY_CARE_PROVIDER_SITE_OTHER): Payer: Medicare Other | Admitting: Neurology

## 2022-01-01 DIAGNOSIS — R569 Unspecified convulsions: Secondary | ICD-10-CM

## 2022-01-01 DIAGNOSIS — G459 Transient cerebral ischemic attack, unspecified: Secondary | ICD-10-CM

## 2022-01-01 DIAGNOSIS — R42 Dizziness and giddiness: Secondary | ICD-10-CM

## 2022-01-01 DIAGNOSIS — R419 Unspecified symptoms and signs involving cognitive functions and awareness: Secondary | ICD-10-CM

## 2022-01-01 NOTE — Procedures (Signed)
? ? ?  History: ? ?76 year old woman with alteration of awareness  ? ?EEG classification: Awake and drowsy ? ?Description of the recording: The background rhythms of this recording consists of a fairly well modulated medium amplitude alpha rhythm of 10 Hz that is reactive to eye opening and closure. As the record progresses, the patient appears to remain in the waking state throughout the recording. Photic stimulation was performed, did not show any abnormalities. Hyperventilation was also performed, did not show any abnormalities. Toward the end of the recording, the patient enters the drowsy state with slight symmetric slowing seen. The patient never enters stage II sleep. No abnormal epileptiform discharges seen during this recording. There was no focal slowing. EKG monitor shows no evidence of cardiac rhythm abnormalities with a heart rate of 78. ? ?Abnormality: None  ? ?Impression: This is a normal EEG recording in the waking and drowsy state. No evidence of interictal epileptiform discharges seen. A normal EEG does not exclude a diagnosis of epilepsy.  ? ? ?Alric Ran, MD ?[facility] ?  ?

## 2022-01-02 ENCOUNTER — Encounter: Payer: Self-pay | Admitting: Nurse Practitioner

## 2022-01-02 ENCOUNTER — Ambulatory Visit (INDEPENDENT_AMBULATORY_CARE_PROVIDER_SITE_OTHER): Payer: Medicare Other | Admitting: Nurse Practitioner

## 2022-01-02 VITALS — BP 114/68 | HR 85 | Temp 97.8°F | Ht 62.99 in | Wt 148.1 lb

## 2022-01-02 DIAGNOSIS — K7460 Unspecified cirrhosis of liver: Secondary | ICD-10-CM | POA: Diagnosis not present

## 2022-01-02 DIAGNOSIS — R42 Dizziness and giddiness: Secondary | ICD-10-CM | POA: Diagnosis not present

## 2022-01-02 DIAGNOSIS — Z7689 Persons encountering health services in other specified circumstances: Secondary | ICD-10-CM | POA: Diagnosis not present

## 2022-01-02 DIAGNOSIS — Z6828 Body mass index (BMI) 28.0-28.9, adult: Secondary | ICD-10-CM | POA: Diagnosis not present

## 2022-01-02 DIAGNOSIS — I10 Essential (primary) hypertension: Secondary | ICD-10-CM

## 2022-01-02 MED ORDER — LOSARTAN POTASSIUM 25 MG PO TABS
25.0000 mg | ORAL_TABLET | Freq: Every day | ORAL | 1 refills | Status: DC
Start: 2022-01-02 — End: 2022-01-30

## 2022-01-02 NOTE — Progress Notes (Signed)
New Patient Office Visit  Subjective    Patient ID: Brenda [name], female    DOB: [birth year]  Age: 76 y.o. MRN: [id number]  CC:  Chief Complaint  Patient presents with   New Patient (Initial Visit)    HPI Brenda [name] presents to establish care The patient is coming from a different local provider. Lives in closer proximity to this office.  -dizziness. Having problems doing most things.  Has been going on for at least a few years. She is seeing a neurologist. She will be undergoing several, cardiovascular type tests. EEG done yesterday was normal. Scheduled for carotid doppler tomorrow.  -had fall and was taken to ER. Was labeled as TIA. That is when she was referred to neurologist.  -patient drove here today and today is the first time she has driven since she started having dizziness.  -blood pressure has been elevated for last several visits to previus provider visits. From messages from prevoius provider to patient, was started on valsartan '80mg'$ . This increased symptoms of dizziness. She is no longer taking this medication. According to patient's husband, the valsartan is 20 mg daily.   Outpatient Encounter Medications as of 01/02/2022  Medication Sig   aspirin EC 81 MG tablet Take 81 mg by mouth daily. Swallow whole.   atorvastatin (LIPITOR) 20 MG tablet Take 1 tablet (20 mg total) by mouth daily.   DULoxetine (CYMBALTA) 20 MG capsule Take 1 capsule (20 mg total) by mouth daily.   estradiol (ESTRACE) 0.1 MG/GM vaginal cream Use 1 g vaginally and to vulva two to three times per week as needed to maintain symptom relief.   losartan (COZAAR) 25 MG tablet Take 1 tablet (25 mg total) by mouth daily.   NONFORMULARY OR COMPOUNDED ITEM Gabapentin 6% cream in neutral base.  Apply to vulva tid. Disp:  60 grams, RF:  one.   omeprazole (PRILOSEC) 20 MG capsule Take 20 mg by mouth daily.   [DISCONTINUED] valsartan (DIOVAN) 80 MG tablet Take 1 tablet (80 mg total) by mouth daily. (Patient  not taking: Reported on 12/23/2021)   No facility-administered encounter medications on file as of 01/02/2022.    Past Medical History:  Diagnosis Date   Arthritis    Bronchitis    hx of when smoked    Cancer ([place]) 04/16/09   kidney cancer - tx by [facility] per her report released from f/u   Cataracts, bilateral    Diverticulitis 11/2004   Endometriosis 1994   GERD (gastroesophageal reflux disease)    hx hiatal hernia, hx esophageal stricture s/p dilation   Headache    Hepatitis    History of chicken pox    History of hiatal hernia    History of measles    History of mumps as a child    Hx of hepatitis    with cirrhosis - per her report from macrodantin and eval in 2013 at [facility]   Hyperlipidemia    Left ear pain    Migraines    pt states vesticular migraines has dizziness in relation    Numbness and tingling    feet bilat has had for 40 years   Right knee pain    Scarlet fever    Shingles    Urinary tract bacterial infections    hx of   Vertigo    associated with headache, intermittent, chronic    Past Surgical History:  Procedure Laterality Date   ABDOMINAL HYSTERECTOMY  1994   TAH/BSO due  to endometriosis   APPENDECTOMY     CATARACT EXTRACTION W/ INTRAOCULAR LENS IMPLANT Bilateral 05/2015, 04/2015   DIAGNOSTIC LAPAROSCOPY     LAPAROSCOPIC PARTIAL COLECTOMY N/A 04/10/2015   Procedure: LAPAROSCOPIC PARTIAL CECTOMY;  Surgeon: Ralene Ok, MD;  Location: [facility];  Service: General;  Laterality: N/A;   LUNG BIOPSY  1996   Negative   NEPHRECTOMY  04/16/09   Partial removal due to cancer   OOPHORECTOMY     TONSILLECTOMY     TONSILLECTOMY      Family History  Problem Relation Age of Onset   Thyroid cancer Mother    Cancer Mother        thyroid/ renal cell ca    Cancer Father    Colon cancer Neg Hx    Breast cancer Neg Hx    Stroke Neg Hx     Social History   Socioeconomic History   Marital status: Married    Spouse name: Not on file   Number of  children: 0   Years of education: Not on file   Highest education level: Not on file  Occupational History   Occupation: Retired    Fish farm manager: RETIRED  Tobacco Use   Smoking status: Former    Packs/day: 1.50    Years: 20.00    Pack years: 30.00    Types: Cigarettes    Quit date: 08/25/1990    Years since quitting: 31.4   Smokeless tobacco: Never  Vaping Use   Vaping Use: Never used  Substance and Sexual Activity   Alcohol use: Yes    Comment: Rare   Drug use: No   Sexual activity: Not Currently    Partners: Male    Birth control/protection: Surgical, Post-menopausal    Comment: TAH  Other Topics Concern   Not on file  Social History Narrative   Not on file   Social Determinants of Health   Financial Resource Strain: Low Risk    Difficulty of Paying Living Expenses: Not hard at all  Food Insecurity: No Food Insecurity   Worried About Charity fundraiser in the Last Year: Never true   Ran Out of Food in the Last Year: Never true  Transportation Needs: No Transportation Needs   Lack of Transportation (Medical): No   Lack of Transportation (Non-Medical): No  Physical Activity: Inactive   Days of Exercise per Week: 0 days   Minutes of Exercise per Session: 0 min  Stress: No Stress Concern Present   Feeling of Stress : Not at all  Social Connections: Moderately Integrated   Frequency of Communication with Friends and Family: More than three times a week   Frequency of Social Gatherings with Friends and Family: Once a week   Attends Religious Services: Never   Marine scientist or Organizations: Yes   Attends Archivist Meetings: Never   Marital Status: Married  Human resources officer Violence: Not At Risk   Fear of Current or Ex-Partner: No   Emotionally Abused: No   Physically Abused: No   Sexually Abused: No    Review of Systems  Constitutional:  Positive for malaise/fatigue. Negative for chills and fever.  HENT:  Negative for congestion, sinus pain and  sore throat.   Eyes: Negative.   Respiratory:  Negative for cough, shortness of breath and wheezing.   Cardiovascular:  Positive for leg swelling. Negative for chest pain and palpitations.       Elevated blood pressure.  Gastrointestinal:  Negative for constipation, diarrhea,  nausea and vomiting.  Genitourinary: Negative.   Musculoskeletal:  Positive for myalgias.  Skin: Negative.   Neurological:  Positive for dizziness and headaches.  Endo/Heme/Allergies:  Does not bruise/bleed easily.  Psychiatric/Behavioral:  Negative for depression. The patient is nervous/anxious.        Objective    Today's Vitals   01/02/22 1416  BP: 114/68  Pulse: 85  Temp: 97.8 F (36.6 C)  SpO2: 94%  Weight: 148 lb 1.9 oz (67.2 kg)  Height: 5' 2.99" (1.6 m)   Body mass index is 26.24 kg/m.   Physical Exam Vitals and nursing note reviewed.  Constitutional:      Appearance: Normal appearance. She is well-developed.  HENT:     Head: Normocephalic and atraumatic.  Eyes:     Pupils: Pupils are equal, round, and reactive to light.  Neck:     Vascular: No carotid bruit.  Cardiovascular:     Rate and Rhythm: Normal rate and regular rhythm.     Pulses: Normal pulses.     Heart sounds: Normal heart sounds.  Pulmonary:     Effort: Pulmonary effort is normal.     Breath sounds: Normal breath sounds.  Abdominal:     Palpations: Abdomen is soft.  Musculoskeletal:        General: Normal range of motion.     Cervical back: Normal range of motion and neck supple.  Lymphadenopathy:     Cervical: No cervical adenopathy.  Skin:    General: Skin is warm and dry.     Capillary Refill: Capillary refill takes less than 2 seconds.  Neurological:     General: No focal deficit present.     Mental Status: She is alert and oriented to person, place, and time.     Cranial Nerves: No cranial nerve deficit.     Sensory: No sensory deficit.     Motor: No weakness.     Coordination: Coordination normal.      Gait: Gait normal.     Deep Tendon Reflexes: Reflexes normal.  Psychiatric:        Attention and Perception: Attention and perception normal.        Mood and Affect: Mood is anxious and depressed.        Speech: Speech normal.        Behavior: Behavior normal. Behavior is cooperative.        Thought Content: Thought content normal.        Cognition and Memory: Cognition normal.        Judgment: Judgment normal.     Assessment & Plan:  1. Essential hypertension Patient currently valsartan 80 mg.  Blood pressure being low normal may be causing some dizziness.  We will change blood pressure medication to losartan 25 mg tablets daily.  Advised patient and her husband to monitor blood pressure daily.  Recommendation is to have blood pressure at 140/80 or better.  It should also be above 100/60.  Patient and her husband both understand and will contact the office if these numbers are too high or too low. - losartan (COZAAR) 25 MG tablet; Take 1 tablet (25 mg total) by mouth daily.  Dispense: 30 tablet; Refill: 1  2. Hepatic cirrhosis, unspecified hepatic cirrhosis type, unspecified whether ascites present [facility]) Patient should continue regular visits with her GI provider.  3. Dizziness This may be caused from blood pressure medication which is too high.  Change medication from valsartan 80 mg daily to losartan 25 mg daily.  Patient and her husband advised to purchase blood pressure carefully.  Parameters were provided.  She should also be very careful when changing positions such as lying to sitting or sitting to standing.  She should be slowly and give herself a few extra minutes to reorient after moving around.  4. Body mass index 28.0-28.9, adult Discussed lowering calorie intake to 1500 calories per day and incorporating exercise into daily routine to help lose weight.   5. Encounter to establish care Appointment today to establish new primary care provider      Problem List Items  Addressed This Visit       Cardiovascular and Mediastinum   Essential hypertension - Primary   Relevant Medications   losartan (COZAAR) 25 MG tablet     Digestive   Cirrhosis ([place])     Other   Dizziness   Body mass index 28.0-28.9, adult   Other Visit Diagnoses     Encounter to establish care           Return in about 4 weeks (around 01/30/2022) for blood pressure.   Ronnell Freshwater, NP

## 2022-01-03 ENCOUNTER — Ambulatory Visit (HOSPITAL_COMMUNITY)
Admission: RE | Admit: 2022-01-03 | Discharge: 2022-01-03 | Disposition: A | Discharge status: Home / Self Care | Payer: Medicare Other | Source: Ambulatory Visit | Attending: Cardiology | Admitting: Cardiology

## 2022-01-03 ENCOUNTER — Encounter: Payer: Self-pay | Admitting: Neurology

## 2022-01-03 DIAGNOSIS — R569 Unspecified convulsions: Secondary | ICD-10-CM | POA: Diagnosis not present

## 2022-01-03 DIAGNOSIS — R42 Dizziness and giddiness: Secondary | ICD-10-CM | POA: Insufficient documentation

## 2022-01-03 DIAGNOSIS — G459 Transient cerebral ischemic attack, unspecified: Secondary | ICD-10-CM | POA: Insufficient documentation

## 2022-01-03 DIAGNOSIS — R419 Unspecified symptoms and signs involving cognitive functions and awareness: Secondary | ICD-10-CM

## 2022-01-06 ENCOUNTER — Telehealth: Payer: Self-pay | Admitting: *Deleted

## 2022-01-06 NOTE — Telephone Encounter (Signed)
LVM for patient to call back for test results  

## 2022-01-06 NOTE — Telephone Encounter (Signed)
-----   Message from Melvenia Beam, MD sent at 01/03/2022  2:05 PM EDT ----- ?There is no evidence of stenosis in the blood vessels on carotid ultrasound. thanks ?

## 2022-01-08 ENCOUNTER — Telehealth: Payer: Self-pay | Admitting: *Deleted

## 2022-01-08 NOTE — Telephone Encounter (Signed)
-----   Message from Melvenia Beam, MD sent at 01/03/2022  2:05 PM EDT ----- ?There is no evidence of stenosis in the blood vessels on carotid ultrasound. thanks ?

## 2022-01-08 NOTE — Telephone Encounter (Signed)
Called patient LVM for patient to call back for test results  01/08/2022  ?

## 2022-01-09 ENCOUNTER — Encounter: Payer: Self-pay | Admitting: *Deleted

## 2022-01-09 ENCOUNTER — Ambulatory Visit (HOSPITAL_COMMUNITY): Payer: Medicare Other | Attending: Cardiovascular Disease

## 2022-01-09 ENCOUNTER — Ambulatory Visit: Payer: Medicare Other

## 2022-01-09 DIAGNOSIS — R42 Dizziness and giddiness: Secondary | ICD-10-CM

## 2022-01-09 DIAGNOSIS — G459 Transient cerebral ischemic attack, unspecified: Secondary | ICD-10-CM

## 2022-01-09 DIAGNOSIS — R419 Unspecified symptoms and signs involving cognitive functions and awareness: Secondary | ICD-10-CM

## 2022-01-09 DIAGNOSIS — R569 Unspecified convulsions: Secondary | ICD-10-CM | POA: Diagnosis not present

## 2022-01-09 NOTE — Progress Notes (Signed)
Patient ID: Brenda [name], female   DOB: 17-Mar-1946, 76 y.o.   MRN: [id number] Patient came to [facility] office today to have cardiac event monitor applied. While going through instructions, patient informed me she did not feel this was going to work for her because of the adhesive.  Explained to patient this monitor had several types of electrodes we could try.   She still would like to take the monitor instructions and information home to discuss with her husband first, since she is having some difficulty understanding and remembering. I gave the patient the monitor instruction book and a monitor electrode strip she could apply to her skin to see if she had an adverse or allergic reaction. Patient will contact our office to let us know if she wants to proceed or refuse the test.

## 2022-01-10 LAB — ECHOCARDIOGRAM COMPLETE BUBBLE STUDY
Area-P 1/2: 3.66 cm2
S' Lateral: 2 cm

## 2022-01-13 DIAGNOSIS — I10 Essential (primary) hypertension: Secondary | ICD-10-CM | POA: Insufficient documentation

## 2022-01-13 DIAGNOSIS — Z6828 Body mass index (BMI) 28.0-28.9, adult: Secondary | ICD-10-CM | POA: Insufficient documentation

## 2022-01-14 NOTE — Progress Notes (Signed)
GYNECOLOGY  VISIT   HPI: 76 y.o.   Married  Caucasian  female   [id number] with Patient's last menstrual period was 08/25/1992.   here for follow up recheck.  Her husband is present today.   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  Symptoms started in June, 2022 on chart review. She reports she has a painful vagina every day, and she would like a cream to take her pain away. She also reports dysuria every day and would like evaluation for this.  UC negative 06/27/21.   No vaginal itching or discharge.  She has had several negative vaginitis tests.   She uses vaginal estrogen cream.  Using the vaginal cream 1 gram every other day.   Using Gabapentin 6% cream to vulva tid.   She was started on Cymbalta 20 mg daily. She states this is not helping her.     Had a neurological event on February 4th.  Her husband reports it was a possible TIA.  She may be starting physical therapy for dizziness through [facility]   GYNECOLOGIC HISTORY: Patient's last menstrual period was 08/25/1992. Contraception:  Hyst/BSO Menopausal hormone therapy:  Estrace cream Last mammogram:  08-12-21 Neg/BiRads1 Last pap smear:   2009 Neg        OB History     Gravida  0   Para  0   Term  0   Preterm  0   AB  0   Living  0      SAB  0   IAB  0   Ectopic  0   Multiple  0   Live Births  0              Patient Active Problem List   Diagnosis Date Noted   Essential hypertension 01/13/2022   Body mass index 28.0-28.9, adult 01/13/2022   Left sided abdominal pain 06/27/2021   Hyperlipidemia 11/22/2019   S/P partial resection of colon 04/10/2015   Cirrhosis ([place]) 12/08/2014   Esophageal reflux 12/08/2014   Esophageal stricture 12/08/2014   Dizziness 12/08/2014    Past Medical History:  Diagnosis Date   Arthritis    Bronchitis    hx of when smoked    Cancer ([place]) 04/16/09   kidney cancer - tx by [facility] per her  report released from f/u   Cataracts, bilateral    Diverticulitis 11/2004   Endometriosis 1994   GERD (gastroesophageal reflux disease)    hx hiatal hernia, hx esophageal stricture s/p dilation   Headache    Hepatitis    History of chicken pox    History of hiatal hernia    History of measles    History of mumps as a child    Hx of hepatitis    with cirrhosis - per her report from macrodantin and eval in 2013 at [facility]   Hyperlipidemia    Left ear pain    Migraines    pt states vesticular migraines has dizziness in relation    Numbness and tingling    feet bilat has had for 40 years   Right knee pain    Scarlet fever    Shingles    Urinary tract bacterial infections    hx of   Vertigo    associated with headache, intermittent, chronic    Past Surgical History:  Procedure Laterality Date   ABDOMINAL HYSTERECTOMY  1994   TAH/BSO due to endometriosis   APPENDECTOMY  CATARACT EXTRACTION W/ INTRAOCULAR LENS IMPLANT Bilateral 05/2015, 04/2015   DIAGNOSTIC LAPAROSCOPY     LAPAROSCOPIC PARTIAL COLECTOMY N/A 04/10/2015   Procedure: LAPAROSCOPIC PARTIAL CECTOMY;  Surgeon: Ralene Ok, MD;  Location: [facility];  Service: General;  Laterality: N/A;   LUNG BIOPSY  1996   Negative   NEPHRECTOMY  04/16/09   Partial removal due to cancer   OOPHORECTOMY     TONSILLECTOMY     TONSILLECTOMY      Current Outpatient Medications  Medication Sig Dispense Refill   aspirin EC 81 MG tablet Take 81 mg by mouth daily. Swallow whole.     cycloSPORINE (RESTASIS) 0.05 % ophthalmic emulsion 1 drop 2 (two) times daily.     DULoxetine (CYMBALTA) 20 MG capsule Take 1 capsule (20 mg total) by mouth daily. 30 capsule 1   estradiol (ESTRACE) 0.1 MG/GM vaginal cream Use 1 g vaginally and to vulva two to three times per week as needed to maintain symptom relief. 42.5 g 0   NONFORMULARY OR COMPOUNDED ITEM Gabapentin 6% cream in neutral base.  Apply to vulva tid. Disp:  60 grams, RF:  one. 100 each 1    omeprazole (PRILOSEC) 20 MG capsule Take 20 mg by mouth daily.     atorvastatin (LIPITOR) 20 MG tablet Take 1 tablet (20 mg total) by mouth daily. (Patient not taking: Reported on 01/16/2022) 90 tablet 6   losartan (COZAAR) 25 MG tablet Take 1 tablet (25 mg total) by mouth daily. (Patient not taking: Reported on 01/16/2022) 30 tablet 1   No current facility-administered medications for this visit.     ALLERGIES: Codeine, Dexilant [dexlansoprazole], Nitrofurantoin, Other, Ranitidine, Septra [sulfamethoxazole-trimethoprim], and Sulfamethoxazole-trimethoprim  Family History  Problem Relation Age of Onset   Thyroid cancer Mother    Cancer Mother        thyroid/ renal cell ca    Cancer Father    Colon cancer Neg Hx    Breast cancer Neg Hx    Stroke Neg Hx     Social History   Socioeconomic History   Marital status: Married    Spouse name: Not on file   Number of children: 0   Years of education: Not on file   Highest education level: Not on file  Occupational History   Occupation: Retired    Fish farm manager: RETIRED  Tobacco Use   Smoking status: Former    Packs/day: 1.50    Years: 20.00    Pack years: 30.00    Types: Cigarettes    Quit date: 08/25/1990    Years since quitting: 31.4   Smokeless tobacco: Never  Vaping Use   Vaping Use: Never used  Substance and Sexual Activity   Alcohol use: Yes    Comment: Rare   Drug use: No   Sexual activity: Not Currently    Partners: Male    Birth control/protection: Surgical, Post-menopausal    Comment: TAH  Other Topics Concern   Not on file  Social History Narrative   Not on file   Social Determinants of Health   Financial Resource Strain: Low Risk    Difficulty of Paying Living Expenses: Not hard at all  Food Insecurity: No Food Insecurity   Worried About Charity fundraiser in the Last Year: Never true   Ran Out of Food in the Last Year: Never true  Transportation Needs: No Transportation Needs   Lack of Transportation  (Medical): No   Lack of Transportation (Non-Medical): No  Physical Activity: Inactive  Days of Exercise per Week: 0 days   Minutes of Exercise per Session: 0 min  Stress: No Stress Concern Present   Feeling of Stress : Not at all  Social Connections: Moderately Integrated   Frequency of Communication with Friends and Family: More than three times a week   Frequency of Social Gatherings with Friends and Family: Once a week   Attends Religious Services: Never   Marine scientist or Organizations: Yes   Attends Archivist Meetings: Never   Marital Status: Married  Human resources officer Violence: Not At Risk   Fear of Current or Ex-Partner: No   Emotionally Abused: No   Physically Abused: No   Sexually Abused: No    Review of Systems  All other systems reviewed and are negative.  PHYSICAL EXAMINATION:    BP (!) 152/74 (BP Location: Right Arm, Patient Position: Sitting, Cuff Size: Normal)   Wt 148 lb (67.1 kg)   LMP 08/25/1992   BMI 26.22 kg/m     General appearance: alert, cooperative and appears stated age  Pelvic: External genitalia:  no lesions              Urethra:  normal appearing urethra with no masses, tenderness or lesions              Bartholins and Skenes: normal                 Vagina: normal appearing vagina with normal color and discharge, no lesions.  White cream is present in the vaginal and is removed to do her evaluation.               Cervix: no lesions                Bimanual Exam:  Uterus:  normal size, contour, position, consistency, mobility, non-tender              Adnexa: no mass, fullness, tenderness           Chaperone was present for exam:  yes.  ASSESSMENT  Status post TAH/BSO for endometriosis.  Vaginal pain.  Vulvar pain.  Vaginal atrophy.  Possible prior neurological event.   PLAN  Will increase Cymbalta to 30 mg daily.   Continue vaginal estrogen 1 gram pv three nights per week.  I did discuss that estrogens may increase  risk stroke.  She will continue with Gabapentin cream externally to vulva three times daily.  She declines switching the Gabapentin to an oral formulation.  She declines referral to pelvic pain clinic [facility], [place].  She accepts referral to Dr. Sherlene Shams for further evaluation and treatment.  I did mention to the patient pelvic floor therapy, but I will defer to Dr. Wannetta Sender for this.     An After Visit Summary was printed and given to the patient.  37 min  total time was spent for this patient encounter, including preparation, face-to-face counseling with the patient, coordination of care, and documentation of the encounter.

## 2022-01-16 ENCOUNTER — Ambulatory Visit (INDEPENDENT_AMBULATORY_CARE_PROVIDER_SITE_OTHER): Payer: Medicare Other | Admitting: Obstetrics and Gynecology

## 2022-01-16 VITALS — BP 152/74 | Wt 148.0 lb

## 2022-01-16 DIAGNOSIS — R102 Pelvic and perineal pain: Secondary | ICD-10-CM

## 2022-01-16 DIAGNOSIS — N952 Postmenopausal atrophic vaginitis: Secondary | ICD-10-CM | POA: Diagnosis not present

## 2022-01-16 DIAGNOSIS — R300 Dysuria: Secondary | ICD-10-CM | POA: Diagnosis not present

## 2022-01-16 MED ORDER — DULOXETINE HCL 30 MG PO CPEP: 30.0000 mg | ORAL_CAPSULE | Freq: Every day | ORAL | 5 refills | Status: DC

## 2022-01-16 NOTE — Patient Instructions (Signed)
Please send me a My Chart message in mid July as to how you are doing on the increased dosage of Cymbalta.   You will now start taking Cymbalta 30 mg daily.   You will receive a phone call to schedule with Dr. Sherlene Shams.

## 2022-01-18 LAB — URINE CULTURE
MICRO NUMBER:: 13445175
SPECIMEN QUALITY:: ADEQUATE

## 2022-01-19 ENCOUNTER — Encounter: Payer: Self-pay | Admitting: Obstetrics and Gynecology

## 2022-01-19 ENCOUNTER — Telehealth: Payer: Self-pay | Admitting: Obstetrics and Gynecology

## 2022-01-19 DIAGNOSIS — R102 Pelvic and perineal pain: Secondary | ICD-10-CM

## 2022-01-19 MED ORDER — AMOXICILLIN-POT CLAVULANATE 500-125 MG PO TABS: 1.0000 | ORAL_TABLET | Freq: Two times a day (BID) | ORAL | 0 refills | Status: DC

## 2022-01-19 NOTE — Addendum Note (Signed)
Addended by: [name], Dietrich Pates E on: 01/19/2022 02:06 PM   Modules accepted: Orders

## 2022-01-19 NOTE — Telephone Encounter (Signed)
Please make a referral to Dr. Sherlene Shams for chronic vulvar/vaginal pain.

## 2022-01-20 ENCOUNTER — Encounter: Payer: Self-pay | Admitting: Obstetrics and Gynecology

## 2022-01-21 ENCOUNTER — Telehealth: Payer: Self-pay | Admitting: Obstetrics and Gynecology

## 2022-01-21 NOTE — Telephone Encounter (Signed)
Referral sent 

## 2022-01-21 NOTE — Telephone Encounter (Signed)
Ok for gabapentin cream 6% in neutral base.  Apply to vulva tid as needed.  Disp:  100 grams RF:  none

## 2022-01-22 ENCOUNTER — Ambulatory Visit: Payer: Medicare Other | Attending: Neurology

## 2022-01-22 VITALS — BP 129/87 | HR 82

## 2022-01-22 DIAGNOSIS — R2689 Other abnormalities of gait and mobility: Secondary | ICD-10-CM | POA: Insufficient documentation

## 2022-01-22 DIAGNOSIS — R2681 Unsteadiness on feet: Secondary | ICD-10-CM | POA: Diagnosis not present

## 2022-01-22 DIAGNOSIS — R42 Dizziness and giddiness: Secondary | ICD-10-CM | POA: Diagnosis not present

## 2022-01-22 NOTE — Patient Instructions (Addendum)
Gaze Stabilization: Sitting    Keeping eyes on target on wall 2-3 feet away, tilt head down 15-30 and move head side to side for 30 seconds. Repeat while moving head up and down for 30 seconds. Do 2-3 sessions per day.

## 2022-01-22 NOTE — Therapy (Signed)
OUTPATIENT PHYSICAL THERAPY VESTIBULAR EVALUATION     Patient Name: Brenda [name] MRN: [id number] DOB:1946/05/01, 76 y.o., female Today's Date: 01/22/2022  PCP: Ronnell Freshwater, NP REFERRING PROVIDER: Melvenia Beam, MD   PT End of Session - 01/22/22 [phone]     Visit Number 1    Number of Visits 7    Date for PT Re-Evaluation 03/07/22    Authorization Type Medicare A+B/BCBS    Progress Note Due on Visit 10    PT Start Time 0930    PT Stop Time 1020    PT Time Calculation (min) 50 min    Activity Tolerance Patient tolerated treatment well    Behavior During Therapy [facility] for tasks assessed/performed             Past Medical History:  Diagnosis Date   Arthritis    Bronchitis    hx of when smoked    Cancer ([place]) 04/16/09   kidney cancer - tx by [facility] per her report released from f/u   Cataracts, bilateral    Diverticulitis 11/2004   Endometriosis 1994   GERD (gastroesophageal reflux disease)    hx hiatal hernia, hx esophageal stricture s/p dilation   Headache    Hepatitis    History of chicken pox    History of hiatal hernia    History of measles    History of mumps as a child    Hx of hepatitis    with cirrhosis - per her report from macrodantin and eval in 2013 at [facility]   Hyperlipidemia    Left ear pain    Migraines    pt states vesticular migraines has dizziness in relation    Numbness and tingling    feet bilat has had for 40 years   Right knee pain    Scarlet fever    Shingles    Urinary tract bacterial infections    hx of   Vertigo    associated with headache, intermittent, chronic   Past Surgical History:  Procedure Laterality Date   ABDOMINAL HYSTERECTOMY  1994   TAH/BSO due to endometriosis   APPENDECTOMY     CATARACT EXTRACTION W/ INTRAOCULAR LENS IMPLANT Bilateral 05/2015, 04/2015   DIAGNOSTIC LAPAROSCOPY     LAPAROSCOPIC PARTIAL COLECTOMY N/A 04/10/2015   Procedure: LAPAROSCOPIC PARTIAL CECTOMY;  Surgeon: Ralene Ok, MD;   Location: [facility];  Service: General;  Laterality: N/A;   LUNG BIOPSY  1996   Negative   NEPHRECTOMY  04/16/09   Partial removal due to cancer   OOPHORECTOMY     TONSILLECTOMY     TONSILLECTOMY     Patient Active Problem List   Diagnosis Date Noted   Essential hypertension 01/13/2022   Body mass index 28.0-28.9, adult 01/13/2022   Left sided abdominal pain 06/27/2021   Hyperlipidemia 11/22/2019   S/P partial resection of colon 04/10/2015   Cirrhosis ([place]) 12/08/2014   Esophageal reflux 12/08/2014   Esophageal stricture 12/08/2014   Dizziness 12/08/2014    ONSET DATE: 12/23/21  REFERRING DIAG: R42 (ICD-10-CM) - Dizziness  THERAPY DIAG:  Dizziness and giddiness  Unsteadiness on feet  Other abnormalities of gait and mobility  Rationale for Evaluation and Treatment Rehabilitation  SUBJECTIVE:   SUBJECTIVE STATEMENT: Reports has had extensive testing at [facility] with no significant findings. Reports it has been a chronic issue, most notable in the last few years. Patient reports that she feels dizzy all the time, reports she feels like it is there all the  time. Reports no significant exacerbating factors. Husband reports it seems to increase with a return to upright position. Patient reports she feels unstable on her feet, and is scared to fall. No falls. Is using a SPC out in the community, does not use it in the house. No recent migraines. No hearing or vision changes. Denies tinnitus or fullness. Did have an episode in February due to stroke like symptoms, but no significant findings on test. Husband reports believe it was a TIA.   Pt accompanied by: significant other; Husband Bill   PERTINENT HISTORY: Arthritis, Hx of Kidney Cancer, Diverticulitis, GERD, HA, Hepatitis, Migraines  PAIN:  Are you having pain? No Today's Vitals   01/22/22 0945  BP: 129/87  Pulse: 82   There is no height or weight on file to calculate BMI.   PRECAUTIONS: Fall  WEIGHT BEARING RESTRICTIONS  No  FALLS: Has patient fallen in last 6 months? No  LIVING ENVIRONMENT: Lives with: lives with their family Lives in: House/apartment Stairs: Yes: Internal: 12 steps; can reach both; reports that she needs the railing  Has following equipment at home: Single point cane  PLOF: Independent with household mobility without device and Independent with community mobility with device  PATIENT GOALS Improve the Dizziness  OBJECTIVE:   DIAGNOSTIC FINDINGS: MRI ordered; not yet completed    SENSATION: Reports history of tingling/numbness in BLE's  POSTURE: rounded shoulders and forward head  STRENGTH: WFL  TRANSFERS: Assistive device utilized: Single point cane  Sit to stand: SBA Stand to sit: SBA  GAIT: Gait pattern: decreased step length- Right, decreased step length- Left, narrow BOS, poor foot clearance- Right, and poor foot clearance- Left Distance walked: 100 ft Assistive device utilized: None Level of assistance: CGA Comments: unsteadiness noted; shortened step length bil. Very slow cautious gait noted especially after DVA testing  PATIENT SURVEYS:  Staff did not capture on eval   VESTIBULAR ASSESSMENT    SYMPTOM BEHAVIOR:   Subjective history: see subjective   Non-Vestibular symptoms: headaches   Type of dizziness: Imbalance (Disequilibrium), Unsteady with head/body turns, Lightheadedness/Faint, and "Funny feeling in the head"   Frequency: daily   Duration: constant   Aggravating factors: No known aggravating factors and occurs all the time; does experience some increase with return to upright   Relieving factors: slow movements   Progression of symptoms: unchanged   OCULOMOTOR EXAM:   Ocular Alignment: normal   Ocular ROM: No Limitations   Spontaneous Nystagmus: absent   Gaze-Induced Nystagmus: absent   Smooth Pursuits: intact   Saccades: intact      VESTIBULAR - OCULAR REFLEX:    Slow VOR: Normal; reports that "VOR" feels scary   VOR Cancellation:  Normal   Head-Impulse Test: HIT Right: positive HIT Left: negative   Dynamic Visual Acuity: Static: 9 Dynamic: 6    MOTION SENSITIVITY:    Motion Sensitivity Quotient  Intensity: 0 = none, 1 = Lightheaded, 2 = Mild, 3 = Moderate, 4 = Severe, 5 = Vomiting  Intensity  1. Sitting to supine 3-4  2. Supine to L side 0  3. Supine to R side 2  4. Supine to sitting 1 (reports severe lightheadedness)  5. L Hallpike-Dix   6. Up from L    7. R Hallpike-Dix   8. Up from R    9. Sitting, head  tipped to L knee   10. Head up from L  knee   11. Sitting, head  tipped to R knee   12.  Head up from R  knee   13. Sitting head turns x5   14.Sitting head nods x5   15. In stance, 180  turn to L    16. In stance, 180  turn to R     OTHOSTATICS: Supine: 133/86 with HR 76; Seated: 146/85 with HR 78, reports moderate lightheadedness with supine > sit, Standing: 156/96 with HR 80, patient reports mild lightheadedness  FUNCTIONAL GAIT:  To Be Assessed at Future Visit   VESTIBULAR TREATMENT:  Gaze Adaptation:   x1 Viewing Horizontal: Position: seated, Time: 30 secs, and Reps: 1  and x1 Viewing Vertical:  Position: seated, Time: 30 secs, and Reps: 1   PATIENT EDUCATION: Education details: Educated on [employer]; VOR HEP Person educated: Patient and Spouse Education method: Explanation Education comprehension: verbalized understanding   GOALS: Goals reviewed with patient? Yes  SHORT TERM GOALS: Target date: 02/14/2022   Pt will be independent with initial HEP for improved gaze stabilization and balance  Baseline: VOR HEP established Goal status: INITIAL  2.  FGA TBA and LTG to be set as applicable Baseline: TBA Goal status: INITIAL   LONG TERM GOALS: Target date: 03/07/2022    Pt will be independent with final HEP for improved balance and gaze stabilization  Baseline: VOR HEP established Goal status: INITIAL  2.  Pt will improve FGA by 4 points from baseline to  demonstrate improved balance and reduced fall risk  Baseline: TBA Goal status: INITIAL  3.  Pt will improve DVA to </= 2 line difference to indicate improved VOR Baseline: 3 line difference Goal status: INITIAL  4.  Pt will report </= 2/5 for all movements on MSQ to indicate improvement in motion sensitivity and improved activity tolerance.   Baseline: 3-4 Goal status: INITIAL   ASSESSMENT:  CLINICAL IMPRESSION: Patient is a 76 y.o. female referred to Neuro OPPT services for Dizziness. Patient's PMH significant for the following: Arthritis, Hx of Kidney Cancer, Diverticulitis, GERD, HA, Hepatitis, Migraines. Upon evaluation, patient presents with the following impairments: abnormal gait, decreased balance, dizziness, and increased fall risk. Patient presented with increased motion sensitivity, positive HIT on R, and 3 Line difference on DVA indicating impaired VOR. Initiated gaze stabilization HEP. Will perform further balance assessment at future visit. Patient will benefit from skilled PT services to address impairments.     OBJECTIVE IMPAIRMENTS decreased activity tolerance, decreased balance, difficulty walking, decreased safety awareness, dizziness, postural dysfunction, and pain.   ACTIVITY LIMITATIONS stairs, transfers, and locomotion level  PARTICIPATION LIMITATIONS: driving and community activity  PERSONAL FACTORS Age, Time since onset of injury/illness/exacerbation, and 3+ comorbidities: Arthritis, Hx of Kidney Cancer, Diverticulitis, GERD, HA, Hepatitis, Migraines  are also affecting patient's functional outcome.   REHAB POTENTIAL: Fair    CLINICAL DECISION MAKING: Evolving/moderate complexity  EVALUATION COMPLEXITY: Moderate   PLAN: PT FREQUENCY: 1x/week  PT DURATION: 6 weeks  PLANNED INTERVENTIONS: Therapeutic exercises, Therapeutic activity, Neuromuscular re-education, Balance training, Gait training, Patient/Family education, Joint mobilization, Stair training,  Vestibular training, Canalith repositioning, DME instructions, Aquatic Therapy, Cryotherapy, Moist heat, and Manual therapy  PLAN FOR NEXT SESSION: Finish MSQ. Review VOR. Add Balance to HEP and Habituation as Needed. Assess FGA if have time.    Jones Bales, PT, DPT 01/22/2022, 10:45 AM

## 2022-01-23 ENCOUNTER — Telehealth: Payer: Self-pay | Admitting: Obstetrics and Gynecology

## 2022-01-23 DIAGNOSIS — N760 Acute vaginitis: Secondary | ICD-10-CM

## 2022-01-23 MED ORDER — ESTRADIOL 0.1 MG/GM VA CREA: TOPICAL_CREAM | VAGINAL | 1 refills | Status: DC

## 2022-01-23 MED ORDER — NONFORMULARY OR COMPOUNDED ITEM: 0 refills | Status: DC

## 2022-01-23 NOTE — Telephone Encounter (Signed)
Brenda [name] in Rx to [place]. Pharm Tech advised not to Rx that much due to Rx going to expire before all used. Pharm tech advised per pt's request. She voiced understanding.

## 2022-01-23 NOTE — Telephone Encounter (Signed)
Refer to telephone encounter on 01/23/22 w/ increased Disp: of Gabapentin Rx.

## 2022-01-23 NOTE — Telephone Encounter (Signed)
See My chart messages.   Ok for gabapentin cream 6%.  Apply sparingly to vulva tid prn.  Disp:  300 grams RF:  zero.

## 2022-01-23 NOTE — Telephone Encounter (Signed)
FYI. Looks like the Rx was sent for one 42.5g tube after request on 12/23/21 to requested pharmacy w/ email on 12/25/21 w/ 0RF. Will resend and notify pt via mychart. However, this time will print and manually fax.

## 2022-01-23 NOTE — Telephone Encounter (Signed)
A My Chart message came through for this patient without specifications of the requested medication.  I see estradiol linked to the message.   The prescription that I am giving her of estradiol is: Estradiol, Estrace, 0.1 mg/gram vaginal cream.  Sig:  Use 1 gram total vaginally and to vulva two to three times per week as needed to maintain symptom relief.   Disp:  42.5 grams.  RF:  one  Please see the instructions in My Chart about needing to include the patient's email on the prescription.

## 2022-01-24 NOTE — Telephone Encounter (Signed)
Thank you for the update. You may close this encounter once complete.

## 2022-01-24 NOTE — Telephone Encounter (Signed)
Thank you for the update.  You may close this encounter.  

## 2022-01-27 ENCOUNTER — Other Ambulatory Visit: Payer: Self-pay | Admitting: Obstetrics and Gynecology

## 2022-01-28 ENCOUNTER — Ambulatory Visit: Payer: Medicare Other | Attending: Neurology

## 2022-01-28 VITALS — BP 133/80 | HR 78

## 2022-01-28 DIAGNOSIS — R42 Dizziness and giddiness: Secondary | ICD-10-CM | POA: Insufficient documentation

## 2022-01-28 DIAGNOSIS — R2689 Other abnormalities of gait and mobility: Secondary | ICD-10-CM | POA: Diagnosis not present

## 2022-01-28 DIAGNOSIS — R2681 Unsteadiness on feet: Secondary | ICD-10-CM | POA: Diagnosis not present

## 2022-01-28 NOTE — Therapy (Signed)
OUTPATIENT PHYSICAL THERAPY VESTIBULAR TREATMENT NOTE   Patient Name: Brenda [name] MRN: [id number] DOB:[birth year], 76 y.o., female Today's Date: 01/28/2022  PCP: Ronnell Freshwater, NP REFERRING PROVIDER:  Melvenia Beam, MD   PT End of Session - 01/28/22 1225     Visit Number 2    Number of Visits 7    Date for PT Re-Evaluation 03/07/22    Authorization Type Medicare A+B/BCBS    Progress Note Due on Visit 10    PT Start Time 3546    PT Stop Time 5681    PT Time Calculation (min) 42 min    Activity Tolerance Patient tolerated treatment well    Behavior During Therapy WFL for tasks assessed/performed             Past Medical History:  Diagnosis Date   Arthritis    Bronchitis    hx of when smoked    Cancer ([place]) 04/16/09   kidney cancer - tx by [facility] per her report released from f/u   Cataracts, bilateral    Diverticulitis 11/2004   Endometriosis 1994   GERD (gastroesophageal reflux disease)    hx hiatal hernia, hx esophageal stricture s/p dilation   Headache    Hepatitis    History of chicken pox    History of hiatal hernia    History of measles    History of mumps as a child    Hx of hepatitis    with cirrhosis - per her report from macrodantin and eval in 2013 at [facility]   Hyperlipidemia    Left ear pain    Migraines    pt states vesticular migraines has dizziness in relation    Numbness and tingling    feet bilat has had for 40 years   Right knee pain    Scarlet fever    Shingles    Urinary tract bacterial infections    hx of   Vertigo    associated with headache, intermittent, chronic   Past Surgical History:  Procedure Laterality Date   ABDOMINAL HYSTERECTOMY  1994   TAH/BSO due to endometriosis   APPENDECTOMY     CATARACT EXTRACTION W/ INTRAOCULAR LENS IMPLANT Bilateral 05/2015, 04/2015   DIAGNOSTIC LAPAROSCOPY     LAPAROSCOPIC PARTIAL COLECTOMY N/A 04/10/2015   Procedure: LAPAROSCOPIC PARTIAL CECTOMY;  Surgeon: Ralene Ok, MD;   Location: [facility];  Service: General;  Laterality: N/A;   LUNG BIOPSY  1996   Negative   NEPHRECTOMY  04/16/09   Partial removal due to cancer   OOPHORECTOMY     TONSILLECTOMY     TONSILLECTOMY     Patient Active Problem List   Diagnosis Date Noted   Essential hypertension 01/13/2022   Body mass index 28.0-28.9, adult 01/13/2022   Left sided abdominal pain 06/27/2021   Hyperlipidemia 11/22/2019   S/P partial resection of colon 04/10/2015   Cirrhosis ([place]) 12/08/2014   Esophageal reflux 12/08/2014   Esophageal stricture 12/08/2014   Dizziness 12/08/2014    ONSET DATE: 12/23/21  REFERRING DIAG: R42 (ICD-10-CM) - Dizziness  THERAPY DIAG:  Dizziness and giddiness  Unsteadiness on feet  Other abnormalities of gait and mobility  Rationale for Evaluation and Treatment Rehabilitation  PERTINENT HISTORY: Arthritis, Hx of Kidney Cancer, Diverticulitis, GERD, HA, Hepatitis, Migraines  PRECAUTIONS: Fall  SUBJECTIVE: Patient reports feeling about the same. Reports that she tried to do the exercise most days, did forget some days. No other new changes/complaints. Reports was sitting in the lobby and  the dizziness hit her. Has the dizziness hit her approximately twice a day.   PAIN:  Are you having pain? No Today's Vitals   01/28/22 1237  BP: 133/80  Pulse: 78   There is no height or weight on file to calculate BMI.   OBJECTIVE:  MOTION SENSITIVITY:                         Motion Sensitivity Quotient   Intensity: 0 = none, 1 = Lightheaded, 2 = Mild, 3 = Moderate, 4 = Severe, 5 = Vomiting   Intensity  1. Sitting to supine 3-4  2. Supine to L side 0  3. Supine to R side 2  4. Supine to sitting 1 (reports severe lightheadedness)  5. L Hallpike-Dix 0  6. Up from L  1  7. R Hallpike-Dix 0  8. Up from R  1  9. Sitting, head  tipped to L knee 0  10. Head up from L  knee 3  11. Sitting, head  tipped to R knee 0  12. Head up from R  knee 3  13. Sitting head turns x5 3   14.Sitting head nods x5 3-4  15. In stance, 180  turn to L  3 (increased postural sway)  16. In stance, 180  turn to R 3 (increased postural sway)     VESTIBULAR TREATMENT:  Gaze Adaptation: x1 Viewing Horizontal: Position: seated, Time: 30 secs, and Reps: 2, did note some double vision with horizontal, but does not occur and x1 Viewing Vertical:  Position: seated, Time: 30 seconds, and Reps: 2, more symptoms noted with vertical. Patient reports increased  dizziness and tired after completion.   Saccades:    Seated completed horizontal saccades x 10 reps to R/L with moderate dizziness, cues for proper completion. Then completed vertical saccades to x 10 reps up/down with no increase in symptoms. Did not trial diagonal saccades due to symptoms (withheld diagonal from HEP). Intermittent seated breaks due to symptoms.   Updated HEP to include the following.   Oculomotor: Saccades    Holding two targets positioned side by side 8-10 inches apart, move eyes quickly from target to target as head stays still. Move 10 reps each direction (including right and left, and up and down) Perform sitting.    Head Motion: Side to Side    Sitting, slowly move head to right with eyes open. Hold position until symptoms subside. Then, move head slowly to opposite side. Hold position until symptoms subside. Repeat 5 times per session. Do 2 sessions per day.   PATIENT EDUCATION: Education details: Continue HEP; HEP Updates Person educated: Patient and Spouse Education method: Explanation Education comprehension: verbalized understanding     GOALS: Goals reviewed with patient? Yes   SHORT TERM GOALS: Target date: 02/14/2022    Pt will be independent with initial HEP for improved gaze stabilization and balance  Baseline: VOR HEP established Goal status: INITIAL   2.  FGA TBA and LTG to be set as applicable Baseline: TBA Goal status: INITIAL     LONG TERM GOALS: Target date: 03/07/2022      Pt will be independent with final HEP for improved balance and gaze stabilization  Baseline: VOR HEP established Goal status: INITIAL   2.  Pt will improve FGA by 4 points from baseline to demonstrate improved balance and reduced fall risk  Baseline: TBA Goal status: INITIAL   3.  Pt will improve DVA to </=  2 line difference to indicate improved VOR Baseline: 3 line difference Goal status: INITIAL   4.  Pt will report </= 2/5 for all movements on MSQ to indicate improvement in motion sensitivity and improved activity tolerance.   Baseline: 3-4 Goal status: INITIAL     ASSESSMENT:   CLINICAL IMPRESSION: Today's skilled PT session included finishing assessing MSQ, with patient demonstrating moderate dizziness with activities, most notable with head movement and full body movement. Rest of session spent reviewing VOR and adding habituation to HEP to further address symptoms. Still experiencing moderate symptoms during session requiring intermittent rest breaks. Will continue per POC.        OBJECTIVE IMPAIRMENTS decreased activity tolerance, decreased balance, difficulty walking, decreased safety awareness, dizziness, postural dysfunction, and pain.    ACTIVITY LIMITATIONS stairs, transfers, and locomotion level   PARTICIPATION LIMITATIONS: driving and community activity   PERSONAL FACTORS Age, Time since onset of injury/illness/exacerbation, and 3+ comorbidities: Arthritis, Hx of Kidney Cancer, Diverticulitis, GERD, HA, Hepatitis, Migraines  are also affecting patient's functional outcome.    REHAB POTENTIAL: Fair     CLINICAL DECISION MAKING: Evolving/moderate complexity   EVALUATION COMPLEXITY: Moderate     PLAN: PT FREQUENCY: 1x/week   PT DURATION: 6 weeks   PLANNED INTERVENTIONS: Therapeutic exercises, Therapeutic activity, Neuromuscular re-education, Balance training, Gait training, Patient/Family education, Joint mobilization, Stair training, Vestibular training,  Canalith repositioning, DME instructions, Aquatic Therapy, Cryotherapy, Moist heat, and Manual therapy   PLAN FOR NEXT SESSION: Add Balance to HEP Assess FGA if have time. Continue habituation, VOR and oculomotor exercises   Jones Bales, PT, DPT 01/28/2022, 1:13 PM

## 2022-01-28 NOTE — Patient Instructions (Signed)
Oculomotor: Saccades    Holding two targets positioned side by side 8-10 inches apart, move eyes quickly from target to target as head stays still. Move 10 reps each direction (including right and left, and up and down) Perform sitting.    Head Motion: Side to Side    Sitting, slowly move head to right with eyes open. Hold position until symptoms subside. Then, move head slowly to opposite side. Hold position until symptoms subside. Repeat 5 times per session. Do 2 sessions per day.

## 2022-01-30 ENCOUNTER — Other Ambulatory Visit: Payer: Self-pay | Admitting: Nurse Practitioner

## 2022-01-30 DIAGNOSIS — I10 Essential (primary) hypertension: Secondary | ICD-10-CM

## 2022-02-04 ENCOUNTER — Ambulatory Visit: Payer: Medicare Other | Admitting: Nurse Practitioner

## 2022-02-04 NOTE — Telephone Encounter (Signed)
Mychart msg sent to pt providing her w/ Dr. Tommas Olp number to call and schedule appt.

## 2022-02-07 ENCOUNTER — Other Ambulatory Visit: Payer: Self-pay | Admitting: Gastroenterology

## 2022-02-07 ENCOUNTER — Other Ambulatory Visit: Payer: Self-pay | Admitting: Obstetrics and Gynecology

## 2022-02-07 DIAGNOSIS — K7460 Unspecified cirrhosis of liver: Secondary | ICD-10-CM

## 2022-02-07 NOTE — Telephone Encounter (Signed)
Pharmacy attached message to Rx" requesting 90 day supply"   Rx was sent on 01/16/22 with 5 refills  Last gyn annual I could see is 09/2019

## 2022-02-09 ENCOUNTER — Encounter: Payer: Self-pay | Admitting: Obstetrics and Gynecology

## 2022-02-09 ENCOUNTER — Encounter: Payer: Self-pay | Admitting: Nurse Practitioner

## 2022-02-09 DIAGNOSIS — N760 Acute vaginitis: Secondary | ICD-10-CM

## 2022-02-10 MED ORDER — DULOXETINE HCL 30 MG PO CPEP: 30.0000 mg | ORAL_CAPSULE | Freq: Every day | ORAL | 1 refills | Status: DC

## 2022-02-10 NOTE — Telephone Encounter (Signed)
Discontinued ordered med on 02/08/22. Please authorize change.

## 2022-02-11 ENCOUNTER — Ambulatory Visit: Payer: Medicare Other

## 2022-02-11 VITALS — BP 132/87 | HR 80

## 2022-02-11 DIAGNOSIS — R2689 Other abnormalities of gait and mobility: Secondary | ICD-10-CM

## 2022-02-11 DIAGNOSIS — R2681 Unsteadiness on feet: Secondary | ICD-10-CM

## 2022-02-11 DIAGNOSIS — R42 Dizziness and giddiness: Secondary | ICD-10-CM

## 2022-02-11 NOTE — Therapy (Signed)
OUTPATIENT PHYSICAL THERAPY VESTIBULAR TREATMENT NOTE   Patient Name: Brenda [name] MRN: [id number] DOB:[birth year], 76 y.o., female Today's Date: 02/11/2022  PCP: Ronnell Freshwater, NP REFERRING PROVIDER:  Melvenia Beam, MD   PT End of Session - 02/11/22 1221     Visit Number 3    Number of Visits 7    Date for PT Re-Evaluation 03/07/22    Authorization Type Medicare A+B/BCBS    Progress Note Due on Visit 10    PT Start Time 4481    PT Stop Time 1311    PT Time Calculation (min) 41 min    Activity Tolerance Patient tolerated treatment well    Behavior During Therapy [facility] for tasks assessed/performed             Past Medical History:  Diagnosis Date   Arthritis    Bronchitis    hx of when smoked    Cancer ([place]) 04/16/09   kidney cancer - tx by [facility] per her report released from f/u   Cataracts, bilateral    Diverticulitis 11/2004   Endometriosis 1994   GERD (gastroesophageal reflux disease)    hx hiatal hernia, hx esophageal stricture s/p dilation   Headache    Hepatitis    History of chicken pox    History of hiatal hernia    History of measles    History of mumps as a child    Hx of hepatitis    with cirrhosis - per her report from macrodantin and eval in 2013 at [facility]   Hyperlipidemia    Left ear pain    Migraines    pt states vesticular migraines has dizziness in relation    Numbness and tingling    feet bilat has had for 40 years   Right knee pain    Scarlet fever    Shingles    Urinary tract bacterial infections    hx of   Vertigo    associated with headache, intermittent, chronic   Past Surgical History:  Procedure Laterality Date   ABDOMINAL HYSTERECTOMY  1994   TAH/BSO due to endometriosis   APPENDECTOMY     CATARACT EXTRACTION W/ INTRAOCULAR LENS IMPLANT Bilateral 05/2015, 04/2015   DIAGNOSTIC LAPAROSCOPY     LAPAROSCOPIC PARTIAL COLECTOMY N/A 04/10/2015   Procedure: LAPAROSCOPIC PARTIAL CECTOMY;  Surgeon: Ralene Ok, MD;   Location: [facility];  Service: General;  Laterality: N/A;   LUNG BIOPSY  1996   Negative   NEPHRECTOMY  04/16/09   Partial removal due to cancer   OOPHORECTOMY     TONSILLECTOMY     TONSILLECTOMY     Patient Active Problem List   Diagnosis Date Noted   Essential hypertension 01/13/2022   Body mass index 28.0-28.9, adult 01/13/2022   Left sided abdominal pain 06/27/2021   Hyperlipidemia 11/22/2019   S/P partial resection of colon 04/10/2015   Cirrhosis ([place]) 12/08/2014   Esophageal reflux 12/08/2014   Esophageal stricture 12/08/2014   Dizziness 12/08/2014    ONSET DATE: 12/23/21  REFERRING DIAG: R42 (ICD-10-CM) - Dizziness  THERAPY DIAG:  Dizziness and giddiness  Unsteadiness on feet  Other abnormalities of gait and mobility  Rationale for Evaluation and Treatment Rehabilitation  PERTINENT HISTORY: Arthritis, Hx of Kidney Cancer, Diverticulitis, GERD, HA, Hepatitis, Migraines  PRECAUTIONS: Fall  SUBJECTIVE: Reports no dizziness this morning, but felt just when she stood up in the lobby when PT called her back. Patient reports it as a lightheadedness. Has not checked her BP  today.    PAIN:  Are you having pain? No  Today's Vitals   02/11/22 1237  BP: 132/87  Pulse: 80   There is no height or weight on file to calculate BMI.   OBJECTIVE:   [facility] PT Assessment - 02/11/22 0001       Functional Gait  Assessment   Gait assessed  Yes    Gait Level Surface Walks 20 ft, slow speed, abnormal gait pattern, evidence for imbalance or deviates 10-15 in outside of the 12 in walkway width. Requires more than 7 sec to ambulate 20 ft.    Change in Gait Speed Able to change speed, demonstrates mild gait deviations, deviates 6-10 in outside of the 12 in walkway width, or no gait deviations, unable to achieve a major change in velocity, or uses a change in velocity, or uses an assistive device.    Gait with Horizontal Head Turns Performs head turns with moderate changes in gait  velocity, slows down, deviates 10-15 in outside 12 in walkway width but recovers, can continue to walk.   moderate increase in dizziness   Gait with Vertical Head Turns Performs task with moderate change in gait velocity, slows down, deviates 10-15 in outside 12 in walkway width but recovers, can continue to walk.   mild increase in dizziness   Gait and Pivot Turn Pivot turns safely in greater than 3 sec and stops with no loss of balance, or pivot turns safely within 3 sec and stops with mild imbalance, requires small steps to catch balance.    Step Over Obstacle Is able to step over one shoe box (4.5 in total height) but must slow down and adjust steps to clear box safely. May require verbal cueing.    Gait with Narrow Base of Support Ambulates less than 4 steps heel to toe or cannot perform without assistance.    Gait with Eyes Closed Walks 20 ft, slow speed, abnormal gait pattern, evidence for imbalance, deviates 10-15 in outside 12 in walkway width. Requires more than 9 sec to ambulate 20 ft.   significant dizziness   Ambulating Backwards Walks 20 ft, slow speed, abnormal gait pattern, evidence for imbalance, deviates 10-15 in outside 12 in walkway width.    Steps Alternating feet, must use rail.    Total Score 12    FGA comment: 12/30            Completed the following balance exercises and added to HEP:  Access Code: [email or web address] URL: [email or web address] Date: 02/11/2022 Prepared by: Baldomero Lamy  Exercises - Romberg Stance with Head Rotation  - 1 x daily - 5 x weekly - 2 sets - 5 reps - Romberg Stance with Eyes Closed  - 1 x daily - 5 x weekly - 1 sets - 3 reps - 30 seconds hold -educated to keep 1-2 inches between her feet to help will progress to full romberg.  Educated on proper completion of HEP and in corner for improved safety.   Withheld vertical head turns due to dizziness.   PATIENT EDUCATION: Education details: FGA Results; Balance Additions to  HEP Person educated: Patient and Spouse Education method: Explanation Education comprehension: verbalized understanding     GOALS: Goals reviewed with patient? Yes   SHORT TERM GOALS: Target date: 02/14/2022    Pt will be independent with initial HEP for improved gaze stabilization and balance  Baseline: VOR HEP established Goal status: INITIAL   2.  FGA TBA and LTG to be set as applicable  Baseline: TBA; Assessed on 02/11/22 Goal status: MET     LONG TERM GOALS: Target date: 03/07/2022     Pt will be independent with final HEP for improved balance and gaze stabilization  Baseline: VOR HEP established Goal status: INITIAL   2.  Pt will improve FGA by 4 points from baseline to demonstrate improved balance and reduced fall risk  Baseline: TBA; 12/30 Goal status: INITIAL   3.  Pt will improve DVA to </= 2 line difference to indicate improved VOR Baseline: 3 line difference Goal status: INITIAL   4.  Pt will report </= 2/5 for all movements on MSQ to indicate improvement in motion sensitivity and improved activity tolerance.   Baseline: 3-4 Goal status: INITIAL     ASSESSMENT:   CLINICAL IMPRESSION: Today's skilled PT session focused on further balance assessment with FGA, patient scoring 12/30 indicating high fall risk. Patient having increased dizziness and imbalance with head/body turns, eyes closed, and narrow BOS. Rest of session spent on standing balance and added to HEP. Will continue per POC.        OBJECTIVE IMPAIRMENTS decreased activity tolerance, decreased balance, difficulty walking, decreased safety awareness, dizziness, postural dysfunction, and pain.    ACTIVITY LIMITATIONS stairs, transfers, and locomotion level   PARTICIPATION LIMITATIONS: driving and community activity   PERSONAL FACTORS Age, Time since onset of injury/illness/exacerbation, and 3+ comorbidities: Arthritis, Hx of Kidney Cancer, Diverticulitis, GERD, HA, Hepatitis, Migraines  are also  affecting patient's functional outcome.    REHAB POTENTIAL: Fair     CLINICAL DECISION MAKING: Evolving/moderate complexity   EVALUATION COMPLEXITY: Moderate     PLAN: PT FREQUENCY: 1x/week   PT DURATION: 6 weeks   PLANNED INTERVENTIONS: Therapeutic exercises, Therapeutic activity, Neuromuscular re-education, Balance training, Gait training, Patient/Family education, Joint mobilization, Stair training, Vestibular training, Canalith repositioning, DME instructions, Aquatic Therapy, Cryotherapy, Moist heat, and Manual therapy   PLAN FOR NEXT SESSION: How was Balance to HEP; Continue habituation, VOR and oculomotor exercises   Jones Bales, PT, DPT 02/11/2022, 1:15 PM

## 2022-02-11 NOTE — Patient Instructions (Signed)
Access Code: [email or web address] URL: [email or web address] Date: 02/11/2022 Prepared by: Baldomero Lamy  Exercises - Romberg Stance with Head Rotation  - 1 x daily - 5 x weekly - 2 sets - 5 reps - Romberg Stance with Eyes Closed  - 1 x daily - 5 x weekly - 1 sets - 3 reps - 30 seconds hold

## 2022-02-13 ENCOUNTER — Other Ambulatory Visit: Payer: Self-pay | Admitting: Nurse Practitioner

## 2022-02-13 DIAGNOSIS — I10 Essential (primary) hypertension: Secondary | ICD-10-CM

## 2022-02-18 ENCOUNTER — Ambulatory Visit: Payer: Medicare Other

## 2022-02-18 VITALS — BP 132/74

## 2022-02-18 DIAGNOSIS — R2681 Unsteadiness on feet: Secondary | ICD-10-CM

## 2022-02-18 DIAGNOSIS — R42 Dizziness and giddiness: Secondary | ICD-10-CM | POA: Diagnosis not present

## 2022-02-18 DIAGNOSIS — R2689 Other abnormalities of gait and mobility: Secondary | ICD-10-CM

## 2022-02-18 NOTE — Therapy (Signed)
OUTPATIENT PHYSICAL THERAPY VESTIBULAR TREATMENT NOTE   Patient Name: Brenda [name] MRN: [id number] DOB:1946/04/21, 76 y.o., female Today's Date: 02/18/2022  PCP: Carlean Jews, NP REFERRING PROVIDER:  Anson Fret, MD   PT End of Session - 02/18/22 1101     Visit Number 4    Number of Visits 7    Date for PT Re-Evaluation 03/07/22    Authorization Type Medicare A+B/BCBS    Progress Note Due on Visit 10    PT Start Time 1101    PT Stop Time 1145    PT Time Calculation (min) 44 min    Activity Tolerance Patient tolerated treatment well    Behavior During Therapy WFL for tasks assessed/performed             Past Medical History:  Diagnosis Date   Arthritis    Bronchitis    hx of when smoked    Cancer ([facility]) 04/16/09   kidney cancer - tx by [facility] per her report released from f/u   Cataracts, bilateral    Diverticulitis 11/2004   Endometriosis 1994   GERD (gastroesophageal reflux disease)    hx hiatal hernia, hx esophageal stricture s/p dilation   Headache    Hepatitis    History of chicken pox    History of hiatal hernia    History of measles    History of mumps as a child    Hx of hepatitis    with cirrhosis - per her report from macrodantin and eval in 2013 at [facility]   Hyperlipidemia    Left ear pain    Migraines    pt states vesticular migraines has dizziness in relation    Numbness and tingling    feet bilat has had for 40 years   Right knee pain    Scarlet fever    Shingles    Urinary tract bacterial infections    hx of   Vertigo    associated with headache, intermittent, chronic   Past Surgical History:  Procedure Laterality Date   ABDOMINAL HYSTERECTOMY  1994   TAH/BSO due to endometriosis   APPENDECTOMY     CATARACT EXTRACTION W/ INTRAOCULAR LENS IMPLANT Bilateral 05/2015, 04/2015   DIAGNOSTIC LAPAROSCOPY     LAPAROSCOPIC PARTIAL COLECTOMY N/A 04/10/2015   Procedure: LAPAROSCOPIC PARTIAL CECTOMY;  Surgeon: Axel Filler, MD;   Location: [facility];  Service: General;  Laterality: N/A;   LUNG BIOPSY  1996   Negative   NEPHRECTOMY  04/16/09   Partial removal due to cancer   OOPHORECTOMY     TONSILLECTOMY     TONSILLECTOMY     Patient Active Problem List   Diagnosis Date Noted   Essential hypertension 01/13/2022   Body mass index 28.0-28.9, adult 01/13/2022   Left sided abdominal pain 06/27/2021   Hyperlipidemia 11/22/2019   S/P partial resection of colon 04/10/2015   Cirrhosis ([facility]) 12/08/2014   Esophageal reflux 12/08/2014   Esophageal stricture 12/08/2014   Dizziness 12/08/2014    ONSET DATE: 12/23/21  REFERRING DIAG: R42 (ICD-10-CM) - Dizziness  THERAPY DIAG:  Dizziness and giddiness  Unsteadiness on feet  Other abnormalities of gait and mobility  Rationale for Evaluation and Treatment Rehabilitation  PERTINENT HISTORY: Arthritis, Hx of Kidney Cancer, Diverticulitis, GERD, HA, Hepatitis, Migraines  PRECAUTIONS: Fall  SUBJECTIVE: Patient reports that on Sunday reports she woke up and was dizzy. Has not calmed down. Reports that she is having some spinning. Reports it is not going away. No other  new changes. No falls. Using a RW today.   PAIN:  Are you having pain? No  Today's Vitals   02/18/22 1112  BP: 132/74   There is no height or weight on file to calculate BMI.   OBJECTIVE:    Vestibular Assessment - 02/18/22 0001       Positional Testing   Sidelying Test Sidelying Right;Sidelying Left      Sidelying Right   Sidelying Right Duration 0    Sidelying Right Symptoms No nystagmus      Sidelying Left   Sidelying Left Duration 0    Sidelying Left Symptoms No nystagmus             Gaze Stabilization:  VOR x 1 Horizontal: seated, 2 reps, x 30 seconds, cues for form, range and maintaining eyes focused.  VOR x 1 Vertical: seated, 2 reps, x 30 seconds, mod cues for form, range and maintaining eyes focused. Reports increased dizziness with this, reports fatigue.  Standing  Balance: Surface: Airex Position: Feet Hip Width Apart Completed with: Eyes Open; Head Turns x 10 Reps and Head Nods x 10 Reps (completed 2 sets x 5 reps).  Then completed eyes closed with feet hip width 3 x 30 seconds. CGA as needed. Minimal sway noted but patient reports uncomfortable with completing. Reports mild increase in dizziness.    Habituation: [place]: completed x 3 reps bilaterally, with symptoms returning to upright position. Maintained position to return to baseline symptoms. Educated on proper completion and added to HEP (see below).  Added to HEP:  Sit to Side-Lying    Sit on edge of bed. 1. Turn head 45 to right. 2. Maintain head position and lie down slowly on left side. Hold until symptoms subside. 3. Sit up slowly. Hold until symptoms subside. 4. Turn head 45 to left. 5. Maintain head position and lie down slowly on right side. Hold until symptoms subside. 6. Sit up slowly. Repeat sequence 3 times per session. Do 1 sessions per day.    PATIENT EDUCATION: Education details: [place] Addition; Monitor BP in Mornings Person educated: Patient and Spouse Education method: Explanation Education comprehension: verbalized understanding     GOALS: Goals reviewed with patient? Yes   SHORT TERM GOALS: Target date: 02/14/2022    Pt will be independent with initial HEP for improved gaze stabilization and balance  Baseline: VOR HEP established; Independent with current HEP Goal status: MET   2.  FGA TBA and LTG to be set as applicable Baseline: TBA; Assessed on 02/11/22 Goal status: MET     LONG TERM GOALS: Target date: 03/07/2022     Pt will be independent with final HEP for improved balance and gaze stabilization  Baseline: VOR HEP established Goal status: INITIAL   2.  Pt will improve FGA by 4 points from baseline to demonstrate improved balance and reduced fall risk  Baseline: TBA; 12/30 Goal status: INITIAL   3.  Pt will improve DVA to </= 2 line  difference to indicate improved VOR Baseline: 3 line difference Goal status: INITIAL   4.  Pt will report </= 2/5 for all movements on MSQ to indicate improvement in motion sensitivity and improved activity tolerance.   Baseline: 3-4 Goal status: INITIAL     ASSESSMENT:   CLINICAL IMPRESSION: Today's skilled PT session focused on positional assessment with no nystagmus/symptoms noted with Sidelying Test bilaterally. Initiated habituation for [employer], and educated to monitor BP upon sitting up in the morning. Patient was  negative for orthostatics at prior visit. Continued Gaze and Standing Balance to continue to progress toward all LTGs.      OBJECTIVE IMPAIRMENTS decreased activity tolerance, decreased balance, difficulty walking, decreased safety awareness, dizziness, postural dysfunction, and pain.    ACTIVITY LIMITATIONS stairs, transfers, and locomotion level   PARTICIPATION LIMITATIONS: driving and community activity   PERSONAL FACTORS Age, Time since onset of injury/illness/exacerbation, and 3+ comorbidities: Arthritis, Hx of Kidney Cancer, Diverticulitis, GERD, HA, Hepatitis, Migraines  are also affecting patient's functional outcome.    REHAB POTENTIAL: Fair     CLINICAL DECISION MAKING: Evolving/moderate complexity   EVALUATION COMPLEXITY: Moderate     PLAN: PT FREQUENCY: 1x/week   PT DURATION: 6 weeks   PLANNED INTERVENTIONS: Therapeutic exercises, Therapeutic activity, Neuromuscular re-education, Balance training, Gait training, Patient/Family education, Joint mobilization, Stair training, Vestibular training, Canalith repositioning, DME instructions, Aquatic Therapy, Cryotherapy, Moist heat, and Manual therapy   PLAN FOR NEXT SESSION: Continue Balance; Continue habituation, VOR and oculomotor exercises   Tempie Donning, PT, DPT 02/18/2022, 11:53 AM

## 2022-02-20 NOTE — Telephone Encounter (Signed)
Please contact patient regarding her prescription for vaginal estrogen cream.  She received a prescription on 01/23/22 and has 1 refill.   Please confirm that she is following the directions properly.  She should not be needing further refills yet.

## 2022-02-20 NOTE — Telephone Encounter (Signed)
Well visit 09/26/19.  Neg Mamm 08/12/2021.

## 2022-02-20 NOTE — Addendum Note (Signed)
Addended by: Ramond Craver on: 02/20/2022 08:56 AM   Modules accepted: Orders

## 2022-02-25 ENCOUNTER — Encounter: Payer: Self-pay | Admitting: Neurology

## 2022-02-26 ENCOUNTER — Ambulatory Visit: Payer: Medicare Other | Attending: Neurology

## 2022-02-26 ENCOUNTER — Other Ambulatory Visit: Payer: Self-pay | Admitting: Neurology

## 2022-02-26 VITALS — BP 124/69 | HR 82

## 2022-02-26 DIAGNOSIS — R2689 Other abnormalities of gait and mobility: Secondary | ICD-10-CM | POA: Diagnosis not present

## 2022-02-26 DIAGNOSIS — R2681 Unsteadiness on feet: Secondary | ICD-10-CM | POA: Diagnosis not present

## 2022-02-26 DIAGNOSIS — R42 Dizziness and giddiness: Secondary | ICD-10-CM | POA: Insufficient documentation

## 2022-02-26 DIAGNOSIS — E785 Hyperlipidemia, unspecified: Secondary | ICD-10-CM

## 2022-02-26 NOTE — Therapy (Signed)
OUTPATIENT PHYSICAL THERAPY VESTIBULAR TREATMENT NOTE   Patient Name: Brenda [name] MRN: [id number] DOB:[birth year], 76 y.o., female Today's Date: 02/26/2022  PCP: Ronnell Freshwater, NP REFERRING PROVIDER:  Melvenia Beam, MD   PT End of Session - 02/26/22 1101     Visit Number 5    Number of Visits 7    Date for PT Re-Evaluation 03/07/22    Authorization Type Medicare A+B/BCBS    Progress Note Due on Visit 10    PT Start Time 1101    PT Stop Time 1142    PT Time Calculation (min) 41 min    Activity Tolerance Patient tolerated treatment well    Behavior During Therapy WFL for tasks assessed/performed              Past Medical History:  Diagnosis Date   Arthritis    Bronchitis    hx of when smoked    Cancer ([place]) 04/16/09   kidney cancer - tx by [facility] per her report released from f/u   Cataracts, bilateral    Diverticulitis 11/2004   Endometriosis 1994   GERD (gastroesophageal reflux disease)    hx hiatal hernia, hx esophageal stricture s/p dilation   Headache    Hepatitis    History of chicken pox    History of hiatal hernia    History of measles    History of mumps as a child    Hx of hepatitis    with cirrhosis - per her report from macrodantin and eval in 2013 at [facility]   Hyperlipidemia    Left ear pain    Migraines    pt states vesticular migraines has dizziness in relation    Numbness and tingling    feet bilat has had for 40 years   Right knee pain    Scarlet fever    Shingles    Urinary tract bacterial infections    hx of   Vertigo    associated with headache, intermittent, chronic   Past Surgical History:  Procedure Laterality Date   ABDOMINAL HYSTERECTOMY  1994   TAH/BSO due to endometriosis   APPENDECTOMY     CATARACT EXTRACTION W/ INTRAOCULAR LENS IMPLANT Bilateral 05/2015, 04/2015   DIAGNOSTIC LAPAROSCOPY     LAPAROSCOPIC PARTIAL COLECTOMY N/A 04/10/2015   Procedure: LAPAROSCOPIC PARTIAL CECTOMY;  Surgeon: Ralene Ok, MD;   Location: [facility];  Service: General;  Laterality: N/A;   LUNG BIOPSY  1996   Negative   NEPHRECTOMY  04/16/09   Partial removal due to cancer   OOPHORECTOMY     TONSILLECTOMY     TONSILLECTOMY     Patient Active Problem List   Diagnosis Date Noted   Essential hypertension 01/13/2022   Body mass index 28.0-28.9, adult 01/13/2022   Left sided abdominal pain 06/27/2021   Hyperlipidemia 11/22/2019   S/P partial resection of colon 04/10/2015   Cirrhosis ([place]) 12/08/2014   Esophageal reflux 12/08/2014   Esophageal stricture 12/08/2014   Dizziness 12/08/2014    ONSET DATE: 12/23/21  REFERRING DIAG: R42 (ICD-10-CM) - Dizziness  THERAPY DIAG:  Dizziness and giddiness  Unsteadiness on feet  Other abnormalities of gait and mobility  Rationale for Evaluation and Treatment Rehabilitation  PERTINENT HISTORY: Arthritis, Hx of Kidney Cancer, Diverticulitis, GERD, HA, Hepatitis, Migraines  PRECAUTIONS: Fall  SUBJECTIVE: Patient reports has had a couple good days, but felt dizzy this morning. Patient reports 5/10 dizziness at baseline today. Walking with SPC today vs. RW. No falls.  PAIN:  Are you having pain? No Today's Vitals   02/26/22 1108  BP: 124/69  Pulse: 82   There is no height or weight on file to calculate BMI.   OBJECTIVE:  Gaze Stabilization:  VOR x 1 Horizontal: standing, x 30 seconds, x 40 seconds, cues for form, range and maintaining eyes focused. Pt continue to demo inability to keep eyes focused initially. Mild postural sway noted VOR x 1 Vertical: standing, x 30 seconds, x 40 seconds, mod cues for form, range and maintaining eyes focused. Continue to report increased dizziness with vertical > horizontal. More postural sway also noted with increasing dizziness. Seated rest break required.   NMR:  Ambulation with Head Turns: Completed ambulation without AD x 115' with horizontal head turns intermittent, CGA required due to mild balance challenge. Reports mod  dizziness with completion, requiring seated rest break. Then completed addition 115' with vertical head turns. More challenge with horizontal > vertical.   Stepping Strategy: Standing on Airex completed anterior/posterior stepping strategy with UE support and completed x 10 reps anterior followed by x 10 reps posterior. Progressing from UE support to no UE support. CGA.   Rockerboard: Standing on Rockerboard A/P with eyes open and maintaining board steady x 30 seconds with eyes open. Then added in horizontal/vertical head turns x 10 reps each direction. More challenge with horizontal > vertical.   Standing Balance: Surface: Airex Position: Feet Hip Width Apart Completed with: Eyes Closed; 3 x 15-20 seconds. Increased sway noted.      PATIENT EDUCATION: Education details: Continue HEP; HEP Compliance Person educated: Patient and Spouse Education method: Explanation Education comprehension: verbalized understanding     GOALS: Goals reviewed with patient? Yes   SHORT TERM GOALS: Target date: 02/14/2022    Pt will be independent with initial HEP for improved gaze stabilization and balance  Baseline: VOR HEP established; Independent with current HEP Goal status: MET   2.  FGA TBA and LTG to be set as applicable Baseline: TBA; Assessed on 02/11/22 Goal status: MET     LONG TERM GOALS: Target date: 03/07/2022     Pt will be independent with final HEP for improved balance and gaze stabilization  Baseline: VOR HEP established Goal status: INITIAL   2.  Pt will improve FGA by 4 points from baseline to demonstrate improved balance and reduced fall risk  Baseline: TBA; 12/30 Goal status: INITIAL   3.  Pt will improve DVA to </= 2 line difference to indicate improved VOR Baseline: 3 line difference Goal status: INITIAL   4.  Pt will report </= 2/5 for all movements on MSQ to indicate improvement in motion sensitivity and improved activity tolerance.   Baseline: 3-4 Goal status:  INITIAL     ASSESSMENT:   CLINICAL IMPRESSION: Today's skilled PT session focused on continued Gaze Stabilization progression to standing and increased time, mod dizziness with rest breaks required. Continued education on compliance. Rest of session focused on dynamic/standing balance. Will continue per POC.    OBJECTIVE IMPAIRMENTS decreased activity tolerance, decreased balance, difficulty walking, decreased safety awareness, dizziness, postural dysfunction, and pain.    ACTIVITY LIMITATIONS stairs, transfers, and locomotion level   PARTICIPATION LIMITATIONS: driving and community activity   PERSONAL FACTORS Age, Time since onset of injury/illness/exacerbation, and 3+ comorbidities: Arthritis, Hx of Kidney Cancer, Diverticulitis, GERD, HA, Hepatitis, Migraines  are also affecting patient's functional outcome.    REHAB POTENTIAL: Fair     CLINICAL DECISION MAKING: Evolving/moderate complexity   EVALUATION COMPLEXITY:  Moderate     PLAN: PT FREQUENCY: 1x/week   PT DURATION: 6 weeks   PLANNED INTERVENTIONS: Therapeutic exercises, Therapeutic activity, Neuromuscular re-education, Balance training, Gait training, Patient/Family education, Joint mobilization, Stair training, Vestibular training, Canalith repositioning, DME instructions, Aquatic Therapy, Cryotherapy, Moist heat, and Manual therapy   PLAN FOR NEXT SESSION: Check Goals + decide if re-cert/d/c. Continue Balance; Continue habituation, VOR and oculomotor exercises   Jones Bales, PT, DPT 02/26/2022, 11:45 AM

## 2022-02-27 NOTE — Telephone Encounter (Signed)
I called patient regarding referral Dr.Schroeder office has called x3 and no return call back from patient to schedule. I spoke with patient and she reports no longer having any pain. She asked referral to be closed. Just FYI.

## 2022-03-04 ENCOUNTER — Ambulatory Visit: Payer: Medicare Other

## 2022-03-10 ENCOUNTER — Ambulatory Visit: Payer: Medicare Other

## 2022-03-14 ENCOUNTER — Other Ambulatory Visit (INDEPENDENT_AMBULATORY_CARE_PROVIDER_SITE_OTHER): Payer: Self-pay

## 2022-03-14 ENCOUNTER — Ambulatory Visit: Payer: Medicare Other

## 2022-03-14 VITALS — BP 130/87 | HR 80

## 2022-03-14 DIAGNOSIS — Z0289 Encounter for other administrative examinations: Secondary | ICD-10-CM

## 2022-03-14 DIAGNOSIS — R42 Dizziness and giddiness: Secondary | ICD-10-CM

## 2022-03-14 DIAGNOSIS — R2689 Other abnormalities of gait and mobility: Secondary | ICD-10-CM | POA: Diagnosis not present

## 2022-03-14 DIAGNOSIS — E785 Hyperlipidemia, unspecified: Secondary | ICD-10-CM

## 2022-03-14 DIAGNOSIS — R2681 Unsteadiness on feet: Secondary | ICD-10-CM | POA: Diagnosis not present

## 2022-03-14 NOTE — Therapy (Addendum)
OUTPATIENT PHYSICAL THERAPY VESTIBULAR TREATMENT NOTE/RE-CERTIFICATION   Patient Name: Brenda [name] MRN: [id number] DOB:[birth year], 76 y.o., female Today's Date: 03/14/2022  PCP: Ronnell Freshwater, NP REFERRING PROVIDER:  Melvenia Beam, MD   PT End of Session - 03/14/22 1017     Visit Number 6    Number of Visits 12    Date for PT Re-Evaluation 04/25/22    Authorization Type Medicare A+B/BCBS    Progress Note Due on Visit 10    PT Start Time 1017    PT Stop Time 1058    PT Time Calculation (min) 41 min    Activity Tolerance Patient tolerated treatment well    Behavior During Therapy WFL for tasks assessed/performed              Past Medical History:  Diagnosis Date   Arthritis    Bronchitis    hx of when smoked    Cancer ([place]) 04/16/09   kidney cancer - tx by [facility] per her report released from f/u   Cataracts, bilateral    Diverticulitis 11/2004   Endometriosis 1994   GERD (gastroesophageal reflux disease)    hx hiatal hernia, hx esophageal stricture s/p dilation   Headache    Hepatitis    History of chicken pox    History of hiatal hernia    History of measles    History of mumps as a child    Hx of hepatitis    with cirrhosis - per her report from macrodantin and eval in 2013 at [facility]   Hyperlipidemia    Left ear pain    Migraines    pt states vesticular migraines has dizziness in relation    Numbness and tingling    feet bilat has had for 40 years   Right knee pain    Scarlet fever    Shingles    Urinary tract bacterial infections    hx of   Vertigo    associated with headache, intermittent, chronic   Past Surgical History:  Procedure Laterality Date   ABDOMINAL HYSTERECTOMY  1994   TAH/BSO due to endometriosis   APPENDECTOMY     CATARACT EXTRACTION W/ INTRAOCULAR LENS IMPLANT Bilateral 05/2015, 04/2015   DIAGNOSTIC LAPAROSCOPY     LAPAROSCOPIC PARTIAL COLECTOMY N/A 04/10/2015   Procedure: LAPAROSCOPIC PARTIAL CECTOMY;  Surgeon:  Ralene Ok, MD;  Location: [facility];  Service: General;  Laterality: N/A;   LUNG BIOPSY  1996   Negative   NEPHRECTOMY  04/16/09   Partial removal due to cancer   OOPHORECTOMY     TONSILLECTOMY     TONSILLECTOMY     Patient Active Problem List   Diagnosis Date Noted   Essential hypertension 01/13/2022   Body mass index 28.0-28.9, adult 01/13/2022   Left sided abdominal pain 06/27/2021   Hyperlipidemia 11/22/2019   S/P partial resection of colon 04/10/2015   Cirrhosis ([place]) 12/08/2014   Esophageal reflux 12/08/2014   Esophageal stricture 12/08/2014   Dizziness 12/08/2014    ONSET DATE: 12/23/21  REFERRING DIAG: R42 (ICD-10-CM) - Dizziness  THERAPY DIAG:  Dizziness and giddiness  Unsteadiness on feet  Other abnormalities of gait and mobility  Rationale for Evaluation and Treatment Rehabilitation  PERTINENT HISTORY: Arthritis, Hx of Kidney Cancer, Diverticulitis, GERD, HA, Hepatitis, Migraines  PRECAUTIONS: Fall  SUBJECTIVE: Patient reports no new changes. Forget to bring her [place]. Reports she hasn't made the progress she wants to because she forgets to do the exercises. No falls to report.  PAIN:  Are you having pain? No Today's Vitals   03/14/22 1022  BP: 130/87  Pulse: 80   There is no height or weight on file to calculate BMI.   OBJECTIVE:  Motion Sensitivity Quotient Intensity: 0 = none, 1 = Lightheaded, 2 = Mild, 3 = Moderate, 4 = Severe, 5 = Vomiting  Intensity  1. Sitting to supine 0  2. Supine to L side 2  3. Supine to R side 0  4. Supine to sitting 0  5. L Hallpike-Dix 0  6. Up from L  1  7. R Hallpike-Dix 0  8. Up from R  1  9. Sitting, head  tipped to L knee 0  10. Head up from L  knee 2  11. Sitting, head  tipped to R knee 0  12. Head up from R  knee 1  13. Sitting head turns x5 3  14.Sitting head nods x5 3  15. In stance, 180  turn to L  3 (postural sway)   16. In stance, 180  turn to R 3 (postural sway)   NMR: Dynamic Visual  Acuity  Static: 9  Dynamic: 7   [facility] PT Assessment - 03/14/22 0001       Functional Gait  Assessment   Gait assessed  Yes    Gait Level Surface Walks 20 ft, slow speed, abnormal gait pattern, evidence for imbalance or deviates 10-15 in outside of the 12 in walkway width. Requires more than 7 sec to ambulate 20 ft.    Change in Gait Speed Able to change speed, demonstrates mild gait deviations, deviates 6-10 in outside of the 12 in walkway width, or no gait deviations, unable to achieve a major change in velocity, or uses a change in velocity, or uses an assistive device.    Gait with Horizontal Head Turns Performs head turns with moderate changes in gait velocity, slows down, deviates 10-15 in outside 12 in walkway width but recovers, can continue to walk.    Gait with Vertical Head Turns Performs task with moderate change in gait velocity, slows down, deviates 10-15 in outside 12 in walkway width but recovers, can continue to walk.    Gait and Pivot Turn Pivot turns safely in greater than 3 sec and stops with no loss of balance, or pivot turns safely within 3 sec and stops with mild imbalance, requires small steps to catch balance.    Step Over Obstacle Is able to step over one shoe box (4.5 in total height) but must slow down and adjust steps to clear box safely. May require verbal cueing.    Gait with Narrow Base of Support Ambulates 4-7 steps.   4 steps prior to needing CGA   Gait with Eyes Closed Walks 20 ft, slow speed, abnormal gait pattern, evidence for imbalance, deviates 10-15 in outside 12 in walkway width. Requires more than 9 sec to ambulate 20 ft.    Ambulating Backwards Walks 20 ft, slow speed, abnormal gait pattern, evidence for imbalance, deviates 10-15 in outside 12 in walkway width.    Steps Alternating feet, must use rail.    Total Score 13    FGA comment: 13/30             Updated HEP to include the following:  Bending / Picking Up Objects    Sitting, slowly bend  head down and pick up object or touch the floor. Return to upright position. Hold position until symptoms subside. Repeat 5 times per  side. Do 1 sessions per day.    PATIENT EDUCATION: Education details: Progress toward LTGs; HEP compliance/Update Person educated: Patient and Spouse Education method: Explanation Education comprehension: verbalized understanding     GOALS: Goals reviewed with patient? Yes   SHORT TERM GOALS: Target date: 02/14/2022    Pt will be independent with initial HEP for improved gaze stabilization and balance  Baseline: VOR HEP established; Independent with current HEP Goal status: MET   2.  FGA TBA and LTG to be set as applicable Baseline: TBA; Assessed on 02/11/22 Goal status: MET     LONG TERM GOALS: Target date: 03/07/2022     Pt will be independent with final HEP for improved balance and gaze stabilization  Baseline: VOR HEP established; reports understanding of HEP, no compliant with frequency Goal status: PARTIALLY MET   2.  Pt will improve FGA by 4 points from baseline to demonstrate improved balance and reduced fall risk  Baseline: TBA; 12/30; 16/30 Goal status: NOT MET   3.  Pt will improve DVA to </= 2 line difference to indicate improved VOR Baseline: 3 line difference; 2 line difference Goal status: MET   4.  Pt will report </= 2/5 for all movements on MSQ to indicate improvement in motion sensitivity and improved activity tolerance.   Baseline: 3-4; 2-3/5 on 7/21 Goal status: PARTIALLY MET  UPDATED GOALS   SHORT TERM GOALS: Target date: 04/04/2022   Pt will be independent with progressive HEP and report completing at >/= 3x/weekly for improved compliance Baseline: not compliant with HEP Goal status: REVISED     LONG TERM GOALS: Target date: 04/25/2022     Pt will be independent with final HEP for improved balance and report completion of >/= 5x/weekly to demonstrate improved compliance Baseline: VOR HEP established; reports  understanding of HEP, no compliant with frequency Goal status: REVISED   2.  Pt will improve FGA by 4 points from baseline to demonstrate improved balance and reduced fall risk  Baseline: TBA; 12/30; 16/30 Goal status: ON-GOING   3.  Pt will report </= 2/5 for all movements on MSQ to indicate improvement in motion sensitivity and improved activity tolerance.   Baseline: 3-4; 2-3/5 on 7/21 Goal status:  ON-GOING   ASSESSMENT:   CLINICAL IMPRESSION: Today's skilled PT session included assessment of patient's progress toward LTGs. Patient able to meet LTG #3, and partially meet LTG #4 and #1. Patient unable to meet LTG #2, however did improve by one point just not to goal level. PT educating on importance of compliance for progress. Patient is making steady progress with therapy and will continue to benefit from skilled PT services to progress toward LTGs.    OBJECTIVE IMPAIRMENTS decreased activity tolerance, decreased balance, difficulty walking, decreased safety awareness, dizziness, postural dysfunction, and pain.    ACTIVITY LIMITATIONS stairs, transfers, and locomotion level   PARTICIPATION LIMITATIONS: driving and community activity   PERSONAL FACTORS Age, Time since onset of injury/illness/exacerbation, and 3+ comorbidities: Arthritis, Hx of Kidney Cancer, Diverticulitis, GERD, HA, Hepatitis, Migraines  are also affecting patient's functional outcome.    REHAB POTENTIAL: Fair     CLINICAL DECISION MAKING: Evolving/moderate complexity   EVALUATION COMPLEXITY: Moderate     PLAN: PT FREQUENCY: 1x/week   PT DURATION: 7 weeks   PLANNED INTERVENTIONS: Therapeutic exercises, Therapeutic activity, Neuromuscular re-education, Balance training, Gait training, Patient/Family education, Joint mobilization, Stair training, Vestibular training, Canalith repositioning, DME instructions, Aquatic Therapy, Cryotherapy, Moist heat, and Manual therapy   PLAN FOR  NEXT SESSION: Continue Balance  and Add balance to HEP. Continue habituation, VOR and oculomotor exercises   Kirtland Bouchard, PT, DPT 03/14/2022, 11:03 AM

## 2022-03-14 NOTE — Patient Instructions (Signed)
Bending / Picking Up Objects    Sitting, slowly bend head down and pick up object or touch the floor. Return to upright position. Hold position until symptoms subside. Repeat 5 times per side. Do 1 sessions per day.  Copyright  VHI. All rights reserved.

## 2022-03-15 LAB — LIPID PANEL
Chol/HDL Ratio: 4.4 ratio (ref 0.0–4.4)
Cholesterol, Total: 200 mg/dL — ABNORMAL HIGH (ref 100–199)
HDL: 45 mg/dL (ref >39)
LDL Chol Calc (NIH): 134 mg/dL — ABNORMAL HIGH (ref 0–99)
Triglycerides: 116 mg/dL (ref 0–149)
VLDL Cholesterol Cal: 21 mg/dL (ref 5–40)

## 2022-03-18 ENCOUNTER — Ambulatory Visit: Payer: Medicare Other

## 2022-03-18 VITALS — BP 138/69 | HR 88

## 2022-03-18 DIAGNOSIS — R42 Dizziness and giddiness: Secondary | ICD-10-CM | POA: Diagnosis not present

## 2022-03-18 DIAGNOSIS — R2689 Other abnormalities of gait and mobility: Secondary | ICD-10-CM | POA: Diagnosis not present

## 2022-03-18 DIAGNOSIS — R2681 Unsteadiness on feet: Secondary | ICD-10-CM | POA: Diagnosis not present

## 2022-03-18 NOTE — Therapy (Signed)
OUTPATIENT PHYSICAL THERAPY VESTIBULAR TREATMENT NOTE   Patient Name: Brenda [name] MRN: [id number] DOB:November 15, 1945, 76 y.o., female Today's Date: 03/18/2022  PCP: Ronnell Freshwater, NP REFERRING PROVIDER:  Melvenia Beam, MD   PT End of Session - 03/18/22 1447     Visit Number 7    Number of Visits 12    Date for PT Re-Evaluation 04/25/22    Authorization Type Medicare A+B/BCBS    Progress Note Due on Visit 10    PT Start Time 6387    PT Stop Time 1528    PT Time Calculation (min) 41 min    Activity Tolerance Patient tolerated treatment well    Behavior During Therapy [facility] for tasks assessed/performed               Past Medical History:  Diagnosis Date   Arthritis    Bronchitis    hx of when smoked    Cancer ([place]) 04/16/09   kidney cancer - tx by [facility] per her report released from f/u   Cataracts, bilateral    Diverticulitis 11/2004   Endometriosis 1994   GERD (gastroesophageal reflux disease)    hx hiatal hernia, hx esophageal stricture s/p dilation   Headache    Hepatitis    History of chicken pox    History of hiatal hernia    History of measles    History of mumps as a child    Hx of hepatitis    with cirrhosis - per her report from macrodantin and eval in 2013 at [facility]   Hyperlipidemia    Left ear pain    Migraines    pt states vesticular migraines has dizziness in relation    Numbness and tingling    feet bilat has had for 40 years   Right knee pain    Scarlet fever    Shingles    Urinary tract bacterial infections    hx of   Vertigo    associated with headache, intermittent, chronic   Past Surgical History:  Procedure Laterality Date   ABDOMINAL HYSTERECTOMY  1994   TAH/BSO due to endometriosis   APPENDECTOMY     CATARACT EXTRACTION W/ INTRAOCULAR LENS IMPLANT Bilateral 05/2015, 04/2015   DIAGNOSTIC LAPAROSCOPY     LAPAROSCOPIC PARTIAL COLECTOMY N/A 04/10/2015   Procedure: LAPAROSCOPIC PARTIAL CECTOMY;  Surgeon: Ralene Ok,  MD;  Location: [facility];  Service: General;  Laterality: N/A;   LUNG BIOPSY  1996   Negative   NEPHRECTOMY  04/16/09   Partial removal due to cancer   OOPHORECTOMY     TONSILLECTOMY     TONSILLECTOMY     Patient Active Problem List   Diagnosis Date Noted   Essential hypertension 01/13/2022   Body mass index 28.0-28.9, adult 01/13/2022   Left sided abdominal pain 06/27/2021   Hyperlipidemia 11/22/2019   S/P partial resection of colon 04/10/2015   Cirrhosis ([place]) 12/08/2014   Esophageal reflux 12/08/2014   Esophageal stricture 12/08/2014   Dizziness 12/08/2014    ONSET DATE: 12/23/21  REFERRING DIAG: R42 (ICD-10-CM) - Dizziness  THERAPY DIAG:  Dizziness and giddiness  Unsteadiness on feet  Other abnormalities of gait and mobility  Rationale for Evaluation and Treatment Rehabilitation  PERTINENT HISTORY: Arthritis, Hx of Kidney Cancer, Diverticulitis, GERD, HA, Hepatitis, Migraines  PRECAUTIONS: Fall  SUBJECTIVE: Reports lab draw went well. No other new changes/complaints. Has misplaced cane, is using a friends. She has borrowed one. No falls. Reports dizziness has been good, but report  dizziness started before she got here.   PAIN:  Are you having pain? No  Today's Vitals   03/18/22 1455  BP: 138/69  Pulse: 88   There is no height or weight on file to calculate BMI.   OBJECTIVE: TODAY'S TREATMENT:  Habituation: Forward Bending/Reaching to the Floor: completed seated with bending to targets on bilateral sides x 8 reps, mild dizziness. Progressed to standing and again completed bending to target x 8 reps bilaterally. Mild dizziness.   NMR:  Standing Balance: Surface: Airex Position: Feet Hip Width Apart Completed with: Eyes Open; Head Turns x 10 Reps and Head Nods x 10 Reps; Added Horizontal Head turns with EO to HEP. CGA  Standing on airex with feet hip width apart, completed eyes closed 3 x 30 seconds, mild postural sway.   Then on airex, completed eyes  closed with addition of horizontal/vertical head turns x 10 reps, with light touch to chair for support. Mild dizziness reported with horizontal > vertical head turn.  High Level Balance: Completed ambulation with horizontal head turns 2 x 25'. Slowed pace for head turns to promote improved stability. Then completed tandem gait forwards, completed x 4 x 25'. Intermittent CGA and use of wall for support as needed.  Added balance to HEP:  Access Code: [email or web address] URL: [email or web address] Date: 03/18/2022 Prepared by: Baldomero Lamy  Exercises - Standing Balance with Eyes Closed on Foam  - 1 x daily - 5 x weekly - 1 sets - 3 reps - 30 seconds hold - Standing on Foam Pad with Head Rotation  - 1 x daily - 5 x weekly - 2 sets - 10 reps    PATIENT EDUCATION: Education details: HEP Update Person educated: Patient and Spouse Education method: Explanation Education comprehension: verbalized understanding     GOALS: Goals reviewed with patient? Yes  UPDATED GOALS   SHORT TERM GOALS: Target date: 04/04/2022   Pt will be independent with progressive HEP and report completing at >/= 3x/weekly for improved compliance Baseline: not compliant with HEP Goal status: REVISED     LONG TERM GOALS: Target date: 04/25/2022     Pt will be independent with final HEP for improved balance and report completion of >/= 5x/weekly to demonstrate improved compliance Baseline: VOR HEP established; reports understanding of HEP, no compliant with frequency Goal status: REVISED   2.  Pt will improve FGA by 4 points from baseline to demonstrate improved balance and reduced fall risk  Baseline: TBA; 12/30; 16/30 Goal status: ON-GOING   3.  Pt will report </= 2/5 for all movements on MSQ to indicate improvement in motion sensitivity and improved activity tolerance.   Baseline: 3-4; 2-3/5 on 7/21 Goal status:  ON-GOING   ASSESSMENT:   CLINICAL IMPRESSION: Today's skilled PT session focused on  continued habituation training, and balance training. Added balance activities to HEP with patient tolerating well. Will continue per POC   OBJECTIVE IMPAIRMENTS decreased activity tolerance, decreased balance, difficulty walking, decreased safety awareness, dizziness, postural dysfunction, and pain.    ACTIVITY LIMITATIONS stairs, transfers, and locomotion level   PARTICIPATION LIMITATIONS: driving and community activity   PERSONAL FACTORS Age, Time since onset of injury/illness/exacerbation, and 3+ comorbidities: Arthritis, Hx of Kidney Cancer, Diverticulitis, GERD, HA, Hepatitis, Migraines  are also affecting patient's functional outcome.    REHAB POTENTIAL: Fair     CLINICAL DECISION MAKING: Evolving/moderate complexity   EVALUATION COMPLEXITY: Moderate     PLAN: PT FREQUENCY: 1x/week   PT DURATION: 7 weeks  PLANNED INTERVENTIONS: Therapeutic exercises, Therapeutic activity, Neuromuscular re-education, Balance training, Gait training, Patient/Family education, Joint mobilization, Stair training, Vestibular training, Canalith repositioning, DME instructions, Aquatic Therapy, Cryotherapy, Moist heat, and Manual therapy   PLAN FOR NEXT SESSION: Continue Balance. Continue habituation, VOR and oculomotor exercises   Kirtland Bouchard, PT, DPT 03/18/2022, 3:46 PM

## 2022-03-25 ENCOUNTER — Ambulatory Visit: Payer: Medicare Other | Attending: Neurology

## 2022-03-25 VITALS — BP 148/79 | HR 80

## 2022-03-25 DIAGNOSIS — R2689 Other abnormalities of gait and mobility: Secondary | ICD-10-CM

## 2022-03-25 DIAGNOSIS — R2681 Unsteadiness on feet: Secondary | ICD-10-CM

## 2022-03-25 DIAGNOSIS — R42 Dizziness and giddiness: Secondary | ICD-10-CM

## 2022-03-25 NOTE — Therapy (Signed)
OUTPATIENT PHYSICAL THERAPY VESTIBULAR TREATMENT NOTE   Patient Name: Brenda [name] MRN: [id number] DOB:March 16, 1946, 76 y.o., female Today's Date: 03/25/2022  PCP: Ronnell Freshwater, NP REFERRING PROVIDER:  Melvenia Beam, MD   PT End of Session - 03/25/22 1449     Visit Number 8    Number of Visits 12    Date for PT Re-Evaluation 04/25/22    Authorization Type Medicare A+B/BCBS    Progress Note Due on Visit 10    PT Start Time 5361    PT Stop Time 1528    PT Time Calculation (min) 41 min    Activity Tolerance Patient tolerated treatment well    Behavior During Therapy [facility] for tasks assessed/performed                Past Medical History:  Diagnosis Date   Arthritis    Bronchitis    hx of when smoked    Cancer ([place]) 04/16/09   kidney cancer - tx by [facility] per her report released from f/u   Cataracts, bilateral    Diverticulitis 11/2004   Endometriosis 1994   GERD (gastroesophageal reflux disease)    hx hiatal hernia, hx esophageal stricture s/p dilation   Headache    Hepatitis    History of chicken pox    History of hiatal hernia    History of measles    History of mumps as a child    Hx of hepatitis    with cirrhosis - per her report from macrodantin and eval in 2013 at [facility]   Hyperlipidemia    Left ear pain    Migraines    pt states vesticular migraines has dizziness in relation    Numbness and tingling    feet bilat has had for 40 years   Right knee pain    Scarlet fever    Shingles    Urinary tract bacterial infections    hx of   Vertigo    associated with headache, intermittent, chronic   Past Surgical History:  Procedure Laterality Date   ABDOMINAL HYSTERECTOMY  1994   TAH/BSO due to endometriosis   APPENDECTOMY     CATARACT EXTRACTION W/ INTRAOCULAR LENS IMPLANT Bilateral 05/2015, 04/2015   DIAGNOSTIC LAPAROSCOPY     LAPAROSCOPIC PARTIAL COLECTOMY N/A 04/10/2015   Procedure: LAPAROSCOPIC PARTIAL CECTOMY;  Surgeon: Ralene Ok, MD;  Location: [facility];  Service: General;  Laterality: N/A;   LUNG BIOPSY  1996   Negative   NEPHRECTOMY  04/16/09   Partial removal due to cancer   OOPHORECTOMY     TONSILLECTOMY     TONSILLECTOMY     Patient Active Problem List   Diagnosis Date Noted   Essential hypertension 01/13/2022   Body mass index 28.0-28.9, adult 01/13/2022   Left sided abdominal pain 06/27/2021   Hyperlipidemia 11/22/2019   S/P partial resection of colon 04/10/2015   Cirrhosis ([place]) 12/08/2014   Esophageal reflux 12/08/2014   Esophageal stricture 12/08/2014   Dizziness 12/08/2014    ONSET DATE: 12/23/21  REFERRING DIAG: R42 (ICD-10-CM) - Dizziness  THERAPY DIAG:  Dizziness and giddiness  Unsteadiness on feet  Other abnormalities of gait and mobility  Rationale for Evaluation and Treatment Rehabilitation  PERTINENT HISTORY: Arthritis, Hx of Kidney Cancer, Diverticulitis, GERD, HA, Hepatitis, Migraines  PRECAUTIONS: Fall  SUBJECTIVE: Patient reports no new changes/complaints. No falls to report. Dizziness has been better, minimal dizziness. Balance feels about the same, still using SPC.   PAIN:  Are  you having pain? No  Today's Vitals   03/25/22 1452  BP: (!) 148/79  Pulse: 80   There is no height or weight on file to calculate BMI.   OBJECTIVE: TODAY'S TREATMENT:  TherEx:  Completed SciFit with BLE/BUE on Level 3.0 x 5 minutes for improved activity tolerance/endurance. Patient tolerating well.    NMR:  Standing Balance: Surface: Airex Position: Feet Hip Width Apart Completed with: Eyes Open; Head Turns x 10 Reps; Minimal sway Standing on airex with feet hip width apart, completed eyes closed 3 x 30 seconds.  Then on airex, completed alternating stepping strategy forwards without UE support x 10 reps bilat, then progressed and adding in horizontal head turns x 10 reps bilat with addition of stepping strategy, more balance challenge requiring cues for sequence and  intermittent CGA due to sway with head movement.   On Rockerboard positioned A/P: standing maintaining board steady 2 x 30 seconds with eyes open and no UE support. Then with eyes open added in horizontal/vertical head turns. More challenge with vertical head movement > horizontal. Completed A/P weight shift x 15 reps without UE support working toward improved limits of stability and weight shift.   Then standing on rockerboard A/P completed alternating toe tap to cones without UE support x 15 reps bilaterally forward, then completed crossover toe taps x 15 reps bilaterally.   PATIENT EDUCATION: Education details: Continue HEP Person educated: Patient and Spouse Education method: Explanation Education comprehension: verbalized understanding  HOME EDUCATION PROGRAM:  Access Code: [email or web address]   GOALS: Goals reviewed with patient? Yes  UPDATED GOALS   SHORT TERM GOALS: Target date: 04/04/2022   Pt will be independent with progressive HEP and report completing at >/= 3x/weekly for improved compliance Baseline: not compliant with HEP Goal status: REVISED     LONG TERM GOALS: Target date: 04/25/2022     Pt will be independent with final HEP for improved balance and report completion of >/= 5x/weekly to demonstrate improved compliance Baseline: VOR HEP established; reports understanding of HEP, no compliant with frequency Goal status: REVISED   2.  Pt will improve FGA by 4 points from baseline to demonstrate improved balance and reduced fall risk  Baseline: TBA; 12/30; 16/30 Goal status: ON-GOING   3.  Pt will report </= 2/5 for all movements on MSQ to indicate improvement in motion sensitivity and improved activity tolerance.   Baseline: 3-4; 2-3/5 on 7/21 Goal status:  ON-GOING   ASSESSMENT:   CLINICAL IMPRESSION: Today's skilled PT session focused on continued balance training and activity tolernace with patient tolerating well. Continue to report improvements and minimal dizziness,  therefore continued focus on balance activities to patients tolerance. Will continue per POC.   OBJECTIVE IMPAIRMENTS decreased activity tolerance, decreased balance, difficulty walking, decreased safety awareness, dizziness, postural dysfunction, and pain.    ACTIVITY LIMITATIONS stairs, transfers, and locomotion level   PARTICIPATION LIMITATIONS: driving and community activity   PERSONAL FACTORS Age, Time since onset of injury/illness/exacerbation, and 3+ comorbidities: Arthritis, Hx of Kidney Cancer, Diverticulitis, GERD, HA, Hepatitis, Migraines  are also affecting patient's functional outcome.    REHAB POTENTIAL: Fair     CLINICAL DECISION MAKING: Evolving/moderate complexity   EVALUATION COMPLEXITY: Moderate     PLAN: PT FREQUENCY: 1x/week   PT DURATION: 7 weeks   PLANNED INTERVENTIONS: Therapeutic exercises, Therapeutic activity, Neuromuscular re-education, Balance training, Gait training, Patient/Family education, Joint mobilization, Stair training, Vestibular training, Canalith repositioning, DME instructions, Aquatic Therapy, Cryotherapy, Moist heat, and Manual therapy  PLAN FOR NEXT SESSION: Check STG. Continue Balance. Continue habituation, VOR and oculomotor exercises   Kirtland Bouchard, PT, DPT 03/25/2022, 3:30 PM

## 2022-03-28 ENCOUNTER — Encounter: Payer: Self-pay | Admitting: Obstetrics and Gynecology

## 2022-03-28 NOTE — Telephone Encounter (Signed)
Needs office visit.

## 2022-04-08 ENCOUNTER — Ambulatory Visit: Payer: Medicare Other

## 2022-04-08 VITALS — BP 128/80 | HR 86

## 2022-04-08 DIAGNOSIS — R2689 Other abnormalities of gait and mobility: Secondary | ICD-10-CM | POA: Diagnosis not present

## 2022-04-08 DIAGNOSIS — R2681 Unsteadiness on feet: Secondary | ICD-10-CM | POA: Diagnosis not present

## 2022-04-08 DIAGNOSIS — R42 Dizziness and giddiness: Secondary | ICD-10-CM

## 2022-04-08 NOTE — Therapy (Signed)
OUTPATIENT PHYSICAL THERAPY VESTIBULAR TREATMENT NOTE/PROGRESS NOTE   Patient Name: Brenda [name] MRN: [id number] DOB:1945/10/06, 76 y.o., female Today's Date: 04/08/2022  PCP: Ronnell Freshwater, NP REFERRING PROVIDER:  Melvenia Beam, MD  Physical Therapy Progress Note   Dates of Reporting Period: 01/22/22 - 04/08/22  See Note below for Objective Data and Assessment of Progress/Goals.  Thank you for the referral of this patient. Guillermina City, PT, DPT    PT End of Session - 04/08/22 1445     Visit Number 9    Number of Visits 12    Date for PT Re-Evaluation 04/25/22    Authorization Type Medicare A+B/BCBS    Progress Note Due on Visit 10    PT Start Time 3557    PT Stop Time 1528    PT Time Calculation (min) 43 min    Activity Tolerance Patient tolerated treatment well    Behavior During Therapy WFL for tasks assessed/performed            Past Medical History:  Diagnosis Date   Arthritis    Bronchitis    hx of when smoked    Cancer ([place]) 04/16/09   kidney cancer - tx by [facility] per her report released from f/u   Cataracts, bilateral    Diverticulitis 11/2004   Endometriosis 1994   GERD (gastroesophageal reflux disease)    hx hiatal hernia, hx esophageal stricture s/p dilation   Headache    Hepatitis    History of chicken pox    History of hiatal hernia    History of measles    History of mumps as a child    Hx of hepatitis    with cirrhosis - per her report from macrodantin and eval in 2013 at [facility]   Hyperlipidemia    Left ear pain    Migraines    pt states vesticular migraines has dizziness in relation    Numbness and tingling    feet bilat has had for 40 years   Right knee pain    Scarlet fever    Shingles    Urinary tract bacterial infections    hx of   Vertigo    associated with headache, intermittent, chronic   Past Surgical History:  Procedure Laterality Date   ABDOMINAL HYSTERECTOMY  1994   TAH/BSO due to endometriosis    APPENDECTOMY     CATARACT EXTRACTION W/ INTRAOCULAR LENS IMPLANT Bilateral 05/2015, 04/2015   DIAGNOSTIC LAPAROSCOPY     LAPAROSCOPIC PARTIAL COLECTOMY N/A 04/10/2015   Procedure: LAPAROSCOPIC PARTIAL CECTOMY;  Surgeon: Ralene Ok, MD;  Location: [facility];  Service: General;  Laterality: N/A;   LUNG BIOPSY  1996   Negative   NEPHRECTOMY  04/16/09   Partial removal due to cancer   OOPHORECTOMY     TONSILLECTOMY     TONSILLECTOMY     Patient Active Problem List   Diagnosis Date Noted   Essential hypertension 01/13/2022   Body mass index 28.0-28.9, adult 01/13/2022   Left sided abdominal pain 06/27/2021   Hyperlipidemia 11/22/2019   S/P partial resection of colon 04/10/2015   Cirrhosis ([place]) 12/08/2014   Esophageal reflux 12/08/2014   Esophageal stricture 12/08/2014   Dizziness 12/08/2014    ONSET DATE: 12/23/21  REFERRING DIAG: R42 (ICD-10-CM) - Dizziness  THERAPY DIAG:  Dizziness and giddiness  Unsteadiness on feet  Other abnormalities of gait and mobility  Rationale for Evaluation and Treatment Rehabilitation  PERTINENT HISTORY: Arthritis, Hx of Kidney Cancer, Diverticulitis, GERD,  HA, Hepatitis, Migraines  PRECAUTIONS: Fall  SUBJECTIVE: Pt reports no new changes/complaints. No falls. Using the [facility] today. Reports that she has experienced some days of dizziness. Not the best today.   PAIN:  Are you having pain? No  Today's Vitals   04/08/22 1450  BP: 128/80  Pulse: 86   There is no height or weight on file to calculate BMI.   OBJECTIVE: TODAY'S TREATMENT:  TherEx: Completed SciFit with BLE/BUE on Level 4.5 x 5 minutes for improved activity tolerance/endurance. Able to maintain steps per minute >/= 80. Patient tolerating increase in resistance well.   NMR:  Reviewed HEP with patient, patient continues to demonstrate decreased compliance with HEP. Reports only completing 2x/weekly, reports she forgets to do the activities despite PT educating on ways to  promote compliance. PT re-provided exercise tracker for patient, that way she can check off days complete to give her a mental reminder of when she has completed the exercises.   Standing Balance: On Rockerboard positioned A/P: standing maintaining board steady x 30 seconds with eyes open and no UE support. Then with eyes open added in horizontal/vertical head turns, x 10 reps each. More challenge with vertical head movement > horizontal. Cues to just isolate cervical movement > full body movement. Completed standing maintaining board steady, completed eyes closed 3 x 30 seconds with CGA.   Tandem Gait on Blue Balance Beam: Completed forward tandem x 5 laps down and back progressing from single UE support to no UE support.   Habituation:    Completed forward ambulation with 180 deg turns x 5 reps each direction. Mild Dizziness. More noticeable with increased speed of turn. CGA  PATIENT EDUCATION: Education details: Continue HEP; Compliance with HEP; Exercise Tracker  Person educated: Patient and Spouse Education method: Explanation Education comprehension: verbalized understanding  HOME EDUCATION PROGRAM:  Access Code: [email or web address]   GOALS: Goals reviewed with patient? Yes  UPDATED GOALS   SHORT TERM GOALS: Target date: 04/04/2022   Pt will be independent with progressive HEP and report completing at >/= 3x/weekly for improved compliance Baseline: not compliant with HEP; completed 2x/weekly Goal status: PARTIALLY MET     LONG TERM GOALS: Target date: 04/25/2022     Pt will be independent with final HEP for improved balance and report completion of >/= 5x/weekly to demonstrate improved compliance Baseline: VOR HEP established; reports understanding of HEP, no compliant with frequency Goal status: REVISED   2.  Pt will improve FGA by 4 points from baseline to demonstrate improved balance and reduced fall risk  Baseline: TBA; 12/30; 16/30 Goal status: ON-GOING   3.  Pt will report </=  2/5 for all movements on MSQ to indicate improvement in motion sensitivity and improved activity tolerance.   Baseline: 3-4; 2-3/5 on 7/21 Goal status:  ON-GOING   ASSESSMENT:   CLINICAL IMPRESSION: Today's skilled PT session included assessment of patient's progress toward STGs. Patient able to partially meet STG. Patient is making steady progress with balance, however having difficulty being compliance with HEP. Will continue to benefit from skilled PT services to progress toward LTGs.    OBJECTIVE IMPAIRMENTS decreased activity tolerance, decreased balance, difficulty walking, decreased safety awareness, dizziness, postural dysfunction, and pain.    ACTIVITY LIMITATIONS stairs, transfers, and locomotion level   PARTICIPATION LIMITATIONS: driving and community activity   PERSONAL FACTORS Age, Time since onset of injury/illness/exacerbation, and 3+ comorbidities: Arthritis, Hx of Kidney Cancer, Diverticulitis, GERD, HA, Hepatitis, Migraines  are also affecting patient's functional outcome.  REHAB POTENTIAL: Fair     CLINICAL DECISION MAKING: Evolving/moderate complexity   EVALUATION COMPLEXITY: Moderate     PLAN: PT FREQUENCY: 1x/week   PT DURATION: 7 weeks   PLANNED INTERVENTIONS: Therapeutic exercises, Therapeutic activity, Neuromuscular re-education, Balance training, Gait training, Patient/Family education, Joint mobilization, Stair training, Vestibular training, Canalith repositioning, DME instructions, Aquatic Therapy, Cryotherapy, Moist heat, and Manual therapy   PLAN FOR NEXT SESSION: Did we do exercises? Continue Balance. Continue habituation, VOR and oculomotor exercises   Kirtland Bouchard, PT, DPT 04/08/2022, 3:30 PM

## 2022-04-10 ENCOUNTER — Ambulatory Visit: Payer: Medicare Other | Admitting: Neurology

## 2022-04-14 ENCOUNTER — Ambulatory Visit: Payer: Medicare Other

## 2022-04-14 DIAGNOSIS — R42 Dizziness and giddiness: Secondary | ICD-10-CM | POA: Diagnosis not present

## 2022-04-14 DIAGNOSIS — R2689 Other abnormalities of gait and mobility: Secondary | ICD-10-CM

## 2022-04-14 DIAGNOSIS — R2681 Unsteadiness on feet: Secondary | ICD-10-CM | POA: Diagnosis not present

## 2022-04-14 NOTE — Therapy (Signed)
OUTPATIENT PHYSICAL THERAPY VESTIBULAR TREATMENT NOTE   Patient Name: Brenda [name] MRN: [id number] DOB:02-15-1946, 76 y.o., female Today's Date: 04/14/2022  PCP: Ronnell Freshwater, NP REFERRING PROVIDER:  Melvenia Beam, MD   PT End of Session - 04/14/22 1319     Visit Number 10    Number of Visits 12    Date for PT Re-Evaluation 04/25/22    Authorization Type Medicare A+B/BCBS    Progress Note Due on Visit 10    PT Start Time 1315    PT Stop Time 1448    PT Time Calculation (min) 43 min    Activity Tolerance Patient tolerated treatment well    Behavior During Therapy WFL for tasks assessed/performed             Past Medical History:  Diagnosis Date   Arthritis    Bronchitis    hx of when smoked    Cancer ([place]) 04/16/09   kidney cancer - tx by [facility] per her report released from f/u   Cataracts, bilateral    Diverticulitis 11/2004   Endometriosis 1994   GERD (gastroesophageal reflux disease)    hx hiatal hernia, hx esophageal stricture s/p dilation   Headache    Hepatitis    History of chicken pox    History of hiatal hernia    History of measles    History of mumps as a child    Hx of hepatitis    with cirrhosis - per her report from macrodantin and eval in 2013 at [facility]   Hyperlipidemia    Left ear pain    Migraines    pt states vesticular migraines has dizziness in relation    Numbness and tingling    feet bilat has had for 40 years   Right knee pain    Scarlet fever    Shingles    Urinary tract bacterial infections    hx of   Vertigo    associated with headache, intermittent, chronic   Past Surgical History:  Procedure Laterality Date   ABDOMINAL HYSTERECTOMY  1994   TAH/BSO due to endometriosis   APPENDECTOMY     CATARACT EXTRACTION W/ INTRAOCULAR LENS IMPLANT Bilateral 05/2015, 04/2015   DIAGNOSTIC LAPAROSCOPY     LAPAROSCOPIC PARTIAL COLECTOMY N/A 04/10/2015   Procedure: LAPAROSCOPIC PARTIAL CECTOMY;  Surgeon: Ralene Ok,  MD;  Location: [facility];  Service: General;  Laterality: N/A;   LUNG BIOPSY  1996   Negative   NEPHRECTOMY  04/16/09   Partial removal due to cancer   OOPHORECTOMY     TONSILLECTOMY     TONSILLECTOMY     Patient Active Problem List   Diagnosis Date Noted   Essential hypertension 01/13/2022   Body mass index 28.0-28.9, adult 01/13/2022   Left sided abdominal pain 06/27/2021   Hyperlipidemia 11/22/2019   S/P partial resection of colon 04/10/2015   Cirrhosis ([place]) 12/08/2014   Esophageal reflux 12/08/2014   Esophageal stricture 12/08/2014   Dizziness 12/08/2014    ONSET DATE: 12/23/21  REFERRING DIAG: R42 (ICD-10-CM) - Dizziness  THERAPY DIAG:  Dizziness and giddiness  Unsteadiness on feet  Other abnormalities of gait and mobility  Rationale for Evaluation and Treatment Rehabilitation  PERTINENT HISTORY: Arthritis, Hx of Kidney Cancer, Diverticulitis, GERD, HA, Hepatitis, Migraines  PRECAUTIONS: Fall  SUBJECTIVE: Right knee has been sore, not sure why. No falls.  Completed the exercises two days last week, reports she forgets despite the tracker provided.   PAIN:  Are you  having pain? Yes: NPRS scale: 5/10 Pain location: R Knee Pain description: Achy;Sharp  There were no vitals filed for this visit.  There is no height or weight on file to calculate BMI.   OBJECTIVE: TODAY'S TREATMENT:  TherEx: Completed NuStep with BLE/BUE on Level 3 x 5 minutes for improved activity tolerance/endurance. Slower pace noted to discomfort in the R knee. No increase in pain reported  PT assessed knee due to discomfort, but no swelling noted. Did have some mild increase in temperature to touch noted. PT educating on ice application to help with discomfort.   NMR: Standing Balance: Surface:  thick blue dense foam Position: Feet Hip Width Apart Completed with: Eyes Open; Head Turns x 10 Reps and Head Nods x 10 Reps; No Dizziness reported. Then completed with eyes open and maintaining  steady x 30 seconds.   Tandem Stance:  Surface:  thick blue dense foam Completed with: Eyes Open;  Time: 2 x 30 seconds each; alternating foot position    Standing on Thick Blue Dense Foam: Completed alternating toe taps forwards to cones x 20 reps bilat forward, progressing from single UE support to no UE support. Pt tolerating activities well without UE support, Close supervision.   Obstacle Negotiation: Completed step over hurdles x 3 laps down and back without UE support, then progressing to reciprocal stepping over hurdles x 3 laps down and back. No Use of SPC with this activity       PATIENT EDUCATION: Education details: Continue HEP  Person educated: Patient and Spouse Education method: Explanation Education comprehension: verbalized understanding  HOME EDUCATION PROGRAM:  Access Code: [email or web address]   GOALS: Goals reviewed with patient? Yes  UPDATED GOALS   SHORT TERM GOALS: Target date: 04/04/2022   Pt will be independent with progressive HEP and report completing at >/= 3x/weekly for improved compliance Baseline: not compliant with HEP; completed 2x/weekly Goal status: PARTIALLY MET     LONG TERM GOALS: Target date: 04/25/2022     Pt will be independent with final HEP for improved balance and report completion of >/= 5x/weekly to demonstrate improved compliance Baseline: VOR HEP established; reports understanding of HEP, no compliant with frequency Goal status: REVISED   2.  Pt will improve FGA by 4 points from baseline to demonstrate improved balance and reduced fall risk  Baseline: TBA; 12/30; 16/30 Goal status: ON-GOING   3.  Pt will report </= 2/5 for all movements on MSQ to indicate improvement in motion sensitivity and improved activity tolerance.   Baseline: 3-4; 2-3/5 on 7/21 Goal status:  ON-GOING   ASSESSMENT:   CLINICAL IMPRESSION: Today's skilled PT session focused on continued activity tolerance/endurance, decreased time on NuStep due to increased  discomfort in R knee. Continued NMR focused on standing balance, patient tolerating well. Will plan to assess progress toward goals at next visit and determine POC going forward, patient agreeable.    OBJECTIVE IMPAIRMENTS decreased activity tolerance, decreased balance, difficulty walking, decreased safety awareness, dizziness, postural dysfunction, and pain.    ACTIVITY LIMITATIONS stairs, transfers, and locomotion level   PARTICIPATION LIMITATIONS: driving and community activity   PERSONAL FACTORS Age, Time since onset of injury/illness/exacerbation, and 3+ comorbidities: Arthritis, Hx of Kidney Cancer, Diverticulitis, GERD, HA, Hepatitis, Migraines  are also affecting patient's functional outcome.    REHAB POTENTIAL: Fair     CLINICAL DECISION MAKING: Evolving/moderate complexity   EVALUATION COMPLEXITY: Moderate     PLAN: PT FREQUENCY: 1x/week   PT DURATION: 7 weeks   PLANNED INTERVENTIONS:  Therapeutic exercises, Therapeutic activity, Neuromuscular re-education, Balance training, Gait training, Patient/Family education, Joint mobilization, Stair training, Vestibular training, Canalith repositioning, DME instructions, Aquatic Therapy, Cryotherapy, Moist heat, and Manual therapy   PLAN FOR NEXT SESSION: Plan to check LTGs + D/C or Re-Cert with [facility].    Kirtland Bouchard, PT, DPT 04/14/2022, 2:00 PM

## 2022-04-19 ENCOUNTER — Encounter: Payer: Self-pay | Admitting: Obstetrics and Gynecology

## 2022-04-19 DIAGNOSIS — N760 Acute vaginitis: Secondary | ICD-10-CM

## 2022-04-21 ENCOUNTER — Ambulatory Visit: Payer: Medicare Other

## 2022-04-21 VITALS — BP 148/62 | HR 86

## 2022-04-21 DIAGNOSIS — R2689 Other abnormalities of gait and mobility: Secondary | ICD-10-CM | POA: Diagnosis not present

## 2022-04-21 DIAGNOSIS — R42 Dizziness and giddiness: Secondary | ICD-10-CM | POA: Diagnosis not present

## 2022-04-21 DIAGNOSIS — R2681 Unsteadiness on feet: Secondary | ICD-10-CM | POA: Diagnosis not present

## 2022-04-21 NOTE — Therapy (Signed)
OUTPATIENT PHYSICAL THERAPY VESTIBULAR TREATMENT NOTE/DISCHARGE SUMMARY   Patient Name: Brenda [name] MRN: [id number] DOB:10/25/1945, 76 y.o., female Today's Date: 04/21/2022  PCP: Ronnell Freshwater, NP REFERRING PROVIDER:  Melvenia Beam, MD  PHYSICAL THERAPY DISCHARGE SUMMARY  Visits from Start of Care: 11  Current functional level related to goals / functional outcomes: See Clinical Impression Statement   Remaining deficits: Mild Dizziness, Mild Imbalance   Education / Equipment: HEP + HEP Tracker to promote compliance   Patient agrees to discharge. Patient goals were partially met. Patient is being discharged due to meeting the stated rehab goals.    PT End of Session - 04/21/22 1319     Visit Number 11    Number of Visits 12    Date for PT Re-Evaluation 04/25/22    Authorization Type Medicare A+B/BCBS    Progress Note Due on Visit 10    PT Start Time 1315    PT Stop Time 1350    PT Time Calculation (min) 35 min    Activity Tolerance Patient tolerated treatment well    Behavior During Therapy WFL for tasks assessed/performed              Past Medical History:  Diagnosis Date   Arthritis    Bronchitis    hx of when smoked    Cancer ([place]) 04/16/09   kidney cancer - tx by [facility] per her report released from f/u   Cataracts, bilateral    Diverticulitis 11/2004   Endometriosis 1994   GERD (gastroesophageal reflux disease)    hx hiatal hernia, hx esophageal stricture s/p dilation   Headache    Hepatitis    History of chicken pox    History of hiatal hernia    History of measles    History of mumps as a child    Hx of hepatitis    with cirrhosis - per her report from macrodantin and eval in 2013 at [facility]   Hyperlipidemia    Left ear pain    Migraines    pt states vesticular migraines has dizziness in relation    Numbness and tingling    feet bilat has had for 40 years   Right knee pain    Scarlet fever    Shingles    Urinary tract  bacterial infections    hx of   Vertigo    associated with headache, intermittent, chronic   Past Surgical History:  Procedure Laterality Date   ABDOMINAL HYSTERECTOMY  1994   TAH/BSO due to endometriosis   APPENDECTOMY     CATARACT EXTRACTION W/ INTRAOCULAR LENS IMPLANT Bilateral 05/2015, 04/2015   DIAGNOSTIC LAPAROSCOPY     LAPAROSCOPIC PARTIAL COLECTOMY N/A 04/10/2015   Procedure: LAPAROSCOPIC PARTIAL CECTOMY;  Surgeon: Ralene Ok, MD;  Location: [facility];  Service: General;  Laterality: N/A;   LUNG BIOPSY  1996   Negative   NEPHRECTOMY  04/16/09   Partial removal due to cancer   OOPHORECTOMY     TONSILLECTOMY     TONSILLECTOMY     Patient Active Problem List   Diagnosis Date Noted   Essential hypertension 01/13/2022   Body mass index 28.0-28.9, adult 01/13/2022   Left sided abdominal pain 06/27/2021   Hyperlipidemia 11/22/2019   S/P partial resection of colon 04/10/2015   Cirrhosis ([place]) 12/08/2014   Esophageal reflux 12/08/2014   Esophageal stricture 12/08/2014   Dizziness 12/08/2014    ONSET DATE: 12/23/21  REFERRING DIAG: R42 (ICD-10-CM) - Dizziness  THERAPY DIAG:  Dizziness and giddiness  Unsteadiness on feet  Other abnormalities of gait and mobility  Rationale for Evaluation and Treatment Rehabilitation  PERTINENT HISTORY: Arthritis, Hx of Kidney Cancer, Diverticulitis, GERD, HA, Hepatitis, Migraines  PRECAUTIONS: Fall  SUBJECTIVE: Reports some pressure in the head, reports it is due to weather changes. R knee seems to be doing better. No falls. Mild dizziness at baseline.  PAIN:  Are you having pain? Yes: NPRS scale: 5/10 Pain location: Front Of Head Pain description: Pressure/Head  Today's Vitals   04/21/22 1320  BP: (!) 148/62  Pulse: 86    There is no height or weight on file to calculate BMI.   OBJECTIVE: TODAY'S TREATMENT:   [facility] PT Assessment - 04/21/22 0001       Functional Gait  Assessment   Gait assessed  Yes    Gait Level  Surface Walks 20 ft in less than 7 sec but greater than 5.5 sec, uses assistive device, slower speed, mild gait deviations, or deviates 6-10 in outside of the 12 in walkway width.    Change in Gait Speed Able to change speed, demonstrates mild gait deviations, deviates 6-10 in outside of the 12 in walkway width, or no gait deviations, unable to achieve a major change in velocity, or uses a change in velocity, or uses an assistive device.    Gait with Horizontal Head Turns Performs head turns smoothly with slight change in gait velocity (eg, minor disruption to smooth gait path), deviates 6-10 in outside 12 in walkway width, or uses an assistive device.    Gait with Vertical Head Turns Performs task with moderate change in gait velocity, slows down, deviates 10-15 in outside 12 in walkway width but recovers, can continue to walk.    Gait and Pivot Turn Pivot turns safely within 3 sec and stops quickly with no loss of balance.    Step Over Obstacle Is able to step over one shoe box (4.5 in total height) but must slow down and adjust steps to clear box safely. May require verbal cueing.    Gait with Narrow Base of Support Ambulates 4-7 steps.    Gait with Eyes Closed Walks 20 ft, slow speed, abnormal gait pattern, evidence for imbalance, deviates 10-15 in outside 12 in walkway width. Requires more than 9 sec to ambulate 20 ft.    Ambulating Backwards Walks 20 ft, uses assistive device, slower speed, mild gait deviations, deviates 6-10 in outside 12 in walkway width.    Steps Alternating feet, must use rail.    Total Score 17             Motion Sensitivity Quotient  Intensity: 0 = none, 1 = Lightheaded, 2 = Mild, 3 = Moderate, 4 = Severe, 5 = Vomiting  Intensity  1. Sitting to supine 0  2. Supine to L side 2  3. Supine to R side 0  4. Supine to sitting 1  5. L Hallpike-Dix 0  6. Up from L  1  7. R Hallpike-Dix 0  8. Up from R  1  9. Sitting, head  tipped to L knee 0  10. Head up from L   knee 2  11. Sitting, head  tipped to R knee 0  12. Head up from R  knee 0  13. Sitting head turns x5 2  14.Sitting head nods x5 0  15. In stance, 180  turn to L  1  16. In stance, 180  turn to R 0  NMR: Reviewed HEP (see [employer] and patient instructions). Patient able to demo correctly without cues. Main issue is compliance at home and remembering to complete. Educated to continue using tracker    FOTO: DPS: 58, DFS: 55.8   PATIENT EDUCATION: Education details: Progress toward LTGs; D/C this visit Person educated: Patient and Spouse Education method: Explanation Education comprehension: verbalized understanding  HOME EDUCATION PROGRAM:  Access Code: [email or web address]   GOALS: Goals reviewed with patient? Yes  UPDATED GOALS   SHORT TERM GOALS: Target date: 04/04/2022   Pt will be independent with progressive HEP and report completing at >/= 3x/weekly for improved compliance Baseline: not compliant with HEP; completed 2x/weekly Goal status: PARTIALLY MET     LONG TERM GOALS: Target date: 04/25/2022     Pt will be independent with final HEP for improved balance and report completion of >/= 5x/weekly to demonstrate improved compliance Baseline: VOR HEP established; reports understanding of HEP, no compliant with frequency; reports independence with HEP, not compiant with 5x/weekly as completing on 2-3x/weekly Goal status: PARTIALLY MET   2.  Pt will improve FGA by 4 points from baseline to demonstrate improved balance and reduced fall risk  Baseline: TBA; 12/30; 13/30; 17/30 Goal status: MET   3.  Pt will report </= 2/5 for all movements on MSQ to indicate improvement in motion sensitivity and improved activity tolerance.   Baseline: 3-4; 2-3/5 on 7/21; 1-2/5 on 8/28 Goal status: MET   ASSESSMENT:   CLINICAL IMPRESSION: Today's skilled PT session focused on assessment of patient's progress toward LTGs. Patient able to partially meet or meet all LTGs demonstrating  progress demo improvements in balance with 17/30 on FGA and improvements in dizziness with 1-2/5 on MSQ. Patient demo readiness for discharge and patient agreeable. PT encouraged compliance with HEP to maintain gains achieved with PT services.    OBJECTIVE IMPAIRMENTS decreased activity tolerance, decreased balance, difficulty walking, decreased safety awareness, dizziness, postural dysfunction, and pain.    ACTIVITY LIMITATIONS stairs, transfers, and locomotion level   PARTICIPATION LIMITATIONS: driving and community activity   PERSONAL FACTORS Age, Time since onset of injury/illness/exacerbation, and 3+ comorbidities: Arthritis, Hx of Kidney Cancer, Diverticulitis, GERD, HA, Hepatitis, Migraines  are also affecting patient's functional outcome.    REHAB POTENTIAL: Fair     CLINICAL DECISION MAKING: Evolving/moderate complexity   EVALUATION COMPLEXITY: Moderate     PLAN: PT FREQUENCY: 1x/week   PT DURATION: 7 weeks   PLANNED INTERVENTIONS: Therapeutic exercises, Therapeutic activity, Neuromuscular re-education, Balance training, Gait training, Patient/Family education, Joint mobilization, Stair training, Vestibular training, Canalith repositioning, DME instructions, Aquatic Therapy, Cryotherapy, Moist heat, and Manual therapy   PLAN FOR NEXT SESSION: d/c this visit   [employer], PT, DPT 04/21/2022, 1:50 PM

## 2022-04-23 ENCOUNTER — Other Ambulatory Visit: Payer: Self-pay

## 2022-04-23 MED ORDER — ESTRADIOL 0.1 MG/GM VA CREA: TOPICAL_CREAM | VAGINAL | 0 refills | Status: DC

## 2022-04-23 NOTE — Telephone Encounter (Signed)
MMG 08/15/21 negative.   Last OV 12/04/2021.

## 2022-04-23 NOTE — Telephone Encounter (Signed)
Dr.Silva it appears estrace vaginal cream Rx from 01/23/22 was printed and not sent to the pharmacy. I called pharmacy to see if they received it via fax and they did not.  Patient will need Rx re-sent so she can have it mailed to her.

## 2022-04-23 NOTE — Addendum Note (Signed)
Addended by: Thamas Jaegers on: 04/23/2022 12:17 PM   Modules accepted: Orders

## 2022-04-24 NOTE — Telephone Encounter (Signed)
Message sent to appt desk pool to call her to schedule breast and pelvic exam.

## 2022-04-24 NOTE — Telephone Encounter (Signed)
Lorel Monaco, RMA Left message to call and schedule appointment

## 2022-04-29 ENCOUNTER — Ambulatory Visit: Payer: Medicare Other

## 2022-05-12 ENCOUNTER — Telehealth: Payer: Self-pay | Admitting: Obstetrics and Gynecology

## 2022-05-12 NOTE — Telephone Encounter (Signed)
"  Patient needs to schedule breast and pelvic exam with [name].  Spoke with patient on September 6 she stated would call back to schedule appointment; patient has not called back.

## 2022-05-13 NOTE — Telephone Encounter (Signed)
Left message to call Karrigan Messamore, RN at [facility], [phone].  

## 2022-05-16 ENCOUNTER — Telehealth: Payer: Self-pay | Admitting: *Deleted

## 2022-05-16 NOTE — Chronic Care Management (AMB) (Signed)
  Care Coordination  Outreach Note  05/16/2022 Name: LIDIE GLADE MRN: [id number] DOB: 08-04-1946   Care Coordination Outreach Attempts: An unsuccessful telephone outreach was attempted today to offer the patient information about available care coordination services as a benefit of their health plan.   Follow Up Plan:  Additional outreach attempts will be made to offer the patient care coordination information and services.   Encounter Outcome:  No Answer  [place]  Direct Dial: [phone]

## 2022-05-19 NOTE — Chronic Care Management (AMB) (Signed)
  Care Coordination  Outreach Note  05/19/2022 Name: Brenda [name] MRN: [id number] DOB: [birth year]   Care Coordination Outreach Attempts: A second unsuccessful outreach was attempted today to offer the patient with information about available care coordination services as a benefit of their health plan.     Follow Up Plan:  Additional outreach attempts will be made to offer the patient care coordination information and services.   Encounter Outcome:  No Answer  [place]  Direct Dial: [phone]

## 2022-05-20 NOTE — Telephone Encounter (Signed)
Per review of Epic, patient is scheduled for 05/26/22 at 1030 with [name].   Routing to Dr. Antony Blackbird.   Encounter closed.

## 2022-05-26 ENCOUNTER — Ambulatory Visit (INDEPENDENT_AMBULATORY_CARE_PROVIDER_SITE_OTHER): Payer: Medicare Other | Admitting: Obstetrics and Gynecology

## 2022-05-26 ENCOUNTER — Encounter: Payer: Self-pay | Admitting: Obstetrics and Gynecology

## 2022-05-26 VITALS — BP 124/80 | Ht 62.0 in | Wt 142.0 lb

## 2022-05-26 DIAGNOSIS — N94819 Vulvodynia, unspecified: Secondary | ICD-10-CM

## 2022-05-26 DIAGNOSIS — Z01419 Encounter for gynecological examination (general) (routine) without abnormal findings: Secondary | ICD-10-CM

## 2022-05-26 DIAGNOSIS — Z5181 Encounter for therapeutic drug level monitoring: Secondary | ICD-10-CM

## 2022-05-26 DIAGNOSIS — N952 Postmenopausal atrophic vaginitis: Secondary | ICD-10-CM

## 2022-05-26 MED ORDER — ESTRADIOL 0.1 MG/GM VA CREA: TOPICAL_CREAM | VAGINAL | 2 refills | Status: DC

## 2022-05-26 MED ORDER — DULOXETINE HCL 30 MG PO CPEP
30.0000 mg | ORAL_CAPSULE | Freq: Every day | ORAL | 2 refills | Status: DC
Start: 2022-05-26 — End: 2024-02-11

## 2022-05-26 NOTE — Patient Instructions (Signed)

## 2022-05-26 NOTE — Progress Notes (Signed)
76 y.o. G0P0000 Married Caucasian female here for annual exam.   Her husband is present for the visit today.  Patient is followed for vaginal atrophy, vaginal pain, and vulvodynia.  Symptoms since June, 2022 on chart review.   She is using vaginal estrogen cream 1 gram three times per week.  Her husband prepares this for her.   She is using gabapentin cream some but not a lot.  She is not needing this because she is not having too much discomfort.  She has used 200 of the 300 mg.   Patient's husband estimates that she can enough for 1 - 2 more months.   She is taking Cymbalta 30 mg daily and is doing well.   She is not having much vaginal/vulvar pain now.   PCP:  Gilmore Laroche, MD - [facility]   Patient's last menstrual period was 08/25/1992.           Sexually active: No.  The current method of family planning is post menopausal status.    Exercising: No.     Smoker:  no  Health Maintenance: Pap:  Normal 08/25/2008 History of abnormal Pap:  no MMG:  08/12/2021 BI-RADS CATEGORY  1: Negative. Colonoscopy:  2017 BMD:   11/22/2009  Result  Normal TDaP:  10/25/2021 Gardasil:   no HIV: Hep C: Screening Labs: PCP Flu vaccine and Covid booster planned.   reports that she quit smoking about 31 years ago. Her smoking use included cigarettes. She has a 30.00 pack-year smoking history. She has never used smokeless tobacco. She reports current alcohol use. She reports that she does not use drugs.  Past Medical History:  Diagnosis Date   Arthritis    Bronchitis    hx of when smoked    Cancer ([place]) 04/16/09   kidney cancer - tx by [facility] per her report released from f/u   Cataracts, bilateral    Diverticulitis 11/2004   Endometriosis 1994   GERD (gastroesophageal reflux disease)    hx hiatal hernia, hx esophageal stricture s/p dilation   Headache    Hepatitis    History of chicken pox    History of hiatal hernia    History of measles    History of mumps as a  child    Hx of hepatitis    with cirrhosis - per her report from macrodantin and eval in 2013 at [facility]   Hyperlipidemia    Left ear pain    Migraines    pt states vesticular migraines has dizziness in relation    Numbness and tingling    feet bilat has had for 40 years   Right knee pain    Scarlet fever    Shingles    Urinary tract bacterial infections    hx of   Vertigo    associated with headache, intermittent, chronic    Past Surgical History:  Procedure Laterality Date   ABDOMINAL HYSTERECTOMY  1994   TAH/BSO due to endometriosis   APPENDECTOMY     CATARACT EXTRACTION W/ INTRAOCULAR LENS IMPLANT Bilateral 05/2015, 04/2015   DIAGNOSTIC LAPAROSCOPY     LAPAROSCOPIC PARTIAL COLECTOMY N/A 04/10/2015   Procedure: LAPAROSCOPIC PARTIAL CECTOMY;  Surgeon: Ralene Ok, MD;  Location: [facility];  Service: General;  Laterality: N/A;   LUNG BIOPSY  1996   Negative   NEPHRECTOMY  04/16/09   Partial removal due to cancer   OOPHORECTOMY     TONSILLECTOMY     TONSILLECTOMY  Current Outpatient Medications  Medication Sig Dispense Refill   aspirin EC 81 MG tablet Take 81 mg by mouth daily. Swallow whole.     cycloSPORINE (RESTASIS) 0.05 % ophthalmic emulsion 1 drop 2 (two) times daily.     DULoxetine (CYMBALTA) 30 MG capsule Take 1 capsule (30 mg total) by mouth daily. 90 capsule 1   estradiol (ESTRACE) 0.1 MG/GM vaginal cream Use 1 g vaginally and to vulva two to three times per week as needed to maintain symptom relief. 42.5 g 0   NONFORMULARY OR COMPOUNDED ITEM Gabapentin 6% cream in neutral base.  Apply to vulva tid. Disp:  60 grams, RF:  one. 100 each 1   NONFORMULARY OR COMPOUNDED ITEM gabapentin cream 6% in neutral base Apply sparingly to vulva tid prn.  Disp:  300 grams RF:  zero. 300 each 0   omeprazole (PRILOSEC) 20 MG capsule Take 20 mg by mouth daily.     No current facility-administered medications for this visit.    Family History  Problem Relation Age of Onset    Thyroid cancer Mother    Cancer Mother        thyroid/ renal cell ca    Cancer Father    Colon cancer Neg Hx    Breast cancer Neg Hx    Stroke Neg Hx     Review of Systems  All other systems reviewed and are negative.   Exam:   BP 124/80 (BP Location: Right Arm, Patient Position: Sitting, Cuff Size: Normal)   Ht '5\' 2"'$  (1.575 m)   Wt 142 lb (64.4 kg)   LMP 08/25/1992   SpO2 98%   BMI 25.97 kg/m     General appearance: alert, cooperative and appears stated age Head: normocephalic, without obvious abnormality, atraumatic Neck: no adenopathy, supple, symmetrical, trachea midline and thyroid normal to inspection and palpation Lungs: clear to auscultation bilaterally Breasts: normal appearance, no masses or tenderness, No nipple retraction or dimpling, No nipple discharge or bleeding, No axillary adenopathy Heart: regular rate and rhythm Abdomen: soft, non-tender; no masses, no organomegaly Extremities: extremities normal, atraumatic, no cyanosis or edema Skin: skin color, texture, turgor normal. No rashes or lesions Lymph nodes: cervical, supraclavicular, and axillary nodes normal. Neurologic: grossly normal  Pelvic: External genitalia:  no lesions              No abnormal inguinal nodes palpated.              Urethra:  normal appearing urethra with no masses, tenderness or lesions              Bartholins and Skenes: normal                 Vagina: normal appearing vagina with normal color and discharge, no lesions.  Tender vaginal exam.  White cream removed from the vagina.               Cervix: absent              Pap taken: no Bimanual Exam:  Uterus: absent              Adnexa: no mass, fullness, tenderness              Rectal exam: yes.  Confirms.              Anus:  normal sphincter tone, no lesions  Chaperone was present for exam:  Kimalexis, CMA  Assessment:   Well woman visit with gynecologic  exam. Status post TAH/BSO.  Vaginal pain.  Vulvar pain.  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  Vaginal atrophy. Encounter for medication monitoring.   Plan: Mammogram screening discussed. Self breast awareness reviewed. Pap and HR HPV as above. Guidelines for Calcium, Vitamin D, regular exercise program including cardiovascular and weight bearing exercise. Refill of vaginal estrogen cream.  I discussed potential effect on breast cancer.  Refill of Cymbalta.  We discussed the many benefits of this medication.  They will contact me if she needs a refill of Gabapentin cream.  They prefer a large amount from [place] when the time comes for this.   Follow up annually and prn.   After visit summary provided.   25 min  total time was spent for this patient encounter, including preparation, face-to-face counseling with the patient, coordination of care, and documentation of the encounter.

## 2022-05-26 NOTE — Chronic Care Management (AMB) (Signed)
  Care Coordination  Outreach Note  05/26/2022 Name: Brenda [name] MRN: [id number] DOB: 1945/11/18   Care Coordination Outreach Attempts: A third unsuccessful outreach was attempted today to offer the patient with information about available care coordination services as a benefit of their health plan.   Follow Up Plan:  No further outreach attempts will be made at this time. We have been unable to contact the patient to offer or enroll patient in care coordination services  Encounter Outcome:  No Answer  [place]: [phone]

## 2022-06-23 DIAGNOSIS — R413 Other amnesia: Secondary | ICD-10-CM | POA: Diagnosis not present

## 2022-06-23 DIAGNOSIS — F0390 Unspecified dementia without behavioral disturbance: Secondary | ICD-10-CM | POA: Diagnosis not present

## 2022-06-23 DIAGNOSIS — I700 Atherosclerosis of aorta: Secondary | ICD-10-CM | POA: Diagnosis not present

## 2022-06-23 DIAGNOSIS — K7460 Unspecified cirrhosis of liver: Secondary | ICD-10-CM | POA: Diagnosis not present

## 2022-06-23 DIAGNOSIS — R102 Pelvic and perineal pain: Secondary | ICD-10-CM | POA: Diagnosis not present

## 2022-06-23 DIAGNOSIS — Z85528 Personal history of other malignant neoplasm of kidney: Secondary | ICD-10-CM | POA: Diagnosis not present

## 2022-06-23 DIAGNOSIS — D8481 Immunodeficiency due to conditions classified elsewhere: Secondary | ICD-10-CM | POA: Diagnosis not present

## 2022-06-23 DIAGNOSIS — J439 Emphysema, unspecified: Secondary | ICD-10-CM | POA: Diagnosis not present

## 2022-06-23 DIAGNOSIS — I10 Essential (primary) hypertension: Secondary | ICD-10-CM | POA: Diagnosis not present

## 2022-06-23 DIAGNOSIS — I6529 Occlusion and stenosis of unspecified carotid artery: Secondary | ICD-10-CM | POA: Diagnosis not present

## 2022-06-23 DIAGNOSIS — Z0000 Encounter for general adult medical examination without abnormal findings: Secondary | ICD-10-CM | POA: Diagnosis not present

## 2022-06-23 DIAGNOSIS — E785 Hyperlipidemia, unspecified: Secondary | ICD-10-CM | POA: Diagnosis not present

## 2022-06-23 DIAGNOSIS — Z85828 Personal history of other malignant neoplasm of skin: Secondary | ICD-10-CM | POA: Diagnosis not present

## 2022-06-23 DIAGNOSIS — I6523 Occlusion and stenosis of bilateral carotid arteries: Secondary | ICD-10-CM | POA: Diagnosis not present

## 2022-07-15 DIAGNOSIS — F0390 Unspecified dementia without behavioral disturbance: Secondary | ICD-10-CM | POA: Diagnosis not present

## 2022-07-15 DIAGNOSIS — D8481 Immunodeficiency due to conditions classified elsewhere: Secondary | ICD-10-CM | POA: Diagnosis not present

## 2022-07-15 DIAGNOSIS — K7460 Unspecified cirrhosis of liver: Secondary | ICD-10-CM | POA: Diagnosis not present

## 2022-08-14 NOTE — Progress Notes (Signed)
Patient did not contact office to proceed with test.  Order will be cancelled.

## 2022-09-05 ENCOUNTER — Telehealth: Payer: Self-pay

## 2022-09-05 NOTE — Telephone Encounter (Signed)
Mailed pt a letter to advise her to change PCP with their insurance company.  Elyse Jarvis RMA

## 2022-09-08 ENCOUNTER — Telehealth: Payer: Self-pay

## 2022-09-08 NOTE — Patient Outreach (Signed)
  Care Coordination   09/08/2022 Name: Brenda [name] MRN: [id number] DOB: 1945-12-10   Care Coordination Outreach Attempts:  An unsuccessful telephone outreach was attempted today to offer the patient information about available care coordination services as a benefit of their health plan.   Follow Up Plan:  Additional outreach attempts will be made to offer the patient care coordination information and services.   Encounter Outcome:  No Answer   Care Coordination Interventions:  No, not indicated    Jone Baseman, RN, MSN [place] [facility] Care Management Coordinator Direct Line [phone]

## 2022-09-11 DIAGNOSIS — I700 Atherosclerosis of aorta: Secondary | ICD-10-CM | POA: Diagnosis not present

## 2022-09-11 DIAGNOSIS — J432 Centrilobular emphysema: Secondary | ICD-10-CM | POA: Diagnosis not present

## 2022-09-11 DIAGNOSIS — K7460 Unspecified cirrhosis of liver: Secondary | ICD-10-CM | POA: Diagnosis not present

## 2022-09-11 DIAGNOSIS — F02A0 Dementia in other diseases classified elsewhere, mild, without behavioral disturbance, psychotic disturbance, mood disturbance, and anxiety: Secondary | ICD-10-CM | POA: Diagnosis not present

## 2022-09-11 DIAGNOSIS — D8481 Immunodeficiency due to conditions classified elsewhere: Secondary | ICD-10-CM | POA: Diagnosis not present

## 2022-09-11 DIAGNOSIS — G309 Alzheimer's disease, unspecified: Secondary | ICD-10-CM | POA: Diagnosis not present

## 2022-09-22 ENCOUNTER — Telehealth: Payer: Self-pay

## 2022-09-22 NOTE — Patient Outreach (Signed)
  Care Coordination   09/22/2022 Name: Brenda [name] MRN: [id number] DOB: May 21, 1946   Care Coordination Outreach Attempts:  A second unsuccessful outreach was attempted today to offer the patient with information about available care coordination services as a benefit of their health plan.     Follow Up Plan:  Additional outreach attempts will be made to offer the patient care coordination information and services.   Encounter Outcome:  No Answer   Care Coordination Interventions:  No, not indicated    Jone Baseman, RN, MSN [place] [facility] Care Management Coordinator Direct Line [phone]

## 2023-01-20 ENCOUNTER — Encounter: Payer: Self-pay | Admitting: Obstetrics and Gynecology

## 2023-01-20 NOTE — Telephone Encounter (Signed)
Please contact pt per preference for appt for breast exam for breast pain. Thanks.

## 2023-02-03 DIAGNOSIS — M25569 Pain in unspecified knee: Secondary | ICD-10-CM | POA: Diagnosis not present

## 2023-02-03 DIAGNOSIS — R42 Dizziness and giddiness: Secondary | ICD-10-CM | POA: Diagnosis not present

## 2023-02-03 DIAGNOSIS — I10 Essential (primary) hypertension: Secondary | ICD-10-CM | POA: Diagnosis not present

## 2023-02-03 DIAGNOSIS — K7460 Unspecified cirrhosis of liver: Secondary | ICD-10-CM | POA: Diagnosis not present

## 2023-02-03 DIAGNOSIS — H9312 Tinnitus, left ear: Secondary | ICD-10-CM | POA: Diagnosis not present

## 2023-02-03 DIAGNOSIS — N644 Mastodynia: Secondary | ICD-10-CM | POA: Diagnosis not present

## 2023-02-03 DIAGNOSIS — R7303 Prediabetes: Secondary | ICD-10-CM | POA: Diagnosis not present

## 2023-02-03 DIAGNOSIS — D696 Thrombocytopenia, unspecified: Secondary | ICD-10-CM | POA: Diagnosis not present

## 2023-02-03 DIAGNOSIS — I8510 Secondary esophageal varices without bleeding: Secondary | ICD-10-CM | POA: Diagnosis not present

## 2023-02-03 DIAGNOSIS — I700 Atherosclerosis of aorta: Secondary | ICD-10-CM | POA: Diagnosis not present

## 2023-02-03 DIAGNOSIS — Z0000 Encounter for general adult medical examination without abnormal findings: Secondary | ICD-10-CM | POA: Diagnosis not present

## 2023-02-03 DIAGNOSIS — E785 Hyperlipidemia, unspecified: Secondary | ICD-10-CM | POA: Diagnosis not present

## 2023-02-17 ENCOUNTER — Other Ambulatory Visit: Payer: Self-pay | Admitting: Adult Health

## 2023-02-17 DIAGNOSIS — N6320 Unspecified lump in the left breast, unspecified quadrant: Secondary | ICD-10-CM

## 2023-02-17 DIAGNOSIS — N644 Mastodynia: Secondary | ICD-10-CM

## 2023-02-17 DIAGNOSIS — K7460 Unspecified cirrhosis of liver: Secondary | ICD-10-CM

## 2023-02-17 DIAGNOSIS — R772 Abnormality of alphafetoprotein: Secondary | ICD-10-CM

## 2023-02-17 DIAGNOSIS — D696 Thrombocytopenia, unspecified: Secondary | ICD-10-CM

## 2023-03-10 ENCOUNTER — Ambulatory Visit
Admission: RE | Admit: 2023-03-10 | Discharge: 2023-03-10 | Disposition: A | Discharge status: Home / Self Care | Payer: Medicare Other | Source: Ambulatory Visit | Attending: Adult Health | Admitting: Adult Health

## 2023-03-10 ENCOUNTER — Ambulatory Visit: Payer: Medicare Other

## 2023-03-10 DIAGNOSIS — N644 Mastodynia: Secondary | ICD-10-CM | POA: Diagnosis not present

## 2023-03-10 DIAGNOSIS — D696 Thrombocytopenia, unspecified: Secondary | ICD-10-CM

## 2023-03-10 DIAGNOSIS — K7460 Unspecified cirrhosis of liver: Secondary | ICD-10-CM

## 2023-03-10 DIAGNOSIS — R772 Abnormality of alphafetoprotein: Secondary | ICD-10-CM

## 2023-03-10 DIAGNOSIS — N6320 Unspecified lump in the left breast, unspecified quadrant: Secondary | ICD-10-CM

## 2023-03-24 ENCOUNTER — Ambulatory Visit
Admission: RE | Admit: 2023-03-24 | Discharge: 2023-03-24 | Disposition: A | Discharge status: Home / Self Care | Payer: Medicare Other | Source: Ambulatory Visit | Attending: Adult Health | Admitting: Adult Health

## 2023-03-24 DIAGNOSIS — K7460 Unspecified cirrhosis of liver: Secondary | ICD-10-CM | POA: Diagnosis not present

## 2023-03-24 DIAGNOSIS — R21 Rash and other nonspecific skin eruption: Secondary | ICD-10-CM | POA: Diagnosis not present

## 2023-03-24 DIAGNOSIS — Z85528 Personal history of other malignant neoplasm of kidney: Secondary | ICD-10-CM | POA: Diagnosis not present

## 2023-03-24 DIAGNOSIS — D696 Thrombocytopenia, unspecified: Secondary | ICD-10-CM | POA: Diagnosis not present

## 2023-03-24 DIAGNOSIS — R102 Pelvic and perineal pain: Secondary | ICD-10-CM | POA: Diagnosis not present

## 2023-03-24 DIAGNOSIS — R772 Abnormality of alphafetoprotein: Secondary | ICD-10-CM | POA: Diagnosis not present

## 2023-04-02 DIAGNOSIS — K7460 Unspecified cirrhosis of liver: Secondary | ICD-10-CM | POA: Diagnosis not present

## 2023-04-02 DIAGNOSIS — Z85528 Personal history of other malignant neoplasm of kidney: Secondary | ICD-10-CM | POA: Diagnosis not present

## 2023-05-16 DIAGNOSIS — Z23 Encounter for immunization: Secondary | ICD-10-CM | POA: Diagnosis not present

## 2023-05-17 ENCOUNTER — Encounter: Payer: Self-pay | Admitting: Obstetrics and Gynecology

## 2023-06-02 DIAGNOSIS — I8500 Esophageal varices without bleeding: Secondary | ICD-10-CM | POA: Diagnosis not present

## 2023-06-02 DIAGNOSIS — M545 Low back pain, unspecified: Secondary | ICD-10-CM | POA: Diagnosis not present

## 2023-06-02 DIAGNOSIS — K7460 Unspecified cirrhosis of liver: Secondary | ICD-10-CM | POA: Diagnosis not present

## 2023-06-02 DIAGNOSIS — K222 Esophageal obstruction: Secondary | ICD-10-CM | POA: Diagnosis not present

## 2023-06-18 ENCOUNTER — Telehealth: Payer: Self-pay | Admitting: Gastroenterology

## 2023-06-18 NOTE — Telephone Encounter (Signed)
Good morning Dr. Chales Abrahams,   Supervising provider AM 10/24  We received a call from this patient's  husband wishing to schedule patient for an evaluation for an endoscopy. Due to esophageal varices, esophageal stricture and cirrhosis of the liver. Patient has gi Hx at [facility] and also [facility], and last had procedure in 10/2020. Records are in Epic for your review. Would you please advise on scheduling.

## 2023-06-18 NOTE — Telephone Encounter (Signed)
She has extensive GI history with [facility]-Dr. Dulce Sellar I would suggest to please stay with him as he knows the patient very well RG

## 2023-06-30 NOTE — Telephone Encounter (Signed)
Left patient a detailed voicemail with recommendations.

## 2023-07-04 DIAGNOSIS — M545 Low back pain, unspecified: Secondary | ICD-10-CM | POA: Diagnosis not present

## 2023-07-04 DIAGNOSIS — Z556 Problems related to health literacy: Secondary | ICD-10-CM | POA: Diagnosis not present

## 2023-07-04 DIAGNOSIS — M1990 Unspecified osteoarthritis, unspecified site: Secondary | ICD-10-CM | POA: Diagnosis not present

## 2023-07-04 DIAGNOSIS — R1310 Dysphagia, unspecified: Secondary | ICD-10-CM | POA: Diagnosis not present

## 2023-07-04 DIAGNOSIS — K222 Esophageal obstruction: Secondary | ICD-10-CM | POA: Diagnosis not present

## 2023-07-04 DIAGNOSIS — K760 Fatty (change of) liver, not elsewhere classified: Secondary | ICD-10-CM | POA: Diagnosis not present

## 2023-07-04 DIAGNOSIS — Z9181 History of falling: Secondary | ICD-10-CM | POA: Diagnosis not present

## 2023-07-04 DIAGNOSIS — Z7982 Long term (current) use of aspirin: Secondary | ICD-10-CM | POA: Diagnosis not present

## 2023-07-04 DIAGNOSIS — K7460 Unspecified cirrhosis of liver: Secondary | ICD-10-CM | POA: Diagnosis not present

## 2023-07-04 DIAGNOSIS — D696 Thrombocytopenia, unspecified: Secondary | ICD-10-CM | POA: Diagnosis not present

## 2023-07-04 DIAGNOSIS — I8500 Esophageal varices without bleeding: Secondary | ICD-10-CM | POA: Diagnosis not present

## 2023-07-04 DIAGNOSIS — F0390 Unspecified dementia without behavioral disturbance: Secondary | ICD-10-CM | POA: Diagnosis not present

## 2023-07-04 DIAGNOSIS — N2889 Other specified disorders of kidney and ureter: Secondary | ICD-10-CM | POA: Diagnosis not present

## 2023-07-04 DIAGNOSIS — M21061 Valgus deformity, not elsewhere classified, right knee: Secondary | ICD-10-CM | POA: Diagnosis not present

## 2023-07-08 DIAGNOSIS — R1310 Dysphagia, unspecified: Secondary | ICD-10-CM | POA: Diagnosis not present

## 2023-07-08 DIAGNOSIS — I8500 Esophageal varices without bleeding: Secondary | ICD-10-CM | POA: Diagnosis not present

## 2023-07-08 DIAGNOSIS — K7460 Unspecified cirrhosis of liver: Secondary | ICD-10-CM | POA: Diagnosis not present

## 2023-07-08 DIAGNOSIS — M545 Low back pain, unspecified: Secondary | ICD-10-CM | POA: Diagnosis not present

## 2023-07-08 DIAGNOSIS — K222 Esophageal obstruction: Secondary | ICD-10-CM | POA: Diagnosis not present

## 2023-07-08 DIAGNOSIS — F0390 Unspecified dementia without behavioral disturbance: Secondary | ICD-10-CM | POA: Diagnosis not present

## 2023-07-13 DIAGNOSIS — K7460 Unspecified cirrhosis of liver: Secondary | ICD-10-CM | POA: Diagnosis not present

## 2023-07-13 DIAGNOSIS — I8500 Esophageal varices without bleeding: Secondary | ICD-10-CM | POA: Diagnosis not present

## 2023-07-13 DIAGNOSIS — M545 Low back pain, unspecified: Secondary | ICD-10-CM | POA: Diagnosis not present

## 2023-07-13 DIAGNOSIS — K222 Esophageal obstruction: Secondary | ICD-10-CM | POA: Diagnosis not present

## 2023-07-13 DIAGNOSIS — F0390 Unspecified dementia without behavioral disturbance: Secondary | ICD-10-CM | POA: Diagnosis not present

## 2023-07-13 DIAGNOSIS — R1310 Dysphagia, unspecified: Secondary | ICD-10-CM | POA: Diagnosis not present

## 2023-07-20 DIAGNOSIS — M545 Low back pain, unspecified: Secondary | ICD-10-CM | POA: Diagnosis not present

## 2023-07-20 DIAGNOSIS — K222 Esophageal obstruction: Secondary | ICD-10-CM | POA: Diagnosis not present

## 2023-07-20 DIAGNOSIS — F0390 Unspecified dementia without behavioral disturbance: Secondary | ICD-10-CM | POA: Diagnosis not present

## 2023-07-20 DIAGNOSIS — I8500 Esophageal varices without bleeding: Secondary | ICD-10-CM | POA: Diagnosis not present

## 2023-07-20 DIAGNOSIS — K7460 Unspecified cirrhosis of liver: Secondary | ICD-10-CM | POA: Diagnosis not present

## 2023-07-20 DIAGNOSIS — R1310 Dysphagia, unspecified: Secondary | ICD-10-CM | POA: Diagnosis not present

## 2023-07-29 DIAGNOSIS — M545 Low back pain, unspecified: Secondary | ICD-10-CM | POA: Diagnosis not present

## 2023-07-29 DIAGNOSIS — I8500 Esophageal varices without bleeding: Secondary | ICD-10-CM | POA: Diagnosis not present

## 2023-07-29 DIAGNOSIS — F0390 Unspecified dementia without behavioral disturbance: Secondary | ICD-10-CM | POA: Diagnosis not present

## 2023-07-29 DIAGNOSIS — K222 Esophageal obstruction: Secondary | ICD-10-CM | POA: Diagnosis not present

## 2023-07-29 DIAGNOSIS — R1310 Dysphagia, unspecified: Secondary | ICD-10-CM | POA: Diagnosis not present

## 2023-07-29 DIAGNOSIS — K7460 Unspecified cirrhosis of liver: Secondary | ICD-10-CM | POA: Diagnosis not present

## 2023-08-03 DIAGNOSIS — I8500 Esophageal varices without bleeding: Secondary | ICD-10-CM | POA: Diagnosis not present

## 2023-08-03 DIAGNOSIS — K760 Fatty (change of) liver, not elsewhere classified: Secondary | ICD-10-CM | POA: Diagnosis not present

## 2023-08-03 DIAGNOSIS — F0390 Unspecified dementia without behavioral disturbance: Secondary | ICD-10-CM | POA: Diagnosis not present

## 2023-08-03 DIAGNOSIS — D696 Thrombocytopenia, unspecified: Secondary | ICD-10-CM | POA: Diagnosis not present

## 2023-08-03 DIAGNOSIS — K7460 Unspecified cirrhosis of liver: Secondary | ICD-10-CM | POA: Diagnosis not present

## 2023-08-03 DIAGNOSIS — Z556 Problems related to health literacy: Secondary | ICD-10-CM | POA: Diagnosis not present

## 2023-08-03 DIAGNOSIS — M1990 Unspecified osteoarthritis, unspecified site: Secondary | ICD-10-CM | POA: Diagnosis not present

## 2023-08-03 DIAGNOSIS — Z9181 History of falling: Secondary | ICD-10-CM | POA: Diagnosis not present

## 2023-08-03 DIAGNOSIS — K222 Esophageal obstruction: Secondary | ICD-10-CM | POA: Diagnosis not present

## 2023-08-03 DIAGNOSIS — N2889 Other specified disorders of kidney and ureter: Secondary | ICD-10-CM | POA: Diagnosis not present

## 2023-08-03 DIAGNOSIS — M545 Low back pain, unspecified: Secondary | ICD-10-CM | POA: Diagnosis not present

## 2023-08-03 DIAGNOSIS — M21061 Valgus deformity, not elsewhere classified, right knee: Secondary | ICD-10-CM | POA: Diagnosis not present

## 2023-08-03 DIAGNOSIS — Z7982 Long term (current) use of aspirin: Secondary | ICD-10-CM | POA: Diagnosis not present

## 2023-08-03 DIAGNOSIS — R1310 Dysphagia, unspecified: Secondary | ICD-10-CM | POA: Diagnosis not present

## 2023-08-10 DIAGNOSIS — K7460 Unspecified cirrhosis of liver: Secondary | ICD-10-CM | POA: Diagnosis not present

## 2023-08-10 DIAGNOSIS — K222 Esophageal obstruction: Secondary | ICD-10-CM | POA: Diagnosis not present

## 2023-08-10 DIAGNOSIS — R1310 Dysphagia, unspecified: Secondary | ICD-10-CM | POA: Diagnosis not present

## 2023-08-10 DIAGNOSIS — M545 Low back pain, unspecified: Secondary | ICD-10-CM | POA: Diagnosis not present

## 2023-08-10 DIAGNOSIS — I8500 Esophageal varices without bleeding: Secondary | ICD-10-CM | POA: Diagnosis not present

## 2023-08-10 DIAGNOSIS — F0390 Unspecified dementia without behavioral disturbance: Secondary | ICD-10-CM | POA: Diagnosis not present

## 2023-08-11 DIAGNOSIS — F02818 Dementia in other diseases classified elsewhere, unspecified severity, with other behavioral disturbance: Secondary | ICD-10-CM | POA: Diagnosis not present

## 2023-08-11 DIAGNOSIS — G301 Alzheimer's disease with late onset: Secondary | ICD-10-CM | POA: Diagnosis not present

## 2023-08-24 DIAGNOSIS — R1310 Dysphagia, unspecified: Secondary | ICD-10-CM | POA: Diagnosis not present

## 2023-08-24 DIAGNOSIS — K222 Esophageal obstruction: Secondary | ICD-10-CM | POA: Diagnosis not present

## 2023-08-24 DIAGNOSIS — M545 Low back pain, unspecified: Secondary | ICD-10-CM | POA: Diagnosis not present

## 2023-08-24 DIAGNOSIS — K7460 Unspecified cirrhosis of liver: Secondary | ICD-10-CM | POA: Diagnosis not present

## 2023-08-24 DIAGNOSIS — F0390 Unspecified dementia without behavioral disturbance: Secondary | ICD-10-CM | POA: Diagnosis not present

## 2023-08-24 DIAGNOSIS — I8500 Esophageal varices without bleeding: Secondary | ICD-10-CM | POA: Diagnosis not present

## 2023-12-09 ENCOUNTER — Encounter: Payer: Self-pay | Admitting: Family Medicine

## 2024-02-11 ENCOUNTER — Ambulatory Visit: Admitting: Family Medicine

## 2024-02-11 ENCOUNTER — Encounter: Payer: Self-pay | Admitting: Family Medicine

## 2024-02-11 VITALS — BP 130/80 | HR 81 | Ht 62.0 in | Wt 140.4 lb

## 2024-02-11 DIAGNOSIS — E785 Hyperlipidemia, unspecified: Secondary | ICD-10-CM | POA: Diagnosis not present

## 2024-02-11 DIAGNOSIS — N94819 Vulvodynia, unspecified: Secondary | ICD-10-CM

## 2024-02-11 DIAGNOSIS — I10 Essential (primary) hypertension: Secondary | ICD-10-CM

## 2024-02-11 DIAGNOSIS — F0390 Unspecified dementia without behavioral disturbance: Secondary | ICD-10-CM | POA: Diagnosis not present

## 2024-02-11 DIAGNOSIS — L989 Disorder of the skin and subcutaneous tissue, unspecified: Secondary | ICD-10-CM | POA: Insufficient documentation

## 2024-02-11 DIAGNOSIS — N952 Postmenopausal atrophic vaginitis: Secondary | ICD-10-CM | POA: Diagnosis not present

## 2024-02-11 DIAGNOSIS — Z7689 Persons encountering health services in other specified circumstances: Secondary | ICD-10-CM

## 2024-02-11 NOTE — Assessment & Plan Note (Signed)
 Pt uses vaginal estrogen cream for vaginal atrophy.  Gets medications through mail in pharmacy.

## 2024-02-11 NOTE — Assessment & Plan Note (Signed)
 Hard, keratotic lesion on the anterior chest wall left of the sternum.  Appears 3 weeks ago.  Given the speed at which it has grown would favor cutaneous horn over malignant lesion or AK.  Froze the lesion with liquid nitrogen today.

## 2024-02-11 NOTE — Assessment & Plan Note (Signed)
 Not taking medication for this.  Appears to be mild. She can carry on normal conversation and appears to have a decent understanding of her medical history.  Pt's previous pcp office closed so it may be difficult to get previous records over the past few years.

## 2024-02-11 NOTE — Patient Instructions (Signed)
 It was nice to see you today,  We addressed the following topics today: -I can send in your medication to your pharmacy - I will need to get labs from you so we will have to schedule a lab visit - I would like to see you back in 2 months.  Have a great day,  Etha Henle, MD

## 2024-02-11 NOTE — Progress Notes (Unsigned)
 New Patient Office Visit  Subjective   Patient ID: Brenda [name], female    DOB: 10/19/1945  Age: 78 y.o. MRN: [id number]  CC:  Chief Complaint  Patient presents with   New Patient (Initial Visit)    HPI Brenda [name] presents to establish care  Subjective - Established patient, transferred from Dr. Rachael Budd ([facility]) after office closed at end of May - Accompanied by husband Sammie Crigler who assists with history - Reports chronic objective dizziness/vertigo - episodic, unpredictable, sometimes lasting days (dizzy most of last week, none today) - Skin lesion on face noted - present for approximately 3 weeks, mildly bothersome due to rubbing  Medications Estradiol  vaginal cream, needs prescription renewal, previously prescribed by Dr. 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   omeprazole (PRILOSEC) 20 MG capsule Take 20 mg by mouth daily.   [DISCONTINUED] aspirin  EC 81 MG tablet Take 81 mg by mouth daily. Swallow whole.   [DISCONTINUED] cycloSPORINE (RESTASIS) 0.05 % ophthalmic emulsion 1 drop 2 (two) times daily.   [DISCONTINUED] DULoxetine  (CYMBALTA ) 30 MG capsule Take 1 capsule (30 mg total) by mouth daily.   [DISCONTINUED] NONFORMULARY OR COMPOUNDED ITEM  Gabapentin 6% cream in neutral base.  Apply to vulva tid. Disp:  60 grams, RF:  one.   [DISCONTINUED] NONFORMULARY OR COMPOUNDED ITEM gabapentin cream 6% in neutral base Apply sparingly to vulva tid prn.  Disp:  300 grams RF:  zero.   No facility-administered encounter medications on file as of 02/11/2024.    Past Medical History:  Diagnosis Date   Arthritis    Bronchitis    hx of when smoked    Cancer (HCC) 04/16/2009   kidney cancer - tx by [facility] per her report released from f/u   Cataracts, bilateral    Diverticulitis 11/23/2004   Endometriosis 08/25/1992   GERD (gastroesophageal reflux disease)    hx hiatal hernia, hx esophageal stricture s/p dilation   Headache    Hepatitis    History of chicken pox    History of hiatal hernia    History of measles    History of mumps as a child    Hx of hepatitis    with cirrhosis - per her report from macrodantin and eval in 2013 at [facility]   Hyperlipidemia Provider review requsted   Hypertension Provider review requested   Left ear pain    Migraines    pt states vesticular migraines has dizziness in relation    Numbness and tingling    feet bilat has had for 40 years   Right knee pain    Scarlet fever    Shingles    Urinary tract bacterial infections    hx of   Vertigo    associated with headache, intermittent, chronic    Past Surgical History:  Procedure Laterality Date   ABDOMINAL HYSTERECTOMY  1994   TAH/BSO due to endometriosis   APPENDECTOMY  Maybe. See MyChart [place] Dec   CATARACT EXTRACTION W/ INTRAOCULAR LENS IMPLANT Bilateral 05/2015, 04/2015   COLON SURGERY     DIAGNOSTIC LAPAROSCOPY     EYE SURGERY  2015   LAPAROSCOPIC PARTIAL COLECTOMY N/A 04/10/2015   Procedure: LAPAROSCOPIC PARTIAL CECTOMY;  Surgeon: Shela Derby, MD;  Location: [facility];  Service: General;  Laterality: N/A;   LUNG BIOPSY  1996   Negative   NEPHRECTOMY  04/16/2009   Partial removal due to cancer   OOPHORECTOMY     TONSILLECTOMY      TONSILLECTOMY      Family History  Problem Relation Age of Onset   Thyroid  cancer Mother    Cancer Mother        thyroid / renal cell ca    Arthritis Mother    Hearing loss Mother    Cancer Father    Arthritis Father    Colon cancer Neg Hx    Breast cancer Neg Hx    Stroke Neg Hx     Social History   Socioeconomic History   Marital status: Married    Spouse name: Not on file   Number of children: 0   Years of education: Not on file   Highest education level: Not on file  Occupational History   Occupation: Retired    Associate Professor: RETIRED  Tobacco Use   Smoking status: Former    Current packs/day: 0.00    Average packs/day: 1.5 packs/day for 20.0 years (30.0 ttl pk-yrs)    Types: Cigarettes    Start date: 08/25/1970    Quit date: 08/25/1990    Years since quitting: 33.4   Smokeless tobacco: Never  Vaping Use   Vaping status: Never Used  Substance and Sexual Activity   Alcohol use: Yes    Alcohol/week: 6.0 standard drinks of alcohol    Types: 6 Glasses of wine per week    Comment: Rare   Drug use: No   Sexual activity: Not Currently    Partners: Male    Birth control/protection: Surgical, Post-menopausal    Comment: TAH  Other Topics Concern   Not on file  Social History Narrative   Not on file   Social Drivers of Health   Financial Resource Strain: Low Risk  (11/22/2021)   Overall Financial Resource Strain (CARDIA)    Difficulty of Paying Living Expenses: Not hard at all  Food Insecurity: No Food Insecurity (11/22/2021)   Hunger Vital Sign    Worried About Running Out of Food in the Last Year: Never true    Ran Out of Food in the Last Year: Never true  Transportation Needs: No Transportation Needs (12/10/2021)   PRAPARE - Administrator, Civil Service (Medical): No    Lack of Transportation (Non-Medical): No  Physical Activity: Inactive (11/22/2021)   Exercise Vital Sign    Days of Exercise per Week: 0 days    Minutes of Exercise per Session: 0  min  Stress: No Stress Concern Present (11/22/2021)   [employer] of Occupational Health - Occupational Stress Questionnaire    Feeling of Stress : Not at all  Social Connections: Moderately Integrated (11/22/2021)   Social Connection and Isolation Panel    Frequency of Communication with Friends and Family: More than three times a week    Frequency of Social Gatherings with Friends and Family: Once a week    Attends Religious Services: Never    Active Member  of Clubs or Organizations: Yes    Attends Banker Meetings: Never    Marital Status: Married  Catering manager Violence: Not At Risk (11/22/2021)   Humiliation, Afraid, Rape, and Kick questionnaire    Fear of Current or Ex-Partner: No    Emotionally Abused: No    Physically Abused: No    Sexually Abused: No    ROS     Objective   BP 130/80   Pulse 81   Ht 5' 2 (1.575 m)   Wt 140 lb 6.4 oz (63.7 kg)   LMP 08/25/1992   SpO2 97%   BMI 25.68 kg/m   Physical Exam Gen: alert, oriented. Accompanied by husband Heent: perrla, eomi Cv: rrr, no murmur Pulm: lctab Gi: soft, nbs.  Msk: strength equal bilaterally Skin: keratotic lesion on the left upper anterior torso.  Psych: pleasant.        Assessment & Plan:   Essential hypertension -     CBC; Future -     Comprehensive metabolic panel with GFR; Future  Vaginal atrophy  Hyperlipidemia, unspecified hyperlipidemia type -     Lipid panel; Future -     Hemoglobin A1c; Future  Dementia, unspecified dementia severity, unspecified dementia type, unspecified whether behavioral, psychotic, or mood disturbance or anxiety (HCC) Assessment & Plan: Not taking medication for this.  Appears to be mild. She can carry on normal conversation and appears to have a decent understanding of her medical history.  Pt's previous pcp office closed so it may be difficult to get previous records over the past few years.    Orders: -     TSH; Future  Skin lesion of  chest wall Assessment & Plan: Hard, keratotic lesion on the anterior chest wall left of the sternum.  Appears 3 weeks ago.  Given the speed at which it has grown would favor cutaneous horn over malignant lesion or AK.  Froze the lesion with liquid nitrogen today.     Vulvodynia Assessment & Plan: Pt uses vaginal estrogen cream for vaginal atrophy.  Gets medications through mail in pharmacy.       Return in about 2 months (around 04/12/2024) for htn, dementia.   Laneta Pintos, MD

## 2024-02-15 ENCOUNTER — Encounter: Admitting: Family Medicine

## 2024-02-15 ENCOUNTER — Other Ambulatory Visit

## 2024-02-16 LAB — LIPID PANEL
Chol/HDL Ratio: 3.9 ratio (ref 0.0–4.4)
Cholesterol, Total: 198 mg/dL (ref 100–199)
HDL: 51 mg/dL (ref >39)
LDL Chol Calc (NIH): 124 mg/dL — ABNORMAL HIGH (ref 0–99)
Triglycerides: 130 mg/dL (ref 0–149)
VLDL Cholesterol Cal: 23 mg/dL (ref 5–40)

## 2024-02-16 LAB — COMPREHENSIVE METABOLIC PANEL WITH GFR
ALT: 6 IU/L (ref 0–32)
AST: 23 IU/L (ref 0–40)
Albumin: 4.4 g/dL (ref 3.8–4.8)
Alkaline Phosphatase: 83 IU/L (ref 44–121)
BUN/Creatinine Ratio: 11 — ABNORMAL LOW (ref 12–28)
BUN: 10 mg/dL (ref 8–27)
Bilirubin Total: 0.6 mg/dL (ref 0.0–1.2)
CO2: 23 mmol/L (ref 20–29)
Calcium: 9.3 mg/dL (ref 8.7–10.3)
Chloride: 92 mmol/L — ABNORMAL LOW (ref 96–106)
Creatinine, Ser: 0.91 mg/dL (ref 0.57–1.00)
Globulin, Total: 2.7 g/dL (ref 1.5–4.5)
Glucose: 92 mg/dL (ref 70–99)
Potassium: 4.6 mmol/L (ref 3.5–5.2)
Sodium: 129 mmol/L — ABNORMAL LOW (ref 134–144)
Total Protein: 7.1 g/dL (ref 6.0–8.5)
eGFR: 65 mL/min/{1.73_m2} (ref >59)

## 2024-02-16 LAB — CBC
Hematocrit: 45.7 % (ref 34.0–46.6)
Hemoglobin: 14.9 g/dL (ref 11.1–15.9)
MCH: 31.6 pg (ref 26.6–33.0)
MCHC: 32.6 g/dL (ref 31.5–35.7)
MCV: 97 fL (ref 79–97)
Platelets: 233 10*3/uL (ref 150–450)
RBC: 4.71 x10E6/uL (ref 3.77–5.28)
RDW: 11 % — ABNORMAL LOW (ref 11.7–15.4)
WBC: 8.1 10*3/uL (ref 3.4–10.8)

## 2024-02-16 LAB — TSH: TSH: 1.26 u[IU]/mL (ref 0.450–4.500)

## 2024-02-16 LAB — HEMOGLOBIN A1C
Est. average glucose Bld gHb Est-mCnc: 108 mg/dL
Hgb A1c MFr Bld: 5.4 % (ref 4.8–5.6)

## 2024-02-17 ENCOUNTER — Other Ambulatory Visit

## 2024-02-17 DIAGNOSIS — I10 Essential (primary) hypertension: Secondary | ICD-10-CM

## 2024-02-17 DIAGNOSIS — F0390 Unspecified dementia without behavioral disturbance: Secondary | ICD-10-CM

## 2024-02-17 DIAGNOSIS — E785 Hyperlipidemia, unspecified: Secondary | ICD-10-CM

## 2024-02-21 ENCOUNTER — Ambulatory Visit: Payer: Self-pay | Admitting: Family Medicine

## 2024-02-25 ENCOUNTER — Other Ambulatory Visit: Payer: Self-pay | Admitting: *Deleted

## 2024-02-25 DIAGNOSIS — N952 Postmenopausal atrophic vaginitis: Secondary | ICD-10-CM

## 2024-02-25 MED ORDER — ESTRADIOL 0.1 MG/GM VA CREA
TOPICAL_CREAM | VAGINAL | 2 refills | Status: AC
Start: 2024-02-25 — End: ?

## 2024-04-07 ENCOUNTER — Encounter: Payer: Self-pay | Admitting: Family Medicine

## 2024-04-12 ENCOUNTER — Encounter: Payer: Self-pay | Admitting: Family Medicine

## 2024-04-12 ENCOUNTER — Ambulatory Visit: Admitting: Family Medicine

## 2024-04-12 VITALS — BP 129/67 | HR 84 | Ht 62.0 in | Wt 138.8 lb

## 2024-04-12 DIAGNOSIS — I10 Essential (primary) hypertension: Secondary | ICD-10-CM

## 2024-04-12 DIAGNOSIS — F0390 Unspecified dementia without behavioral disturbance: Secondary | ICD-10-CM | POA: Diagnosis not present

## 2024-04-12 DIAGNOSIS — Z0000 Encounter for general adult medical examination without abnormal findings: Secondary | ICD-10-CM

## 2024-04-12 DIAGNOSIS — E871 Hypo-osmolality and hyponatremia: Secondary | ICD-10-CM | POA: Diagnosis not present

## 2024-04-12 DIAGNOSIS — E785 Hyperlipidemia, unspecified: Secondary | ICD-10-CM

## 2024-04-12 NOTE — Patient Instructions (Addendum)
 It was nice to see you today,  We addressed the following topics today: - the medication for dementia would be Namenda.  I have included some information about it.   - It is still appropriate to get mammograms at your age.   - You do not need to continue getting yearly gynecologic exams if you do not have any gynecological symptoms.    Have a great day,  Rolan Slain, MD

## 2024-04-12 NOTE — Progress Notes (Unsigned)
 Established Patient Office Visit  Subjective   Patient ID: Brenda [name], female    DOB: Jan 19, 1946  Age: 78 y.o. MRN: [id number]  No chief complaint on file.   HPI  Subjective - No new concerns today. - Inquires about the need for a mammogram. Last mammogram was in July 2024. Reports no family history of breast problems. - Inquires about the need for an annual GYN checkup. Last GYN visit was 2-2.5 years ago. Reports no bleeding or urinary issues. - Previously noted bump on the right chest has resolved spontaneously. - Discussed dementia. Reports feeling that her head is jammed with stuff, with some mild pressure but no pain. Has a history of chronic dizziness. Reports a prior trial of donepezil which was not tolerated due to worsening dizziness. Interested in learning more about other options.  Medications Currently taking omeprazole and estradiol  vaginal cream. Has been on the estradiol  cream for years.  PMH, PSH, FH, Social Hx PMH: Dementia, chronic dizziness, history of low sodium, mildly elevated cholesterol. FH: No family history of breast problems reported. PSH: No surgeries mentioned. Social Hx: Sees primary care physician approximately once a year, with some virtual visits.  ROS Constitutional: No new complaints. GU: Denies bleeding or dysuria. MSK: No new complaints. Neurological: Reports feeling of head being full, occasional mild pressure without pain. Denies significant dizziness currently. Denies headache. Psychiatric: No new complaints.  Objective Vitals: BP 129/x, noted to be significantly improved from previous readings of 160s. PULM: Lungs clear to auscultation bilaterally.  Assessment and Plan Dementia - Presents for follow-up and discussion of dementia management. Reports subjective feeling of head being full. Has a history of intolerance to donepezil due to worsening dizziness. CT scan from 2023 showed age-related brain atrophy. - Provided education  on dementia medications, specifically donepezil and Namenda (memantine), including mechanism of slowing progression and potential side effects such as dizziness and nausea. - Will provide written information on Namenda for review. - Plan to consider starting Namenda if she decides to proceed after review. - Follow up in 6 months, or sooner if medication is initiated.  Hypertension - Blood pressure is well-controlled today at 129/x, a significant improvement from prior readings in the 160s. Does not check BP at home. - Continue to monitor blood pressure at office visits. - No medication changes needed at this time.  Hyperlipidemia - Recent labs show persistently mildly elevated cholesterol. - Given age, statin therapy is not recommended. - Counseled on dietary modifications, including limiting red meat and dairy to reduce saturated fat intake.  Hyponatremia - Recent labs show mild hyponatremia, which is chronic and stable compared to past results. Not taking any medications known to cause hyponatremia. - Continue to monitor with labs every 6-12 months. - No changes to management at this time.  Health Maintenance - Discussed need for ongoing mammograms. Last performed in July 2024. Advised that screening can continue. She will schedule an appointment with the breast center. - Discussed need for GYN follow-up. Last visit 2-2.5 years ago. Advised that routine annual pelvic exams are not necessary in the absence of symptoms.    The 10-year ASCVD risk score (Arnett DK, et al., 2019) is: 22.2%  Health Maintenance Due  Topic Date Due   Medicare Annual Wellness (AWV)  11/23/2022   COVID-19 Vaccine (8 - Pfizer risk 2024-25 season) 11/13/2023   INFLUENZA VACCINE  03/25/2024      Objective:     LMP 08/25/1992  {Vitals History (Optional):23777}  Physical Exam  No results found for any visits on 04/12/24.      Assessment & Plan:   There are no diagnoses linked to this  encounter.   No follow-ups on file.    Toribio MARLA Slain, MD

## 2024-04-13 DIAGNOSIS — E871 Hypo-osmolality and hyponatremia: Secondary | ICD-10-CM | POA: Insufficient documentation

## 2024-04-13 DIAGNOSIS — Z0000 Encounter for general adult medical examination without abnormal findings: Secondary | ICD-10-CM | POA: Insufficient documentation

## 2024-04-13 NOTE — Assessment & Plan Note (Signed)
-   Discussed need for ongoing mammograms. Last performed in July 2024. Advised that screening can continue. She will schedule an appointment with the breast center. - Discussed need for GYN follow-up. Last visit 2-2.5 years ago. Advised that routine annual pelvic exams are not necessary in the absence of symptoms.

## 2024-04-13 NOTE — Assessment & Plan Note (Signed)
-   Recent labs show mild hyponatremia, which is chronic and stable compared to past results. Not taking any medications known to cause hyponatremia. - Continue to monitor with labs every 6-12 months. - No changes to management at this time.

## 2024-04-13 NOTE — Assessment & Plan Note (Signed)
-   Presents for follow-up and discussion of dementia management. Reports subjective feeling of head being full. Has a history of intolerance to donepezil due to worsening dizziness. CT scan from 2023 showed age-related brain atrophy. - Provided education on dementia medications, specifically donepezil and Namenda (memantine), including mechanism of slowing progression and potential side effects such as dizziness and nausea. - Will provide written information on Namenda for review. - Plan to consider starting Namenda if she decides to proceed after review. - Follow up in 6 months, or sooner if medication is initiated.

## 2024-04-13 NOTE — Assessment & Plan Note (Signed)
-   Blood pressure is well-controlled today at 129/67, a significant improvement from prior readings in the 160s. Does not check BP at home. - Continue to monitor blood pressure at office visits. - No medication changes needed at this time.

## 2024-04-13 NOTE — Assessment & Plan Note (Signed)
-   Recent labs show persistently mildly elevated cholesterol. - Counseled on dietary modifications, including limiting red meat and dairy to reduce saturated fat intake.
